# Patient Record
Sex: Female | Born: 1945 | Race: White | Hispanic: No | Smoking: Current every day smoker
Health system: Southern US, Community
[De-identification: ages and names within clinical notes are randomized; demographics above are authoritative.]

## PROBLEM LIST (undated history)

## (undated) DIAGNOSIS — F319 Bipolar disorder, unspecified: Secondary | ICD-10-CM

## (undated) DIAGNOSIS — E119 Type 2 diabetes mellitus without complications: Secondary | ICD-10-CM

## (undated) DIAGNOSIS — R32 Unspecified urinary incontinence: Secondary | ICD-10-CM

## (undated) DIAGNOSIS — F329 Major depressive disorder, single episode, unspecified: Secondary | ICD-10-CM

## (undated) DIAGNOSIS — F32A Depression, unspecified: Secondary | ICD-10-CM

## (undated) DIAGNOSIS — J441 Chronic obstructive pulmonary disease with (acute) exacerbation: Secondary | ICD-10-CM

## (undated) DIAGNOSIS — F0390 Unspecified dementia without behavioral disturbance: Secondary | ICD-10-CM

## (undated) DIAGNOSIS — F419 Anxiety disorder, unspecified: Secondary | ICD-10-CM

## (undated) HISTORY — DX: Unspecified dementia, unspecified severity, without behavioral disturbance, psychotic disturbance, mood disturbance, and anxiety: F03.90

## (undated) HISTORY — PX: CERVIX SURGERY: SHX593

## (undated) HISTORY — DX: Depression, unspecified: F32.A

## (undated) HISTORY — DX: Major depressive disorder, single episode, unspecified: F32.9

## (undated) HISTORY — DX: Unspecified urinary incontinence: R32

## (undated) HISTORY — DX: Type 2 diabetes mellitus without complications: E11.9

## (undated) HISTORY — DX: Anxiety disorder, unspecified: F41.9

## (undated) HISTORY — DX: Chronic obstructive pulmonary disease with (acute) exacerbation: J44.1

## (undated) HISTORY — DX: Bipolar disorder, unspecified: F31.9

---

## 2005-01-16 ENCOUNTER — Emergency Department: Payer: Self-pay | Admitting: Emergency Medicine

## 2005-01-16 IMAGING — CT CT HEAD WITHOUT CONTRAST
2 series · 16 of 30 positions shown, 20 images · non-contrast
Comparison: none

REASON FOR EXAM: Headache
COMMENTS:

[Series 2: without · axial · non-contrast · 0.41mm/px · z∈[-118,-3]mm · 13 of 27 slices shown, 17 images]
[im 2/27  brain]
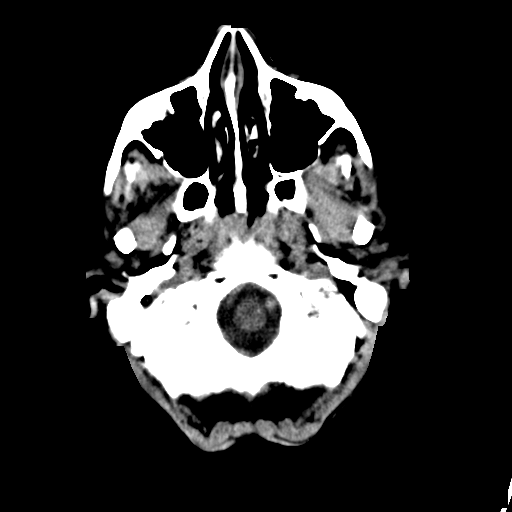
[im 2/27  bone]
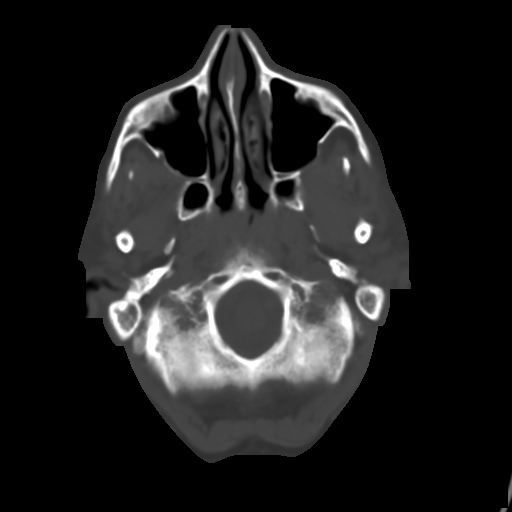
[im 4/27  brain]
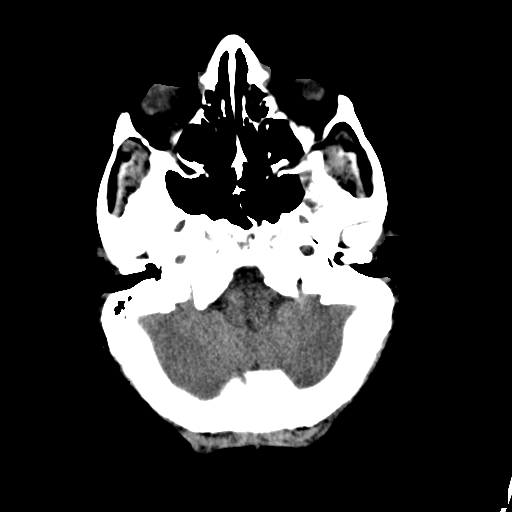
[im 6/27  brain]
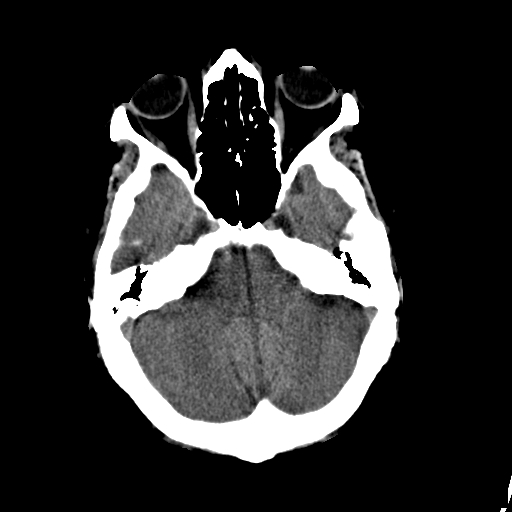
[im 8/27  brain]
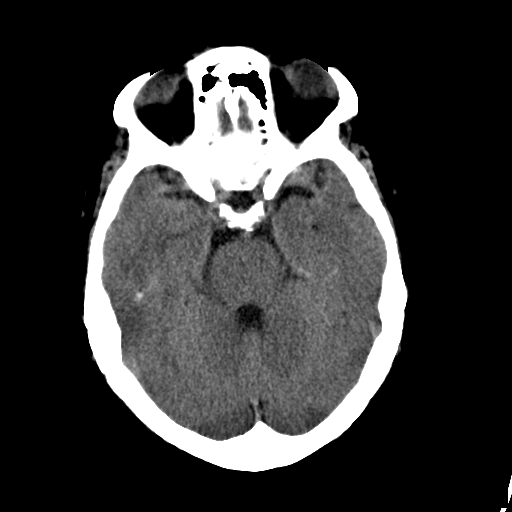
[im 10/27  brain]
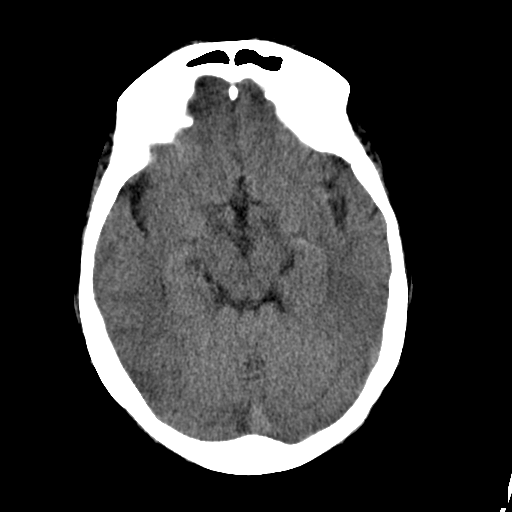
[im 10/27  bone]
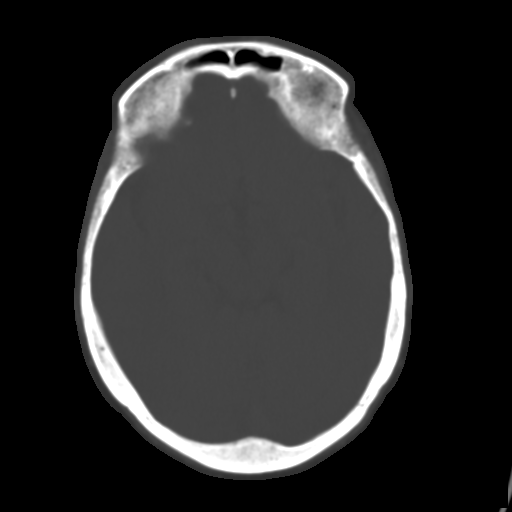
[im 12/27  brain]
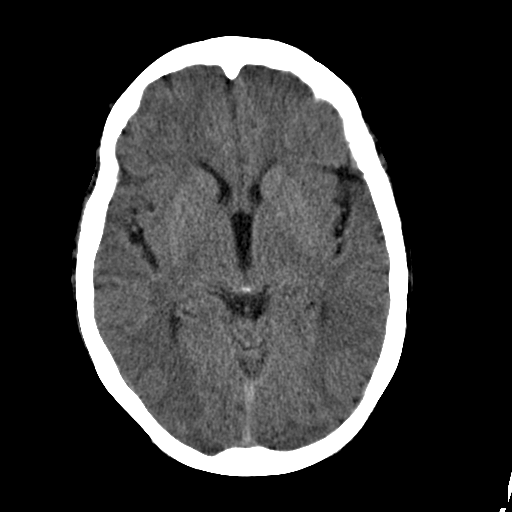
[im 14/27  brain]
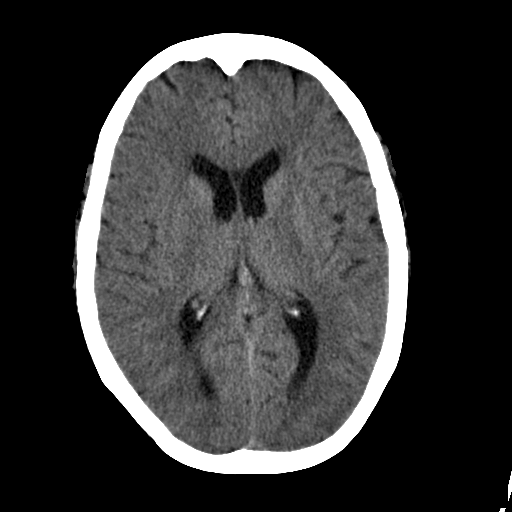
[im 15/27  brain]
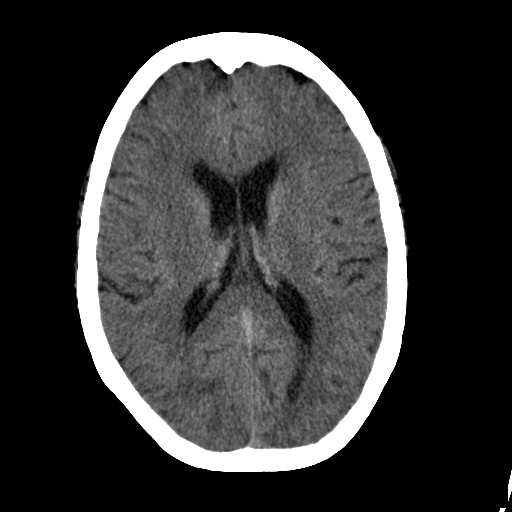
[im 17/27  brain]
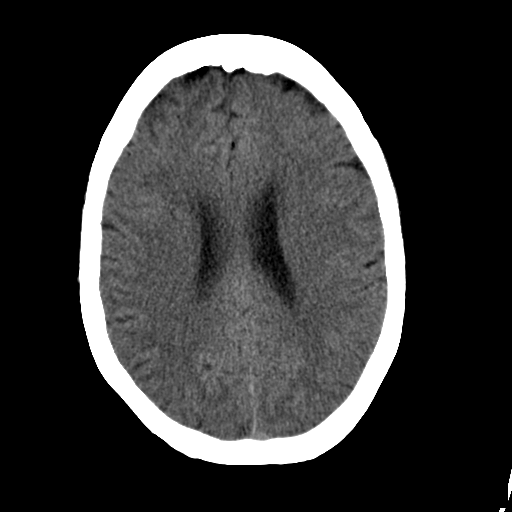
[im 17/27  bone]
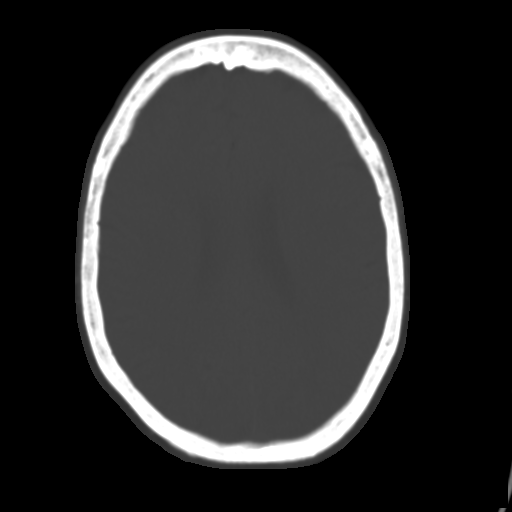
[im 19/27  brain]
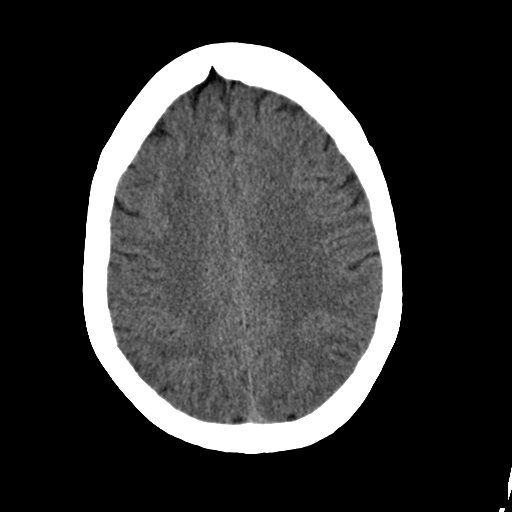
[im 21/27  brain]
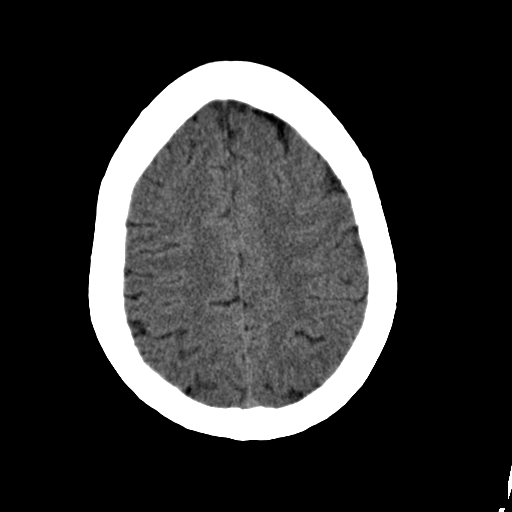
[im 23/27  brain]
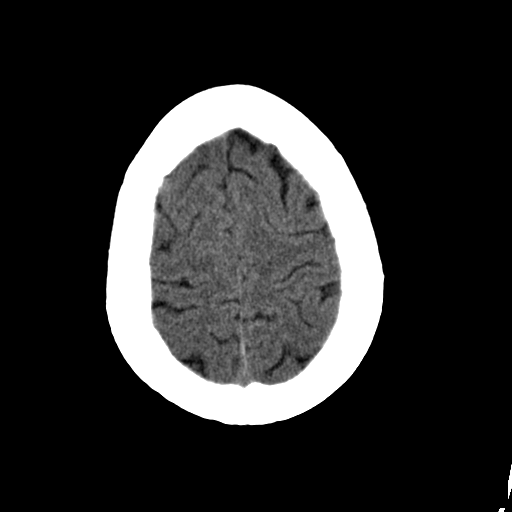
[im 25/27  brain]
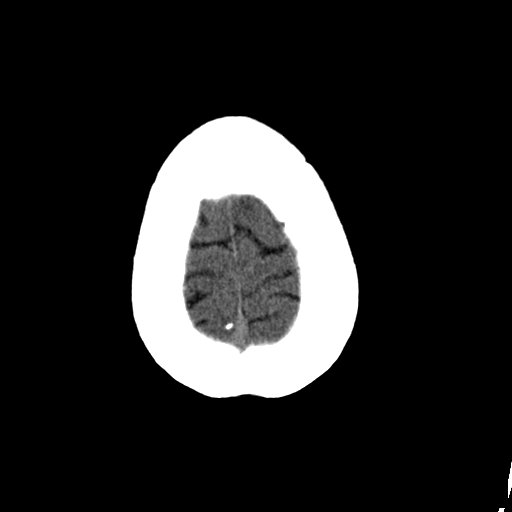
[im 25/27  bone]
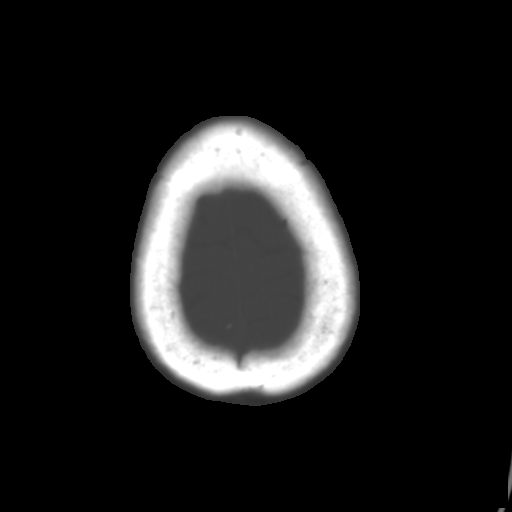

[Series 3: bone · axial · 0.41mm/px · z∈[-118,-78]mm · 3 of 27 slices shown]
[im 2/27  bone]
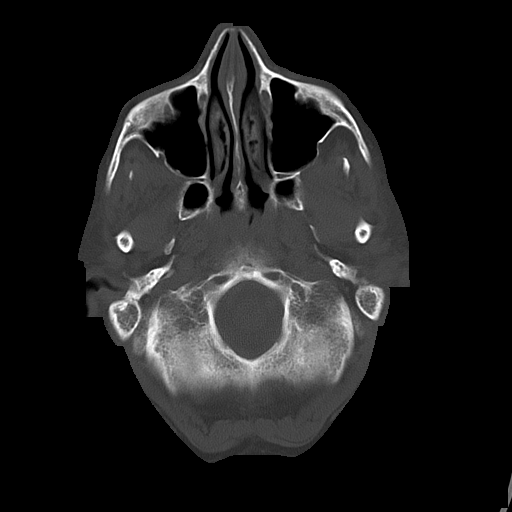
[im 6/27  bone]
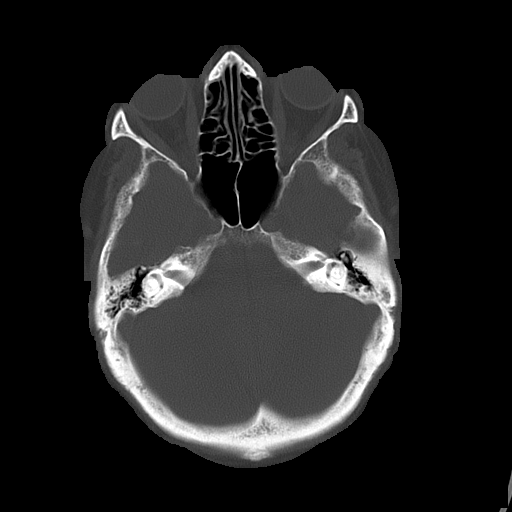
[im 10/27  bone]
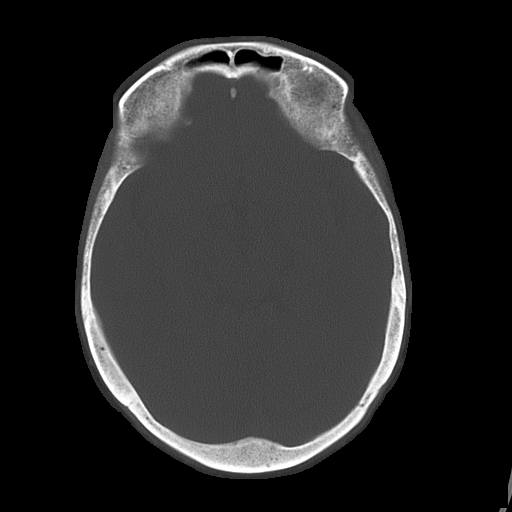

[16 of 30 positions shown; findings below may reference images not displayed]

PROCEDURE:     CT  - CT HEAD WITHOUT CONTRAST  - [DATE] [DATE]

RESULT:          There is no evidence of intraaxial nor extraaxial fluid
collections nor evidence of acute hemorrhage.  No secondary signs are
appreciated to suggest mass effect, subacute or chronic infarction.  The
visualized bony skeleton is unremarkable.
IMPRESSION: 1.     No evidence of acute abnormalities.
2.     Dr. AAITTEY, of the emergency department, was informed of these findings
at the time of the initial interpretation.

## 2005-06-09 ENCOUNTER — Emergency Department: Payer: Self-pay | Admitting: Emergency Medicine

## 2005-06-10 ENCOUNTER — Emergency Department: Payer: Self-pay | Admitting: Internal Medicine

## 2007-10-25 ENCOUNTER — Ambulatory Visit: Payer: Self-pay | Admitting: Internal Medicine

## 2007-11-08 ENCOUNTER — Ambulatory Visit: Payer: Self-pay | Admitting: Internal Medicine

## 2008-04-24 ENCOUNTER — Ambulatory Visit: Payer: Self-pay | Admitting: Internal Medicine

## 2009-04-29 ENCOUNTER — Ambulatory Visit: Payer: Self-pay | Admitting: Internal Medicine

## 2014-07-05 ENCOUNTER — Other Ambulatory Visit: Payer: Self-pay | Admitting: Nurse Practitioner

## 2014-07-05 DIAGNOSIS — Z1231 Encounter for screening mammogram for malignant neoplasm of breast: Secondary | ICD-10-CM

## 2014-07-24 ENCOUNTER — Ambulatory Visit
Admission: RE | Admit: 2014-07-24 | Discharge: 2014-07-24 | Disposition: A | Discharge status: Home / Self Care | Payer: Medicare Other | Source: Ambulatory Visit | Attending: Nurse Practitioner | Admitting: Nurse Practitioner

## 2014-07-24 ENCOUNTER — Other Ambulatory Visit: Payer: Self-pay | Admitting: Nurse Practitioner

## 2014-07-24 DIAGNOSIS — Z1231 Encounter for screening mammogram for malignant neoplasm of breast: Secondary | ICD-10-CM | POA: Diagnosis present

## 2014-09-17 ENCOUNTER — Encounter: Payer: Self-pay | Admitting: Psychiatry

## 2014-09-17 ENCOUNTER — Ambulatory Visit (INDEPENDENT_AMBULATORY_CARE_PROVIDER_SITE_OTHER): Payer: Medicare Other | Admitting: Psychiatry

## 2014-09-17 VITALS — BP 134/80 | HR 94 | Temp 97.3°F | Ht 61.0 in | Wt 108.8 lb

## 2014-09-17 DIAGNOSIS — F3130 Bipolar disorder, current episode depressed, mild or moderate severity, unspecified: Secondary | ICD-10-CM | POA: Diagnosis not present

## 2014-09-17 MED ORDER — BUPROPION HCL ER (XL) 150 MG PO TB24: 150.0000 mg | ORAL_TABLET | ORAL | Status: DC
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
## 2014-10-15 ENCOUNTER — Ambulatory Visit: Payer: Self-pay | Admitting: Psychiatry

## 2014-10-25 ENCOUNTER — Ambulatory Visit: Payer: Self-pay | Admitting: Psychiatry

## 2014-10-30 ENCOUNTER — Ambulatory Visit (INDEPENDENT_AMBULATORY_CARE_PROVIDER_SITE_OTHER): Payer: Medicare Other | Admitting: Psychiatry

## 2014-10-30 ENCOUNTER — Encounter: Payer: Self-pay | Admitting: Psychiatry

## 2014-10-30 VITALS — BP 110/86 | HR 97 | Temp 97.5°F | Ht 61.0 in | Wt 110.0 lb

## 2014-10-30 DIAGNOSIS — F3130 Bipolar disorder, current episode depressed, mild or moderate severity, unspecified: Secondary | ICD-10-CM

## 2014-10-30 MED ORDER — BUPROPION HCL ER (XL) 150 MG PO TB24: 150.0000 mg | ORAL_TABLET | ORAL | Status: DC

## 2014-10-30 NOTE — Progress Notes (Signed)
BH MD/PA/NP OP Progress Note  10/30/2014 2:26 PM Miranda Ryan  MRN:  [id number]  Subjective:  Patient returns for follow-up of a past diagnoses of bipolar disorder most recent episode depressed. However per my initial evaluation noted appears years since she's had any hospitalizations or issues with mania. She presented at the initial plan with her daughter and her daughter and patient endorsed depression. Patient returns with her son Abe People. Patient initially stated she felt the same. She rated her depression all as a 3 out of 10 in intensity. And she stated this is probably what it was like when she came in for initial evaluation. However when I bring the son in with her permission he indicates he feels like over the past 2 weeks she's been more up and about. He's noted she's been a little more conversant. He states he spoke in more with her in the past 2 weeks than he did the whole entire previous month. Patient states she feels like her energy is good and she is sleeping well. She denies any side effects from the medication. Chief Complaint: None Chief Complaint    Follow-up; Medication Refill     Visit Diagnosis:     ICD-9-CM ICD-10-CM   1. Bipolar I disorder, most recent episode depressed 296.50 F31.30 buPROPion (WELLBUTRIN XL) 150 MG 24 hr tablet    Past Medical History:  Past Medical History  Diagnosis Date  . Bipolar disorder   . Anxiety   . Depression   . Diabetes mellitus, type II     Past Surgical History  Procedure Laterality Date  . Cervix surgery     Family History:  Family History  Problem Relation Age of Onset  . Liver cancer Mother   . Heart attack Father   . Heart disease Sister   . Heart disease Brother   . Heart disease Brother   . Heart disease Brother    Social History:  Social History   Social History  . Marital Status: Married    Spouse Name: N/A  . Number of Children: N/A  . Years of Education: N/A   Social History Main Topics  . Smoking  status: Current Every Day Smoker -- 1.00 packs/day    Types: Cigarettes    Start date: 09/16/1964  . Smokeless tobacco: Never Used  . Alcohol Use: No  . Drug Use: No  . Sexual Activity: No   Other Topics Concern  . None   Social History Narrative   Additional History:   Assessment:   Musculoskeletal: Strength & Muscle Tone: within normal limits Gait & Station: normal Patient leans: N/A  Psychiatric Specialty Exam: HPI  Review of Systems  Psychiatric/Behavioral: Positive for depression. Negative for suicidal ideas, hallucinations, memory loss and substance abuse. The patient is not nervous/anxious and does not have insomnia.     Blood pressure 110/86, pulse 97, temperature 97.5 F (36.4 C), temperature source Tympanic, height 5\' 1"  (1.549 m), weight 110 lb (49.896 kg), SpO2 96 %.Body mass index is 20.8 kg/(m^2).  General Appearance: Fairly Groomed  Eye Contact:  Good  Speech:  Normal Rate  Volume:  Normal  Mood:  Good  Affect:  Bright, smiling  Thought Process:  Linear and Logical  Orientation:  Full (Time, Place, and Person)  Thought Content:  Negative  Suicidal Thoughts:  No  Homicidal Thoughts:  No  Memory:  Immediate;   Fair Recent;   Fair Remote;   Good  Judgement:  Good  Insight:  Good  Psychomotor Activity:  Negative  Concentration:  Good  Recall:  Good  Fund of Knowledge: Good  Language: Good  Akathisia:  Negative  Handed:  Right unknown  AIMS (if indicated):  N/A  Assets:  Communication Skills Desire for Improvement Social Support  ADL's:  Intact  Cognition: WNL  Sleep:  good   Is the patient at risk to self?  No. Has the patient been a risk to self in the past 6 months?  No. Has the patient been a risk to self within the distant past?  No. Is the patient a risk to others?  No. Has the patient been a risk to others in the past 6 months?  No. Has the patient been a risk to others within the distant past?  No.  Current Medications: Current  Outpatient Prescriptions  Medication Sig Dispense Refill  . buPROPion (WELLBUTRIN XL) 150 MG 24 hr tablet Take 1 tablet (150 mg total) by mouth every morning. 30 tablet 1  . JANUVIA 25 MG tablet   0   No current facility-administered medications for this visit.    Medical Decision Making:  Established Problem, Stable/Improving (1)  Treatment Plan Summary:Medication management and Plan At this time patient reports she is feeling okay. Son provides some collateral that there may be some improvement. Addition she does seem bright and smiling at this appointment. We will continue the patient on Wellbutrin XL 150 mg in the morning without any changes. She'll follow up in 1 month and we can continue to assess for any adjustments.   Faith Rogue 10/30/2014, 2:26 PM

## 2014-11-25 ENCOUNTER — Ambulatory Visit: Payer: Self-pay | Admitting: Psychiatry

## 2014-12-06 ENCOUNTER — Ambulatory Visit (INDEPENDENT_AMBULATORY_CARE_PROVIDER_SITE_OTHER): Payer: Medicare Other | Admitting: Psychiatry

## 2014-12-06 ENCOUNTER — Encounter: Payer: Self-pay | Admitting: Psychiatry

## 2014-12-06 VITALS — BP 118/84 | HR 84 | Temp 97.9°F | Ht 61.0 in | Wt 112.8 lb

## 2014-12-06 DIAGNOSIS — F3130 Bipolar disorder, current episode depressed, mild or moderate severity, unspecified: Secondary | ICD-10-CM

## 2014-12-06 MED ORDER — BUPROPION HCL ER (XL) 150 MG PO TB24: 150.0000 mg | ORAL_TABLET | ORAL | Status: DC

## 2014-12-06 NOTE — Progress Notes (Signed)
BH MD/PA/NP OP Progress Note  12/06/2014 10:34 AM Miranda Ryan  MRN:  [id number]  Subjective:  Patient returns for follow-up of a past diagnoses of bipolar disorder most recent episode depressed. She presents alone today on just the past appointments where she had her son and/or daughter with her. She states overall things are going good and her mood is good. She states she spends time around the house cleaning. She states she does not do much otherwise. She states that her appetite and her sleep are good. She states she continues take her medication.   She was able to joke and laugh in the appointment. When asked about what she does she indicates she does take care of her dogs. She states they are beagles and she joked and asked writer if I wanted 1. Chief Complaint: None Chief Complaint    Follow-up; Medication Reaction     Visit Diagnosis:     ICD-9-CM ICD-10-CM   1. Bipolar I disorder, most recent episode depressed ([place]) 296.50 F31.30 buPROPion (WELLBUTRIN XL) 150 MG 24 hr tablet    Past Medical History:  Past Medical History  Diagnosis Date  . Bipolar disorder ([place])   . Anxiety   . Depression   . Diabetes mellitus, type II [facility])     Past Surgical History  Procedure Laterality Date  . Cervix surgery     Family History:  Family History  Problem Relation Age of Onset  . Liver cancer Mother   . Heart attack Father   . Heart disease Sister   . Heart disease Brother   . Heart disease Brother   . Heart disease Brother    Social History:  Social History   Social History  . Marital Status: Married    Spouse Name: N/A  . Number of Children: N/A  . Years of Education: N/A   Social History Main Topics  . Smoking status: Current Every Day Smoker -- 1.00 packs/day    Types: Cigarettes    Start date: 09/16/1964  . Smokeless tobacco: Never Used  . Alcohol Use: No  . Drug Use: No  . Sexual Activity: No   Other Topics Concern  . None   Social History Narrative    Additional History:   Assessment:   Musculoskeletal: Strength & Muscle Tone: within normal limits Gait & Station: normal Patient leans: N/A  Psychiatric Specialty Exam: HPI  Review of Systems  Psychiatric/Behavioral: Negative for depression, suicidal ideas, hallucinations, memory loss and substance abuse. The patient is not nervous/anxious and does not have insomnia.   All other systems reviewed and are negative.   Blood pressure 118/84, pulse 119, temperature 97.9 F (36.6 C), temperature source Tympanic, height 5\' 1"  (1.549 m), weight 112 lb 12.8 oz (51.166 kg), SpO2 98 %.Body mass index is 21.32 kg/(m^2).  General Appearance: Fairly Groomed  Eye Contact:  Good  Speech:  Normal Rate  Volume:  Normal  Mood:  Good  Affect:  Bright, smiling  Thought Process:  Linear and Logical  Orientation:  Full (Time, Place, and Person)  Thought Content:  Negative  Suicidal Thoughts:  No  Homicidal Thoughts:  No  Memory:  Immediate;   Fair Recent;   Fair Remote;   Good  Judgement:  Good  Insight:  Good  Psychomotor Activity:  Negative  Concentration:  Good  Recall:  Good  Fund of Knowledge: Good  Language: Good  Akathisia:  Negative  Handed:  Right unknown  AIMS (if indicated):  N/A  Assets:  Communication Skills Desire for Improvement Social Support  ADL's:  Intact  Cognition: WNL  Sleep:  good  Patient was able to state it was "October". She was able to state it was the middle of October. She was aware it was "Friday." She was able to immediately name 3 things and recall all 3 after 3 minutes. Is the patient at risk to self?  No. Has the patient been a risk to self in the past 6 months?  No. Has the patient been a risk to self within the distant past?  No. Is the patient a risk to others?  No. Has the patient been a risk to others in the past 6 months?  No. Has the patient been a risk to others within the distant past?  No.  Current Medications: Current Outpatient  Prescriptions  Medication Sig Dispense Refill  . buPROPion (WELLBUTRIN XL) 150 MG 24 hr tablet Take 1 tablet (150 mg total) by mouth every morning. 30 tablet 3  . JANUVIA 25 MG tablet   0   No current facility-administered medications for this visit.    Medical Decision Making:  Established Problem, Stable/Improving (1)  Treatment Plan Summary:Medication management and Plan  Bipolar disorder, most recent episode depressed. Also last appointment patient seems to report a good mood. There is no signs or symptoms of mania or hypomania. We will continue the patient on Wellbutrin XL 150 mg in the morning without any changes. She'll follow up in 1 month and we can continue to assess for any adjustments.   Faith Rogue 12/06/2014, 10:34 AM

## 2015-03-05 ENCOUNTER — Encounter: Payer: Self-pay | Admitting: Psychiatry

## 2015-03-05 ENCOUNTER — Ambulatory Visit (INDEPENDENT_AMBULATORY_CARE_PROVIDER_SITE_OTHER): Payer: Medicare Other | Admitting: Psychiatry

## 2015-03-05 VITALS — BP 110/76 | HR 106 | Temp 97.5°F | Ht 61.0 in | Wt 118.2 lb

## 2015-03-05 DIAGNOSIS — F3130 Bipolar disorder, current episode depressed, mild or moderate severity, unspecified: Secondary | ICD-10-CM

## 2015-03-05 MED ORDER — BUPROPION HCL ER (XL) 150 MG PO TB24: 150.0000 mg | ORAL_TABLET | ORAL | Status: DC

## 2015-03-05 NOTE — Progress Notes (Signed)
BH MD/PA/NP OP Progress Note  03/05/2015 11:07 AM Miranda Ryan  MRN:  [id number]  Subjective:  Patient returns for follow-up of a past diagnoses of bipolar disorder most recent episode depressed. She comes in to the office unaccompanied today. She states things are going well. She states that she does not do much but take care of her 4 dogs. She states she is able to enjoy that. She states her sleep is about 5 or 6 hours a night and that her appetite has been good. She denies any disturbing thoughts and adamantly denies any suicidal ideation.  He may be medication compliance. She states she's not taking any medications. When I reminded her also prescribing her medication for depression she indicated she ran out. I reviewed the prescriptions and she should've had enough medications through February per the last prescription written in October 2016. I asked her who helps her fill her medications and she stated her son or her daughter.  Chief Complaint: None Chief Complaint    Follow-up; Medication Refill     Visit Diagnosis:     ICD-9-CM ICD-10-CM   1. Bipolar I disorder, most recent episode depressed ([place]) 296.50 F31.30 buPROPion (WELLBUTRIN XL) 150 MG 24 hr tablet    Past Medical History:  Past Medical History  Diagnosis Date  . Bipolar disorder ([place])   . Anxiety   . Depression   . Diabetes mellitus, type II [facility])     Past Surgical History  Procedure Laterality Date  . Cervix surgery     Family History:  Family History  Problem Relation Age of Onset  . Liver cancer Mother   . Heart attack Father   . Heart disease Sister   . Heart disease Brother   . Heart disease Brother   . Heart disease Brother    Social History:  Social History   Social History  . Marital Status: Married    Spouse Name: N/A  . Number of Children: N/A  . Years of Education: N/A   Social History Main Topics  . Smoking status: Current Every Day Smoker -- 1.00 packs/day    Types: Cigarettes   Start date: 09/16/1964  . Smokeless tobacco: Never Used  . Alcohol Use: No  . Drug Use: No  . Sexual Activity: No   Other Topics Concern  . None   Social History Narrative   Additional History:   Assessment:   Musculoskeletal: Strength & Muscle Tone: within normal limits Gait & Station: normal Patient leans: N/A  Psychiatric Specialty Exam: HPI  Review of Systems  Psychiatric/Behavioral: Negative for depression, suicidal ideas, hallucinations, memory loss and substance abuse. The patient is not nervous/anxious and does not have insomnia.   All other systems reviewed and are negative.   Blood pressure 110/76, pulse 106, temperature 97.5 F (36.4 C), temperature source Tympanic, height 5\' 1"  (1.549 m), weight 118 lb 3.2 oz (53.615 kg), SpO2 99 %.Body mass index is 22.35 kg/(m^2).  General Appearance: Fairly Groomed  Eye Contact:  Good  Speech:  Normal Rate  Volume:  Normal  Mood:  Good  Affect:  Bright, smiling she did seem to joke when I explained my departure from the practice responding "no you're not"   Thought Process:  Linear and Logical  Orientation:  Full (Time, Place, and Person)  Thought Content:  Negative  Suicidal Thoughts:  No  Homicidal Thoughts:  No  Memory:  Immediate;   Fair Recent;   Fair Remote;   Good  Judgement:  Good  Insight:  Good  Psychomotor Activity:  Negative  Concentration:  Good  Recall:  Good  Fund of Knowledge: Good  Language: Good  Akathisia:  Negative  Handed:  Right unknown  AIMS (if indicated):  N/A  Assets:  Communication Skills Desire for Improvement Social Support  ADL's:  Intact  Cognition: WNL  Sleep:  good  Patient was able to state it was "October". She was able to state it was the middle of October. She was aware it was "Friday." She was able to immediately name 3 things and recall all 3 after 3 minutes. Is the patient at risk to self?  No. Has the patient been a risk to self in the past 6 months?  No. Has the  patient been a risk to self within the distant past?  No. Is the patient a risk to others?  No. Has the patient been a risk to others in the past 6 months?  No. Has the patient been a risk to others within the distant past?  No.  Current Medications: Current Outpatient Prescriptions  Medication Sig Dispense Refill  . buPROPion (WELLBUTRIN XL) 150 MG 24 hr tablet Take 1 tablet (150 mg total) by mouth every morning. 30 tablet 4  . JANUVIA 25 MG tablet Reported on 03/05/2015  0   No current facility-administered medications for this visit.    Medical Decision Making:  Established Problem, Stable/Improving (1)  Treatment Plan Summary:Medication management and Plan  Bipolar disorder, most recent episode depressed. Also last appointment patient seems to report a good mood. There is no signs or symptoms of mania or hypomania. We will continue the patient on Wellbutrin XL 150 mg in the morning without any changes. However does appear the patient has now been off of the medication for some period of time. It may be that she's not aware of refills. She indicated she has her daughter in the car and thus I printed her after visit summary and explained on it that she has medication for her at her pharmacy.  Patient my departure next month and that she would be able to follow-up with another psychiatrist in this clinic. I've instructed her to make her next appointment for 3 months as she has been stable and we are not changing medications. Encouraged call any questions or concerns prior to her next appointment.  Faith Rogue 03/05/2015, 11:07 AM

## 2015-06-03 ENCOUNTER — Ambulatory Visit: Payer: Medicare Other | Admitting: Psychiatry

## 2015-06-11 ENCOUNTER — Ambulatory Visit (INDEPENDENT_AMBULATORY_CARE_PROVIDER_SITE_OTHER): Payer: Medicare Other | Admitting: Psychiatry

## 2015-11-29 ENCOUNTER — Encounter: Payer: Self-pay | Admitting: Emergency Medicine

## 2015-11-29 ENCOUNTER — Emergency Department: Payer: Medicare Other

## 2015-11-29 ENCOUNTER — Emergency Department
Admission: EM | Admit: 2015-11-29 | Discharge: 2015-11-29 | Disposition: A | Discharge status: Home / Self Care | Payer: Medicare Other | Attending: Student in an Organized Health Care Education/Training Program | Admitting: Student in an Organized Health Care Education/Training Program

## 2015-11-29 DIAGNOSIS — F17210 Nicotine dependence, cigarettes, uncomplicated: Secondary | ICD-10-CM | POA: Insufficient documentation

## 2015-11-29 DIAGNOSIS — E119 Type 2 diabetes mellitus without complications: Secondary | ICD-10-CM | POA: Insufficient documentation

## 2015-11-29 DIAGNOSIS — F17200 Nicotine dependence, unspecified, uncomplicated: Secondary | ICD-10-CM

## 2015-11-29 DIAGNOSIS — R05 Cough: Secondary | ICD-10-CM | POA: Diagnosis present

## 2015-11-29 DIAGNOSIS — J209 Acute bronchitis, unspecified: Secondary | ICD-10-CM | POA: Insufficient documentation

## 2015-11-29 IMAGING — CR DG CHEST 2V
1 series · 2 of 2 positions shown · non-contrast
Comparison: None.

CLINICAL DATA: Cough.  Smoker.

EXAM:
CHEST  2 VIEW

[Series 1: dg chest 2 view · 0.14mm/px · 2 of 2 slices shown]
[im 1/2]
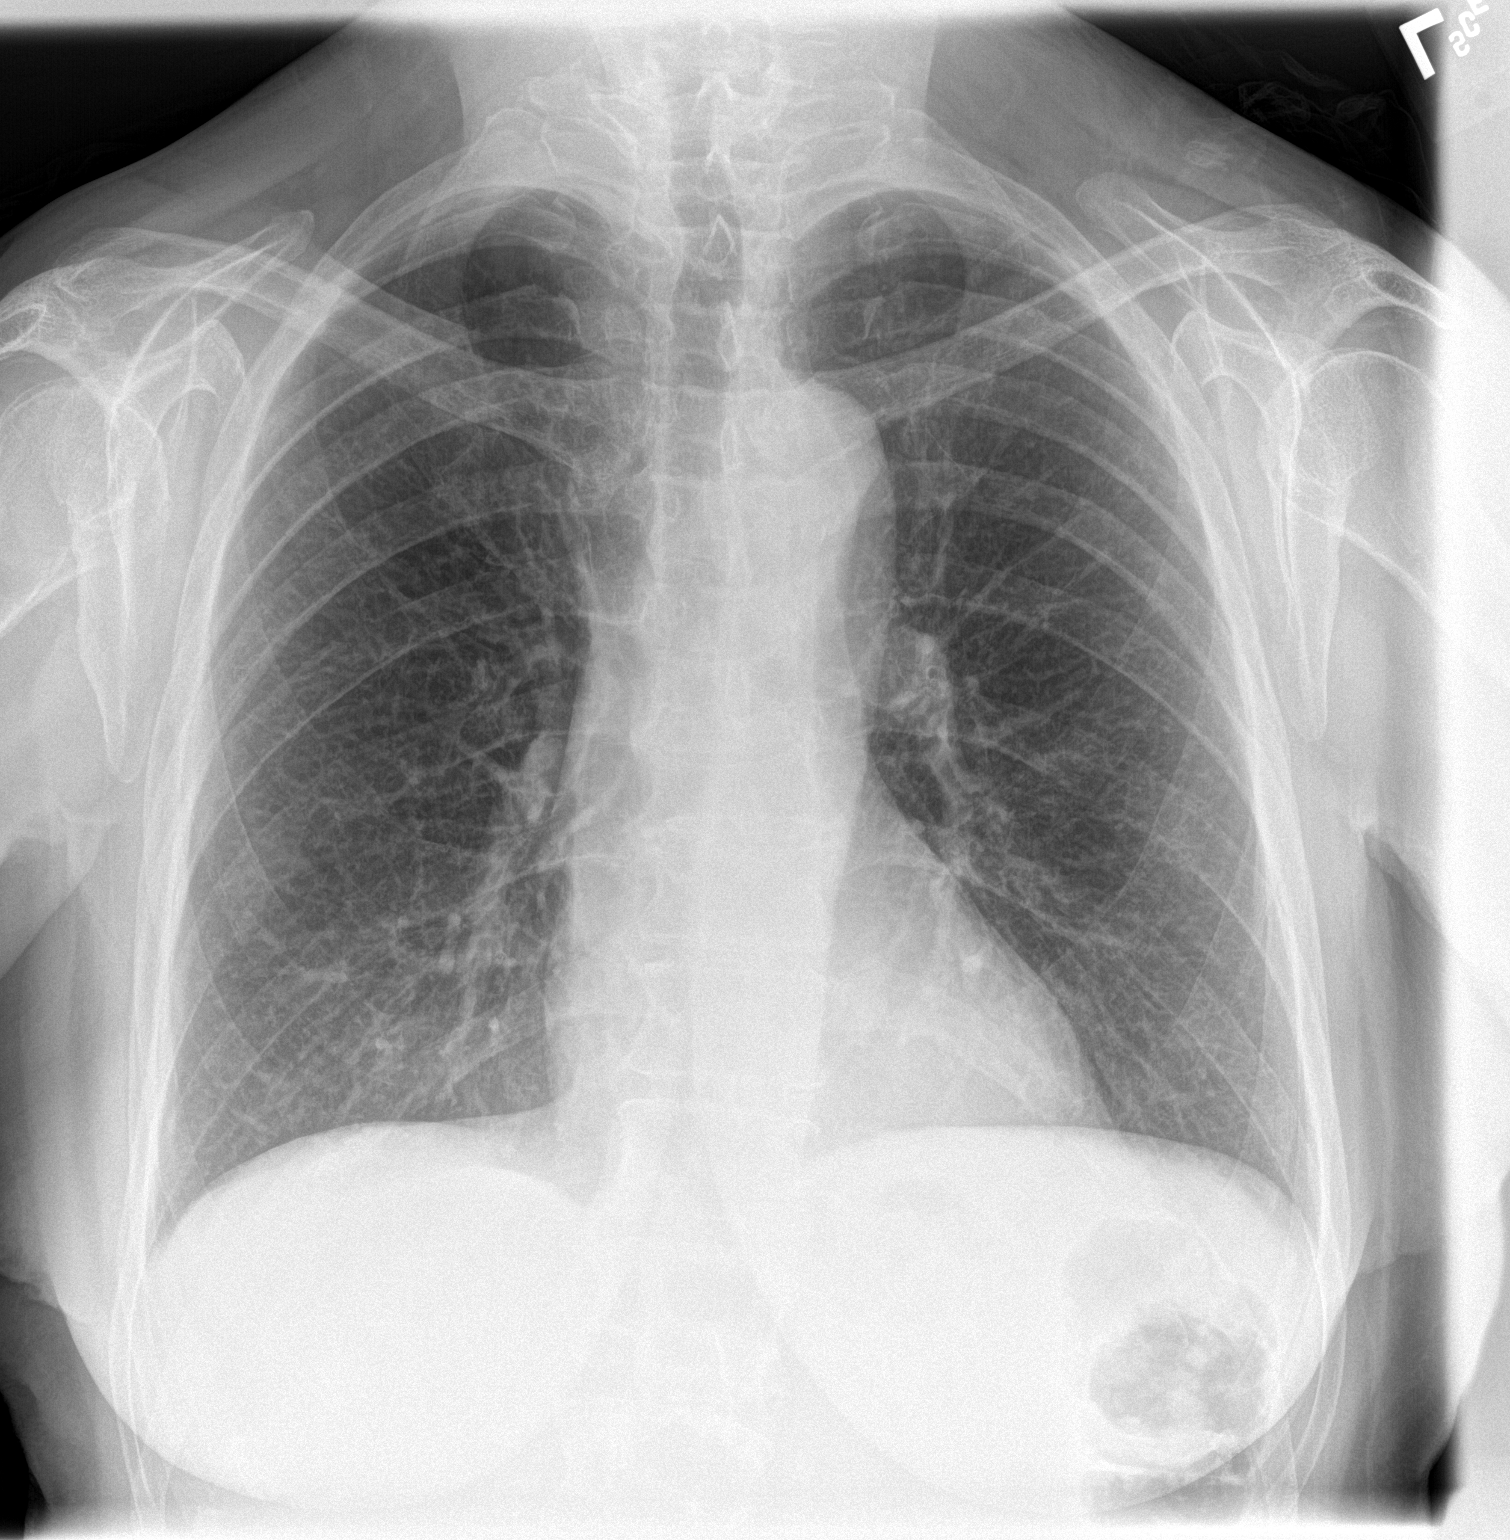
[im 2/2]
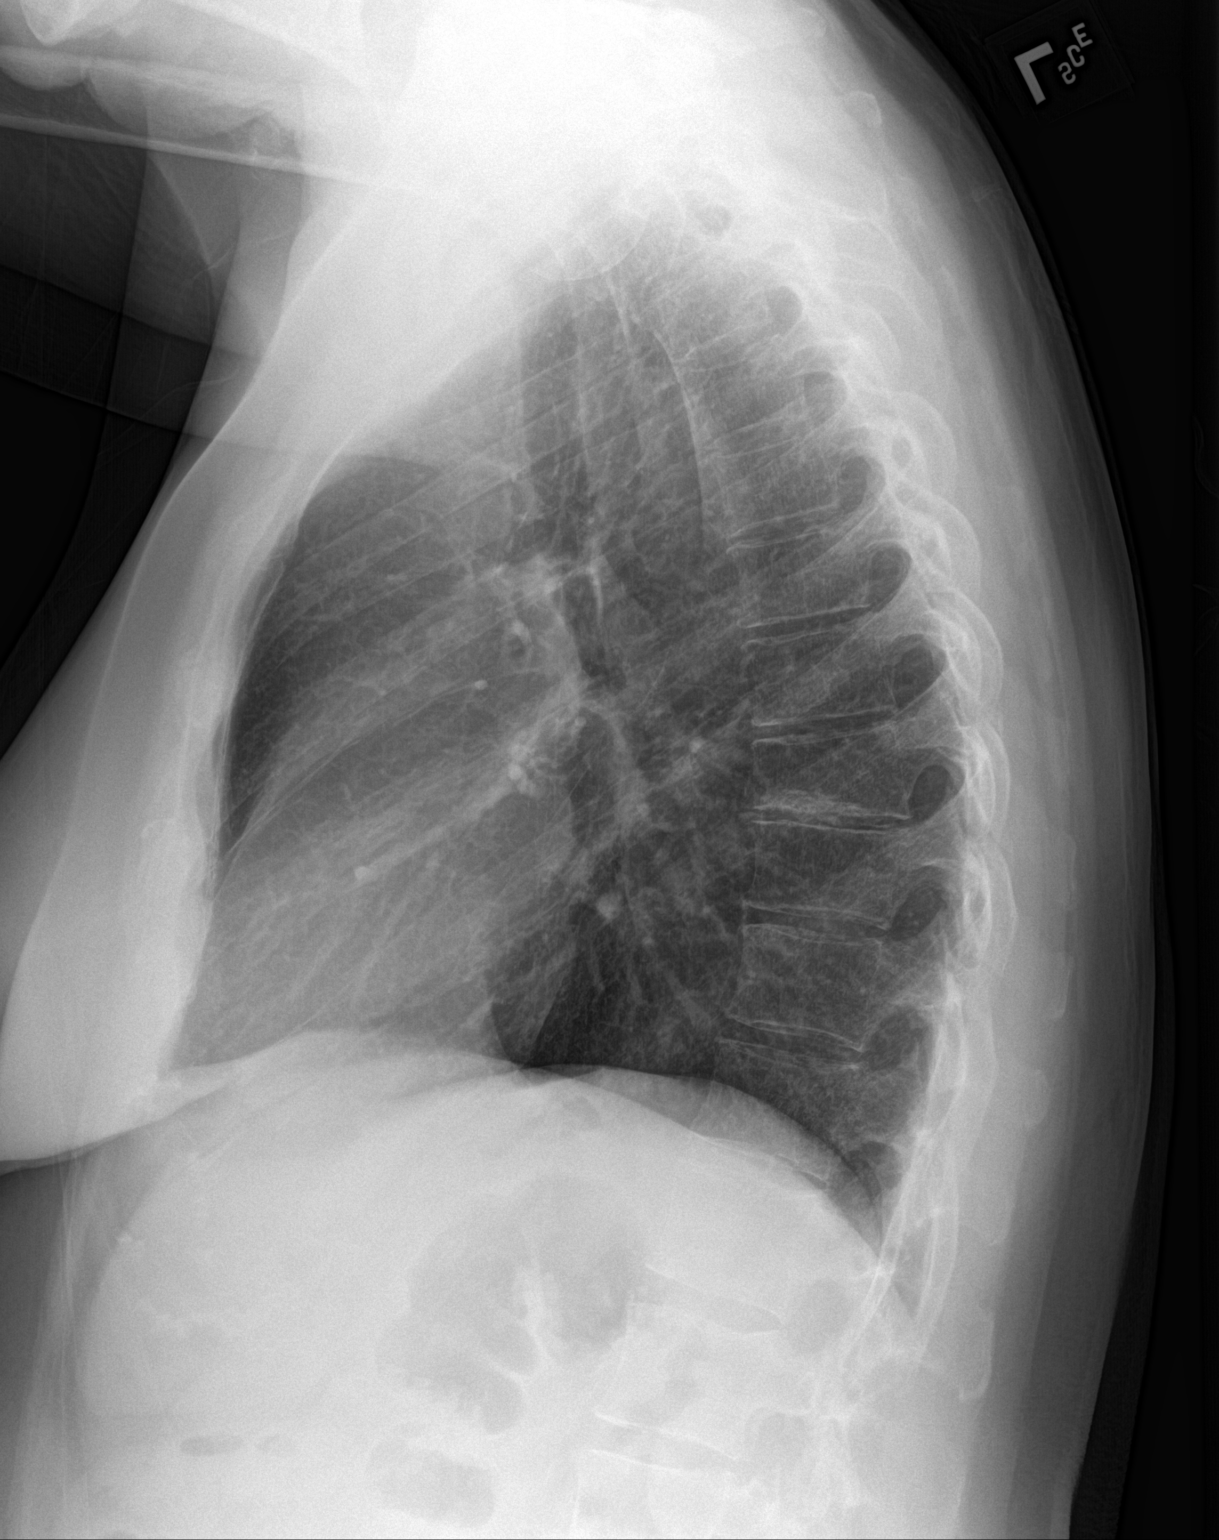

[2 of 2 positions shown; findings below may reference images not displayed]

FINDINGS: The heart size and mediastinal contours are within normal limits.
Lung volumes are normal. Mild pulmonary interstitial prominence
throughout both lungs may be on the basis of chronic
disease/smoking. No overt pulmonary fibrosis. There is no evidence
of pulmonary edema, consolidation, pneumothorax, nodule or pleural
fluid. The visualized skeletal structures are unremarkable.
IMPRESSION: No active disease. Suspect mild chronic interstitial pulmonary
disease.

## 2015-11-29 MED ORDER — BENZONATATE 100 MG PO CAPS: ORAL_CAPSULE | ORAL | 0 refills | Status: DC

## 2015-11-29 MED ORDER — AZITHROMYCIN 250 MG PO TABS: ORAL_TABLET | ORAL | 0 refills | Status: DC

## 2015-11-29 NOTE — ED Triage Notes (Addendum)
Pt c/o dry cough X 2 weeks. Has had a slight runny nose. Denies fevers or any other symptoms at all. No respiratory distress in triage. Not tachypenic. No pulmonary hx.

## 2015-11-29 NOTE — ED Provider Notes (Signed)
[facility] Emergency Department Provider Note   ____________________________________________   First MD Initiated Contact with Patient 11/29/15 [phone]     (approximate)  I have reviewed the triage vital signs and the nursing notes.   HISTORY  Chief Complaint Cough   HPI SPECIAL Miranda Ryan is a 70 y.o. female is here with complaint of coughing nonproductively for approximately 2-3 weeks. Patient states she has slightly runny nose at times but otherwise no other symptoms. She denies any throat pain or ear pain. She is unaware of any fever or chills. She denies any chest pain or shortness breath. She is unaware of any COPD despite her history of smoking.He still continues to smoke one pack cigarettes per day. Patient has not taken any over-the-counter medication for cough during the 2-3 weeks that she has been sick. He denies any pain at this time.   Past Medical History:  Diagnosis Date  . Anxiety   . Bipolar disorder ([place])   . Depression   . Diabetes mellitus, type II ([place])     There are no active problems to display for this patient.   Past Surgical History:  Procedure Laterality Date  . CERVIX SURGERY      Prior to Admission medications   Medication Sig Start Date End Date Taking? Authorizing Provider  azithromycin (ZITHROMAX Z-PAK) 250 MG tablet Take 2 tablets (500 mg) on  Day 1,  followed by 1 tablet (250 mg) once daily on Days 2 through 5. 11/29/15   Johnn Hai, PA-C  benzonatate (TESSALON PERLES) 100 MG capsule 1-2 every 8 hours prn cough 11/29/15   Johnn Hai, PA-C  buPROPion (WELLBUTRIN XL) 150 MG 24 hr tablet Take 1 tablet (150 mg total) by mouth every morning. 03/05/15   Marjie Skiff, MD  JANUVIA 25 MG tablet Reported on 03/05/2015 10/23/14   Historical Provider, MD    Allergies Demerol [meperidine]  Family History  Problem Relation Age of Onset  . Liver cancer Mother   . Heart attack Father   . Heart disease Sister   .  Heart disease Brother   . Heart disease Brother   . Heart disease Brother     Social History Social History  Substance Use Topics  . Smoking status: Current Every Day Smoker    Packs/day: 1.00    Types: Cigarettes    Start date: 09/16/1964  . Smokeless tobacco: Never Used  . Alcohol use No    Review of Systems Constitutional: No fever/chills Eyes: No visual changes. ENT: No sore throat. Cardiovascular: Denies chest pain. Respiratory: Denies shortness of breath. Positive nonproductive cough. Gastrointestinal: No abdominal pain.  No nausea, no vomiting. Negative for diarrhea. Genitourinary: Negative for dysuria. Musculoskeletal: Negative for back pain. Skin: Negative for rash. Neurological: Negative for headaches, focal weakness or numbness.  10-point ROS otherwise negative.  ____________________________________________   PHYSICAL EXAM:  VITAL SIGNS: ED Triage Vitals  Enc Vitals Group     BP 11/29/15 0843 (!) 143/66     Pulse Rate 11/29/15 0843 97     Resp 11/29/15 0843 16     Temp 11/29/15 0842 97.9 F (36.6 C)     Temp Source 11/29/15 0842 Oral     SpO2 11/29/15 0843 99 %     Weight 11/29/15 0841 115 lb (52.2 kg)     Height 11/29/15 0841 5\' 2"  (1.575 m)     Head Circumference --      Peak Flow --  Pain Score --      Pain Loc --      Pain Edu? --      Excl. in [place]? --     Constitutional: Alert and oriented. Well appearing and in no acute distress. Eyes: Conjunctivae are normal. PERRL. EOMI. Head: Atraumatic. Nose: No congestion/rhinnorhea.  EACs are clear. TMs are dull bilaterally. Mouth/Throat: Mucous membranes are moist.  Oropharynx non-erythematous. Neck: No stridor.   Hematological/Lymphatic/Immunilogical: No cervical lymphadenopathy. Cardiovascular: Normal rate, regular rhythm. Grossly normal heart sounds.  Good peripheral circulation. Respiratory: Normal respiratory effort.  No retractions. Lungs CTAB. Gastrointestinal: Soft and nontender. No  distention. Bowel sounds are normoactive 4 quadrants. Musculoskeletal: Moves upper and lower extremities without any difficulty and normal gait was noted. Neurologic:  Normal speech and language. No gross focal neurologic deficits are appreciated. No gait instability. Skin:  Skin is warm, dry and intact. No rash noted. Psychiatric: Mood and affect are normal. Speech and behavior are normal.  ____________________________________________   LABS (all labs ordered are listed, but only abnormal results are displayed)  Labs Reviewed - No data to display   RADIOLOGY  Chest x-ray per radiologist: IMPRESSION:  No active disease. Suspect mild chronic interstitial pulmonary  disease.   I, Johnn Hai, personally viewed and evaluated these images (plain radiographs) as part of my medical decision making, as well as reviewing the written report by the radiologist. ____________________________________________   PROCEDURES  Procedure(s) performed: None  Procedures  Critical Care performed: No  ____________________________________________   INITIAL IMPRESSION / ASSESSMENT AND PLAN / ED COURSE  Pertinent labs & imaging results that were available during my care of the patient were reviewed by me and considered in my medical decision making (see chart for details).    Clinical Course   Patient was placed on Tessalon Perles 1 or 2 every 8 hours as needed for cough. She is also given a prescription for Z-Pak as directed. She is to follow-up with one of the clinics listed on her papers including [facility] as she does not have a primary care provider.   ____________________________________________   FINAL CLINICAL IMPRESSION(S) / ED DIAGNOSES  Final diagnoses:  Acute bronchitis, unspecified organism  Current every day smoker      NEW MEDICATIONS STARTED DURING THIS VISIT:  Discharge Medication List as of 11/29/2015 10:39 AM    START taking these medications    Details  azithromycin (ZITHROMAX Z-PAK) 250 MG tablet Take 2 tablets (500 mg) on  Day 1,  followed by 1 tablet (250 mg) once daily on Days 2 through 5., Print    benzonatate (TESSALON PERLES) 100 MG capsule 1-2 every 8 hours prn cough, Print         Note:  This document was prepared using Dragon voice recognition software and may include unintentional dictation errors.    Johnn Hai, PA-C 11/29/15 [place], MD 11/29/15 [phone]

## 2015-11-29 NOTE — ED Notes (Signed)
Pt stating that she came in "for a cold." Pt stating that she has been coughing a lot for the last 2 weeks. Pt stating that it is a dry cough. Denies n/v. Denies CP and SOB.

## 2015-11-29 NOTE — Discharge Instructions (Signed)
Follow-up with [facility], [facility], [facility], or [facility]. You need to call and make an appointment. Begin taking Tessalon as needed for cough. Zithromax in today.

## 2015-11-29 NOTE — ED Notes (Signed)
Patient transported back to room from xray.

## 2015-12-31 ENCOUNTER — Ambulatory Visit: Payer: Medicare Other | Admitting: Psychiatry

## 2016-01-01 ENCOUNTER — Ambulatory Visit (INDEPENDENT_AMBULATORY_CARE_PROVIDER_SITE_OTHER): Payer: Medicare Other | Admitting: Psychiatry

## 2016-01-01 ENCOUNTER — Encounter: Payer: Self-pay | Admitting: Psychiatry

## 2016-01-01 VITALS — BP 138/78 | HR 96 | Ht 61.25 in | Wt 143.0 lb

## 2016-01-01 DIAGNOSIS — F3130 Bipolar disorder, current episode depressed, mild or moderate severity, unspecified: Secondary | ICD-10-CM | POA: Diagnosis not present

## 2016-01-01 MED ORDER — BUPROPION HCL ER (SR) 100 MG PO TB12: 100.0000 mg | ORAL_TABLET | ORAL | 2 refills | Status: DC

## 2016-01-01 MED ORDER — BUPROPION HCL ER (SR) 100 MG PO TB12: 100.0000 mg | ORAL_TABLET | ORAL | Status: DC

## 2016-01-01 NOTE — Progress Notes (Signed)
Psychiatric Initial Adult Assessment   Patient Identification: Miranda Ryan MRN:  [id number] Date of Evaluation:  01/01/2016 Referral Source: Dr Jimmye Norman Chief Complaint:   Chief Complaint    Follow-up     Visit Diagnosis:    ICD-9-CM ICD-10-CM   1. Bipolar I disorder, most recent episode depressed ([place]) 296.50 F31.30     History of Present Illness:    Patient is a 70 year old female who presented for initial assessment accompanied by her daughter. She was last seen in January. Her daughter reported that patient has been compliant with her medication and is currently taking Wellbutrin XL 150 as prescribed by Dr. Jimmye Norman. She recently had the upper respiratory infection and was diagnosed with bronchitis. She is taking antibiotics. Her daughter also mentioned that she is having problems with sleep at night. She is up and down all night long and has been going to the bathroom on the frequent basis. Patient reported that she does not have any other acute issues. She was interactive during the interview. Patient reported that she does not think that she has any memory problems.   She is eating well. She denied having any paranoia. She denied having any suicidal homicidal ideations or plans. Her daughter remains supportive. Patient is currently living with her daughter and they are planning to sell the patient's house. Her daughter reported that she is also sad that they are planning to sell the patient's house as she also grew up in the same house.     Associated Signs/Symptoms: Depression Symptoms:  depressed mood, fatigue, (Hypo) Manic Symptoms:  denied Anxiety Symptoms:  denied Psychotic Symptoms:  denied PTSD Symptoms: Negative NA  Past Psychiatric History:  Admitted to [facility] in past. She was diagnosed with bipolar disorder many years ago. She has been compliant with her medications.   Previous Psychotropic Medications: Risperdal  Substance Abuse History in the last 12  months:  No.  Consequences of Substance Abuse: Negative NA  Past Medical History:  Past Medical History:  Diagnosis Date  . Anxiety   . Bipolar disorder ([place])   . Depression   . Diabetes mellitus, type II [facility])     Past Surgical History:  Procedure Laterality Date  . CERVIX SURGERY      Family Psychiatric History:  H/o  anxiety in her family  Family History:  Family History  Problem Relation Age of Onset  . Liver cancer Mother   . Heart attack Father   . Heart disease Sister   . Heart disease Brother   . Heart disease Brother   . Heart disease Brother     Social History:   Social History   Social History  . Marital status: Widowed    Spouse name: N/A  . Number of children: N/A  . Years of education: N/A   Social History Main Topics  . Smoking status: Current Every Day Smoker    Packs/day: 1.00    Years: 46.00    Types: Cigarettes    Start date: 09/16/1964  . Smokeless tobacco: Never Used  . Alcohol use No  . Drug use: No  . Sexual activity: Not Currently   Other Topics Concern  . None   Social History Narrative  . None    Additional Social History:  Lives with daughter. Widowed.   Allergies:   Allergies  Allergen Reactions  . Demerol [Meperidine] Other (See Comments)    Pt states she "turned green"     Metabolic Disorder Labs: No results found for:  HGBA1C, MPG No results found for: PROLACTIN No results found for: CHOL, TRIG, HDL, CHOLHDL, VLDL, LDLCALC   Current Medications: Current Outpatient Prescriptions  Medication Sig Dispense Refill  . azithromycin (ZITHROMAX Z-PAK) 250 MG tablet Take 2 tablets (500 mg) on  Day 1,  followed by 1 tablet (250 mg) once daily on Days 2 through 5. 6 each 0  . benzonatate (TESSALON PERLES) 100 MG capsule 1-2 every 8 hours prn cough 30 capsule 0  . buPROPion (WELLBUTRIN SR) 100 MG 12 hr tablet Take 1 tablet (100 mg total) by mouth every morning. 30 tablet 2  . JANUVIA 25 MG tablet Reported on 03/05/2015   0   No current facility-administered medications for this visit.     Neurologic: Headache: No Seizure: No Paresthesias:No  Musculoskeletal: Strength & Muscle Tone: within normal limits Gait & Station: normal Patient leans: N/A  Psychiatric Specialty Exam: ROS  Blood pressure 138/78, pulse 96, height 5' 1.25" (1.556 m), weight 143 lb (64.9 kg).Body mass index is 26.8 kg/m.  General Appearance: Casual  Eye Contact:  Fair  Speech:  Slow  Volume:  Decreased  Mood:  Depressed  Affect:  Appropriate  Thought Process:  Coherent and Goal Directed  Orientation:  Full (Time, Place, and Person)  Thought Content:  WDL  Suicidal Thoughts:  No  Homicidal Thoughts:  No  Memory:  Immediate;   Fair Recent;   Fair Remote;   Fair  Judgement:  Fair  Insight:  Fair  Psychomotor Activity:  Normal  Concentration:  Concentration: Fair and Attention Span: Fair  Recall:  [employer] of Knowledge:Fair  Language: Fair  Akathisia:  No  Handed:  Right  AIMS (if indicated):    Assets:  Communication Skills Desire for Improvement Physical Health Social Support  ADL's:  Intact  Cognition: WNL  Sleep:  poor    Treatment Plan Summary: Medication management   Discussed with patient and her daughter about the medications treatment risks benefits and alternatives. I will decrease the dose of Wellbutrin SR 100 mg in the morning to prevent the insomnia at night. The daughter and the patient agreed with the plan. She will follow-up in 2 months or earlier depending on her symptoms. She will also have her follow up with her primary care physician in one month.   More than 50% of the time spent in psychoeducation, counseling and coordination of care.    This note was generated in part or whole with voice recognition software. Voice regonition is usually quite accurate but there are transcription errors that can and very often do occur. I apologize for any typographical errors that were not detected  and corrected.    Rainey Pines, MD 11/9/201711:45 AM

## 2016-03-02 ENCOUNTER — Ambulatory Visit: Payer: Medicare Other | Admitting: Psychiatry

## 2016-04-01 ENCOUNTER — Other Ambulatory Visit: Payer: Self-pay | Admitting: Psychiatry

## 2016-04-05 ENCOUNTER — Ambulatory Visit: Payer: Medicare Other | Admitting: Psychiatry

## 2016-04-20 ENCOUNTER — Inpatient Hospital Stay: Payer: Medicare Other

## 2016-04-20 ENCOUNTER — Inpatient Hospital Stay
Admission: EM | Admit: 2016-04-20 | Discharge: 2016-04-23 | DRG: 190 | Disposition: A | Discharge status: Home / Self Care | Payer: Medicare Other | Attending: Internal Medicine | Admitting: Internal Medicine

## 2016-04-20 ENCOUNTER — Encounter: Payer: Self-pay | Admitting: Emergency Medicine

## 2016-04-20 ENCOUNTER — Emergency Department: Payer: Medicare Other

## 2016-04-20 DIAGNOSIS — J209 Acute bronchitis, unspecified: Secondary | ICD-10-CM | POA: Diagnosis present

## 2016-04-20 DIAGNOSIS — R7989 Other specified abnormal findings of blood chemistry: Secondary | ICD-10-CM | POA: Diagnosis present

## 2016-04-20 DIAGNOSIS — R609 Edema, unspecified: Secondary | ICD-10-CM | POA: Diagnosis present

## 2016-04-20 DIAGNOSIS — Z888 Allergy status to other drugs, medicaments and biological substances status: Secondary | ICD-10-CM

## 2016-04-20 DIAGNOSIS — J9600 Acute respiratory failure, unspecified whether with hypoxia or hypercapnia: Secondary | ICD-10-CM | POA: Diagnosis present

## 2016-04-20 DIAGNOSIS — J111 Influenza due to unidentified influenza virus with other respiratory manifestations: Secondary | ICD-10-CM | POA: Diagnosis present

## 2016-04-20 DIAGNOSIS — N184 Chronic kidney disease, stage 4 (severe): Secondary | ICD-10-CM | POA: Diagnosis present

## 2016-04-20 DIAGNOSIS — J440 Chronic obstructive pulmonary disease with acute lower respiratory infection: Principal | ICD-10-CM | POA: Diagnosis present

## 2016-04-20 DIAGNOSIS — F319 Bipolar disorder, unspecified: Secondary | ICD-10-CM | POA: Diagnosis present

## 2016-04-20 DIAGNOSIS — Z23 Encounter for immunization: Secondary | ICD-10-CM

## 2016-04-20 DIAGNOSIS — Z7984 Long term (current) use of oral hypoglycemic drugs: Secondary | ICD-10-CM

## 2016-04-20 DIAGNOSIS — F419 Anxiety disorder, unspecified: Secondary | ICD-10-CM | POA: Diagnosis present

## 2016-04-20 DIAGNOSIS — J441 Chronic obstructive pulmonary disease with (acute) exacerbation: Secondary | ICD-10-CM

## 2016-04-20 DIAGNOSIS — F17210 Nicotine dependence, cigarettes, uncomplicated: Secondary | ICD-10-CM | POA: Diagnosis present

## 2016-04-20 DIAGNOSIS — Z8249 Family history of ischemic heart disease and other diseases of the circulatory system: Secondary | ICD-10-CM

## 2016-04-20 DIAGNOSIS — Z79899 Other long term (current) drug therapy: Secondary | ICD-10-CM | POA: Diagnosis not present

## 2016-04-20 DIAGNOSIS — E1122 Type 2 diabetes mellitus with diabetic chronic kidney disease: Secondary | ICD-10-CM | POA: Diagnosis present

## 2016-04-20 DIAGNOSIS — Z800 Family history of malignant neoplasm of digestive organs: Secondary | ICD-10-CM | POA: Diagnosis not present

## 2016-04-20 HISTORY — DX: Chronic obstructive pulmonary disease with (acute) exacerbation: J44.1

## 2016-04-20 LAB — URINALYSIS, COMPLETE (UACMP) WITH MICROSCOPIC
Bilirubin Urine: NEGATIVE
Glucose, UA: NEGATIVE mg/dL
Hgb urine dipstick: NEGATIVE
KETONES UR: NEGATIVE mg/dL
Leukocytes, UA: NEGATIVE
Nitrite: NEGATIVE
PROTEIN: 100 mg/dL — AB
RBC / HPF: NONE SEEN RBC/hpf (ref 0–5)
Specific Gravity, Urine: 1.024 (ref 1.005–1.030)
pH: 5 (ref 5.0–8.0)

## 2016-04-20 LAB — BASIC METABOLIC PANEL
Anion gap: 11 (ref 5–15)
BUN: 39 mg/dL — ABNORMAL HIGH (ref 6–20)
CALCIUM: 8.3 mg/dL — AB (ref 8.9–10.3)
CHLORIDE: 104 mmol/L (ref 101–111)
CO2: 20 mmol/L — AB (ref 22–32)
CREATININE: 2.2 mg/dL — AB (ref 0.44–1.00)
GFR calc non Af Amer: 21 mL/min — ABNORMAL LOW (ref >60)
GFR, EST AFRICAN AMERICAN: 25 mL/min — AB (ref >60)
GLUCOSE: 183 mg/dL — AB (ref 65–99)
Potassium: 4.9 mmol/L (ref 3.5–5.1)
Sodium: 135 mmol/L (ref 135–145)

## 2016-04-20 LAB — CBC
HCT: 38 % (ref 35.0–47.0)
HEMOGLOBIN: 12.4 g/dL (ref 12.0–16.0)
MCH: 28.4 pg (ref 26.0–34.0)
MCHC: 32.7 g/dL (ref 32.0–36.0)
MCV: 86.6 fL (ref 80.0–100.0)
Platelets: 191 10*3/uL (ref 150–440)
RBC: 4.39 MIL/uL (ref 3.80–5.20)
RDW: 14.9 % — ABNORMAL HIGH (ref 11.5–14.5)
WBC: 6.1 10*3/uL (ref 3.6–11.0)

## 2016-04-20 LAB — GLUCOSE, CAPILLARY
Glucose-Capillary: 299 mg/dL — ABNORMAL HIGH (ref 65–99)
Glucose-Capillary: 285 mg/dL — ABNORMAL HIGH (ref 65–99)
Glucose-Capillary: 284 mg/dL — ABNORMAL HIGH (ref 65–99)

## 2016-04-20 LAB — BLOOD GAS, VENOUS
Acid-base deficit: 4.9 mmol/L — ABNORMAL HIGH (ref 0.0–2.0)
Bicarbonate: 24.1 mmol/L (ref 20.0–28.0)
FIO2: 1
PATIENT TEMPERATURE: 37
PCO2 VEN: 63 mmHg — AB (ref 44.0–60.0)
PH VEN: 7.19 — AB (ref 7.250–7.430)

## 2016-04-20 LAB — BLOOD GAS, ARTERIAL
ACID-BASE DEFICIT: 5.7 mmol/L — AB (ref 0.0–2.0)
ALLENS TEST (PASS/FAIL): POSITIVE — AB
BICARBONATE: 20.7 mmol/L (ref 20.0–28.0)
Delivery systems: POSITIVE
EXPIRATORY PAP: 5
FIO2: 0.28
Inspiratory PAP: 12
O2 SAT: 98 %
Patient temperature: 37
pCO2 arterial: 43 mmHg (ref 32.0–48.0)
pH, Arterial: 7.29 — ABNORMAL LOW (ref 7.350–7.450)
pO2, Arterial: 116 mmHg — ABNORMAL HIGH (ref 83.0–108.0)

## 2016-04-20 LAB — TROPONIN I: Troponin I: <0.03 ng/mL (ref <0.03)

## 2016-04-20 LAB — LACTIC ACID, PLASMA: Lactic Acid, Venous: 1.5 mmol/L (ref 0.5–1.9)

## 2016-04-20 LAB — INFLUENZA PANEL BY PCR (TYPE A & B)
INFLAPCR: NEGATIVE
INFLBPCR: POSITIVE — AB

## 2016-04-20 IMAGING — DX DG CHEST 1V PORT
1 series · 1 of 1 positions shown · non-contrast
Comparison: Chest radiograph performed [DATE]

CLINICAL DATA: Acute onset of difficulty breathing. Fever. Initial
encounter.

EXAM:
PORTABLE CHEST 1 VIEW

[chest ap]
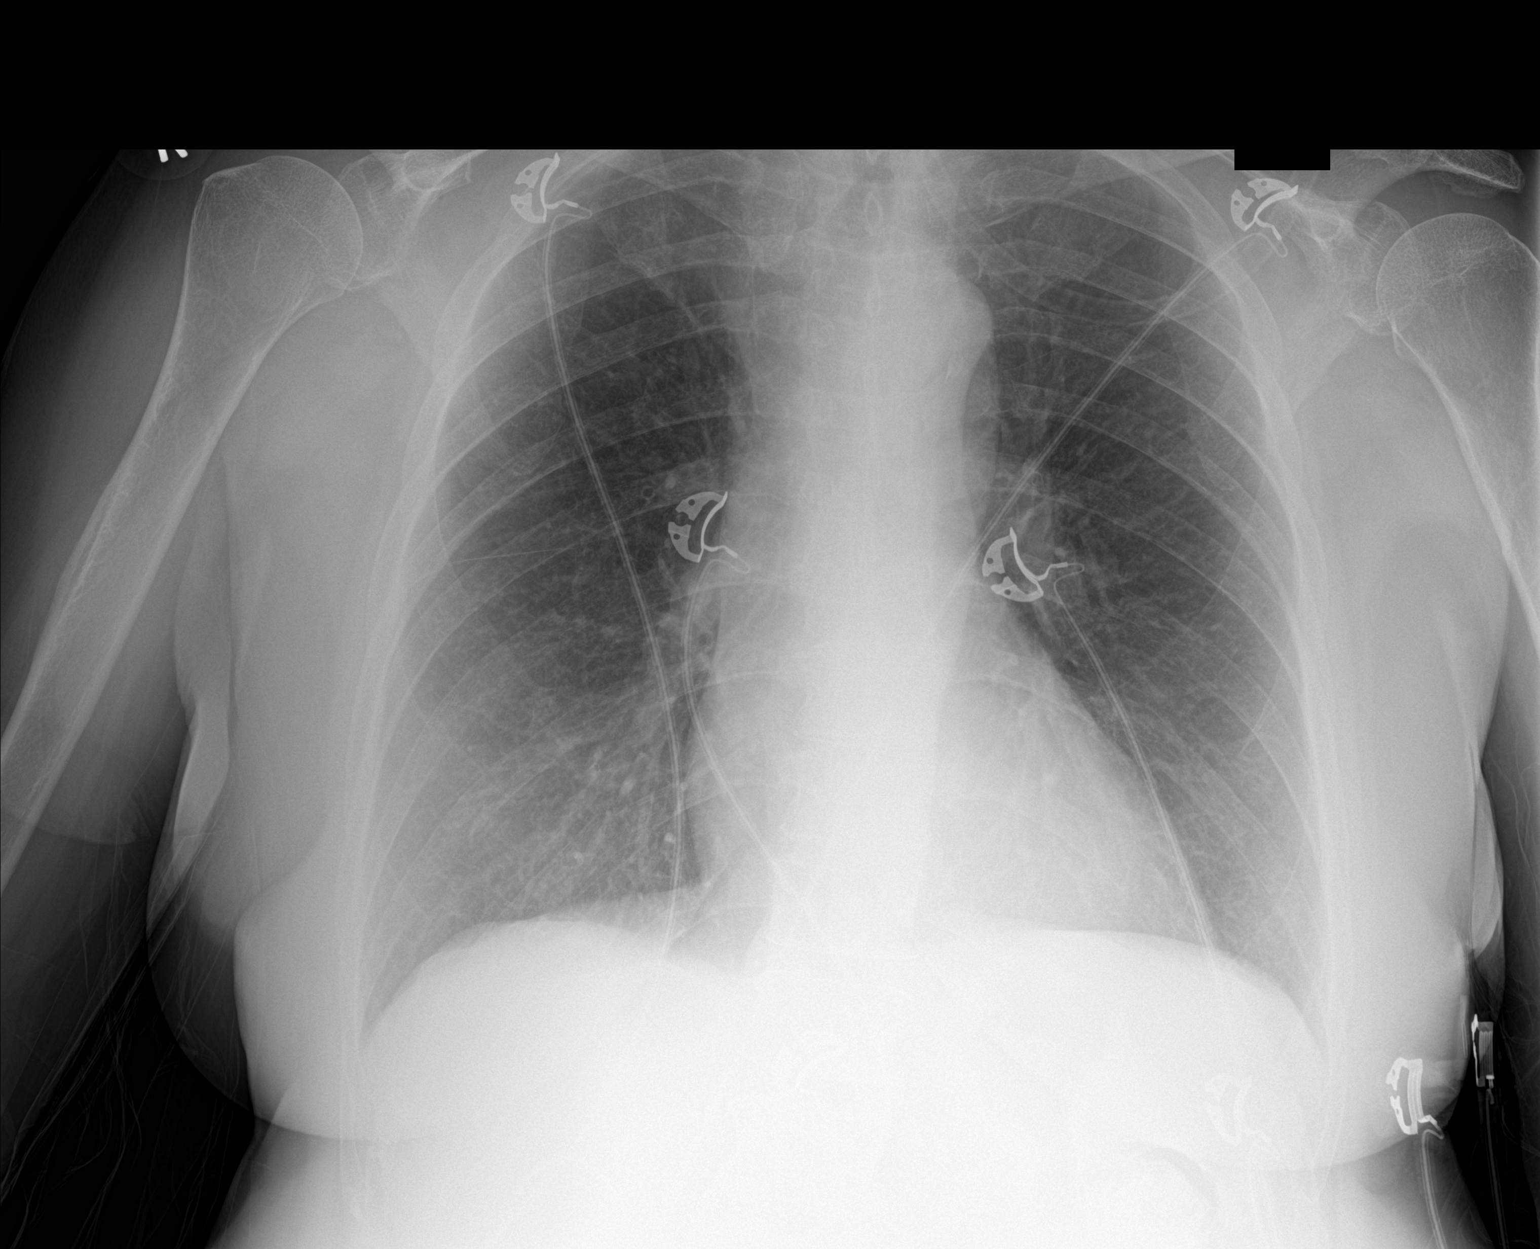

[1 of 1 positions shown; findings below may reference images not displayed]

FINDINGS: The lungs are well-aerated and clear. There is no evidence of focal
opacification, pleural effusion or pneumothorax.

The cardiomediastinal silhouette is within normal limits. No acute
osseous abnormalities are seen.
IMPRESSION: No acute cardiopulmonary process seen.

## 2016-04-20 MED ORDER — DEXTROSE 5 % IV SOLN: 1.0000 g | Freq: Once | INTRAVENOUS | Status: DC

## 2016-04-20 MED ORDER — ACETAMINOPHEN 650 MG RE SUPP: 650.0000 mg | Freq: Four times a day (QID) | RECTAL | Status: DC | PRN

## 2016-04-20 MED ORDER — IPRATROPIUM-ALBUTEROL 0.5-2.5 (3) MG/3ML IN SOLN
3.0000 mL | Freq: Once | RESPIRATORY_TRACT | Status: AC
  Administered 2016-04-20: 3 mL via RESPIRATORY_TRACT

## 2016-04-20 MED ORDER — AZITHROMYCIN 500 MG PO TABS
500.0000 mg | ORAL_TABLET | Freq: Every day | ORAL | Status: DC
  Administered 2016-04-20 – 2016-04-23 (×4): 500 mg via ORAL
  Filled 2016-04-20 (×4): qty 1

## 2016-04-20 MED ORDER — METHYLPREDNISOLONE SODIUM SUCC 125 MG IJ SOLR
INTRAMUSCULAR | Status: AC
  Administered 2016-04-20: 125 mg via INTRAVENOUS
  Filled 2016-04-20: qty 2

## 2016-04-20 MED ORDER — METHYLPREDNISOLONE SODIUM SUCC 125 MG IJ SOLR
60.0000 mg | Freq: Four times a day (QID) | INTRAMUSCULAR | Status: DC
  Administered 2016-04-20 – 2016-04-22 (×9): 60 mg via INTRAVENOUS
  Filled 2016-04-20 (×9): qty 2

## 2016-04-20 MED ORDER — BENZONATATE 100 MG PO CAPS: 100.0000 mg | ORAL_CAPSULE | Freq: Three times a day (TID) | ORAL | Status: DC | PRN

## 2016-04-20 MED ORDER — METHYLPREDNISOLONE SODIUM SUCC 125 MG IJ SOLR
125.0000 mg | Freq: Once | INTRAMUSCULAR | Status: AC
  Administered 2016-04-20: 125 mg via INTRAVENOUS

## 2016-04-20 MED ORDER — SODIUM CHLORIDE 0.9% FLUSH
3.0000 mL | Freq: Two times a day (BID) | INTRAVENOUS | Status: DC
  Administered 2016-04-20 – 2016-04-23 (×7): 3 mL via INTRAVENOUS

## 2016-04-20 MED ORDER — GUAIFENESIN-DM 100-10 MG/5ML PO SYRP
15.0000 mL | ORAL_SOLUTION | ORAL | Status: DC | PRN
  Filled 2016-04-20: qty 15

## 2016-04-20 MED ORDER — CEFTRIAXONE SODIUM-DEXTROSE 1-3.74 GM-% IV SOLR
1.0000 g | Freq: Once | INTRAVENOUS | Status: AC
  Administered 2016-04-20: 1 g via INTRAVENOUS
  Filled 2016-04-20: qty 50

## 2016-04-20 MED ORDER — IPRATROPIUM-ALBUTEROL 0.5-2.5 (3) MG/3ML IN SOLN
3.0000 mL | Freq: Four times a day (QID) | RESPIRATORY_TRACT | Status: DC
  Administered 2016-04-20 – 2016-04-23 (×13): 3 mL via RESPIRATORY_TRACT
  Filled 2016-04-20 (×13): qty 3

## 2016-04-20 MED ORDER — PNEUMOCOCCAL VAC POLYVALENT 25 MCG/0.5ML IJ INJ
0.5000 mL | INJECTION | INTRAMUSCULAR | Status: AC
  Administered 2016-04-22: 11:00:00 0.5 mL via INTRAMUSCULAR
  Filled 2016-04-20: qty 0.5

## 2016-04-20 MED ORDER — GUAIFENESIN ER 600 MG PO TB12: 600.0000 mg | ORAL_TABLET | Freq: Two times a day (BID) | ORAL | Status: DC

## 2016-04-20 MED ORDER — SODIUM CHLORIDE 0.9% FLUSH: 3.0000 mL | INTRAVENOUS | Status: DC | PRN

## 2016-04-20 MED ORDER — SODIUM CHLORIDE 0.9 % IV SOLN
INTRAVENOUS | Status: DC
  Administered 2016-04-20 – 2016-04-22 (×4): via INTRAVENOUS

## 2016-04-20 MED ORDER — ACETAMINOPHEN 325 MG PO TABS: 650.0000 mg | ORAL_TABLET | Freq: Four times a day (QID) | ORAL | Status: DC | PRN

## 2016-04-20 MED ORDER — SODIUM CHLORIDE 0.9 % IV SOLN: 250.0000 mL | INTRAVENOUS | Status: DC | PRN

## 2016-04-20 MED ORDER — INSULIN ASPART 100 UNIT/ML ~~LOC~~ SOLN
0.0000 [IU] | Freq: Three times a day (TID) | SUBCUTANEOUS | Status: DC
  Administered 2016-04-20 (×2): 5 [IU] via SUBCUTANEOUS
  Administered 2016-04-21 – 2016-04-22 (×6): 3 [IU] via SUBCUTANEOUS
  Administered 2016-04-23: 1 [IU] via SUBCUTANEOUS
  Administered 2016-04-23: 09:00:00 2 [IU] via SUBCUTANEOUS
  Filled 2016-04-20: qty 3
  Filled 2016-04-20: qty 5
  Filled 2016-04-20: qty 3
  Filled 2016-04-20: qty 2
  Filled 2016-04-20: qty 5
  Filled 2016-04-20: qty 3
  Filled 2016-04-20: qty 5
  Filled 2016-04-20: qty 3
  Filled 2016-04-20: qty 2
  Filled 2016-04-20: qty 3

## 2016-04-20 MED ORDER — IPRATROPIUM-ALBUTEROL 0.5-2.5 (3) MG/3ML IN SOLN
RESPIRATORY_TRACT | Status: AC
  Administered 2016-04-20: 3 mL via RESPIRATORY_TRACT
  Filled 2016-04-20: qty 9

## 2016-04-20 MED ORDER — ENOXAPARIN SODIUM 40 MG/0.4ML ~~LOC~~ SOLN: 40.0000 mg | SUBCUTANEOUS | Status: DC

## 2016-04-20 MED ORDER — BUPROPION HCL ER (SR) 100 MG PO TB12
100.0000 mg | ORAL_TABLET | Freq: Every morning | ORAL | Status: DC
  Administered 2016-04-20 – 2016-04-23 (×4): 100 mg via ORAL
  Filled 2016-04-20 (×4): qty 1

## 2016-04-20 MED ORDER — ENOXAPARIN SODIUM 30 MG/0.3ML ~~LOC~~ SOLN
30.0000 mg | SUBCUTANEOUS | Status: DC
  Administered 2016-04-20 – 2016-04-22 (×3): 30 mg via SUBCUTANEOUS
  Filled 2016-04-20 (×3): qty 0.3

## 2016-04-20 MED ORDER — TIOTROPIUM BROMIDE MONOHYDRATE 18 MCG IN CAPS
18.0000 ug | ORAL_CAPSULE | Freq: Every day | RESPIRATORY_TRACT | Status: DC
  Administered 2016-04-20 – 2016-04-22 (×3): 18 ug via RESPIRATORY_TRACT
  Filled 2016-04-20: qty 5

## 2016-04-20 MED ORDER — ONDANSETRON HCL 4 MG PO TABS: 4.0000 mg | ORAL_TABLET | Freq: Four times a day (QID) | ORAL | Status: DC | PRN

## 2016-04-20 MED ORDER — GUAIFENESIN ER 600 MG PO TB12
600.0000 mg | ORAL_TABLET | Freq: Two times a day (BID) | ORAL | Status: DC
  Administered 2016-04-20 – 2016-04-23 (×7): 600 mg via ORAL
  Filled 2016-04-20 (×8): qty 1

## 2016-04-20 MED ORDER — BUDESONIDE 0.25 MG/2ML IN SUSP
0.2500 mg | Freq: Two times a day (BID) | RESPIRATORY_TRACT | Status: DC
  Administered 2016-04-20 – 2016-04-23 (×7): 0.25 mg via RESPIRATORY_TRACT
  Filled 2016-04-20 (×6): qty 2

## 2016-04-20 MED ORDER — DEXTROSE 5 % IV SOLN
500.0000 mg | Freq: Once | INTRAVENOUS | Status: AC
  Administered 2016-04-20: 500 mg via INTRAVENOUS
  Filled 2016-04-20: qty 500

## 2016-04-20 MED ORDER — ONDANSETRON HCL 4 MG/2ML IJ SOLN: 4.0000 mg | Freq: Four times a day (QID) | INTRAMUSCULAR | Status: DC | PRN

## 2016-04-20 MED ORDER — LINAGLIPTIN 5 MG PO TABS
5.0000 mg | ORAL_TABLET | Freq: Every day | ORAL | Status: DC
  Administered 2016-04-20 – 2016-04-23 (×4): 5 mg via ORAL
  Filled 2016-04-20 (×4): qty 1

## 2016-04-20 NOTE — ED Notes (Signed)
Pt to US.

## 2016-04-20 NOTE — ED Provider Notes (Addendum)
[facility] Emergency Department Provider Note   First MD Initiated Contact with Patient 04/20/16 0530     (approximate)  I have reviewed the triage vital signs and the nursing notes.   HISTORY  Chief Complaint Respiratory Distress    HPI Miranda Ryan is a 71 y.o. female with below list of chronic medical conditions including standing history of cigarette smoking and recently diagnosed influenza on 04/12/2016 presents to the emergency department with progressive dyspnea and apparent rest or distress onset tonight. Patient's daughter states that the patient had a subjective fever today while at home.   Past Medical History:  Diagnosis Date  . Anxiety   . Bipolar disorder ([place])   . Depression   . Diabetes mellitus, type II [facility])     Patient Active Problem List   Diagnosis Date Noted  . Acute exacerbation of chronic obstructive pulmonary disease (COPD) ([place]) 04/20/2016    Past Surgical History:  Procedure Laterality Date  . CERVIX SURGERY      Prior to Admission medications   Medication Sig Start Date End Date Taking? Authorizing Provider  atorvastatin (LIPITOR) 40 MG tablet Take 40 mg by mouth at bedtime.   Yes Historical Provider, MD  benzonatate (TESSALON PERLES) 100 MG capsule 1-2 every 8 hours prn cough 11/29/15  Yes Johnn Hai, PA-C  buPROPion [facility] SR) 100 MG 12 hr tablet take 1 tablet by mouth every morning 04/02/16  Yes Rainey Pines, MD  JANUVIA 25 MG tablet Take 25 mg by mouth daily.    Yes Historical Provider, MD  losartan (COZAAR) 100 MG tablet Take 100 mg by mouth daily.   Yes Historical Provider, MD  azithromycin (ZITHROMAX Z-PAK) 250 MG tablet Take 2 tablets (500 mg) on  Day 1,  followed by 1 tablet (250 mg) once daily on Days 2 through 5. Patient not taking: Reported on 04/20/2016 11/29/15   Johnn Hai, PA-C    Allergies Demerol [meperidine]  Family History  Problem Relation Age of Onset  . Liver cancer Mother     . Heart attack Father   . Heart disease Sister   . Heart disease Brother   . Heart disease Brother   . Heart disease Brother     Social History Social History  Substance Use Topics  . Smoking status: Current Every Day Smoker    Packs/day: 1.00    Years: 46.00    Types: Cigarettes    Start date: 09/16/1964  . Smokeless tobacco: Never Used  . Alcohol use No    Review of Systems Constitutional: Positive for fever/chills Eyes: No visual changes. ENT: No sore throat. Cardiovascular: Denies chest pain. Respiratory: Positive for shortness of breath Gastrointestinal: No abdominal pain.  No nausea, no vomiting.  No diarrhea.  No constipation. Genitourinary: Negative for dysuria. Musculoskeletal: Negative for back pain. Skin: Negative for rash. Neurological: Negative for headaches, focal weakness or numbness.  10-point ROS otherwise negative.  ____________________________________________   PHYSICAL EXAM:  VITAL SIGNS: ED Triage Vitals [04/20/16 0525]  Enc Vitals Group     BP (!) 77/52     Pulse Rate (!) 101     Resp (!) 24     Temp 98.4 F (36.9 C)     Temp Source Oral     SpO2 96 %     Weight 142 lb (64.4 kg)     Height 5\' 1"  (1.549 m)     Head Circumference      Peak Flow  Pain Score 2     Pain Loc      Pain Edu?      Excl. in [place]?     Constitutional: Alert and oriented. Apparent respiratory distress Eyes: Conjunctivae are normal. PERRL. EOMI. Head: Atraumatic. Mouth/Throat: Mucous membranes are moist. Oropharynx non-erythematous. Neck: No stridor.  Cardiovascular: Tachycardia regular rhythm. Good peripheral circulation. Grossly normal heart sounds. Respiratory: Tachypnea, positive accessory respiratory muscle use, diffuse wheezes and rhonchi. Gastrointestinal: Soft and nontender. No distention.  Musculoskeletal: No lower extremity tenderness nor edema. No gross deformities of extremities. Neurologic:  Normal speech and language. No gross focal  neurologic deficits are appreciated.  Skin:  Skin is warm, dry and intact. No rash noted. Psychiatric: Mood and affect are normal. Speech and behavior are normal.  ____________________________________________   LABS (all labs ordered are listed, but only abnormal results are displayed)  Labs Reviewed  BASIC METABOLIC PANEL - Abnormal; Notable for the following:       Result Value   CO2 20 (*)    Glucose, Bld 183 (*)    BUN 39 (*)    Creatinine, Ser 2.20 (*)    Calcium 8.3 (*)    GFR calc non Af Amer 21 (*)    GFR calc Af Amer 25 (*)    All other components within normal limits  CBC - Abnormal; Notable for the following:    RDW 14.9 (*)    All other components within normal limits  URINALYSIS, COMPLETE (UACMP) WITH MICROSCOPIC - Abnormal; Notable for the following:    Color, Urine YELLOW (*)    APPearance CLOUDY (*)    Protein, ur 100 (*)    Bacteria, UA MANY (*)    Squamous Epithelial / LPF 0-5 (*)    All other components within normal limits  INFLUENZA PANEL BY PCR (TYPE A & B) - Abnormal; Notable for the following:    Influenza B By PCR POSITIVE (*)    All other components within normal limits  BLOOD GAS, VENOUS - Abnormal; Notable for the following:    pH, Ven 7.19 (*)    pCO2, Ven 63 (*)    Acid-base deficit 4.9 (*)    All other components within normal limits  BLOOD GAS, ARTERIAL - Abnormal; Notable for the following:    pH, Arterial 7.29 (*)    pO2, Arterial 116 (*)    Acid-base deficit 5.7 (*)    Allens test (pass/fail) POSITIVE (*)    All other components within normal limits  GLUCOSE, CAPILLARY - Abnormal; Notable for the following:    Glucose-Capillary 299 (*)    All other components within normal limits  GLUCOSE, CAPILLARY - Abnormal; Notable for the following:    Glucose-Capillary 285 (*)    All other components within normal limits  GLUCOSE, CAPILLARY - Abnormal; Notable for the following:    Glucose-Capillary 284 (*)    All other components within  normal limits  CULTURE, BLOOD (ROUTINE X 2)  CULTURE, BLOOD (ROUTINE X 2)  TROPONIN I  LACTIC ACID, PLASMA  CBC  BASIC METABOLIC PANEL   ____________________________________________  EKG  .[facility]  ____________________________________________  RADIOLOGY I, Gregor Hams, personally viewed and evaluated these images (plain radiographs) as part of my medical decision making, as well as reviewing the written report by the radiologist.  US Venous Img Lower Bilateral  Result Date: 04/20/2016 CLINICAL DATA:  Lower extremity swelling. EXAM: BILATERAL LOWER EXTREMITY VENOUS DOPPLER ULTRASOUND TECHNIQUE: Gray-scale sonography with graded compression, as well as color  Doppler and duplex ultrasound were performed to evaluate the lower extremity deep venous systems from the level of the common femoral vein and including the common femoral, femoral, profunda femoral, popliteal and calf veins including the posterior tibial, peroneal and gastrocnemius veins when visible. The superficial great saphenous vein was also interrogated. Spectral Doppler was utilized to evaluate flow at rest and with distal augmentation maneuvers in the common femoral, femoral and popliteal veins. COMPARISON:  None. FINDINGS: RIGHT LOWER EXTREMITY Common Femoral Vein: No evidence of thrombus. Normal compressibility, respiratory phasicity and response to augmentation. Saphenofemoral Junction: No evidence of thrombus. Normal compressibility and flow on color Doppler imaging. Profunda Femoral Vein: No evidence of thrombus. Normal compressibility and flow on color Doppler imaging. Femoral Vein: No evidence of thrombus. Normal compressibility, respiratory phasicity and response to augmentation. Popliteal Vein: No evidence of thrombus. Normal compressibility, respiratory phasicity and response to augmentation. Calf Veins: No evidence of thrombus. Normal compressibility and flow on color Doppler imaging. Superficial Great Saphenous  Vein: No evidence of thrombus. Normal compressibility and flow on color Doppler imaging. Venous Reflux:  None. Other Findings:  None. LEFT LOWER EXTREMITY Common Femoral Vein: No evidence of thrombus. Normal compressibility, respiratory phasicity and response to augmentation. Saphenofemoral Junction: No evidence of thrombus. Normal compressibility and flow on color Doppler imaging. Profunda Femoral Vein: No evidence of thrombus. Normal compressibility and flow on color Doppler imaging. Femoral Vein: No evidence of thrombus. Normal compressibility, respiratory phasicity and response to augmentation. Popliteal Vein: No evidence of thrombus. Normal compressibility, respiratory phasicity and response to augmentation. Calf Veins: Posterior tibial veins patent. Peroneal vein not well visualized. Superficial Great Saphenous Vein: No evidence of thrombus. Normal compressibility and flow on color Doppler imaging. Venous Reflux:  None. Other Findings:  None. IMPRESSION: No evidence of deep venous thrombosis. Electronically Signed   By: Rolm Baptise M.D.   On: 04/20/2016 10:41   Dg Chest Port 1 View  Result Date: 04/20/2016 CLINICAL DATA:  Acute onset of difficulty breathing. Fever. Initial encounter. EXAM: PORTABLE CHEST 1 VIEW COMPARISON:  Chest radiograph performed 11/29/2015 FINDINGS: The lungs are well-aerated and clear. There is no evidence of focal opacification, pleural effusion or pneumothorax. The cardiomediastinal silhouette is within normal limits. No acute osseous abnormalities are seen. IMPRESSION: No acute cardiopulmonary process seen. Electronically Signed   By: Garald Balding M.D.   On: 04/20/2016 05:58      Procedures   Critical Care performed: CRITICAL CARE Performed by: Gregor Hams   Total critical care time: 45 minutes  Critical care time was exclusive of separately billable procedures and treating other patients.  Critical care was necessary to treat or prevent imminent or  life-threatening deterioration.  Critical care was time spent personally by me on the following activities: development of treatment plan with patient and/or surrogate as well as nursing, discussions with consultants, evaluation of patient's response to treatment, examination of patient, obtaining history from patient or surrogate, ordering and performing treatments and interventions, ordering and review of laboratory studies, ordering and review of radiographic studies, pulse oximetry and re-evaluation of patient's condition. ___________   INITIAL IMPRESSION / ASSESSMENT AND PLAN / ED COURSE  Pertinent labs & imaging results that were available during my care of the patient were reviewed by me and considered in my medical decision making (see chart for details).  Patient presents to the emergency department with apparent rest or distress and a such DuoNeb 3 given however patient continues to have positive accessory rest or muscle use  and apparent rest or distress and a such BiPAP was applied. Patient discussed with intensivist on call for hospital admission for respiratory distress, influenza possible COPD as well.     Clinical Course as of Apr 21 28  Tue Apr 20, 2016  0847 Dr. Ashby Dawes in the ICU evaluated the patient in person in the ED and spoke with me directly about the patient.  She is doing much better after the BiPAP with an improved ABG and he was able to wean her to nasal cannula without any severe respiratory distress.  He feels she is appropriate for the hospitalist either for floor status or stepdown as per their level of comfort.  I spoke by phone with the hospitalist and explained the situation and he will come down and admit.  [CF]    Clinical Course User Index [CF] Hinda Kehr, MD    ____________________________________________  FINAL CLINICAL IMPRESSION(S) / ED DIAGNOSES  Influenza B Respiratory distress  MEDICATIONS GIVEN DURING THIS VISIT:  Medications   buPROPion (WELLBUTRIN SR) 12 hr tablet 100 mg (100 mg Oral Given 04/20/16 1334)  benzonatate (TESSALON) capsule 100-200 mg (not administered)  linagliptin (TRADJENTA) tablet 5 mg (5 mg Oral Given 04/20/16 1334)  sodium chloride flush (NS) 0.9 % injection 3 mL (3 mLs Intravenous Given 04/20/16 2152)  sodium chloride flush (NS) 0.9 % injection 3 mL (not administered)  0.9 %  sodium chloride infusion (not administered)  acetaminophen (TYLENOL) tablet 650 mg (not administered)    Or  acetaminophen (TYLENOL) suppository 650 mg (not administered)  ondansetron (ZOFRAN) tablet 4 mg (not administered)    Or  ondansetron (ZOFRAN) injection 4 mg (not administered)  tiotropium (SPIRIVA) inhalation capsule 18 mcg (18 mcg Inhalation Given 04/20/16 1300)  0.9 %  sodium chloride infusion ( Intravenous New Bag/Given 04/20/16 2358)  ipratropium-albuterol (DUONEB) 0.5-2.5 (3) MG/3ML nebulizer solution 3 mL (3 mLs Nebulization Given 04/20/16 2006)  guaiFENesin (MUCINEX) 12 hr tablet 600 mg (600 mg Oral Given 04/20/16 2152)  methylPREDNISolone sodium succinate (SOLU-MEDROL) 125 mg/2 mL injection 60 mg (60 mg Intravenous Given 04/20/16 2152)  budesonide (PULMICORT) nebulizer solution 0.25 mg (0.25 mg Nebulization Given 04/20/16 2006)  insulin aspart (novoLOG) injection 0-9 Units (5 Units Subcutaneous Given 04/20/16 1748)  guaiFENesin-dextromethorphan (ROBITUSSIN DM) 100-10 MG/5ML syrup 15 mL (not administered)  azithromycin (ZITHROMAX) tablet 500 mg (500 mg Oral Given 04/20/16 1334)  enoxaparin (LOVENOX) injection 30 mg (30 mg Subcutaneous Given 04/20/16 2152)  pneumococcal 23 valent vaccine (PNU-IMMUNE) injection 0.5 mL (not administered)  azithromycin (ZITHROMAX) 500 mg in dextrose 5 % 250 mL IVPB (0 mg Intravenous Stopped 04/20/16 0731)  methylPREDNISolone sodium succinate (SOLU-MEDROL) 125 mg/2 mL injection 125 mg (125 mg Intravenous Given 04/20/16 0545)  ipratropium-albuterol (DUONEB) 0.5-2.5 (3) MG/3ML nebulizer  solution 3 mL (3 mLs Nebulization Given 04/20/16 0548)  ipratropium-albuterol (DUONEB) 0.5-2.5 (3) MG/3ML nebulizer solution 3 mL (3 mLs Nebulization Given 04/20/16 0548)  ipratropium-albuterol (DUONEB) 0.5-2.5 (3) MG/3ML nebulizer solution 3 mL (3 mLs Nebulization Given 04/20/16 0548)  cefTRIAXone (ROCEPHIN) IVPB 1 g (1 g Intravenous Given 04/20/16 0559)     NEW OUTPATIENT MEDICATIONS STARTED DURING THIS VISIT:  Current Discharge Medication List      Current Discharge Medication List      Current Discharge Medication List       Note:  This document was prepared using Dragon voice recognition software and may include unintentional dictation errors.    Gregor Hams, MD 04/21/16 [id number]    Gregor Hams, MD 04/21/16  0033  

## 2016-04-20 NOTE — ED Provider Notes (Signed)
Clinical Course as of Apr 20 846  Tue Apr 20, 2016  0847 Dr. Ashby Dawes in the ICU evaluated the patient in person in the ED and spoke with me directly about the patient.  She is doing much better after the BiPAP with an improved ABG and he was able to wean her to nasal cannula without any severe respiratory distress.  He feels she is appropriate for the hospitalist either for floor status or stepdown as per their level of comfort.  I spoke by phone with the hospitalist and explained the situation and he will come down and admit.  [CF]    Clinical Course User Index [CF] Hinda Kehr, MD      Hinda Kehr, MD 04/20/16 [phone]

## 2016-04-20 NOTE — Progress Notes (Signed)
Pharmacy adjusted Lovenox dose to 30mg  q24hr per protocol based on CrCl of 21.5 ml/min Charlane Ferretti RPh

## 2016-04-20 NOTE — ED Notes (Signed)
Report to Crysta, RN on 1C

## 2016-04-20 NOTE — H&P (Addendum)
[place] [facility] [place] NAME: Miranda Ryan    MR#:  [id number]  DATE OF BIRTH:  02-10-1946  DATE OF ADMISSION:  04/20/2016  PRIMARY CARE PHYSICIAN: [facility]   REQUESTING/REFERRING PHYSICIAN: Marjean Donna MD  CHIEF COMPLAINT:   Chief Complaint  Patient presents with  . Respiratory Distress    HISTORY OF PRESENT ILLNESS: Miranda Ryan  is a 71 y.o. female with a known history of  Nicotine Abuse but no lung disease according to her. She presents progressive shortness of breath. She reports that about 5 days ago she started with upper respiratory infection with cough congestion as well as fevers. Over the past 2 days she is having more shortness of breath which is gotten worse. She reports that she was wheezing and continued to have nonproductive cough. When she initially arrived to the ED she had to be placed on BiPAP. She was seen by the pulmonologist who recommended she can be taken off BiPAP and can be admitted to the floor. She states that she does feel little better.  PAST MEDICAL HISTORY:   Past Medical History:  Diagnosis Date  . Anxiety   . Bipolar disorder ([place])   . Depression   . Diabetes mellitus, type II ([place])     PAST SURGICAL HISTORY: Past Surgical History:  Procedure Laterality Date  . CERVIX SURGERY      SOCIAL HISTORY:  Social History  Substance Use Topics  . Smoking status: Current Every Day Smoker    Packs/day: 1.00    Years: 46.00    Types: Cigarettes    Start date: 09/16/1964  . Smokeless tobacco: Never Used  . Alcohol use No    FAMILY HISTORY:  Family History  Problem Relation Age of Onset  . Liver cancer Mother   . Heart attack Father   . Heart disease Sister   . Heart disease Brother   . Heart disease Brother   . Heart disease Brother     DRUG ALLERGIES:  Allergies  Allergen Reactions  . Demerol [Meperidine] Other (See Comments)    Pt states she "turned green"     REVIEW OF SYSTEMS:    CONSTITUTIONAL: No fever, fatigue or weakness.  EYES: No blurred or double vision.  EARS, NOSE, AND THROAT: No tinnitus or ear pain.  RESPIRATORY: Positive cough, positive shortness of breath, positive wheezing or no hemoptysis.  CARDIOVASCULAR: No chest pain, orthopnea, edema.  GASTROINTESTINAL: No nausea, vomiting, diarrhea or abdominal pain.  GENITOURINARY: No dysuria, hematuria.  ENDOCRINE: No polyuria, nocturia,  HEMATOLOGY: No anemia, easy bruising or bleeding SKIN: No rash or lesion. MUSCULOSKELETAL: No joint pain or arthritis.   NEUROLOGIC: No tingling, numbness, weakness.  PSYCHIATRY: No anxiety or depression.   MEDICATIONS AT HOME:  Prior to Admission medications   Medication Sig Start Date End Date Taking? Authorizing Provider  atorvastatin (LIPITOR) 40 MG tablet Take 40 mg by mouth at bedtime.   Yes Historical Provider, MD  benzonatate (TESSALON PERLES) 100 MG capsule 1-2 every 8 hours prn cough 11/29/15  Yes Johnn Hai, PA-C  buPROPion [facility] SR) 100 MG 12 hr tablet take 1 tablet by mouth every morning 04/02/16  Yes Rainey Pines, MD  JANUVIA 25 MG tablet Take 25 mg by mouth daily.    Yes Historical Provider, MD  losartan (COZAAR) 100 MG tablet Take 100 mg by mouth daily.   Yes Historical Provider, MD  azithromycin (ZITHROMAX Z-PAK) 250 MG tablet Take 2 tablets (  500 mg) on  Day 1,  followed by 1 tablet (250 mg) once daily on Days 2 through 5. Patient not taking: Reported on 04/20/2016 11/29/15   Johnn Hai, PA-C      PHYSICAL EXAMINATION:   VITAL SIGNS: Blood pressure 122/79, pulse (!) 127, temperature 98.4 F (36.9 C), temperature source Oral, resp. rate 19, height 5\' 1"  (1.549 m), weight 162 lb 14.7 oz (73.9 kg), SpO2 100 %.  GENERAL:  71 y.o.-year-old patient lying in the bed with no acute distress.  EYES: Pupils equal, round, reactive to light and accommodation. No scleral icterus. Extraocular muscles intact.  HEENT: Head atraumatic, normocephalic.  Oropharynx and nasopharynx clear.  NECK:  Supple, no jugular venous distention. No thyroid enlargement, no tenderness.  LUNGS: Diffuse wheezing throughout both lungs, no crackles, no rhonchi positive for sister muscle usage  CARDIOVASCULAR: S1, S2 normal. No murmurs, rubs, or gallops.  ABDOMEN: Soft, nontender, nondistended. Bowel sounds present. No organomegaly or mass.  EXTREMITIES: Left leg swollen more than right NEUROLOGIC: Cranial nerves II through XII are intact. Muscle strength 5/5 in all extremities. Sensation intact. Gait not checked.  PSYCHIATRIC: The patient is alert and oriented x 3.  SKIN: No obvious rash, lesion, or ulcer.   LABORATORY PANEL:   CBC  Recent Labs Lab 04/20/16 0541  WBC 6.1  HGB 12.4  HCT 38.0  PLT 191  MCV 86.6  MCH 28.4  MCHC 32.7  RDW 14.9*   ------------------------------------------------------------------------------------------------------------------  Chemistries   Recent Labs Lab 04/20/16 0541  NA 135  K 4.9  CL 104  CO2 20*  GLUCOSE 183*  BUN 39*  CREATININE 2.20*  CALCIUM 8.3*   ------------------------------------------------------------------------------------------------------------------ estimated creatinine clearance is 21.5 mL/min (by C-G formula based on SCr of 2.2 mg/dL (H)). ------------------------------------------------------------------------------------------------------------------ No results for input(s): TSH, T4TOTAL, T3FREE, THYROIDAB in the last 72 hours.  Invalid input(s): FREET3   Coagulation profile No results for input(s): INR, PROTIME in the last 168 hours. ------------------------------------------------------------------------------------------------------------------- No results for input(s): DDIMER in the last 72 hours. -------------------------------------------------------------------------------------------------------------------  Cardiac Enzymes  Recent Labs Lab 04/20/16 0541   TROPONINI <0.03   ------------------------------------------------------------------------------------------------------------------ Invalid input(s): POCBNP  ---------------------------------------------------------------------------------------------------------------  Urinalysis    Component Value Date/Time   COLORURINE YELLOW (A) 04/20/2016 0616   APPEARANCEUR CLOUDY (A) 04/20/2016 0616   LABSPEC 1.024 04/20/2016 0616   PHURINE 5.0 04/20/2016 0616   GLUCOSEU NEGATIVE 04/20/2016 0616   HGBUR NEGATIVE 04/20/2016 0616   BILIRUBINUR NEGATIVE 04/20/2016 0616   KETONESUR NEGATIVE 04/20/2016 0616   PROTEINUR 100 (A) 04/20/2016 0616   NITRITE NEGATIVE 04/20/2016 0616   LEUKOCYTESUR NEGATIVE 04/20/2016 0616     RADIOLOGY: Dg Chest Port 1 View  Result Date: 04/20/2016 CLINICAL DATA:  Acute onset of difficulty breathing. Fever. Initial encounter. EXAM: PORTABLE CHEST 1 VIEW COMPARISON:  Chest radiograph performed 11/29/2015 FINDINGS: The lungs are well-aerated and clear. There is no evidence of focal opacification, pleural effusion or pneumothorax. The cardiomediastinal silhouette is within normal limits. No acute osseous abnormalities are seen. IMPRESSION: No acute cardiopulmonary process seen. Electronically Signed   By: Garald Balding M.D.   On: 04/20/2016 05:58    EKG: Orders placed or performed during the hospital encounter of 04/20/16  . ED EKG  . ED EKG  . EKG 12-Lead  . EKG 12-Lead  . ED EKG 12-Lead  . ED EKG 12-Lead    IMPRESSION AND PLAN: Patient is a 71 year old with nicotine abuse presents with progressive shortness of breath  1. Acute respiratory failure Due to  acute COPD exasperation with acute bronchitis COPD would be a new diagnosis for this patient We'll treat with nebulizers, IV Solu-Medrol Antibiotics, cough suppressants  2. Left lower extremity swelling I will obtain a Doppler of the lower extremity  3. Elevated creatinine no baseline available  possible acute renal failure I will give IV fluids will hold her Cozaar  4. Diabetes type II Will  on sliding scale insulin continue home regimen  5. Depression wellbutrin  6. Creatine abuse smoking cessation provided I strongly recommended patient stop smoking and offered a nicotine replacement she does not want nicotine replacement   All the records are reviewed and case discussed with ED provider. Management plans discussed with the patient, family and they are in agreement.  CODE STATUS: Code Status History    This patient does not have a recorded code status. Please follow your organizational policy for patients in this situation.       TOTAL TIME TAKING CARE OF THIS PATIENT7minutes.    Dustin Flock M.D on 04/20/2016 at 9:01 AM  Between 7am to 6pm - Pager - [phone]  After 6pm go to [email or web address] - password [email or web address] [place]  Office  [phone]  CC: Primary care physician; [place]

## 2016-04-20 NOTE — ED Notes (Signed)
Respiratory notified of ABG ordered

## 2016-04-20 NOTE — Progress Notes (Signed)
Pt seen and evaluated in ED. Appears to be feeling much better since arrival to ED.  O/E, vital stable, o2 sat=99% on Bipap 12/5 FiO2 28%.  Lungs, bilateral scattered wheeze.   --Pt taken off of bipap, placed on 2L with sat of 99%, pt appears comfortable with no complaints.   --D/w ED, pt can likely be admitted to general medical floor with hospitalist service. Would continue scheduled duonebs, steroids, and bipap qhs.   Marda Stalker, M.D.  04/20/2016

## 2016-04-20 NOTE — ED Triage Notes (Signed)
Pt presents to ED with difficulty breathing. Recently dx with the flu and yesterday her daughter noticed that she seemed to be having a harder time breathing. Pt states there are no nebulizer or inhalers in the home. Fever today

## 2016-04-20 NOTE — ED Notes (Signed)
Report from [place], Therapist, sports.

## 2016-04-20 NOTE — ED Notes (Signed)
Miranda Ryan, daughter HCPOA left number [phone]

## 2016-04-21 LAB — BASIC METABOLIC PANEL
Anion gap: 7 (ref 5–15)
BUN: 47 mg/dL — AB (ref 6–20)
CHLORIDE: 107 mmol/L (ref 101–111)
CO2: 20 mmol/L — ABNORMAL LOW (ref 22–32)
Calcium: 7.9 mg/dL — ABNORMAL LOW (ref 8.9–10.3)
Creatinine, Ser: 1.89 mg/dL — ABNORMAL HIGH (ref 0.44–1.00)
GFR, EST AFRICAN AMERICAN: 30 mL/min — AB (ref >60)
GFR, EST NON AFRICAN AMERICAN: 26 mL/min — AB (ref >60)
Glucose, Bld: 270 mg/dL — ABNORMAL HIGH (ref 65–99)
POTASSIUM: 4.9 mmol/L (ref 3.5–5.1)
SODIUM: 134 mmol/L — AB (ref 135–145)

## 2016-04-21 LAB — GLUCOSE, CAPILLARY
GLUCOSE-CAPILLARY: 240 mg/dL — AB (ref 65–99)
GLUCOSE-CAPILLARY: 290 mg/dL — AB (ref 65–99)
GLUCOSE-CAPILLARY: 242 mg/dL — AB (ref 65–99)
GLUCOSE-CAPILLARY: 206 mg/dL — AB (ref 65–99)

## 2016-04-21 LAB — CORTISOL: CORTISOL PLASMA: 4.9 ug/dL

## 2016-04-21 LAB — CBC
HCT: 33.3 % — ABNORMAL LOW (ref 35.0–47.0)
Hemoglobin: 11.1 g/dL — ABNORMAL LOW (ref 12.0–16.0)
MCH: 28.5 pg (ref 26.0–34.0)
MCHC: 33.3 g/dL (ref 32.0–36.0)
MCV: 85.4 fL (ref 80.0–100.0)
PLATELETS: 178 10*3/uL (ref 150–440)
RBC: 3.9 MIL/uL (ref 3.80–5.20)
RDW: 14.6 % — ABNORMAL HIGH (ref 11.5–14.5)
WBC: 16.3 10*3/uL — ABNORMAL HIGH (ref 3.6–11.0)

## 2016-04-21 MED ORDER — OSELTAMIVIR PHOSPHATE 30 MG PO CAPS
30.0000 mg | ORAL_CAPSULE | Freq: Every day | ORAL | Status: DC
  Administered 2016-04-21 – 2016-04-23 (×3): 30 mg via ORAL
  Filled 2016-04-21 (×3): qty 1

## 2016-04-21 MED ORDER — MIDODRINE HCL 5 MG PO TABS
2.5000 mg | ORAL_TABLET | Freq: Three times a day (TID) | ORAL | Status: DC
  Administered 2016-04-21 – 2016-04-23 (×7): 2.5 mg via ORAL
  Filled 2016-04-21 (×4): qty 1
  Filled 2016-04-21: qty 0.5
  Filled 2016-04-21: qty 1
  Filled 2016-04-21: qty 0.5

## 2016-04-21 MED ORDER — OSELTAMIVIR PHOSPHATE 75 MG PO CAPS: 75.0000 mg | ORAL_CAPSULE | Freq: Two times a day (BID) | ORAL | Status: DC

## 2016-04-21 NOTE — Progress Notes (Signed)
[place] at [facility]                                                                                                                                                                                  Patient Demographics   Miranda Ryan, is a 71 y.o. female, DOB - 1945-08-17, [id number]  Admit date - 04/20/2016   Admitting Physician Dustin Flock, MD  Outpatient Primary MD for the patient is [place]   LOS - 1  Subjective: Pt still sob, bp low States she feels little better    Review of Systems:   CONSTITUTIONAL: No documented fever. No fatigue, weakness. No weight gain, no weight loss.  EYES: No blurry or double vision.  ENT: No tinnitus. No postnasal drip. No redness of the oropharynx.  RESPIRATORY: Positive cough, no wheeze, no hemoptysis. Positive dyspnea.  CARDIOVASCULAR: No chest pain. No orthopnea. No palpitations. No syncope.  GASTROINTESTINAL: No nausea, no vomiting or diarrhea. No abdominal pain. No melena or hematochezia.  GENITOURINARY: No dysuria or hematuria.  ENDOCRINE: No polyuria or nocturia. No heat or cold intolerance.  HEMATOLOGY: No anemia. No bruising. No bleeding.  INTEGUMENTARY: No rashes. No lesions.  MUSCULOSKELETAL: No arthritis. No swelling. No gout.  NEUROLOGIC: No numbness, tingling, or ataxia. No seizure-type activity.  PSYCHIATRIC: No anxiety. No insomnia. No ADD.    Vitals:   Vitals:   04/21/16 0149 04/21/16 0356 04/21/16 0816 04/21/16 0946  BP:  118/69  (!) 90/58  Pulse:  86  (!) 113  Resp:    18  Temp:  98.3 F (36.8 C)  98.3 F (36.8 C)  TempSrc:  Oral  Oral  SpO2: 99% 98% 95% 97%  Weight:      Height:        Wt Readings from Last 3 Encounters:  04/20/16 162 lb 14.7 oz (73.9 kg)  11/29/15 115 lb (52.2 kg)     Intake/Output Summary (Last 24 hours) at 04/21/16 1154 Last data filed at 04/21/16 0617  Gross per 24 hour  Intake          1413.33 ml  Output              850 ml  Net            563.33 ml    Physical Exam:   GENERAL: Pleasant-appearing in no apparent distress.  HEAD, EYES, EARS, NOSE AND THROAT: Atraumatic, normocephalic. Extraocular muscles are intact. Pupils equal and reactive to light. Sclerae anicteric. No conjunctival injection. No oro-pharyngeal erythema.  NECK: Supple. There is no jugular venous distention. No bruits, no lymphadenopathy, no thyromegaly.  HEART: Regular rate and rhythm,. No murmurs,  no rubs, no clicks.  LUNGS: Bilateral wheezing throughout both lungs  ABDOMEN: Soft, flat, nontender, nondistended. Has good bowel sounds. No hepatosplenomegaly appreciated.  EXTREMITIES: No evidence of any cyanosis, clubbing, or peripheral edema.  +2 pedal and radial pulses bilaterally.  NEUROLOGIC: The patient is alert, awake, and oriented x3 with no focal motor or sensory deficits appreciated bilaterally.  SKIN: Moist and warm with no rashes appreciated.  Psych: Not anxious, depressed LN: No inguinal LN enlargement    Antibiotics   Anti-infectives    Start     Dose/Rate Route Frequency Ordered Stop   04/21/16 1045  oseltamivir (TAMIFLU) capsule 30 mg     30 mg Oral Daily 04/21/16 1030     04/21/16 1000  oseltamivir (TAMIFLU) capsule 75 mg  Status:  Discontinued     75 mg Oral 2 times daily 04/21/16 0921 04/21/16 1030   04/20/16 1100  azithromycin (ZITHROMAX) tablet 500 mg     500 mg Oral Daily 04/20/16 1008     04/20/16 0545  cefTRIAXone (ROCEPHIN) 1 g in dextrose 5 % 50 mL IVPB  Status:  Discontinued     1 g 100 mL/hr over 30 Minutes Intravenous  Once 04/20/16 0540 04/20/16 0544   04/20/16 0545  azithromycin (ZITHROMAX) 500 mg in dextrose 5 % 250 mL IVPB     500 mg 250 mL/hr over 60 Minutes Intravenous  Once 04/20/16 0540 04/20/16 0731   04/20/16 0545  cefTRIAXone (ROCEPHIN) IVPB 1 g     1 g 100 mL/hr over 30 Minutes Intravenous  Once 04/20/16 0544 04/20/16 0629      Medications   Scheduled Meds: . azithromycin  500 mg Oral Daily  .  budesonide (PULMICORT) nebulizer solution  0.25 mg Nebulization BID  . buPROPion  100 mg Oral q morning - 10a  . enoxaparin (LOVENOX) injection  30 mg Subcutaneous Q24H  . guaiFENesin  600 mg Oral BID  . insulin aspart  0-9 Units Subcutaneous TID WC  . ipratropium-albuterol  3 mL Nebulization Q6H  . linagliptin  5 mg Oral Daily  . methylPREDNISolone (SOLU-MEDROL) injection  60 mg Intravenous Q6H  . oseltamivir  30 mg Oral Daily  . pneumococcal 23 valent vaccine  0.5 mL Intramuscular Tomorrow-1000  . sodium chloride flush  3 mL Intravenous Q12H  . tiotropium  18 mcg Inhalation Daily   Continuous Infusions: . sodium chloride 100 mL/hr at 04/20/16 2358   PRN Meds:.sodium chloride, acetaminophen **OR** acetaminophen, benzonatate, guaiFENesin-dextromethorphan, ondansetron **OR** ondansetron (ZOFRAN) IV, sodium chloride flush   Data Review:   Micro Results Recent Results (from the past 240 hour(s))  Blood culture (routine x 2)     Status: None (Preliminary result)   Collection Time: 04/20/16  5:41 AM  Result Value Ref Range Status   Specimen Description BLOOD R ARM  Final   Special Requests BOTTLES DRAWN AEROBIC AND ANAEROBIC BCAV  Final   Culture NO GROWTH 1 DAY  Final   Report Status PENDING  Incomplete  Blood culture (routine x 2)     Status: None (Preliminary result)   Collection Time: 04/20/16  5:42 AM  Result Value Ref Range Status   Specimen Description BLOOD R AC  Final   Special Requests BOTTLES DRAWN AEROBIC AND ANAEROBIC BCHV  Final   Culture NO GROWTH 1 DAY  Final   Report Status PENDING  Incomplete    Radiology Reports US Venous Img Lower Bilateral  Result Date: 04/20/2016 CLINICAL DATA:  Lower extremity swelling. EXAM: BILATERAL  LOWER EXTREMITY VENOUS DOPPLER ULTRASOUND TECHNIQUE: Gray-scale sonography with graded compression, as well as color Doppler and duplex ultrasound were performed to evaluate the lower extremity deep venous systems from the level of the common  femoral vein and including the common femoral, femoral, profunda femoral, popliteal and calf veins including the posterior tibial, peroneal and gastrocnemius veins when visible. The superficial great saphenous vein was also interrogated. Spectral Doppler was utilized to evaluate flow at rest and with distal augmentation maneuvers in the common femoral, femoral and popliteal veins. COMPARISON:  None. FINDINGS: RIGHT LOWER EXTREMITY Common Femoral Vein: No evidence of thrombus. Normal compressibility, respiratory phasicity and response to augmentation. Saphenofemoral Junction: No evidence of thrombus. Normal compressibility and flow on color Doppler imaging. Profunda Femoral Vein: No evidence of thrombus. Normal compressibility and flow on color Doppler imaging. Femoral Vein: No evidence of thrombus. Normal compressibility, respiratory phasicity and response to augmentation. Popliteal Vein: No evidence of thrombus. Normal compressibility, respiratory phasicity and response to augmentation. Calf Veins: No evidence of thrombus. Normal compressibility and flow on color Doppler imaging. Superficial Great Saphenous Vein: No evidence of thrombus. Normal compressibility and flow on color Doppler imaging. Venous Reflux:  None. Other Findings:  None. LEFT LOWER EXTREMITY Common Femoral Vein: No evidence of thrombus. Normal compressibility, respiratory phasicity and response to augmentation. Saphenofemoral Junction: No evidence of thrombus. Normal compressibility and flow on color Doppler imaging. Profunda Femoral Vein: No evidence of thrombus. Normal compressibility and flow on color Doppler imaging. Femoral Vein: No evidence of thrombus. Normal compressibility, respiratory phasicity and response to augmentation. Popliteal Vein: No evidence of thrombus. Normal compressibility, respiratory phasicity and response to augmentation. Calf Veins: Posterior tibial veins patent. Peroneal vein not well visualized. Superficial Great  Saphenous Vein: No evidence of thrombus. Normal compressibility and flow on color Doppler imaging. Venous Reflux:  None. Other Findings:  None. IMPRESSION: No evidence of deep venous thrombosis. Electronically Signed   By: Rolm Baptise M.D.   On: 04/20/2016 10:41   Dg Chest Port 1 View  Result Date: 04/20/2016 CLINICAL DATA:  Acute onset of difficulty breathing. Fever. Initial encounter. EXAM: PORTABLE CHEST 1 VIEW COMPARISON:  Chest radiograph performed 11/29/2015 FINDINGS: The lungs are well-aerated and clear. There is no evidence of focal opacification, pleural effusion or pneumothorax. The cardiomediastinal silhouette is within normal limits. No acute osseous abnormalities are seen. IMPRESSION: No acute cardiopulmonary process seen. Electronically Signed   By: Garald Balding M.D.   On: 04/20/2016 05:58     CBC  Recent Labs Lab 04/20/16 0541 04/21/16 0338  WBC 6.1 16.3*  HGB 12.4 11.1*  HCT 38.0 33.3*  PLT 191 178  MCV 86.6 85.4  MCH 28.4 28.5  MCHC 32.7 33.3  RDW 14.9* 14.6*    Chemistries   Recent Labs Lab 04/20/16 0541 04/21/16 0338  NA 135 134*  K 4.9 4.9  CL 104 107  CO2 20* 20*  GLUCOSE 183* 270*  BUN 39* 47*  CREATININE 2.20* 1.89*  CALCIUM 8.3* 7.9*   ------------------------------------------------------------------------------------------------------------------ estimated creatinine clearance is 25.1 mL/min (by C-G formula based on SCr of 1.89 mg/dL (H)). ------------------------------------------------------------------------------------------------------------------ No results for input(s): HGBA1C in the last 72 hours. ------------------------------------------------------------------------------------------------------------------ No results for input(s): CHOL, HDL, LDLCALC, TRIG, CHOLHDL, LDLDIRECT in the last 72 hours. ------------------------------------------------------------------------------------------------------------------ No results for  input(s): TSH, T4TOTAL, T3FREE, THYROIDAB in the last 72 hours.  Invalid input(s): FREET3 ------------------------------------------------------------------------------------------------------------------ No results for input(s): VITAMINB12, FOLATE, FERRITIN, TIBC, IRON, RETICCTPCT in the last 72 hours.  Coagulation profile No results for  input(s): INR, PROTIME in the last 168 hours.  No results for input(s): DDIMER in the last 72 hours.  Cardiac Enzymes  Recent Labs Lab 04/20/16 0541  TROPONINI <0.03   ------------------------------------------------------------------------------------------------------------------ Invalid input(s): POCBNP    Assessment & Plan   IMPRESSION AND PLAN: Patient is a 71 year old with nicotine abuse presents with progressive shortness of breath  1. Acute respiratory failure Due to acute COPD exasperation with acute bronchitis Continue therapy with nebulizers steroids and antibiotics.  2. Influenza I will place patient on TheraFlu  3. Elevated creatinine no baseline available possible acute renal failure through with IV fluids blood pressure medications on hold  4. Diabetes type II Will  on sliding scale insulin continue home regimen  5. Depression wellbutrin  6.nicotine abuse smoking cessation provided      Code Status Orders        Start     Ordered   04/20/16 1157  Full code  Continuous     04/20/16 1157    Code Status History    Date Active Date Inactive Code Status Order ID Comments User Context   This patient has a current code status but no historical code status.    Advance Directive Documentation   Flowsheet Row Most Recent Value  Type of Advance Directive  Healthcare Power of Attorney  Pre-existing out of facility DNR order (yellow form or pink MOST form)  No data  "MOST" Form in Place?  No data           Consults  none   DVT Prophylaxis  Lovenox   Lab Results  Component Value Date   PLT 178  04/21/2016     Time Spent in minutes   33min  Greater than 50% of time spent in care coordination and counseling patient regarding the condition and plan of care.   Dustin Flock M.D on 04/21/2016 at 11:54 AM  Between 7am to 6pm - Pager - [phone]  After 6pm go to [email or web address] - password [email or web address] [facility]   Office  [phone]

## 2016-04-21 NOTE — Progress Notes (Signed)
Inpatient Diabetes Program Recommendations  AACE/ADA: New Consensus Statement on Inpatient Glycemic Control (2015)  Target Ranges:  Prepandial:   less than 140 mg/dL      Peak postprandial:   less than 180 mg/dL (1-2 hours)      Critically ill patients:  140 - 180 mg/dL   Results for Miranda Ryan, Miranda Ryan (MRN [id number]) as of 04/21/2016 08:10  Ref. Range 04/20/2016 12:03 04/20/2016 17:05 04/20/2016 21:40 04/21/2016 07:44  Glucose-Capillary Latest Ref Range: 65 - 99 mg/dL 299 (H) 285 (H) 284 (H) 240 (H)   Review of Glycemic Control  Diabetes history: DM2 Outpatient Diabetes medications: Januvia 25 mg daily Current orders for Inpatient glycemic control: Novolog 0-9 units TID with meals, Tradjenta 5 mg daily  Inpatient Diabetes Program Recommendations: Insulin - Basal: If steroids are continued, please consider ordering Levemir 7 units daily (based on 73.9 kg x 0.1 units). Please note that if Levemir is ordered as recommended, it will likely need to be adjusted as steroids are tapered. Correction (SSI): Please consider ordering Novolog 0-5 units QHS for bedtime correction. Insulin - Meal Coverage: If steroids are continued, please consider ordering Novolog 3 units TID with meals for meal coverage if patient eats at least 50% of meals. HgbA1C: Please consider ordering an A1C to evaluate glycemic control over the past 2-3 months.  Thanks, Barnie Alderman, RN, MSN, CDE Diabetes Coordinator Inpatient Diabetes Program [phone] (Team Pager from 8am to 5pm)

## 2016-04-21 NOTE — Progress Notes (Signed)
PHARMACY NOTE:  RENAL DOSAGE ADJUSTMENT  Current Medication dosage:  Tamiflu 75mg  po bid  Indication: Influenza B  Renal Function:   Estimated Creatinine Clearance: 25.1 mL/min (by C-G formula based on SCr of 1.89 mg/dL (H)).     Antimicrobial dosage has been changed to:   Tamiflu 30 mg ONCE daily for Crcl 10-30 ml/min   Thank you for allowing pharmacy to be a part of this patient's care.  Iyad Deroo A, [facility] 04/21/2016 10:30 AM

## 2016-04-22 LAB — GLUCOSE, CAPILLARY
GLUCOSE-CAPILLARY: 223 mg/dL — AB (ref 65–99)
Glucose-Capillary: 217 mg/dL — ABNORMAL HIGH (ref 65–99)
Glucose-Capillary: 221 mg/dL — ABNORMAL HIGH (ref 65–99)
Glucose-Capillary: 225 mg/dL — ABNORMAL HIGH (ref 65–99)

## 2016-04-22 LAB — BASIC METABOLIC PANEL
Anion gap: 8 (ref 5–15)
BUN: 54 mg/dL — ABNORMAL HIGH (ref 6–20)
CALCIUM: 7.9 mg/dL — AB (ref 8.9–10.3)
CHLORIDE: 108 mmol/L (ref 101–111)
CO2: 19 mmol/L — ABNORMAL LOW (ref 22–32)
CREATININE: 2.06 mg/dL — AB (ref 0.44–1.00)
GFR calc Af Amer: 27 mL/min — ABNORMAL LOW (ref >60)
GFR, EST NON AFRICAN AMERICAN: 23 mL/min — AB (ref >60)
Glucose, Bld: 240 mg/dL — ABNORMAL HIGH (ref 65–99)
Potassium: 4.5 mmol/L (ref 3.5–5.1)
SODIUM: 135 mmol/L (ref 135–145)

## 2016-04-22 LAB — CBC
HCT: 32.7 % — ABNORMAL LOW (ref 35.0–47.0)
HEMOGLOBIN: 11 g/dL — AB (ref 12.0–16.0)
MCH: 28.8 pg (ref 26.0–34.0)
MCHC: 33.7 g/dL (ref 32.0–36.0)
MCV: 85.4 fL (ref 80.0–100.0)
Platelets: 179 10*3/uL (ref 150–440)
RBC: 3.83 MIL/uL (ref 3.80–5.20)
RDW: 15.1 % — ABNORMAL HIGH (ref 11.5–14.5)
WBC: 28.1 10*3/uL — ABNORMAL HIGH (ref 3.6–11.0)

## 2016-04-22 MED ORDER — PREDNISONE 50 MG PO TABS
50.0000 mg | ORAL_TABLET | Freq: Every day | ORAL | Status: DC
  Administered 2016-04-23: 50 mg via ORAL
  Filled 2016-04-22: qty 1

## 2016-04-22 NOTE — Progress Notes (Signed)
SATURATION QUALIFICATIONS: (This note is used to comply with regulatory documentation for home oxygen)  Patient Saturations on Room Air at Rest = 94%  Patient Saturations on Room Air while Ambulating = 95%  Patient Saturations on 0 Liters of oxygen while Ambulating = 92%-95%  Please briefly explain why patient needs home oxygen:

## 2016-04-22 NOTE — Care Management (Signed)
Admitted to [facility] with the diagnosis of acute exacerbation of chronic obstructive pulmonary disease. Lives with daughter, [name] [phone]). Diagnosed with Flu  04/12/16. Goes to [facility] for physician care. No home Health. No skilled facility. No home oxygen. No equipment in the home. Takes care of all basic activities of daily living herself. Daughter will transport. Shelbie Ammons RN MSN CCM Care Management

## 2016-04-22 NOTE — Progress Notes (Signed)
Inpatient Diabetes Program Recommendations  AACE/ADA: New Consensus Statement on Inpatient Glycemic Control (2015)  Target Ranges:  Prepandial:   less than 140 mg/dL      Peak postprandial:   less than 180 mg/dL (1-2 hours)      Critically ill patients:  140 - 180 mg/dL   Lab Results  Component Value Date   GLUCAP 217 (H) 04/22/2016   Review of Glycemic Control  Results for YATASHA, GRADO (MRN [id number]) as of 04/22/2016 09:27  Ref. Range 04/21/2016 07:44 04/21/2016 11:30 04/21/2016 16:34 04/21/2016 21:06 04/22/2016 07:35  Glucose-Capillary Latest Ref Range: 65 - 99 mg/dL 240 (H) 290 (H) 242 (H) 206 (H) 217 (H)    Diabetes history: DM2 Outpatient Diabetes medications: Januvia 25 mg daily Current orders for Inpatient glycemic control: Novolog 0-9 units TID with meals, Tradjenta 5 mg daily  Inpatient Diabetes Program Recommendations: If steroids are continued, please consider ordering Levemir 7 units daily (based on 73.9 kg x 0.1 units). Please note that if Levemir is ordered as recommended, it will likely need to be adjusted as steroids are tapered.   Please consider ordering Novolog 0-5 units QHS for bedtime correction.   If steroids are continued, please consider ordering Novolog 3 units TID with meals for meal coverage if patient eats at least 50% of meals.  Please consider ordering an A1C to evaluate glycemic control over the past 2-3 months.  Gentry Fitz, RN, BA, MHA, CDE Diabetes Coordinator Inpatient Diabetes Program  [phone] (Team Pager) [phone] ([place]) 04/22/2016 9:28 AM

## 2016-04-22 NOTE — Care Management Important Message (Signed)
Important Message  Patient Details  Name: MAYLEEN POWDERLY MRN: [id number] Date of Birth: [birth year]   Medicare Important Message Given:  Yes    Shelbie Ammons, RN 04/22/2016, 8:15 AM

## 2016-04-22 NOTE — Progress Notes (Signed)
[place] at [facility]                                                                                                                                                                                  Patient Demographics   Miranda Ryan, is a 71 y.o. female, DOB - 03-01-1945, [id number]  Admit date - 04/20/2016   Admitting Physician Dustin Flock, MD  Outpatient Primary MD for the patient is Parrott   LOS - 2  Subjective: Pt feeling better sob improved wbc elevated    Review of Systems:   CONSTITUTIONAL: No documented fever. No fatigue, weakness. No weight gain, no weight loss.  EYES: No blurry or double vision.  ENT: No tinnitus. No postnasal drip. No redness of the oropharynx.  RESPIRATORY: Positive cough, no wheeze, no hemoptysis. Improved dyspnea.  CARDIOVASCULAR: No chest pain. No orthopnea. No palpitations. No syncope.  GASTROINTESTINAL: No nausea, no vomiting or diarrhea. No abdominal pain. No melena or hematochezia.  GENITOURINARY: No dysuria or hematuria.  ENDOCRINE: No polyuria or nocturia. No heat or cold intolerance.  HEMATOLOGY: No anemia. No bruising. No bleeding.  INTEGUMENTARY: No rashes. No lesions.  MUSCULOSKELETAL: No arthritis. No swelling. No gout.  NEUROLOGIC: No numbness, tingling, or ataxia. No seizure-type activity.  PSYCHIATRIC: No anxiety. No insomnia. No ADD.    Vitals:   Vitals:   04/22/16 0205 04/22/16 0741 04/22/16 0752 04/22/16 1042  BP:  118/67    Pulse:  (!) 107  (!) 101  Resp:  20    Temp:  97.8 F (36.6 C)    TempSrc:  Oral    SpO2: 96% 99% 96% 94%  Weight:      Height:        Wt Readings from Last 3 Encounters:  04/20/16 162 lb 14.7 oz (73.9 kg)  11/29/15 115 lb (52.2 kg)     Intake/Output Summary (Last 24 hours) at 04/22/16 1138 Last data filed at 04/22/16 1042  Gross per 24 hour  Intake                0 ml  Output             1600 ml  Net            -1600 ml    Physical  Exam:   GENERAL: Pleasant-appearing in no apparent distress.  HEAD, EYES, EARS, NOSE AND THROAT: Atraumatic, normocephalic. Extraocular muscles are intact. Pupils equal and reactive to light. Sclerae anicteric. No conjunctival injection. No oro-pharyngeal erythema.  NECK: Supple. There is no jugular venous distention. No bruits, no lymphadenopathy, no thyromegaly.  HEART: Regular rate and rhythm,. No murmurs,  no rubs, no clicks.  LUNGS: Bilateral wheezing throughout both lungs  ABDOMEN: Soft, flat, nontender, nondistended. Has good bowel sounds. No hepatosplenomegaly appreciated.  EXTREMITIES: No evidence of any cyanosis, clubbing, or peripheral edema.  +2 pedal and radial pulses bilaterally.  NEUROLOGIC: The patient is alert, awake, and oriented x3 with no focal motor or sensory deficits appreciated bilaterally.  SKIN: Moist and warm with no rashes appreciated.  Psych: Not anxious, depressed LN: No inguinal LN enlargement    Antibiotics   Anti-infectives    Start     Dose/Rate Route Frequency Ordered Stop   04/21/16 1045  oseltamivir (TAMIFLU) capsule 30 mg     30 mg Oral Daily 04/21/16 1030     04/21/16 1000  oseltamivir (TAMIFLU) capsule 75 mg  Status:  Discontinued     75 mg Oral 2 times daily 04/21/16 0921 04/21/16 1030   04/20/16 1100  azithromycin (ZITHROMAX) tablet 500 mg     500 mg Oral Daily 04/20/16 1008     04/20/16 0545  cefTRIAXone (ROCEPHIN) 1 g in dextrose 5 % 50 mL IVPB  Status:  Discontinued     1 g 100 mL/hr over 30 Minutes Intravenous  Once 04/20/16 0540 04/20/16 0544   04/20/16 0545  azithromycin (ZITHROMAX) 500 mg in dextrose 5 % 250 mL IVPB     500 mg 250 mL/hr over 60 Minutes Intravenous  Once 04/20/16 0540 04/20/16 0731   04/20/16 0545  cefTRIAXone (ROCEPHIN) IVPB 1 g     1 g 100 mL/hr over 30 Minutes Intravenous  Once 04/20/16 0544 04/20/16 0629      Medications   Scheduled Meds: . azithromycin  500 mg Oral Daily  . budesonide (PULMICORT) nebulizer  solution  0.25 mg Nebulization BID  . buPROPion  100 mg Oral q morning - 10a  . enoxaparin (LOVENOX) injection  30 mg Subcutaneous Q24H  . guaiFENesin  600 mg Oral BID  . insulin aspart  0-9 Units Subcutaneous TID WC  . ipratropium-albuterol  3 mL Nebulization Q6H  . linagliptin  5 mg Oral Daily  . midodrine  2.5 mg Oral TID WC  . oseltamivir  30 mg Oral Daily  . [START ON 04/23/2016] predniSONE  50 mg Oral Q breakfast  . sodium chloride flush  3 mL Intravenous Q12H  . tiotropium  18 mcg Inhalation Daily   Continuous Infusions:  PRN Meds:.sodium chloride, acetaminophen **OR** acetaminophen, benzonatate, guaiFENesin-dextromethorphan, ondansetron **OR** ondansetron (ZOFRAN) IV, sodium chloride flush   Data Review:   Micro Results Recent Results (from the past 240 hour(s))  Blood culture (routine x 2)     Status: None (Preliminary result)   Collection Time: 04/20/16  5:41 AM  Result Value Ref Range Status   Specimen Description BLOOD R ARM  Final   Special Requests BOTTLES DRAWN AEROBIC AND ANAEROBIC BCAV  Final   Culture NO GROWTH 2 DAYS  Final   Report Status PENDING  Incomplete  Blood culture (routine x 2)     Status: None (Preliminary result)   Collection Time: 04/20/16  5:42 AM  Result Value Ref Range Status   Specimen Description BLOOD R AC  Final   Special Requests BOTTLES DRAWN AEROBIC AND ANAEROBIC BCHV  Final   Culture NO GROWTH 2 DAYS  Final   Report Status PENDING  Incomplete    Radiology Reports US Venous Img Lower Bilateral  Result Date: 04/20/2016 CLINICAL DATA:  Lower extremity swelling. EXAM: BILATERAL LOWER EXTREMITY VENOUS DOPPLER ULTRASOUND TECHNIQUE: Gray-scale sonography with  graded compression, as well as color Doppler and duplex ultrasound were performed to evaluate the lower extremity deep venous systems from the level of the common femoral vein and including the common femoral, femoral, profunda femoral, popliteal and calf veins including the posterior  tibial, peroneal and gastrocnemius veins when visible. The superficial great saphenous vein was also interrogated. Spectral Doppler was utilized to evaluate flow at rest and with distal augmentation maneuvers in the common femoral, femoral and popliteal veins. COMPARISON:  None. FINDINGS: RIGHT LOWER EXTREMITY Common Femoral Vein: No evidence of thrombus. Normal compressibility, respiratory phasicity and response to augmentation. Saphenofemoral Junction: No evidence of thrombus. Normal compressibility and flow on color Doppler imaging. Profunda Femoral Vein: No evidence of thrombus. Normal compressibility and flow on color Doppler imaging. Femoral Vein: No evidence of thrombus. Normal compressibility, respiratory phasicity and response to augmentation. Popliteal Vein: No evidence of thrombus. Normal compressibility, respiratory phasicity and response to augmentation. Calf Veins: No evidence of thrombus. Normal compressibility and flow on color Doppler imaging. Superficial Great Saphenous Vein: No evidence of thrombus. Normal compressibility and flow on color Doppler imaging. Venous Reflux:  None. Other Findings:  None. LEFT LOWER EXTREMITY Common Femoral Vein: No evidence of thrombus. Normal compressibility, respiratory phasicity and response to augmentation. Saphenofemoral Junction: No evidence of thrombus. Normal compressibility and flow on color Doppler imaging. Profunda Femoral Vein: No evidence of thrombus. Normal compressibility and flow on color Doppler imaging. Femoral Vein: No evidence of thrombus. Normal compressibility, respiratory phasicity and response to augmentation. Popliteal Vein: No evidence of thrombus. Normal compressibility, respiratory phasicity and response to augmentation. Calf Veins: Posterior tibial veins patent. Peroneal vein not well visualized. Superficial Great Saphenous Vein: No evidence of thrombus. Normal compressibility and flow on color Doppler imaging. Venous Reflux:  None. Other  Findings:  None. IMPRESSION: No evidence of deep venous thrombosis. Electronically Signed   By: Rolm Baptise M.D.   On: 04/20/2016 10:41   Dg Chest Port 1 View  Result Date: 04/20/2016 CLINICAL DATA:  Acute onset of difficulty breathing. Fever. Initial encounter. EXAM: PORTABLE CHEST 1 VIEW COMPARISON:  Chest radiograph performed 11/29/2015 FINDINGS: The lungs are well-aerated and clear. There is no evidence of focal opacification, pleural effusion or pneumothorax. The cardiomediastinal silhouette is within normal limits. No acute osseous abnormalities are seen. IMPRESSION: No acute cardiopulmonary process seen. Electronically Signed   By: Garald Balding M.D.   On: 04/20/2016 05:58     CBC  Recent Labs Lab 04/20/16 0541 04/21/16 0338 04/22/16 0511  WBC 6.1 16.3* 28.1*  HGB 12.4 11.1* 11.0*  HCT 38.0 33.3* 32.7*  PLT 191 178 179  MCV 86.6 85.4 85.4  MCH 28.4 28.5 28.8  MCHC 32.7 33.3 33.7  RDW 14.9* 14.6* 15.1*    Chemistries   Recent Labs Lab 04/20/16 0541 04/21/16 0338 04/22/16 0511  NA 135 134* 135  K 4.9 4.9 4.5  CL 104 107 108  CO2 20* 20* 19*  GLUCOSE 183* 270* 240*  BUN 39* 47* 54*  CREATININE 2.20* 1.89* 2.06*  CALCIUM 8.3* 7.9* 7.9*   ------------------------------------------------------------------------------------------------------------------ estimated creatinine clearance is 23 mL/min (by C-G formula based on SCr of 2.06 mg/dL (H)). ------------------------------------------------------------------------------------------------------------------ No results for input(s): HGBA1C in the last 72 hours. ------------------------------------------------------------------------------------------------------------------ No results for input(s): CHOL, HDL, LDLCALC, TRIG, CHOLHDL, LDLDIRECT in the last 72 hours. ------------------------------------------------------------------------------------------------------------------ No results for input(s): TSH, T4TOTAL,  T3FREE, THYROIDAB in the last 72 hours.  Invalid input(s): FREET3 ------------------------------------------------------------------------------------------------------------------ No results for input(s): VITAMINB12, FOLATE, FERRITIN, TIBC, IRON, RETICCTPCT  in the last 72 hours.  Coagulation profile No results for input(s): INR, PROTIME in the last 168 hours.  No results for input(s): DDIMER in the last 72 hours.  Cardiac Enzymes  Recent Labs Lab 04/20/16 0541  TROPONINI <0.03   ------------------------------------------------------------------------------------------------------------------ Invalid input(s): POCBNP    Assessment & Plan   IMPRESSION AND PLAN: Patient is a 71 year old with nicotine abuse presents with progressive shortness of breath  1. Acute respiratory failure Due to acute COPD exasperation with acute bronchitis Patient still has wheezing but improved I will taper her steroids Continue nebulizer therapy  2. Influenza continue therapy with Tamiflu Elevated WBC count I suspect this is due to steroid therapy I will stopped IV steroids start patient on oral steroids  3. Chronic kidney disease stage IV based on her GFR, I called her primary care's office a last medical they state that her renal function in February showed a creatinine of 2.04, she'll need outpatient nephrology follow-up on discharge  4. Diabetes type II Will  on sliding scale insulin continue home regimen  5. Depression wellbutrin  6.nicotine abuse smoking cessation provided      Code Status Orders        Start     Ordered   04/20/16 1157  Full code  Continuous     04/20/16 1157    Code Status History    Date Active Date Inactive Code Status Order ID Comments User Context   This patient has a current code status but no historical code status.    Advance Directive Documentation   Flowsheet Row Most Recent Value  Type of Advance Directive  Healthcare Power of Attorney   Pre-existing out of facility DNR order (yellow form or pink MOST form)  No data  "MOST" Form in Place?  No data           Consults  none   DVT Prophylaxis  Lovenox   Lab Results  Component Value Date   PLT 179 04/22/2016     Time Spent in minutes   27min  Greater than 50% of time spent in care coordination and counseling patient regarding the condition and plan of care.   Dustin Flock M.D on 04/22/2016 at 11:38 AM  Between 7am to 6pm - Pager - [phone]  After 6pm go to [email or web address] - password [email or web address] [facility]   Office  [phone]

## 2016-04-23 ENCOUNTER — Telehealth: Payer: Self-pay | Admitting: Internal Medicine

## 2016-04-23 LAB — BASIC METABOLIC PANEL
Anion gap: 7 (ref 5–15)
BUN: 51 mg/dL — AB (ref 6–20)
CO2: 20 mmol/L — ABNORMAL LOW (ref 22–32)
CREATININE: 1.68 mg/dL — AB (ref 0.44–1.00)
Calcium: 8.2 mg/dL — ABNORMAL LOW (ref 8.9–10.3)
Chloride: 110 mmol/L (ref 101–111)
GFR calc non Af Amer: 30 mL/min — ABNORMAL LOW (ref >60)
GFR, EST AFRICAN AMERICAN: 34 mL/min — AB (ref >60)
Glucose, Bld: 214 mg/dL — ABNORMAL HIGH (ref 65–99)
Potassium: 4.1 mmol/L (ref 3.5–5.1)
SODIUM: 137 mmol/L (ref 135–145)

## 2016-04-23 LAB — CBC
HCT: 34.3 % — ABNORMAL LOW (ref 35.0–47.0)
Hemoglobin: 11 g/dL — ABNORMAL LOW (ref 12.0–16.0)
MCH: 27.8 pg (ref 26.0–34.0)
MCHC: 32.2 g/dL (ref 32.0–36.0)
MCV: 86.2 fL (ref 80.0–100.0)
Platelets: 157 10*3/uL (ref 150–440)
RBC: 3.98 MIL/uL (ref 3.80–5.20)
RDW: 15 % — ABNORMAL HIGH (ref 11.5–14.5)
WBC: 19.8 10*3/uL — ABNORMAL HIGH (ref 3.6–11.0)

## 2016-04-23 LAB — GLUCOSE, CAPILLARY
GLUCOSE-CAPILLARY: 122 mg/dL — AB (ref 65–99)
Glucose-Capillary: 190 mg/dL — ABNORMAL HIGH (ref 65–99)

## 2016-04-23 MED ORDER — GUAIFENESIN ER 600 MG PO TB12: 600.0000 mg | ORAL_TABLET | Freq: Two times a day (BID) | ORAL | 0 refills | Status: DC

## 2016-04-23 MED ORDER — IPRATROPIUM-ALBUTEROL 20-100 MCG/ACT IN AERS: 1.0000 | INHALATION_SPRAY | Freq: Four times a day (QID) | RESPIRATORY_TRACT | 0 refills | Status: DC | PRN

## 2016-04-23 MED ORDER — OSELTAMIVIR PHOSPHATE 30 MG PO CAPS: 30.0000 mg | ORAL_CAPSULE | Freq: Every day | ORAL | 0 refills | Status: DC

## 2016-04-23 MED ORDER — PREDNISONE 10 MG (21) PO TBPK: ORAL_TABLET | ORAL | 0 refills | Status: DC

## 2016-04-23 MED ORDER — GUAIFENESIN-DM 100-10 MG/5ML PO SYRP: 15.0000 mL | ORAL_SOLUTION | ORAL | 0 refills | Status: DC | PRN

## 2016-04-23 NOTE — Progress Notes (Signed)
Inpatient Diabetes Program Recommendations  AACE/ADA: New Consensus Statement on Inpatient Glycemic Control (2015)  Target Ranges:  Prepandial:   less than 140 mg/dL      Peak postprandial:   less than 180 mg/dL (1-2 hours)      Critically ill patients:  140 - 180 mg/dL   Results for Miranda Ryan, Miranda Ryan (MRN [id number]) as of 04/23/2016 09:17  Ref. Range 04/22/2016 07:35 04/22/2016 11:45 04/22/2016 16:42 04/22/2016 21:01 04/23/2016 07:39  Glucose-Capillary Latest Ref Range: 65 - 99 mg/dL 217 (H) 223 (H) 221 (H) 225 (H) 190 (H)   Review of Glycemic Control  Diabetes history: DM2 Outpatient Diabetes medications: Januvia 25 mg daily Current orders for Inpatient glycemic control: Novolog 0-9 units TID with meals, Tradjenta 5 mg daily  Inpatient Diabetes Program Recommendations:  Correction (SSI): Please consider ordering Novolog bedtime correction scale. Insulin - Meal Coverage: Noted steroids decreased and changed to PO. If steroids are continued, please consider ordering Novolog 4 units TID with meals for meal coverage. HgbA1C: Please consider ordering an A1C to evaluate glycemic control over the past 2-3 months.  Thanks, Barnie Alderman, RN, MSN, CDE Diabetes Coordinator Inpatient Diabetes Program [phone] (Team Pager from 8am to 5pm)

## 2016-04-23 NOTE — Telephone Encounter (Signed)
Please schedule pt for 3/20 @ 11:30AM WITH [place]. Thanks.

## 2016-04-23 NOTE — Progress Notes (Signed)
Pt walked down hall to equipment supply room and back at steady pace without difficulty, O2 saturation fluctuated between 89 and 91%

## 2016-04-23 NOTE — Evaluation (Signed)
Physical Therapy Evaluation Patient Details Name: Miranda Ryan MRN: [id number] DOB: 11/11/1945 Today's Date: 04/23/2016   History of Present Illness  Pt is a 71 y.o. female presenting to hospital 04/20/16 with respiratory distress (pt with recent diagnosis 04/12/16 with influenza B).  Pt admitted with acute respiratory failure d/t acute COPD exacerbation with acute bronchitis.  PMH includes anxiety, bipolar disorder, depression, DM, h/o smoking.  Clinical Impression  Prior to hospital admission, pt was independent.  Pt lives with her daughter in 1 level home.  Currently pt is independent with bed mobility and transfers and SBA with ambulation 150 feet no AD (pt steady without loss of balance).  Pt's O2 90% resting in bed and decreased to 85-86% post ambulation (all on room air) and required a few minutes sitting rest break to increase O2 back to 90% (on room air); nursing notified regarding pt's O2 sats during session.  Pt requiring consistent vc's for pursed lip breathing technique to improve O2 saturations during session's activities. Recommend pt discharge to home when medically appropriate; no PT needs anticipated upon hospital discharge.    Follow Up Recommendations No PT follow up    Equipment Recommendations  None recommended by PT    Recommendations for Other Services       Precautions / Restrictions Precautions Precautions: Fall Restrictions Weight Bearing Restrictions: No      Mobility  Bed Mobility Overal bed mobility: Independent             General bed mobility comments: Supine to/from sit with bed flat; no difficulties.  Transfers Overall transfer level: Independent Equipment used: None             General transfer comment: Sit to/from stand and stand step turn no AD steady without loss of balance  Ambulation/Gait Ambulation/Gait assistance: Supervision Ambulation Distance (Feet): 150 Feet Assistive device: None   Gait velocity: mildly decreased    General Gait Details: mild decreased B step length; steady without loss of balance  Stairs            Wheelchair Mobility    Modified Rankin (Stroke Patients Only)       Balance Overall balance assessment: No apparent balance deficits (not formally assessed) (No loss of balance with ambulation and head turns R/L/up/down.)                                           Pertinent Vitals/Pain Pain Assessment: No/denies pain  HR 102-120 bpm during session.    Home Living Family/patient expects to be discharged to:: Private residence Living Arrangements: Children (Lives with daughter) Available Help at Discharge: Family Type of Home: House Home Access: Level entry     Home Layout: One level        Prior Function Level of Independence: Independent         Comments: Pt denies any falls in past 6 months.     Hand Dominance        Extremity/Trunk Assessment   Upper Extremity Assessment Upper Extremity Assessment: Overall WFL for tasks assessed    Lower Extremity Assessment Lower Extremity Assessment: Overall WFL for tasks assessed       Communication   Communication: No difficulties  Cognition Arousal/Alertness: Awake/alert Behavior During Therapy: WFL for tasks assessed/performed Overall Cognitive Status: Within Functional Limits for tasks assessed  General Comments   Nursing cleared pt for participation in physical therapy.  Pt agreeable to PT session.    Exercises     Assessment/Plan    PT Assessment Patient needs continued PT services  PT Problem List Cardiopulmonary status limiting activity       PT Treatment Interventions Gait training;Functional mobility training;Therapeutic activities;Therapeutic exercise;Patient/family education    PT Goals (Current goals can be found in the Care Plan section)  Acute Rehab PT Goals Patient Stated Goal: to go home PT Goal Formulation: With patient Time For  Goal Achievement: 05/07/16 Potential to Achieve Goals: Good    Frequency Min 2X/week   Barriers to discharge        Co-evaluation               End of Session Equipment Utilized During Treatment: Gait belt Activity Tolerance: Patient tolerated treatment well Patient left: in bed;with call bell/phone within reach;with bed alarm set Nurse Communication: Mobility status;Precautions (O2 saturations during session.) PT Visit Diagnosis: Difficulty in walking, not elsewhere classified (R26.2)         Time: [id number] PT Time Calculation (min) (ACUTE ONLY): 15 min   Charges:   PT Evaluation $PT Eval Low Complexity: 1 Procedure     PT G CodesLeitha Bleak, PT 04/23/16, 9:17 AM [phone]

## 2016-04-23 NOTE — Care Management (Signed)
Discharge to home today per Dr. Posey Pronto. Physical therapy evaluation completed. No follow-up physical therapy needs. Doesn't qualifyt for home oxygen. Family will transport Miranda Ammons RN MSN CCM Care Management

## 2016-04-23 NOTE — Discharge Instructions (Signed)
[facility] - [place] at [facility] ° °DIET:  °Cardiac diet ° °DISCHARGE CONDITION:  °Stable ° °ACTIVITY:  °Activity as tolerated ° °OXYGEN:  °Home Oxygen: No. °  °Oxygen Delivery: room air ° °DISCHARGE LOCATION:  °home  ° ° °ADDITIONAL DISCHARGE INSTRUCTION: ° ° °If you experience worsening of your admission symptoms, develop shortness of breath, life threatening emergency, suicidal or homicidal thoughts you must seek medical attention immediately by calling 911 or calling your MD immediately  if symptoms less severe. ° °You Must read complete instructions/literature along with all the possible adverse reactions/side effects for all the Medicines you take and that have been prescribed to you. Take any new Medicines after you have completely understood and accpet all the possible adverse reactions/side effects.  ° °Please note ° °You were cared for by a hospitalist during your hospital stay. If you have any questions about your discharge medications or the care you received while you were in the hospital after you are discharged, you can call the unit and asked to speak with the hospitalist on call if the hospitalist that took care of you is not available. Once you are discharged, your primary care physician will handle any further medical issues. Please note that NO REFILLS for any discharge medications will be authorized once you are discharged, as it is imperative that you return to your primary care physician (or establish a relationship with a primary care physician if you do not have one) for your aftercare needs so that they can reassess your need for medications and monitor your lab values. ° ° °

## 2016-04-23 NOTE — Discharge Summary (Signed)
[place] at [facility], 71 y.o., DOB 20-Apr-1945, MRN [id number]. Admission date: 04/20/2016 Discharge Date 04/23/2016 Primary MD Baker Admitting Physician Dustin Flock, MD  Admission Diagnosis  Swelling [R60.9]  Discharge Diagnosis   Active Problems:   Acute exacerbation of chronic obstructive pulmonary disease (COPD) ([place])   Acute bronchitis   Influenza A infection Chronic kidney disease stage III Leukocytosis due to steroid therapy Nicotine abuse Hypotension      Hospital Course  Miranda Ryan  is a 71 y.o. female with a known history of  Nicotine Abuse but no lung disease according to her. She presents progressive shortness of breath. She reports that about 5 days ago she started with upper respiratory infection with cough congestion as well as fevers. Over the past 2 days she is having more shortness of breath which is gotten worse. Patient states that she does not have a history of COPD however she was wheezing significantly. She was noted to have positive influenza A. She was admitted to the hospital started on therapy with nebulizers steroids antibiotics and treatment for her flu. She started to improve now she is not requiring any oxygen initially she was requiring 3-4 L of oxygen. She will need outpatient follow-up with pulmonary in the near future to evaluate for possible underlying lung disease           Consults  None  Significant Tests:  See full reports for all details     US Venous Img Lower Bilateral  Result Date: 04/20/2016 CLINICAL DATA:  Lower extremity swelling. EXAM: BILATERAL LOWER EXTREMITY VENOUS DOPPLER ULTRASOUND TECHNIQUE: Gray-scale sonography with graded compression, as well as color Doppler and duplex ultrasound were performed to evaluate the lower extremity deep venous systems from the level of the common femoral vein and including the common femoral, femoral, profunda femoral, popliteal  and calf veins including the posterior tibial, peroneal and gastrocnemius veins when visible. The superficial great saphenous vein was also interrogated. Spectral Doppler was utilized to evaluate flow at rest and with distal augmentation maneuvers in the common femoral, femoral and popliteal veins. COMPARISON:  None. FINDINGS: RIGHT LOWER EXTREMITY Common Femoral Vein: No evidence of thrombus. Normal compressibility, respiratory phasicity and response to augmentation. Saphenofemoral Junction: No evidence of thrombus. Normal compressibility and flow on color Doppler imaging. Profunda Femoral Vein: No evidence of thrombus. Normal compressibility and flow on color Doppler imaging. Femoral Vein: No evidence of thrombus. Normal compressibility, respiratory phasicity and response to augmentation. Popliteal Vein: No evidence of thrombus. Normal compressibility, respiratory phasicity and response to augmentation. Calf Veins: No evidence of thrombus. Normal compressibility and flow on color Doppler imaging. Superficial Great Saphenous Vein: No evidence of thrombus. Normal compressibility and flow on color Doppler imaging. Venous Reflux:  None. Other Findings:  None. LEFT LOWER EXTREMITY Common Femoral Vein: No evidence of thrombus. Normal compressibility, respiratory phasicity and response to augmentation. Saphenofemoral Junction: No evidence of thrombus. Normal compressibility and flow on color Doppler imaging. Profunda Femoral Vein: No evidence of thrombus. Normal compressibility and flow on color Doppler imaging. Femoral Vein: No evidence of thrombus. Normal compressibility, respiratory phasicity and response to augmentation. Popliteal Vein: No evidence of thrombus. Normal compressibility, respiratory phasicity and response to augmentation. Calf Veins: Posterior tibial veins patent. Peroneal vein not well visualized. Superficial Great Saphenous Vein: No evidence of thrombus. Normal compressibility and flow on color  Doppler imaging. Venous Reflux:  None. Other Findings:  None. IMPRESSION: No evidence of deep venous  thrombosis. Electronically Signed   By: Rolm Baptise M.D.   On: 04/20/2016 10:41   Dg Chest Port 1 View  Result Date: 04/20/2016 CLINICAL DATA:  Acute onset of difficulty breathing. Fever. Initial encounter. EXAM: PORTABLE CHEST 1 VIEW COMPARISON:  Chest radiograph performed 11/29/2015 FINDINGS: The lungs are well-aerated and clear. There is no evidence of focal opacification, pleural effusion or pneumothorax. The cardiomediastinal silhouette is within normal limits. No acute osseous abnormalities are seen. IMPRESSION: No acute cardiopulmonary process seen. Electronically Signed   By: Garald Balding M.D.   On: 04/20/2016 05:58       Today   Subjective:   Miranda Ryan  patient feels much better shortness of breath and cough resolved  Objective:   Blood pressure 134/85, pulse (!) 102, temperature 97.7 F (36.5 C), temperature source Oral, resp. rate 19, height 5\' 1"  (1.549 m), weight 162 lb 14.7 oz (73.9 kg), SpO2 96 %.  .  Intake/Output Summary (Last 24 hours) at 04/23/16 1221 Last data filed at 04/23/16 0900  Gross per 24 hour  Intake                9 ml  Output              400 ml  Net             -391 ml    Exam VITAL SIGNS: Blood pressure 134/85, pulse (!) 102, temperature 97.7 F (36.5 C), temperature source Oral, resp. rate 19, height 5\' 1"  (1.549 m), weight 162 lb 14.7 oz (73.9 kg), SpO2 96 %.  GENERAL:  71 y.o.-year-old patient lying in the bed with no acute distress.  EYES: Pupils equal, round, reactive to light and accommodation. No scleral icterus. Extraocular muscles intact.  HEENT: Head atraumatic, normocephalic. Oropharynx and nasopharynx clear.  NECK:  Supple, no jugular venous distention. No thyroid enlargement, no tenderness.  LUNGS: Normal breath sounds bilaterally, no wheezing, rales,rhonchi or crepitation. No use of accessory muscles of respiration.   CARDIOVASCULAR: S1, S2 normal. No murmurs, rubs, or gallops.  ABDOMEN: Soft, nontender, nondistended. Bowel sounds present. No organomegaly or mass.  EXTREMITIES: No pedal edema, cyanosis, or clubbing.  NEUROLOGIC: Cranial nerves II through XII are intact. Muscle strength 5/5 in all extremities. Sensation intact. Gait not checked.  PSYCHIATRIC: The patient is alert and oriented x 3.  SKIN: No obvious rash, lesion, or ulcer.   Data Review     CBC w Diff: Lab Results  Component Value Date   WBC 19.8 (H) 04/23/2016   HGB 11.0 (L) 04/23/2016   HCT 34.3 (L) 04/23/2016   PLT 157 04/23/2016   CMP: Lab Results  Component Value Date   NA 137 04/23/2016   K 4.1 04/23/2016   CL 110 04/23/2016   CO2 20 (L) 04/23/2016   BUN 51 (H) 04/23/2016   CREATININE 1.68 (H) 04/23/2016  .  Micro Results Recent Results (from the past 240 hour(s))  Blood culture (routine x 2)     Status: None (Preliminary result)   Collection Time: 04/20/16  5:41 AM  Result Value Ref Range Status   Specimen Description BLOOD R ARM  Final   Special Requests BOTTLES DRAWN AEROBIC AND ANAEROBIC BCAV  Final   Culture NO GROWTH 3 DAYS  Final   Report Status PENDING  Incomplete  Blood culture (routine x 2)     Status: None (Preliminary result)   Collection Time: 04/20/16  5:42 AM  Result Value Ref Range Status  Specimen Description BLOOD R AC  Final   Special Requests BOTTLES DRAWN AEROBIC AND ANAEROBIC [place]  Final   Culture NO GROWTH 3 DAYS  Final   Report Status PENDING  Incomplete        Code Status Orders        Start     Ordered   04/20/16 1157  Full code  Continuous     04/20/16 1157    Code Status History    Date Active Date Inactive Code Status Order ID Comments User Context   This patient has a current code status but no historical code status.    Advance Directive Documentation   Flowsheet Row Most Recent Value  Type of Advance Directive  Healthcare Power of Attorney  Pre-existing out of  facility DNR order (yellow form or pink MOST form)  No data  "MOST" Form in Place?  No data          Follow-up Information    [facility]. Schedule an appointment as soon as possible for a visit in 7 days.   Contact information: [place] [address] [phone]        Flora Lipps, MD. Schedule an appointment as soon as possible for a visit in 3 weeks.   Specialties:  Pulmonary Disease, Cardiology Why:  copd Contact information: [address] [place] [address] [phone]           Discharge Medications   Allergies as of 04/23/2016      Reactions   Demerol [meperidine] Other (See Comments)   Pt states she "turned green"       Medication List    STOP taking these medications   azithromycin 250 MG tablet Commonly known as:  ZITHROMAX Z-PAK     TAKE these medications   atorvastatin 40 MG tablet Commonly known as:  LIPITOR Take 40 mg by mouth at bedtime.   benzonatate 100 MG capsule Commonly known as:  TESSALON PERLES 1-2 every 8 hours prn cough   buPROPion 100 MG 12 hr tablet Commonly known as:  WELLBUTRIN SR take 1 tablet by mouth every morning   guaiFENesin 600 MG 12 hr tablet Commonly known as:  MUCINEX Take 1 tablet (600 mg total) by mouth 2 (two) times daily.   guaiFENesin-dextromethorphan 100-10 MG/5ML syrup Commonly known as:  ROBITUSSIN DM Take 15 mLs by mouth every 4 (four) hours as needed for cough.   Ipratropium-Albuterol 20-100 MCG/ACT Aers respimat Commonly known as:  COMBIVENT Inhale 1 puff into the lungs every 6 (six) hours as needed for wheezing.   JANUVIA 25 MG tablet Generic drug:  sitaGLIPtin Take 25 mg by mouth daily.   losartan 100 MG tablet Commonly known as:  COZAAR Take 100 mg by mouth daily.   oseltamivir 30 MG capsule Commonly known as:  TAMIFLU Take 1 capsule (30 mg total) by mouth daily. Start taking on:  04/24/2016   predniSONE 10 MG (21) Tbpk tablet Commonly known as:   STERAPRED UNI-PAK 21 TAB Start at 60mg  taper by 10mg  until complete          Total Time in preparing paper work, data evaluation and todays exam - 35 minutes  Dustin Flock M.D on 04/23/2016 at 12:21 PM  [place]  [phone]

## 2016-04-23 NOTE — Telephone Encounter (Signed)
Please advise where to schedule this hosp f/u. She needs a 3 week.

## 2016-04-23 NOTE — Progress Notes (Signed)
Pt is d/ced back home with daughter.  Pt ambulated and maintained O2 sats at 89-90% with Nursing student.  O2 sats had dropped earlier with PT around 85%.  She is current smoker and educated that she needed to quit and illness will just progress if she doesn't.  She stated she's not interested in quitting.  She's a standby assist ambulating.  IVs removed by nursing student and she's transporting with her daughter.

## 2016-04-23 NOTE — Plan of Care (Signed)
Problem: Education: Goal: Knowledge of  General Education information/materials will improve Outcome: Progressing VSS, free of falls during shift.  No complaints overnight, denies pain.  Call bell within reach, [place].

## 2016-04-25 LAB — CULTURE, BLOOD (ROUTINE X 2)
CULTURE: NO GROWTH
CULTURE: NO GROWTH

## 2016-05-03 ENCOUNTER — Ambulatory Visit: Payer: Medicare Other | Admitting: Psychiatry

## 2016-05-11 ENCOUNTER — Inpatient Hospital Stay: Payer: Medicare Other | Admitting: Pulmonary Disease

## 2016-06-17 ENCOUNTER — Encounter: Payer: Self-pay | Admitting: Internal Medicine

## 2016-06-17 ENCOUNTER — Inpatient Hospital Stay: Payer: Medicare Other | Admitting: Internal Medicine

## 2016-06-30 IMAGING — US US EXTREM LOW VENOUS BILAT
1 series · 13 of 24 positions shown · non-contrast
Comparison: None.

CLINICAL DATA: Lower extremity swelling.



[Series 1: us extrem low venous bilat · 0.08mm/px · 13 of 64 slices shown]
[im 1/64]
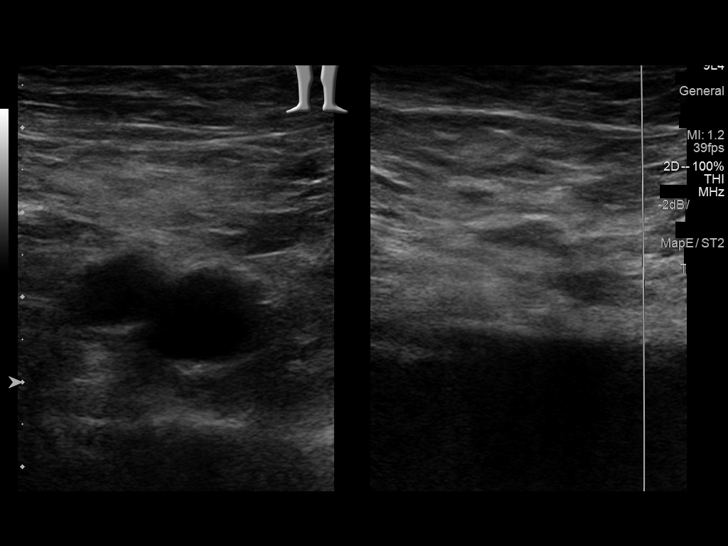
[im 6/64]
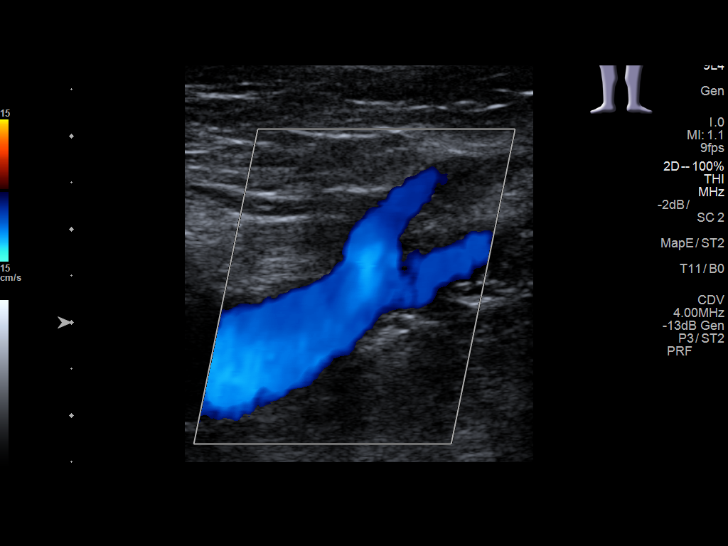
[im 11/64]
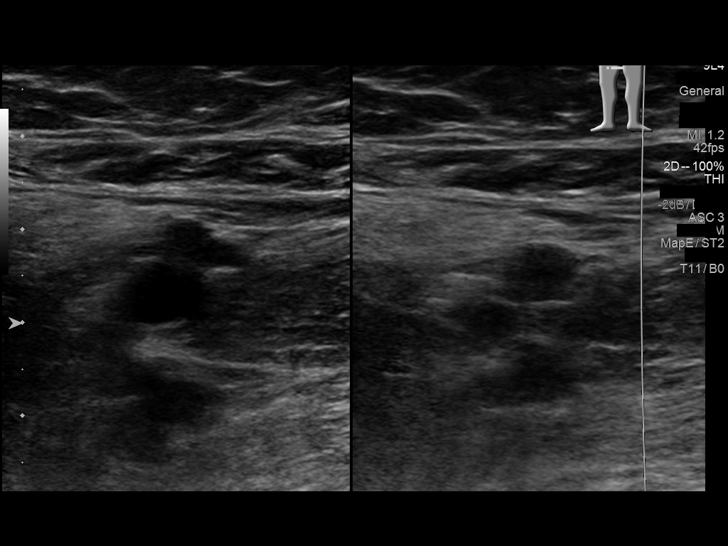
[im 17/64]
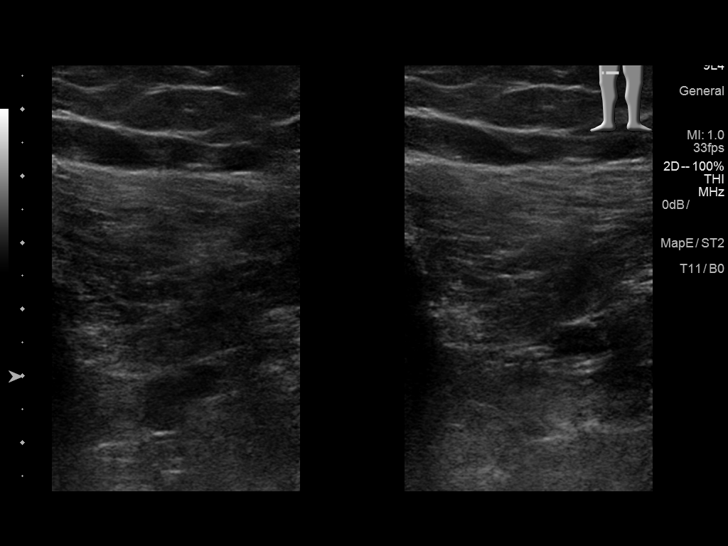
[im 22/64]
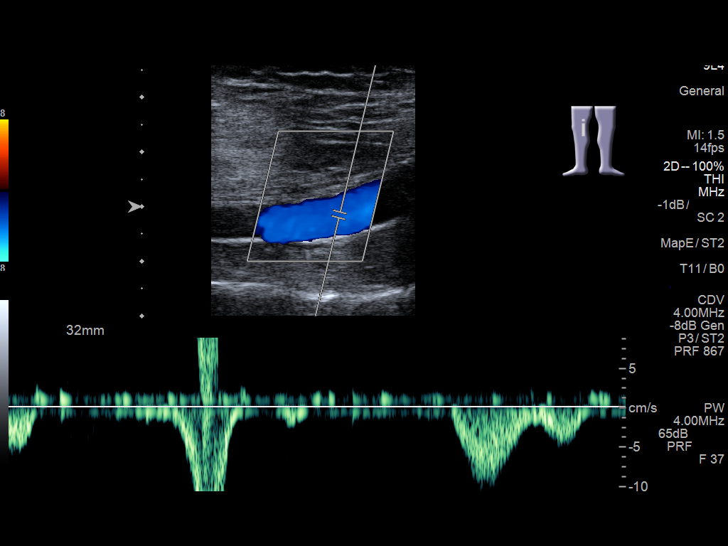
[im 28/64]
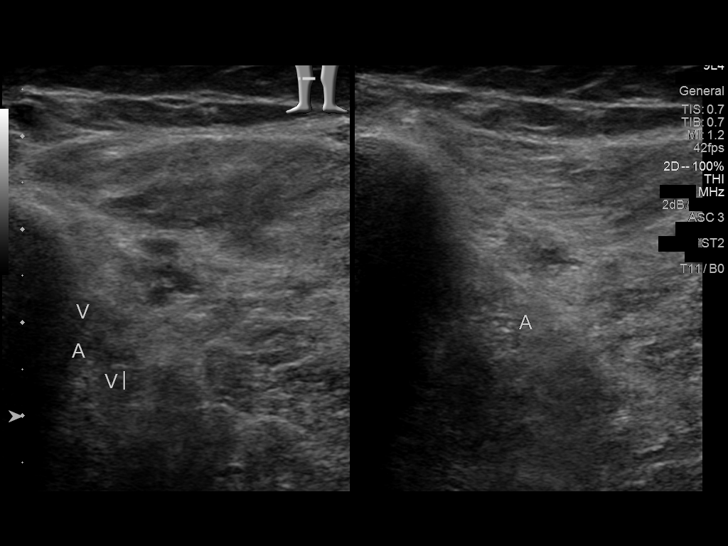
[im 33/64]
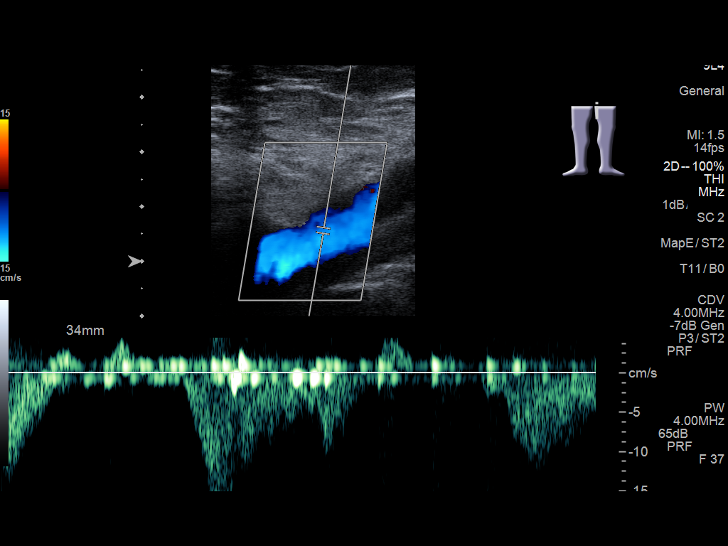
[im 36/64]
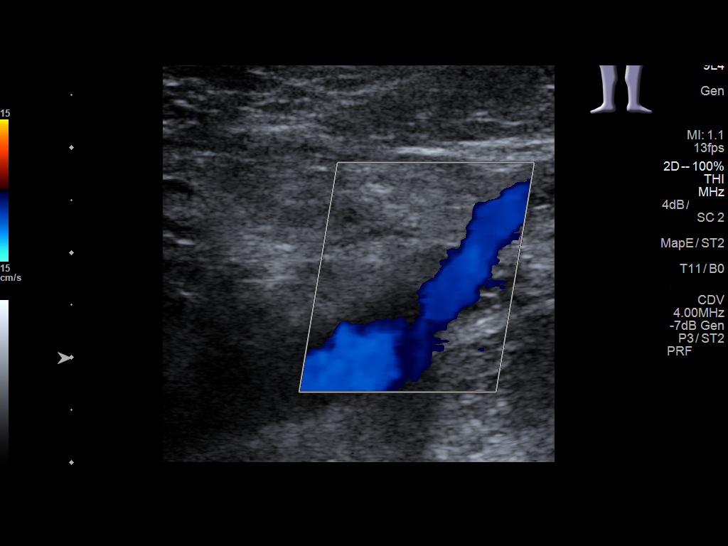
[im 42/64]
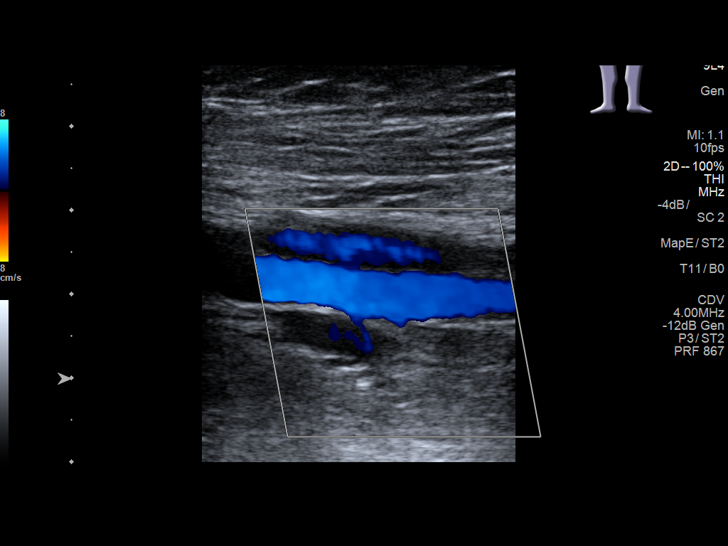
[im 47/64]
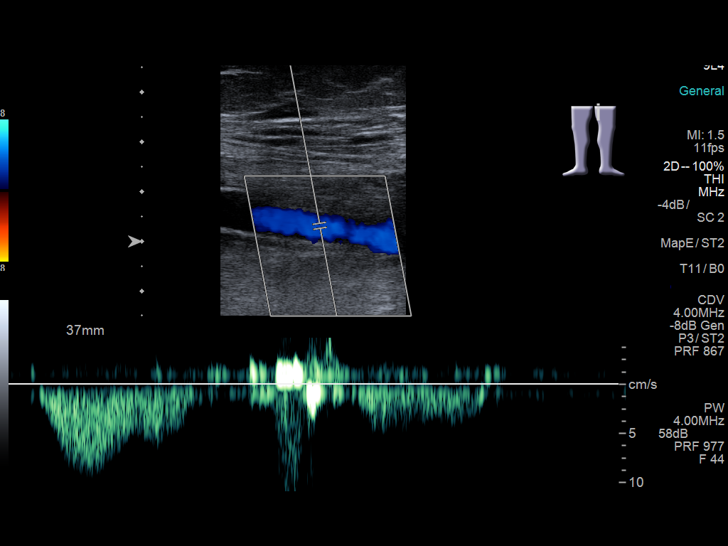
[im 53/64]
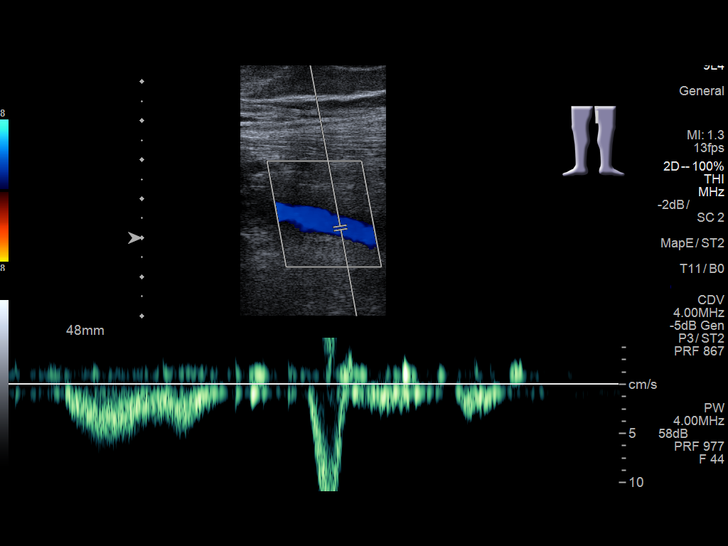
[im 58/64]
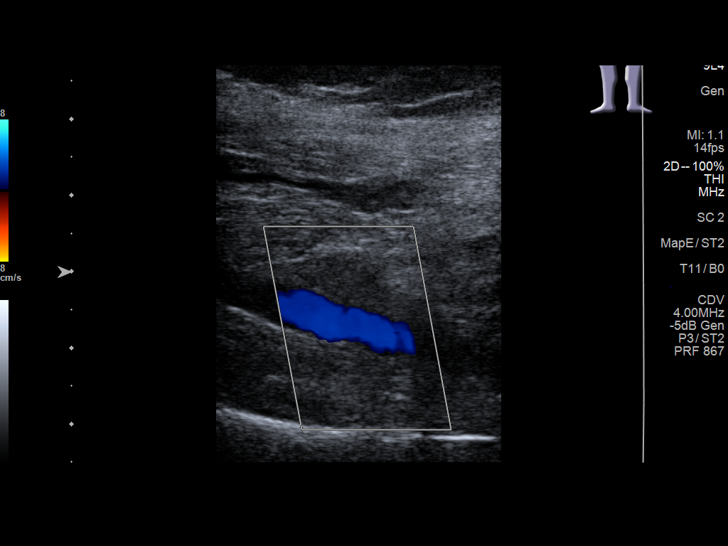
[im 64/64]
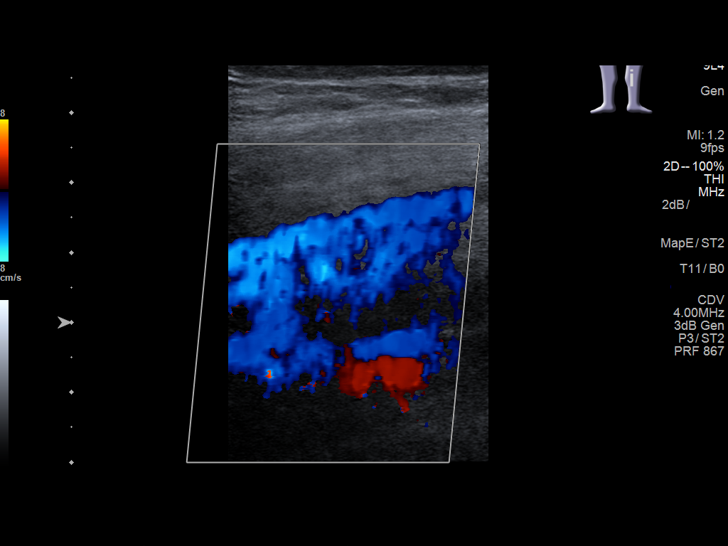

[13 of 24 positions shown; findings below may reference images not displayed]

FINDINGS: RIGHT LOWER EXTREMITY

Common Femoral Vein: No evidence of thrombus. Normal
compressibility, respiratory phasicity and response to augmentation.

Saphenofemoral Junction: No evidence of thrombus. Normal
compressibility and flow on color Doppler imaging.

Profunda Femoral Vein: No evidence of thrombus. Normal
compressibility and flow on color Doppler imaging.

Femoral Vein: No evidence of thrombus. Normal compressibility,
respiratory phasicity and response to augmentation.

Popliteal Vein: No evidence of thrombus. Normal compressibility,
respiratory phasicity and response to augmentation.

Calf Veins: No evidence of thrombus. Normal compressibility and flow
on color Doppler imaging.

Superficial Great Saphenous Vein: No evidence of thrombus. Normal
compressibility and flow on color Doppler imaging.

Venous Reflux:  None.

Other Findings:  None.

LEFT LOWER EXTREMITY

Common Femoral Vein: No evidence of thrombus. Normal
compressibility, respiratory phasicity and response to augmentation.

Saphenofemoral Junction: No evidence of thrombus. Normal
compressibility and flow on color Doppler imaging.

Profunda Femoral Vein: No evidence of thrombus. Normal
compressibility and flow on color Doppler imaging.

Femoral Vein: No evidence of thrombus. Normal compressibility,
respiratory phasicity and response to augmentation.

Popliteal Vein: No evidence of thrombus. Normal compressibility,
respiratory phasicity and response to augmentation.

Calf Veins: Posterior tibial veins patent. Peroneal vein not well
visualized.

Superficial Great Saphenous Vein: No evidence of thrombus. Normal
compressibility and flow on color Doppler imaging.

Venous Reflux:  None.

Other Findings:  None.
IMPRESSION: No evidence of deep venous thrombosis.

## 2016-07-30 ENCOUNTER — Other Ambulatory Visit: Payer: Self-pay | Admitting: Psychiatry

## 2016-08-28 ENCOUNTER — Other Ambulatory Visit: Payer: Self-pay | Admitting: Psychiatry

## 2016-11-01 ENCOUNTER — Encounter: Payer: Self-pay | Admitting: Psychiatry

## 2016-11-01 ENCOUNTER — Ambulatory Visit (INDEPENDENT_AMBULATORY_CARE_PROVIDER_SITE_OTHER): Payer: Medicare Other | Admitting: Psychiatry

## 2016-11-01 VITALS — BP 122/58 | Temp 97.8°F | Wt 143.6 lb

## 2016-11-01 DIAGNOSIS — F411 Generalized anxiety disorder: Secondary | ICD-10-CM

## 2016-11-01 MED ORDER — BUSPIRONE HCL 10 MG PO TABS: 10.0000 mg | ORAL_TABLET | Freq: Two times a day (BID) | ORAL | 1 refills | Status: DC
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
## 2016-11-02 ENCOUNTER — Ambulatory Visit: Payer: Medicare Other | Admitting: Psychiatry

## 2016-11-29 ENCOUNTER — Ambulatory Visit: Payer: Medicare Other | Admitting: Psychiatry

## 2016-12-24 ENCOUNTER — Other Ambulatory Visit: Payer: Self-pay | Admitting: Psychiatry

## 2017-01-02 ENCOUNTER — Other Ambulatory Visit: Payer: Self-pay | Admitting: Psychiatry

## 2017-01-10 ENCOUNTER — Encounter: Payer: Self-pay | Admitting: Psychiatry

## 2017-01-10 ENCOUNTER — Ambulatory Visit: Payer: Medicare Other | Admitting: Psychiatry

## 2017-01-10 DIAGNOSIS — R419 Unspecified symptoms and signs involving cognitive functions and awareness: Secondary | ICD-10-CM

## 2017-01-10 DIAGNOSIS — F3130 Bipolar disorder, current episode depressed, mild or moderate severity, unspecified: Secondary | ICD-10-CM | POA: Diagnosis not present

## 2017-01-10 MED ORDER — MEMANTINE HCL 5 MG PO TABS: 5.0000 mg | ORAL_TABLET | Freq: Every day | ORAL | 1 refills | Status: DC

## 2017-01-10 MED ORDER — BUSPIRONE HCL 10 MG PO TABS: 10.0000 mg | ORAL_TABLET | Freq: Two times a day (BID) | ORAL | 1 refills | Status: DC

## 2017-01-10 NOTE — Progress Notes (Signed)
Psychiatric MD Progress Note  Patient Identification: Miranda Ryan MRN:  [id number] Date of Evaluation:  01/10/2017 Referral Source: Dr Jimmye Norman Chief Complaint:    Visit Diagnosis:    ICD-10-CM   1. Bipolar I disorder, most recent episode depressed ([place]) F31.30   2. Neurocognitive disorder R41.9     History of Present Illness:    Patient is a 71 year old female who presented for follow up accompanied by her daughter. Her daughter reported that patient continues to sit at home and she has been not involved in any activities. Patient appeared apprehensive. When I asked her about the date and time and she was not able to answer any questions. She reported that Christmas is coming and she was not aware  of the month as well. Her daughter reported that she is not involved in any activities at home. She she has been placing the frozen pizza in the toaster and daughter is trying to avoid her from getting involved in any activities. Patient enjoys watching TV. Her daughter reported that she has concerns about her memory. We discussed about starting her on medications and daughter agreed with the plan. She takes BuSpar as prescribed.   Patient appears to have memory deficits at this time as she has recent and remote memory and cognitive decline.         Associated Signs/Symptoms: Depression Symptoms:  psychomotor retardation, weight loss, (Hypo) Manic Symptoms:  denied Anxiety Symptoms:  denied Psychotic Symptoms:  denied PTSD Symptoms: Negative NA  Past Psychiatric History:  Admitted to [facility] in past. She was diagnosed with bipolar disorder many years ago. She has been compliant with her medications.   Previous Psychotropic Medications: Risperdal  Substance Abuse History in the last 12 months:  No.  Consequences of Substance Abuse: Negative NA  Past Medical History:  Past Medical History:  Diagnosis Date  . Anxiety   . Bipolar disorder ([place])   . Depression   .  Diabetes mellitus, type II [facility])     Past Surgical History:  Procedure Laterality Date  . CERVIX SURGERY      Family Psychiatric History:  H/o  anxiety in her family  Family History:  Family History  Problem Relation Age of Onset  . Liver cancer Mother   . Heart attack Father   . Heart disease Sister   . Heart disease Brother   . Heart disease Brother   . Heart disease Brother     Social History:   Social History   Socioeconomic History  . Marital status: Widowed    Spouse name: Not on file  . Number of children: Not on file  . Years of education: Not on file  . Highest education level: Not on file  Social Needs  . Financial resource strain: Not on file  . Food insecurity - worry: Not on file  . Food insecurity - inability: Not on file  . Transportation needs - medical: Not on file  . Transportation needs - non-medical: Not on file  Occupational History  . Not on file  Tobacco Use  . Smoking status: Current Every Day Smoker    Packs/day: 1.00    Years: 46.00    Pack years: 46.00    Types: Cigarettes    Start date: 09/16/1964  . Smokeless tobacco: Never Used  Substance and Sexual Activity  . Alcohol use: No    Alcohol/week: 0.0 oz  . Drug use: No  . Sexual activity: Not Currently  Other Topics Concern  .  Not on file  Social History Narrative  . Not on file    Additional Social History:  Lives with daughter. Widowed.   Allergies:   Allergies  Allergen Reactions  . Demerol [Meperidine] Other (See Comments)    Pt states she "turned green"     Metabolic Disorder Labs: No results found for: HGBA1C, MPG No results found for: PROLACTIN No results found for: CHOL, TRIG, HDL, CHOLHDL, VLDL, LDLCALC   Current Medications: Current Outpatient Medications  Medication Sig Dispense Refill  . atorvastatin (LIPITOR) 40 MG tablet Take 40 mg by mouth at bedtime.    . busPIRone (BUSPAR) 10 MG tablet Take 1 tablet (10 mg total) 2 (two) times daily by mouth. 60  tablet 1  . JANUVIA 25 MG tablet Take 25 mg by mouth daily.   0  . losartan (COZAAR) 100 MG tablet Take 100 mg by mouth daily.    . memantine (NAMENDA) 5 MG tablet Take 1 tablet (5 mg total) daily by mouth. 30 tablet 1   No current facility-administered medications for this visit.     Neurologic: Headache: No Seizure: No Paresthesias:No  Musculoskeletal: Strength & Muscle Tone: within normal limits Gait & Station: normal Patient leans: N/A  Psychiatric Specialty Exam: ROS  There were no vitals taken for this visit.There is no height or weight on file to calculate BMI.  General Appearance: Casual  Eye Contact:  Fair  Speech:  Slow  Volume:  Decreased  Mood:  Depressed  Affect:  Appropriate  Thought Process:  Coherent and Goal Directed  Orientation:  Full (Time, Place, and Person)  Thought Content:  WDL  Suicidal Thoughts:  No  Homicidal Thoughts:  No  Memory:  Immediate;   Fair Recent;   Fair Remote;   Fair  Judgement:  Fair  Insight:  Fair  Psychomotor Activity:  Normal  Concentration:  Concentration: Fair and Attention Span: Fair  Recall:  [employer] of Knowledge:Fair  Language: Fair  Akathisia:  No  Handed:  Right  AIMS (if indicated):    Assets:  Communication Skills Desire for Improvement Physical Health Social Support  ADL's:  Intact  Cognition: WNL  Sleep:  poor    Treatment Plan Summary: Medication management   Discussed with patient and her daughter about the medications treatment risks benefits and alternatives.  BuSpar 10 mg by mouth twice a day for anxiety.  I will start her on Namenda 5 mg by mouth daily. Discussed with daughter about the same and she agreed with the plan.  Discussed with her about the side effects of the medications and he agreed with the plan.   She will continue to follow-up in one month or earlier depending on the symptoms.      More than 50% of the time spent in psychoeducation, counseling and coordination of care.     This note was generated in part or whole with voice recognition software. Voice regonition is usually quite accurate but there are transcription errors that can and very often do occur. I apologize for any typographical errors that were not detected and corrected.    Rainey Pines, MD 11/19/201811:39 AM

## 2017-02-14 ENCOUNTER — Ambulatory Visit: Payer: Medicare Other | Admitting: Psychiatry

## 2017-03-08 ENCOUNTER — Emergency Department: Payer: Medicare Other

## 2017-03-08 ENCOUNTER — Emergency Department
Admission: EM | Admit: 2017-03-08 | Discharge: 2017-03-08 | Disposition: A | Discharge status: Home / Self Care | Payer: Medicare Other | Attending: Emergency Medicine | Admitting: Emergency Medicine

## 2017-03-08 ENCOUNTER — Encounter: Payer: Self-pay | Admitting: Emergency Medicine

## 2017-03-08 ENCOUNTER — Other Ambulatory Visit: Payer: Self-pay

## 2017-03-08 DIAGNOSIS — R05 Cough: Secondary | ICD-10-CM | POA: Diagnosis present

## 2017-03-08 DIAGNOSIS — B349 Viral infection, unspecified: Secondary | ICD-10-CM | POA: Diagnosis not present

## 2017-03-08 DIAGNOSIS — Z79899 Other long term (current) drug therapy: Secondary | ICD-10-CM | POA: Insufficient documentation

## 2017-03-08 DIAGNOSIS — Z7984 Long term (current) use of oral hypoglycemic drugs: Secondary | ICD-10-CM | POA: Insufficient documentation

## 2017-03-08 DIAGNOSIS — F17210 Nicotine dependence, cigarettes, uncomplicated: Secondary | ICD-10-CM | POA: Insufficient documentation

## 2017-03-08 DIAGNOSIS — J449 Chronic obstructive pulmonary disease, unspecified: Secondary | ICD-10-CM | POA: Diagnosis not present

## 2017-03-08 DIAGNOSIS — E119 Type 2 diabetes mellitus without complications: Secondary | ICD-10-CM | POA: Diagnosis not present

## 2017-03-08 DIAGNOSIS — J209 Acute bronchitis, unspecified: Secondary | ICD-10-CM

## 2017-03-08 LAB — URINALYSIS, COMPLETE (UACMP) WITH MICROSCOPIC
BILIRUBIN URINE: NEGATIVE
GLUCOSE, UA: NEGATIVE mg/dL
KETONES UR: NEGATIVE mg/dL
Leukocytes, UA: NEGATIVE
NITRITE: NEGATIVE
PROTEIN: NEGATIVE mg/dL
Specific Gravity, Urine: 1.013 (ref 1.005–1.030)
pH: 5 (ref 5.0–8.0)

## 2017-03-08 LAB — CBC
HEMATOCRIT: 36.8 % (ref 35.0–47.0)
HEMOGLOBIN: 12 g/dL (ref 12.0–16.0)
MCH: 27.6 pg (ref 26.0–34.0)
MCHC: 32.7 g/dL (ref 32.0–36.0)
MCV: 84.4 fL (ref 80.0–100.0)
Platelets: 263 10*3/uL (ref 150–440)
RBC: 4.37 MIL/uL (ref 3.80–5.20)
RDW: 14.9 % — ABNORMAL HIGH (ref 11.5–14.5)
WBC: 8.7 10*3/uL (ref 3.6–11.0)

## 2017-03-08 LAB — BASIC METABOLIC PANEL
ANION GAP: 10 (ref 5–15)
BUN: 46 mg/dL — ABNORMAL HIGH (ref 6–20)
CALCIUM: 8.6 mg/dL — AB (ref 8.9–10.3)
CO2: 21 mmol/L — AB (ref 22–32)
Chloride: 107 mmol/L (ref 101–111)
Creatinine, Ser: 1.83 mg/dL — ABNORMAL HIGH (ref 0.44–1.00)
GFR, EST AFRICAN AMERICAN: 31 mL/min — AB (ref >60)
GFR, EST NON AFRICAN AMERICAN: 26 mL/min — AB (ref >60)
Glucose, Bld: 188 mg/dL — ABNORMAL HIGH (ref 65–99)
POTASSIUM: 4.7 mmol/L (ref 3.5–5.1)
Sodium: 138 mmol/L (ref 135–145)

## 2017-03-08 LAB — INFLUENZA PANEL BY PCR (TYPE A & B)
Influenza A By PCR: NEGATIVE
Influenza B By PCR: NEGATIVE

## 2017-03-08 LAB — LACTIC ACID, PLASMA: Lactic Acid, Venous: 1.3 mmol/L (ref 0.5–1.9)

## 2017-03-08 IMAGING — CR DG CHEST 2V
2 series · 2 of 2 positions shown · non-contrast
Comparison: [DATE]

CLINICAL DATA: Flu like symptoms

EXAM:
CHEST  2 VIEW

[chest pa]
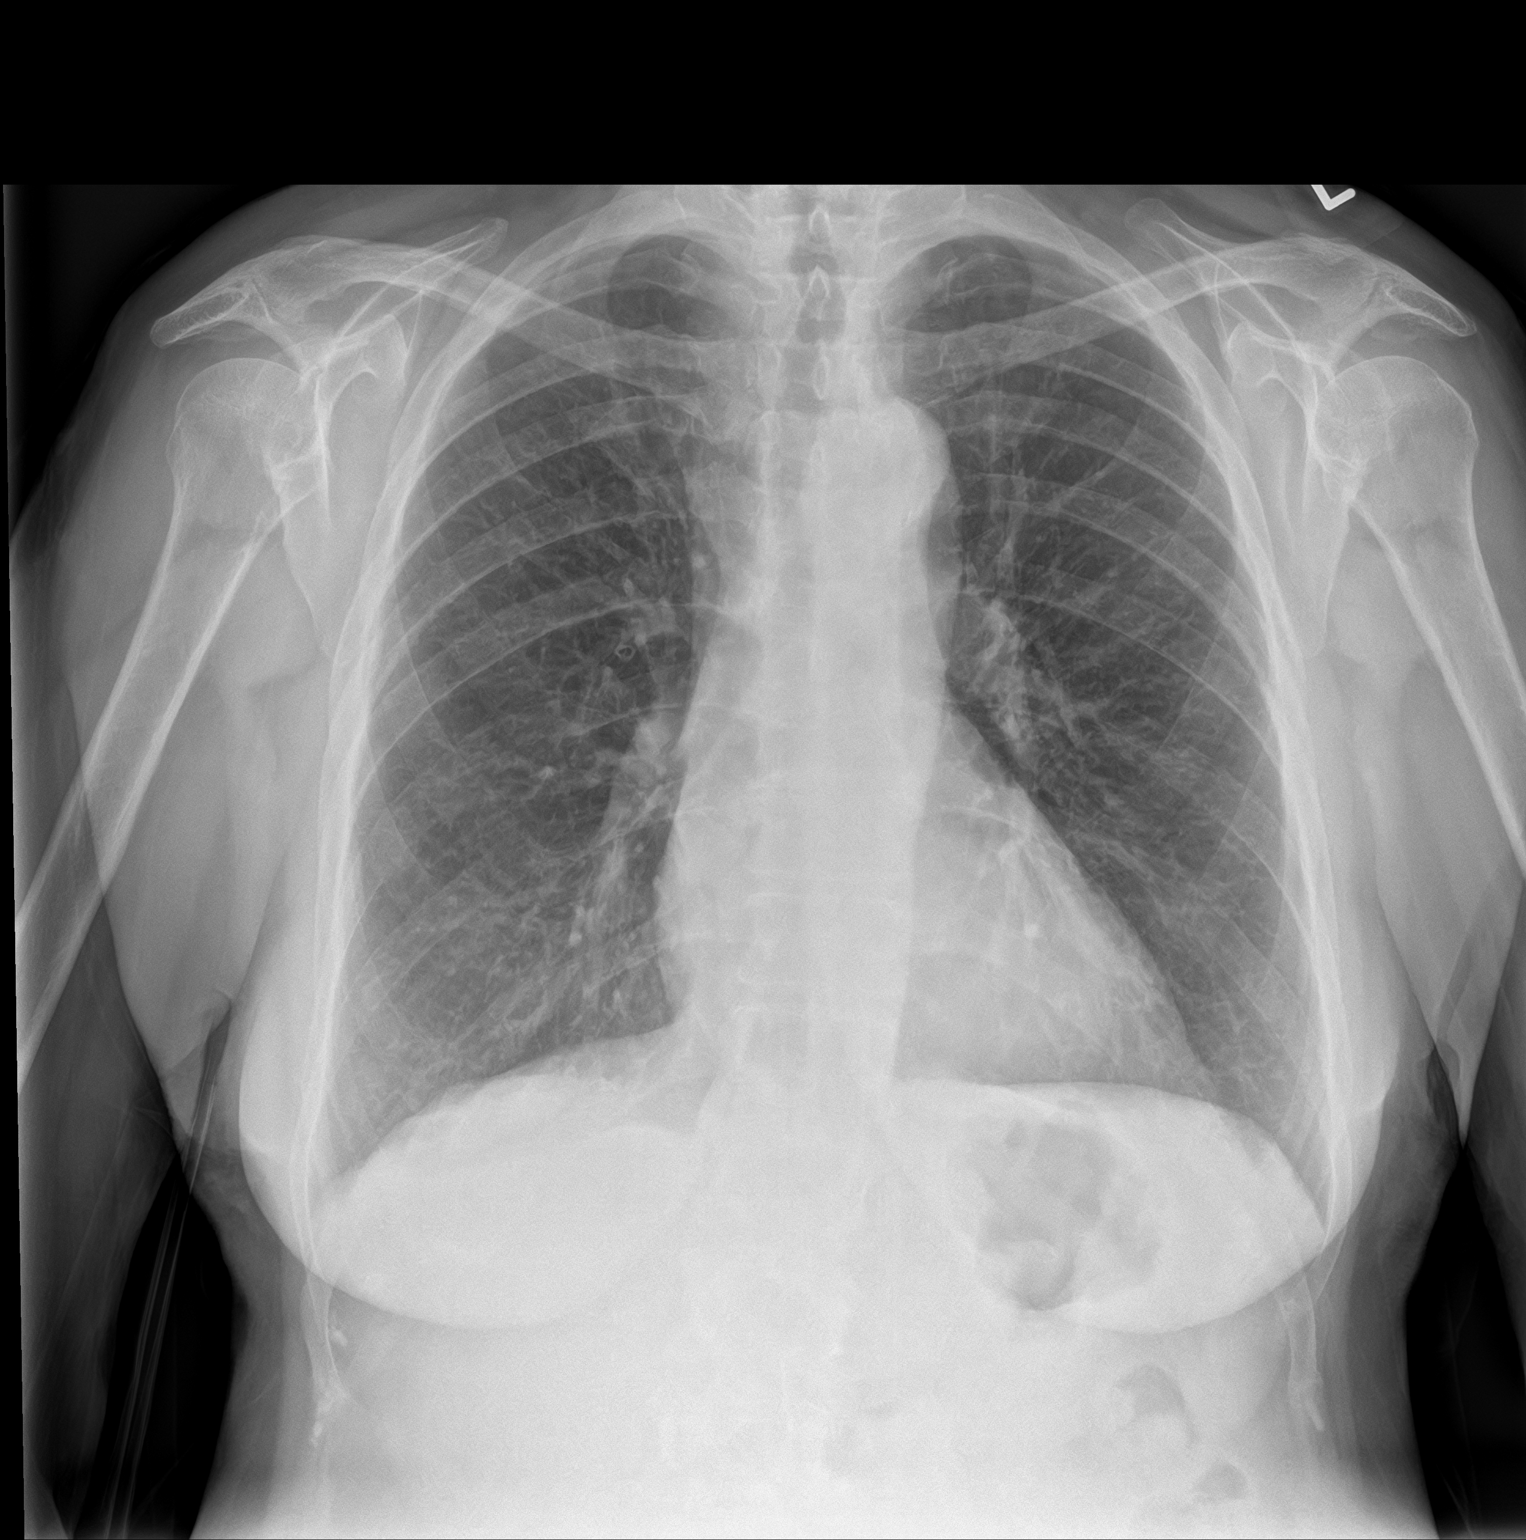

[chest lat]
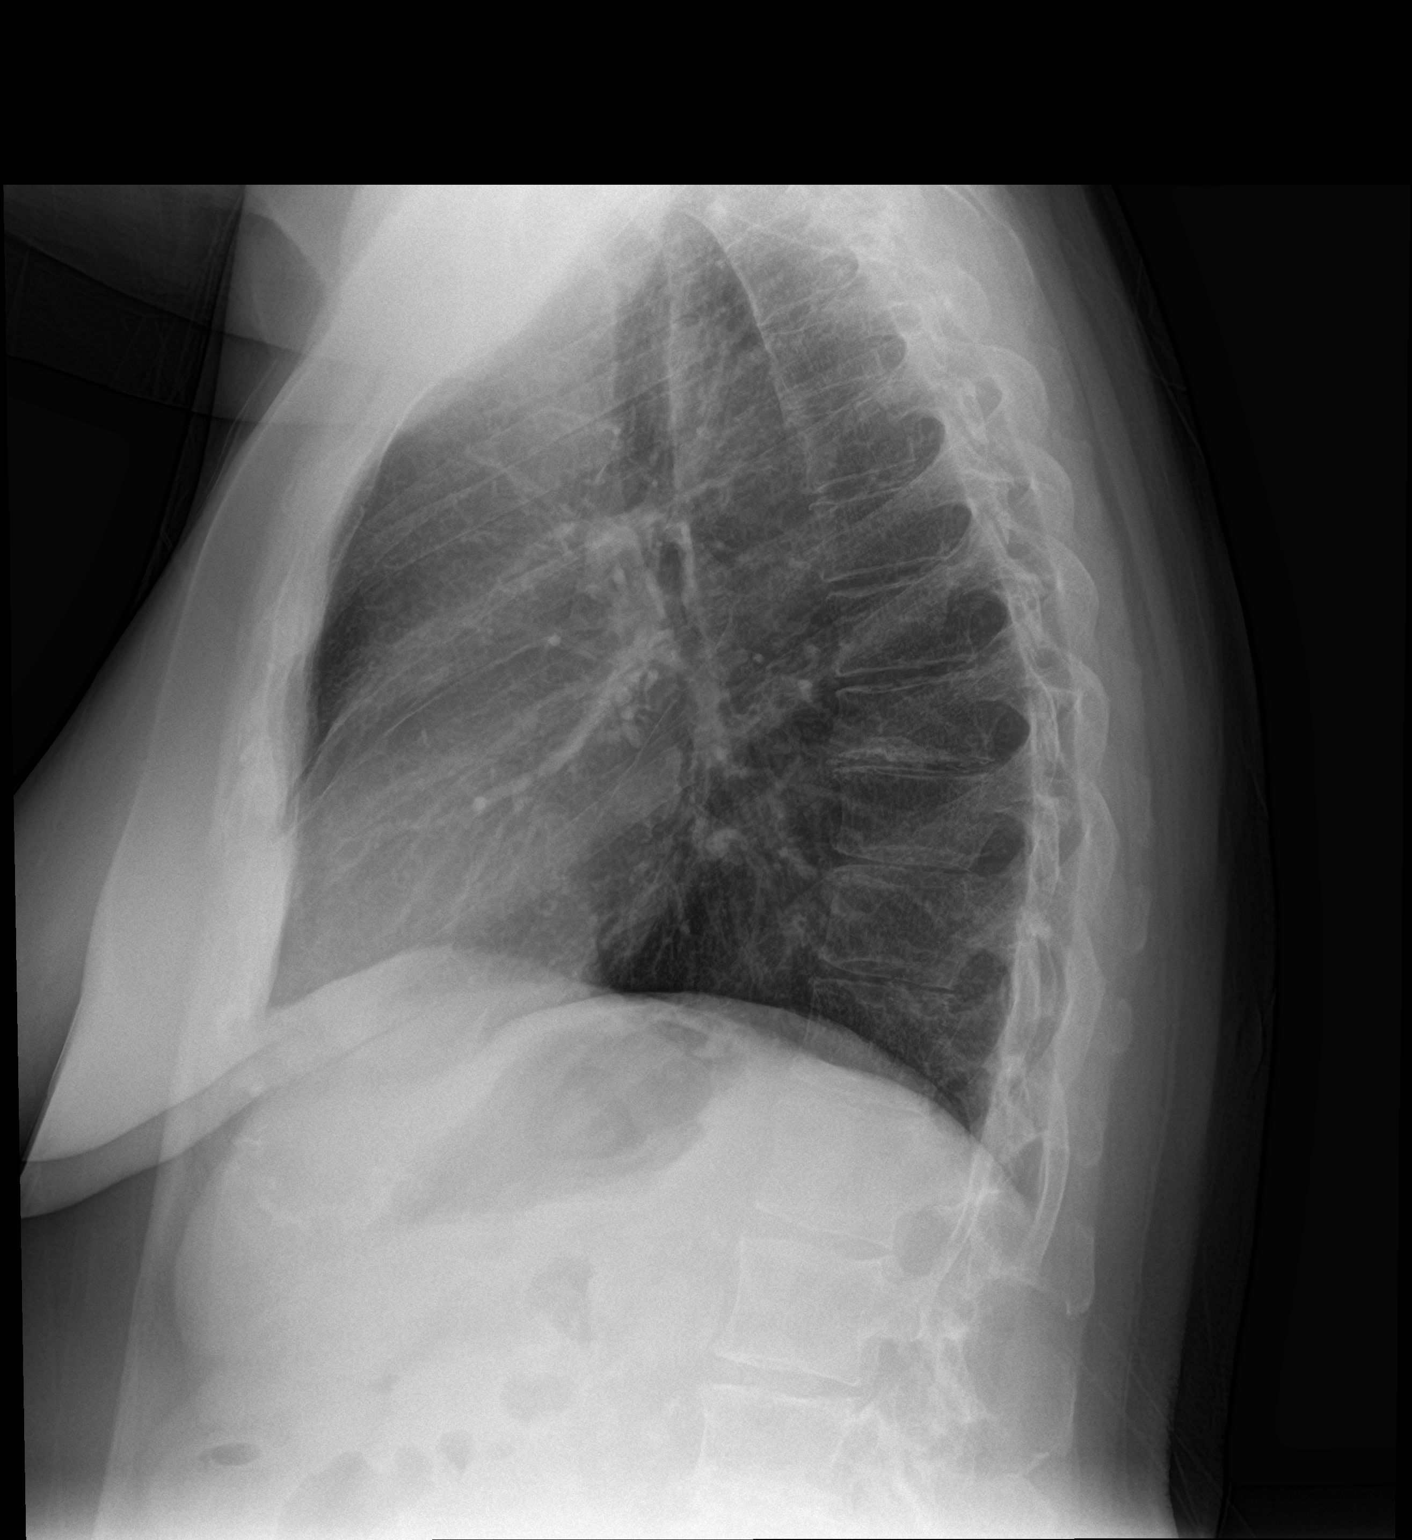

[2 of 2 positions shown; findings below may reference images not displayed]

FINDINGS: The heart size and mediastinal contours are within normal limits.
Both lungs are clear. The visualized skeletal structures are
unremarkable.
IMPRESSION: No active cardiopulmonary disease.

## 2017-03-08 MED ORDER — SODIUM CHLORIDE 0.9 % IV BOLUS (SEPSIS)
1000.0000 mL | Freq: Once | INTRAVENOUS | Status: AC
  Administered 2017-03-08: 1000 mL via INTRAVENOUS

## 2017-03-08 MED ORDER — AMOXICILLIN-POT CLAVULANATE 875-125 MG PO TABS: 1.0000 | ORAL_TABLET | Freq: Two times a day (BID) | ORAL | 0 refills | Status: DC

## 2017-03-08 NOTE — ED Provider Notes (Signed)
[facility] Emergency Department Provider Note   ____________________________________________   First MD Initiated Contact with Patient 03/08/17 1807     (approximate)  I have reviewed the triage vital signs and the nursing notes.   HISTORY  Chief Complaint Influenza    HPI Miranda Ryan is a 72 y.o. female presents for evaluation for ongoing cough and runny nose for about 2-3 weeks.  Patient as well as her daughter report that multiple people in the home been sick with "flulike" symptoms.  Patient did have a flu about a year ago, this seems much better, but she as well as several family members have come today because her illness does not seem to be improving.  She had a dry nonproductive cough, runny nose and had a slightly sore throat as well as some aches and chills about 2 weeks ago which seem to be improving.  She has felt slightly lightheaded at times, has not been eating or drinking well for the last week.  Denies any nausea vomiting or abdominal pain.  Not taking any antibiotics for this.  Denies any wheezing or trouble breathing.  Denies shortness of breath or chest pain.  Overall feels like her illness is just "not getting better"  Past Medical History:  Diagnosis Date  . Anxiety   . Bipolar disorder ([place])   . Depression   . Diabetes mellitus, type II [facility])     Patient Active Problem List   Diagnosis Date Noted  . Acute exacerbation of chronic obstructive pulmonary disease (COPD) ([place]) 04/20/2016    Past Surgical History:  Procedure Laterality Date  . CERVIX SURGERY      Prior to Admission medications   Medication Sig Start Date End Date Taking? Authorizing Provider  amoxicillin-clavulanate (AUGMENTIN) 875-125 MG tablet Take 1 tablet by mouth 2 (two) times daily. 03/08/17   Delman Kitten, MD  atorvastatin (LIPITOR) 40 MG tablet Take 40 mg by mouth at bedtime.    [provider]  busPIRone (BUSPAR) 10 MG tablet Take 1 tablet  (10 mg total) 2 (two) times daily by mouth. 01/10/17   Rainey Pines, MD  JANUVIA 25 MG tablet Take 25 mg by mouth daily.     [provider]  losartan (COZAAR) 100 MG tablet Take 100 mg by mouth daily.    [provider]  memantine (NAMENDA) 5 MG tablet Take 1 tablet (5 mg total) daily by mouth. 01/10/17   Rainey Pines, MD    Allergies Demerol [meperidine]  Family History  Problem Relation Age of Onset  . Liver cancer Mother   . Heart attack Father   . Heart disease Sister   . Heart disease Brother   . Heart disease Brother   . Heart disease Brother     Social History Social History   Tobacco Use  . Smoking status: Current Every Day Smoker    Packs/day: 1.00    Years: 46.00    Pack years: 46.00    Types: Cigarettes    Start date: 09/16/1964  . Smokeless tobacco: Never Used  Substance Use Topics  . Alcohol use: No    Alcohol/week: 0.0 oz  . Drug use: No    Review of Systems Constitutional: Fevers and chills about 1 week ago, seem to be better now.  Eyes: No visual changes. ENT: No sore throat.  Did a sore throat about a week ago.  Slightly runny nose over the last 2 weeks. Cardiovascular: Denies chest pain. Respiratory: Denies shortness  of breath.  Dry nonproductive cough. Gastrointestinal: No abdominal pain.  No nausea, no vomiting.  No diarrhea.  No constipation. Genitourinary: Negative for dysuria. Musculoskeletal: Negative for back pain.  Slight body aches that are improving. Skin: Negative for rash. Neurological: Negative for headaches, focal weakness or numbness.    ____________________________________________   PHYSICAL EXAM:  VITAL SIGNS: ED Triage Vitals  Enc Vitals Group     BP 03/08/17 1733 (!) 86/67     Pulse Rate 03/08/17 1733 97     Resp 03/08/17 1732 16     Temp 03/08/17 1732 97.8 F (36.6 C)     Temp Source 03/08/17 1732 Oral     SpO2 03/08/17 1733 98 %     Weight --      Height --      Head Circumference --      Peak  Flow --      Pain Score 03/08/17 1732 0     Pain Loc --      Pain Edu? --      Excl. in [place]? --     Constitutional: Alert and oriented. Well appearing and in no acute distress.  Not hypotensive on my initial evaluation. Eyes: Conjunctivae are normal. Head: Atraumatic. Nose: No congestion/rhinnorhea. Mouth/Throat: Mucous membranes are moderately dry. Neck: No stridor.   Cardiovascular: Normal rate, regular rhythm. Grossly normal heart sounds.  Good peripheral circulation. Respiratory: Normal respiratory effort.  No retractions. Lungs CTAB.  Speaks in full clear sentences.  No wheezing. Gastrointestinal: Soft and nontender. No distention. Musculoskeletal: No lower extremity tenderness nor edema. Neurologic:  Normal speech and language. No gross focal neurologic deficits are appreciated.  Skin:  Skin is warm, dry and intact some increased turgor is noted. No rash noted. Psychiatric: Mood and affect are normal. Speech and behavior are normal.  ____________________________________________   LABS (all labs ordered are listed, but only abnormal results are displayed)  Labs Reviewed  BASIC METABOLIC PANEL - Abnormal; Notable for the following components:      Result Value   CO2 21 (*)    Glucose, Bld 188 (*)    BUN 46 (*)    Creatinine, Ser 1.83 (*)    Calcium 8.6 (*)    GFR calc non Af Amer 26 (*)    GFR calc Af Amer 31 (*)    All other components within normal limits  CBC - Abnormal; Notable for the following components:   RDW 14.9 (*)    All other components within normal limits  URINALYSIS, COMPLETE (UACMP) WITH MICROSCOPIC - Abnormal; Notable for the following components:   Color, Urine YELLOW (*)    APPearance HAZY (*)    Hgb urine dipstick SMALL (*)    Bacteria, UA RARE (*)    Squamous Epithelial / LPF 0-5 (*)    All other components within normal limits  LACTIC ACID, PLASMA  INFLUENZA PANEL BY PCR (TYPE A & B)  CBG MONITORING, ED    ____________________________________________  EKG  Reviewed and interpreted by me at 1800 Heart rate 85 QRS 89 QTC 430 Normal sinus rhythm, no evidence of ischemia or ectopy ____________________________________________  RADIOLOGY  Dg Chest 2 View  Result Date: 03/08/2017 CLINICAL DATA:  Flu like symptoms EXAM: CHEST  2 VIEW COMPARISON:  04/20/2016 FINDINGS: The heart size and mediastinal contours are within normal limits. Both lungs are clear. The visualized skeletal structures are unremarkable. IMPRESSION: No active cardiopulmonary disease. Electronically Signed   By: Madie Reno.D.  On: 03/08/2017 18:31    Chest x-ray reviewed no acute ____________________________________________   PROCEDURES  Procedure(s) performed: None  Procedures  Critical Care performed: No  ____________________________________________   INITIAL IMPRESSION / ASSESSMENT AND PLAN / ED COURSE  Pertinent labs & imaging results that were available during my care of the patient were reviewed by me and considered in my medical decision making (see chart for details).  Patient presents for evaluation of upper respiratory symptoms, seems like they are likely worse about 1-2 weeks ago but now to be a seem to be improving but ongoing dry cough and runny nose.  Afebrile, initially slightly hypotensive on arrival but had no further episodes of hypotension and orthostatics were reassuring after fluids.  Suspect likely somewhat dehydrated given her recent illness but no signs or symptoms to suggest sepsis.  Find no evidence of acute bacterial infection, but given her history of COPD for which she does not seem to have any active symptoms such as wheezing or shortness of breath I will treat her with amoxicillin as we discussed risks and benefits of other medications but she reports that amoxicillin has been most effective for her in the past.  Clinical Course as of Mar 09 48  Tue Mar 08, 2017  1932  Patient resting comfortably.  Blood pressure is stable.  Currently checking orthostatics.  She reports no complaints at present.  Labs reviewed very reassuring, no evidence of sepsis.  Suspect likely some mild dehydration or potentially spurious low blood pressure at triage.  She denies any symptoms of hypotension.  Is alert well oriented no distress.  [MQ]    Clinical Course User Index [MQ] Delman Kitten, MD   Return precautions and treatment recommendations and follow-up discussed with the patient who is agreeable with the plan.   ____________________________________________   FINAL CLINICAL IMPRESSION(S) / ED DIAGNOSES  Final diagnoses:  Viral syndrome  Acute bronchitis, unspecified organism      NEW MEDICATIONS STARTED DURING THIS VISIT:  Discharge Medication List as of 03/08/2017  9:44 PM    START taking these medications   Details  amoxicillin-clavulanate (AUGMENTIN) 875-125 MG tablet Take 1 tablet by mouth 2 (two) times daily., Starting Tue 03/08/2017, Print         Note:  This document was prepared using Dragon voice recognition software and may include unintentional dictation errors.     Delman Kitten, MD 03/09/17 [phone]

## 2017-03-08 NOTE — ED Triage Notes (Addendum)
Pt to ED via POV with c/o flu like symptoms xfew wks. Fever at home. Pt A&OX4, hypotensive in triage, 99I systolic. BP taken x3 , BP 86/67

## 2017-03-08 NOTE — ED Notes (Signed)
Patient c/o flu like symptoms X2 weeks. Reports fevers on and off at home. Feels weak in lower extremities

## 2017-03-10 ENCOUNTER — Ambulatory Visit: Payer: Self-pay | Admitting: Psychiatry

## 2017-04-03 ENCOUNTER — Other Ambulatory Visit: Payer: Self-pay | Admitting: Psychiatry

## 2017-04-05 NOTE — Telephone Encounter (Signed)
requesting a refill on  buspirone hcl 10mg  pt was last seen on  03-10-17 next appt was 04-11-17

## 2017-04-06 ENCOUNTER — Telehealth: Payer: Self-pay | Admitting: Psychiatry

## 2017-04-06 DIAGNOSIS — F319 Bipolar disorder, unspecified: Secondary | ICD-10-CM

## 2017-04-06 MED ORDER — BUSPIRONE HCL 10 MG PO TABS: 10.0000 mg | ORAL_TABLET | Freq: Two times a day (BID) | ORAL | 0 refills | Status: DC

## 2017-04-06 NOTE — Telephone Encounter (Signed)
Per records per [facility], patient requesting refill on BuSpar 10 mg.  Patient has an appointment scheduled on Monday, February 18 with Dr. Gretel Acre. Reviewed notes per Dr. Gretel Acre.  Patient had her last appointment in November and was asked to follow-up in a month.  But it looks like patient did not make it as scheduled.   We will send BuSpar 10 mg twice a day, 10 pills to last until she comes for her appointment.

## 2017-04-06 NOTE — Telephone Encounter (Signed)
Message left on voicemail for patient.  

## 2017-04-06 NOTE — Telephone Encounter (Signed)
Received another request today and dr. Shea Evans order 10 pills enough for pt to have until there appt for 04-11-17.

## 2017-04-11 ENCOUNTER — Ambulatory Visit (INDEPENDENT_AMBULATORY_CARE_PROVIDER_SITE_OTHER): Payer: Medicare Other | Admitting: Psychiatry

## 2017-04-11 ENCOUNTER — Encounter: Payer: Self-pay | Admitting: Psychiatry

## 2017-04-11 ENCOUNTER — Other Ambulatory Visit: Payer: Self-pay

## 2017-04-11 VITALS — BP 96/64 | HR 80 | Temp 97.3°F | Wt 139.6 lb

## 2017-04-11 DIAGNOSIS — R419 Unspecified symptoms and signs involving cognitive functions and awareness: Secondary | ICD-10-CM

## 2017-04-11 DIAGNOSIS — F319 Bipolar disorder, unspecified: Secondary | ICD-10-CM

## 2017-04-11 MED ORDER — MEMANTINE HCL 10 MG PO TABS: 10.0000 mg | ORAL_TABLET | Freq: Every day | ORAL | 1 refills | Status: DC

## 2017-04-11 MED ORDER — BUSPIRONE HCL 10 MG PO TABS: 10.0000 mg | ORAL_TABLET | Freq: Two times a day (BID) | ORAL | 1 refills | Status: DC

## 2017-04-11 NOTE — Progress Notes (Signed)
Psychiatric MD Progress Note  Patient Identification: Miranda Ryan MRN:  [id number] Date of Evaluation:  04/11/2017 Referral Source: Dr Jimmye Norman Chief Complaint:   Chief Complaint    Follow-up; Medication Refill     Visit Diagnosis:    ICD-10-CM   1. Bipolar I disorder ([place]) F31.9   2. Neurocognitive disorder R41.9     History of Present Illness:    Patient is a 72 year old female who presented for follow up accompanied by her daughter. Her daughter reported that patient missed her last appointment as all of them are very sick in the month of December. She was able to get the refill of the BuSpar. However she has responded to the Namenda and the daughter did not get the refill of the medication. Her daughter reported that patient was able to converse more with the family members and she started smiling. 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 Daughter reported that they tried doing puzzles with the patient but she is not interested as she has not done anything in the past. However she is more interested in doing colors and painting. We discussed about different activities including crochet and daughter reported that patient has done that in the past. Patient started smiling during the interview. She remains quite most of the time. She has good sleep and has been compliant with her medications. Her daughter is planning to have the hip surgery in the next month and she will make her next appointment according to her schedule. She reported that patient needs help and she is going to continue with her appointments on a regular basis. Patient denied having any suicidal ideations or plans. She is receptive to her medications.   Patient appears to have memory deficits at this time as she has recent and remote memory and cognitive decline.         Associated Signs/Symptoms: Depression Symptoms:  psychomotor retardation, weight loss, (Hypo)  Manic Symptoms:  denied Anxiety Symptoms:  denied Psychotic Symptoms:  denied PTSD Symptoms: Negative NA  Past Psychiatric History:  Admitted to [facility] in past. She was diagnosed with bipolar disorder many years ago. She has been compliant with her medications.   Previous Psychotropic Medications: Risperdal  Substance Abuse History in the last 12 months:  No.  Consequences of Substance Abuse: Negative NA  Past Medical History:  Past Medical History:  Diagnosis Date  . Anxiety   . Bipolar disorder ([place])   . Depression   . Diabetes mellitus, type II [facility])     Past Surgical History:  Procedure Laterality Date  . CERVIX SURGERY      Family Psychiatric History:  H/o  anxiety in her family  Family History:  Family History  Problem Relation Age of Onset  . Liver cancer Mother   . Heart attack Father   . Heart disease Sister   . Heart disease Brother   . Heart disease Brother   . Heart disease Brother     Social History:   Social History   Socioeconomic History  . Marital status: Widowed    Spouse name: None  . Number of children: None  . Years of education: None  . Highest education level: None  Social Needs  . Financial resource strain: None  . Food insecurity - worry: None  . Food insecurity - inability: None  . Transportation needs - medical: None  . Transportation needs - non-medical: None  Occupational History  . None  Tobacco Use  . Smoking status: Current Every Day Smoker    Packs/day: 1.00    Years: 46.00    Pack years: 46.00    Types: Cigarettes    Start date: 09/16/1964  . Smokeless tobacco: Never Used  Substance and Sexual Activity  . Alcohol use: No    Alcohol/week: 0.0 oz  . Drug use: No  . Sexual activity: Not Currently  Other Topics Concern  . None  Social History Narrative  . None    Additional Social History:  Lives with daughter. Widowed.   Allergies:   Allergies  Allergen Reactions  . Demerol [Meperidine] Other (See  Comments)    Pt states she "turned green"     Metabolic Disorder Labs: No results found for: HGBA1C, MPG No results found for: PROLACTIN No results found for: CHOL, TRIG, HDL, CHOLHDL, VLDL, LDLCALC   Current Medications: Current Outpatient Medications  Medication Sig Dispense Refill  . busPIRone (BUSPAR) 10 MG tablet Take 1 tablet (10 mg total) by mouth 2 (two) times daily. 10 tablet 0  . amoxicillin-clavulanate (AUGMENTIN) 875-125 MG tablet Take 1 tablet by mouth 2 (two) times daily. (Patient not taking: Reported on 04/11/2017) 14 tablet 0  . atorvastatin (LIPITOR) 40 MG tablet Take 40 mg by mouth at bedtime.    Marland Kitchen JANUVIA 25 MG tablet Take 25 mg by mouth daily.   0  . losartan (COZAAR) 100 MG tablet Take 100 mg by mouth daily.    . memantine (NAMENDA) 5 MG tablet Take 1 tablet (5 mg total) daily by mouth. (Patient not taking: Reported on 04/11/2017) 30 tablet 1   No current facility-administered medications for this visit.     Neurologic: Headache: No Seizure: No Paresthesias:No  Musculoskeletal: Strength & Muscle Tone: within normal limits Gait & Station: normal Patient leans: N/A  Psychiatric Specialty Exam: ROS  Blood pressure 96/64, pulse 80, temperature (!) 97.3 F (36.3 C), temperature source Oral, weight 139 lb 9.6 oz (63.3 kg).Body mass index is 26.38 kg/m.  General Appearance: Casual  Eye Contact:  Fair  Speech:  Slow  Volume:  Decreased  Mood:  Depressed  Affect:  Appropriate  Thought Process:  Coherent and Goal Directed  Orientation:  Full (Time, Place, and Person)  Thought Content:  WDL  Suicidal Thoughts:  No  Homicidal Thoughts:  No  Memory:  Immediate;   Fair Recent;   Fair Remote;   Fair  Judgement:  Fair  Insight:  Fair  Psychomotor Activity:  Normal  Concentration:  Concentration: Fair and Attention Span: Fair  Recall:  [employer] of Knowledge:Fair  Language: Fair  Akathisia:  No  Handed:  Right  AIMS (if indicated):    Assets:   Communication Skills Desire for Improvement Physical Health Social Support  ADL's:  Intact  Cognition: WNL  Sleep:  poor    Treatment Plan Summary: Medication management   Discussed with patient and her daughter about the medications treatment risks benefits and alternatives.  BuSpar 10 mg by mouth twice a day for anxiety.  I will start her on Namenda 10 mg by mouth daily. Discussed with daughter about the same and she agreed with the plan.  Discussed with her about the side effects of the medications and he agreed with the plan.   She will continue to follow-up in 2 month or earlier depending on the symptoms.      More than 50% of the time spent in psychoeducation, counseling and coordination of care.  This note was generated in part or whole with voice recognition software. Voice regonition is usually quite accurate but there are transcription errors that can and very often do occur. I apologize for any typographical errors that were not detected and corrected.    Rainey Pines, MD 2/18/201910:57 AM

## 2017-06-06 ENCOUNTER — Ambulatory Visit: Payer: Medicare Other | Admitting: Psychiatry

## 2017-06-13 ENCOUNTER — Ambulatory Visit: Payer: Medicare Other | Admitting: Psychiatry

## 2017-07-16 ENCOUNTER — Emergency Department: Payer: Medicare Other

## 2017-07-16 ENCOUNTER — Encounter: Payer: Self-pay | Admitting: Emergency Medicine

## 2017-07-16 ENCOUNTER — Emergency Department
Admission: EM | Admit: 2017-07-16 | Discharge: 2017-07-16 | Disposition: A | Discharge status: Home / Self Care | Payer: Medicare Other | Attending: Emergency Medicine | Admitting: Emergency Medicine

## 2017-07-16 DIAGNOSIS — F17210 Nicotine dependence, cigarettes, uncomplicated: Secondary | ICD-10-CM | POA: Insufficient documentation

## 2017-07-16 DIAGNOSIS — E119 Type 2 diabetes mellitus without complications: Secondary | ICD-10-CM | POA: Diagnosis not present

## 2017-07-16 DIAGNOSIS — R079 Chest pain, unspecified: Secondary | ICD-10-CM | POA: Diagnosis present

## 2017-07-16 DIAGNOSIS — K8020 Calculus of gallbladder without cholecystitis without obstruction: Secondary | ICD-10-CM | POA: Insufficient documentation

## 2017-07-16 LAB — BASIC METABOLIC PANEL
ANION GAP: 5 (ref 5–15)
BUN: 35 mg/dL — ABNORMAL HIGH (ref 6–20)
CHLORIDE: 106 mmol/L (ref 101–111)
CO2: 24 mmol/L (ref 22–32)
Calcium: 8.6 mg/dL — ABNORMAL LOW (ref 8.9–10.3)
Creatinine, Ser: 1.78 mg/dL — ABNORMAL HIGH (ref 0.44–1.00)
GFR calc non Af Amer: 27 mL/min — ABNORMAL LOW (ref >60)
GFR, EST AFRICAN AMERICAN: 32 mL/min — AB (ref >60)
Glucose, Bld: 246 mg/dL — ABNORMAL HIGH (ref 65–99)
Potassium: 4.3 mmol/L (ref 3.5–5.1)
SODIUM: 135 mmol/L (ref 135–145)

## 2017-07-16 LAB — CBC
HCT: 36.7 % (ref 35.0–47.0)
HEMOGLOBIN: 12 g/dL (ref 12.0–16.0)
MCH: 27.8 pg (ref 26.0–34.0)
MCHC: 32.8 g/dL (ref 32.0–36.0)
MCV: 84.9 fL (ref 80.0–100.0)
Platelets: 230 10*3/uL (ref 150–440)
RBC: 4.33 MIL/uL (ref 3.80–5.20)
RDW: 15.6 % — ABNORMAL HIGH (ref 11.5–14.5)
WBC: 8.6 10*3/uL (ref 3.6–11.0)

## 2017-07-16 LAB — HEPATIC FUNCTION PANEL
ALK PHOS: 103 U/L (ref 38–126)
ALT: 14 U/L (ref 14–54)
AST: 18 U/L (ref 15–41)
Albumin: 3.3 g/dL — ABNORMAL LOW (ref 3.5–5.0)
BILIRUBIN TOTAL: 0.4 mg/dL (ref 0.3–1.2)
Total Protein: 7 g/dL (ref 6.5–8.1)

## 2017-07-16 LAB — TROPONIN I

## 2017-07-16 IMAGING — US US ABDOMEN LIMITED
1 series · 14 of 25 positions shown · non-contrast
Comparison: None.

CLINICAL DATA: Acute right upper quadrant abdominal pain.

EXAM:
ULTRASOUND ABDOMEN LIMITED RIGHT UPPER QUADRANT

[Series 1: us abdomen limited · 14 of 36 slices shown]
[im 1/36]
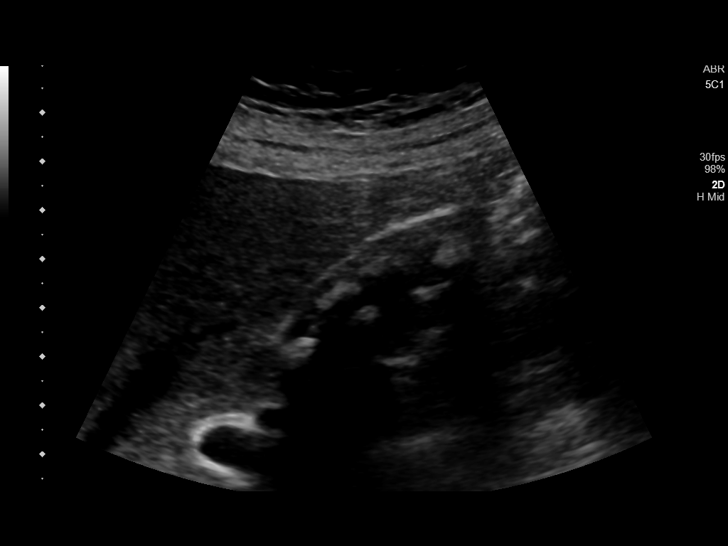
[im 3/36]
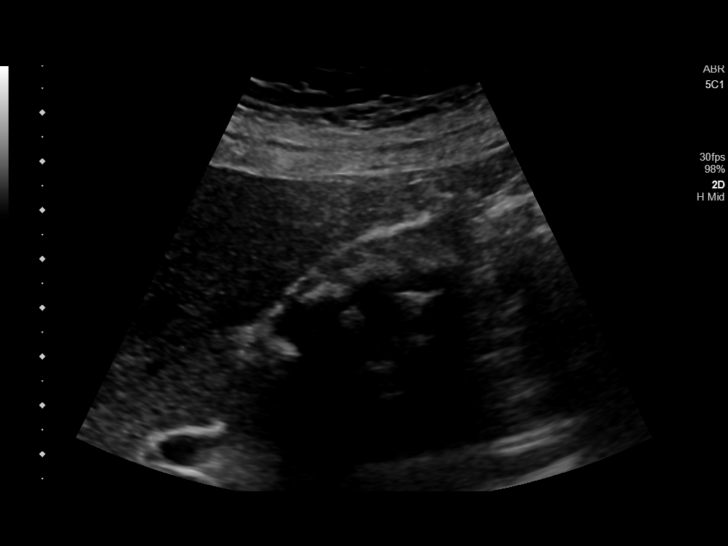
[im 6/36]
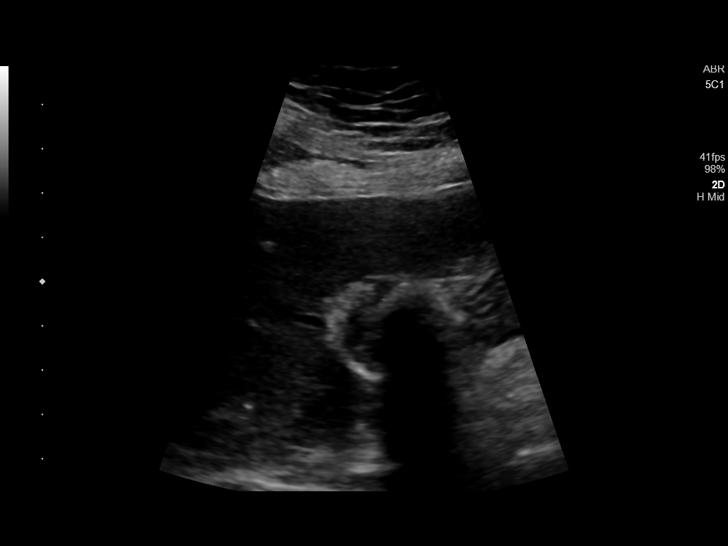
[im 9/36]
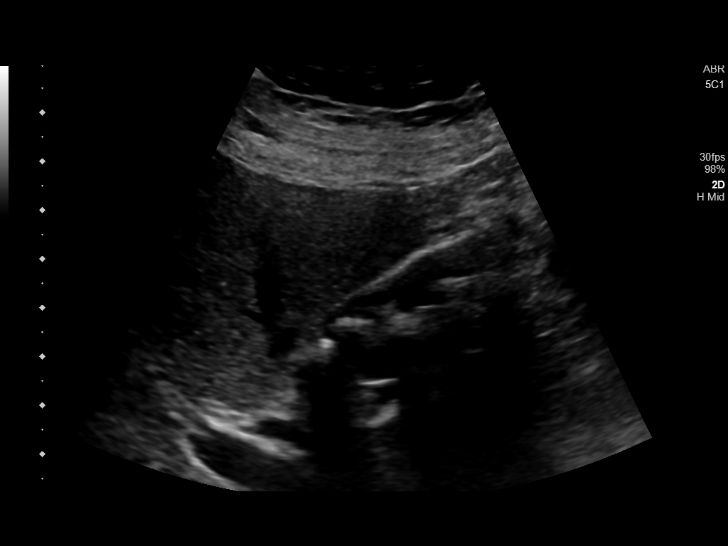
[im 12/36]
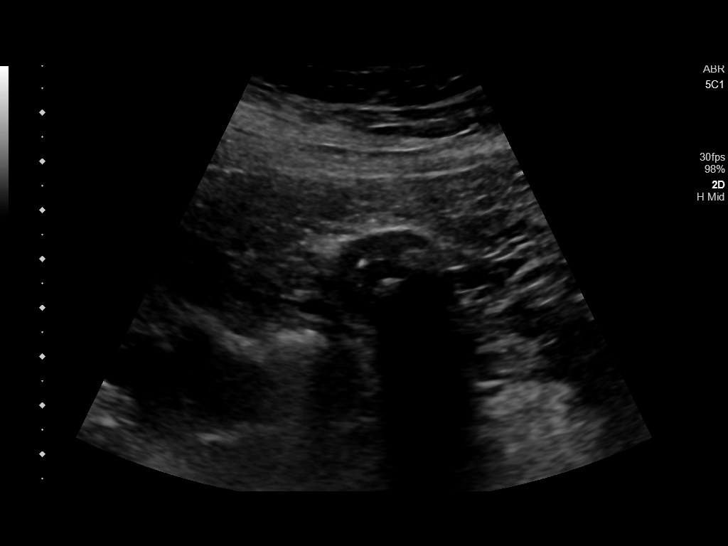
[im 14/36]
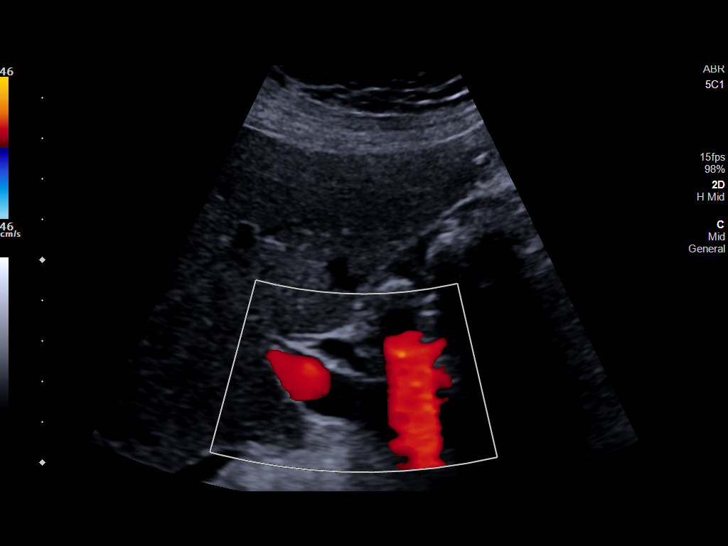
[im 17/36]
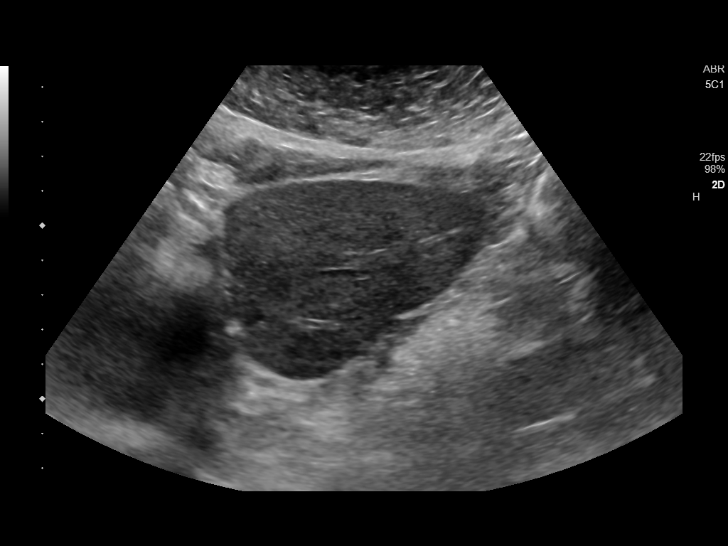
[im 19/36]
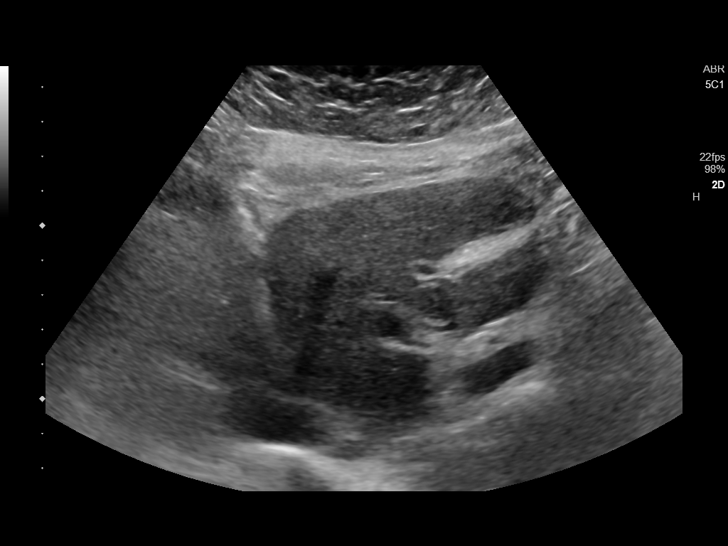
[im 22/36]
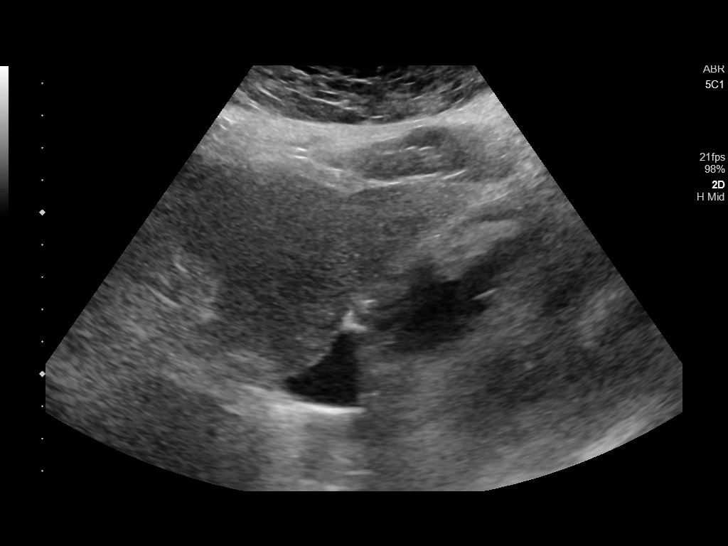
[im 24/36]
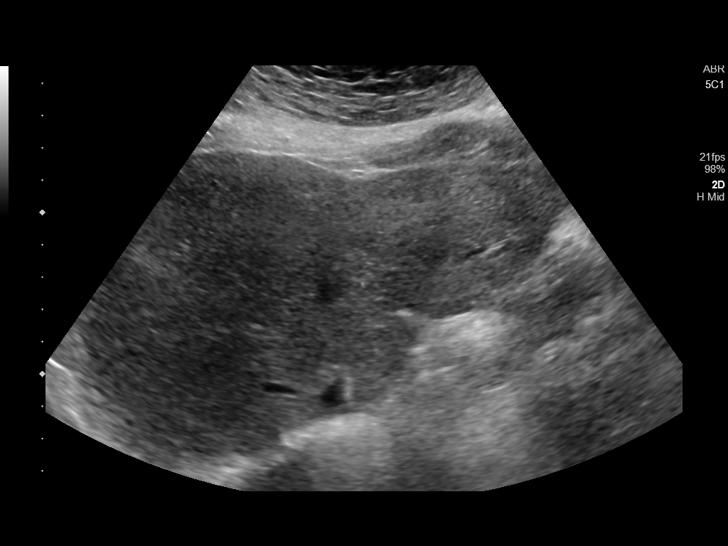
[im 27/36]
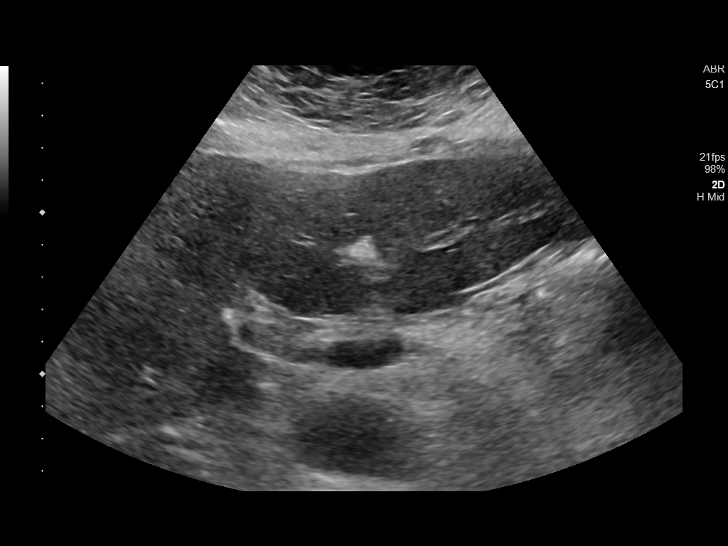
[im 30/36]
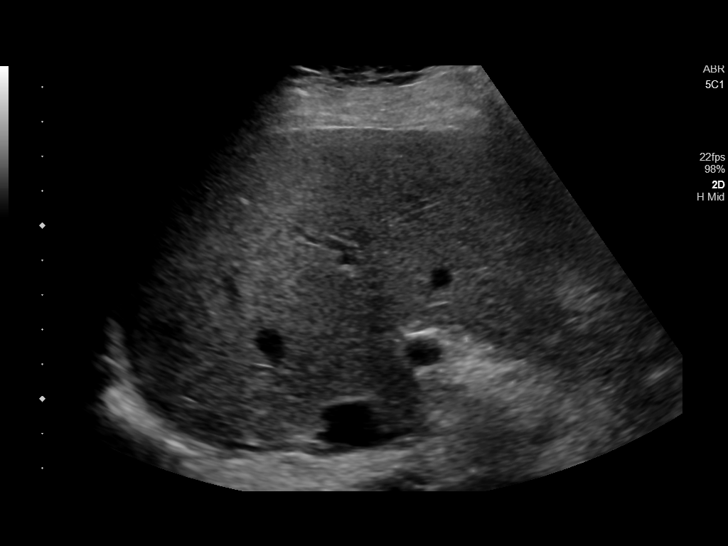
[im 33/36]
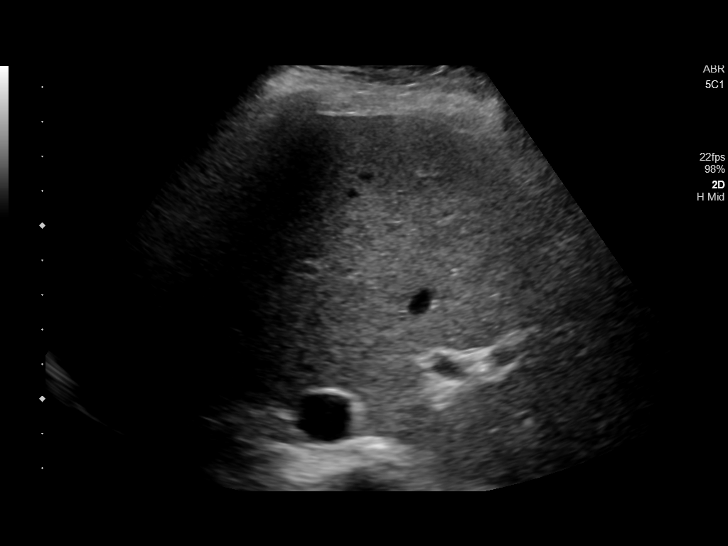
[im 36/36]
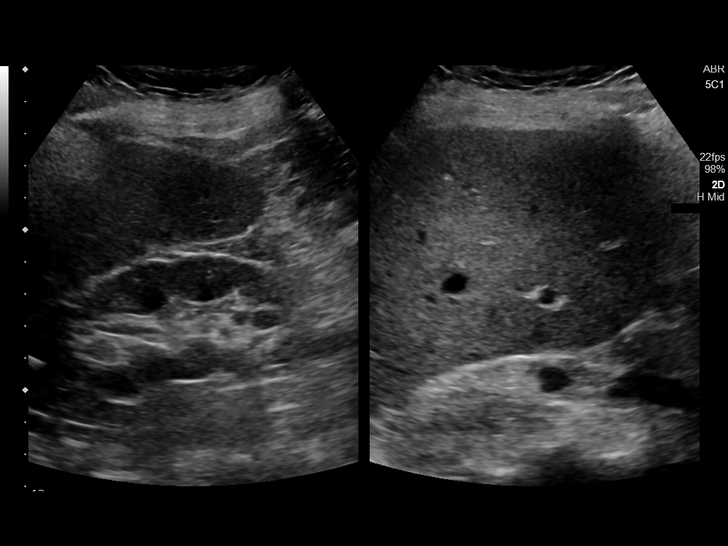

[14 of 25 positions shown; findings below may reference images not displayed]

FINDINGS: Gallbladder:

Multiple gallstones are noted without gallbladder wall thickening or
pericholecystic fluid. No sonographic Murphy's sign is noted.
Largest stone measures 1 cm.

Common bile duct:

Diameter: 5 mm which is within normal limits.

Liver:

No focal lesion identified. Within normal limits in parenchymal
echogenicity. Portal vein is patent on color Doppler imaging with
normal direction of blood flow towards the liver.
IMPRESSION: Cholelithiasis without cholecystitis. No other abnormality seen in
the right upper quadrant of the abdomen.

## 2017-07-16 IMAGING — CR DG CHEST 2V
1 series · 2 of 2 positions shown · non-contrast
Comparison: [DATE].

CLINICAL DATA: Chest pain since this morning.  Smoker.

EXAM:
CHEST - 2 VIEW

[Series 1: dg chest 2 view · 0.14mm/px · 2 of 2 slices shown]
[im 1/2]
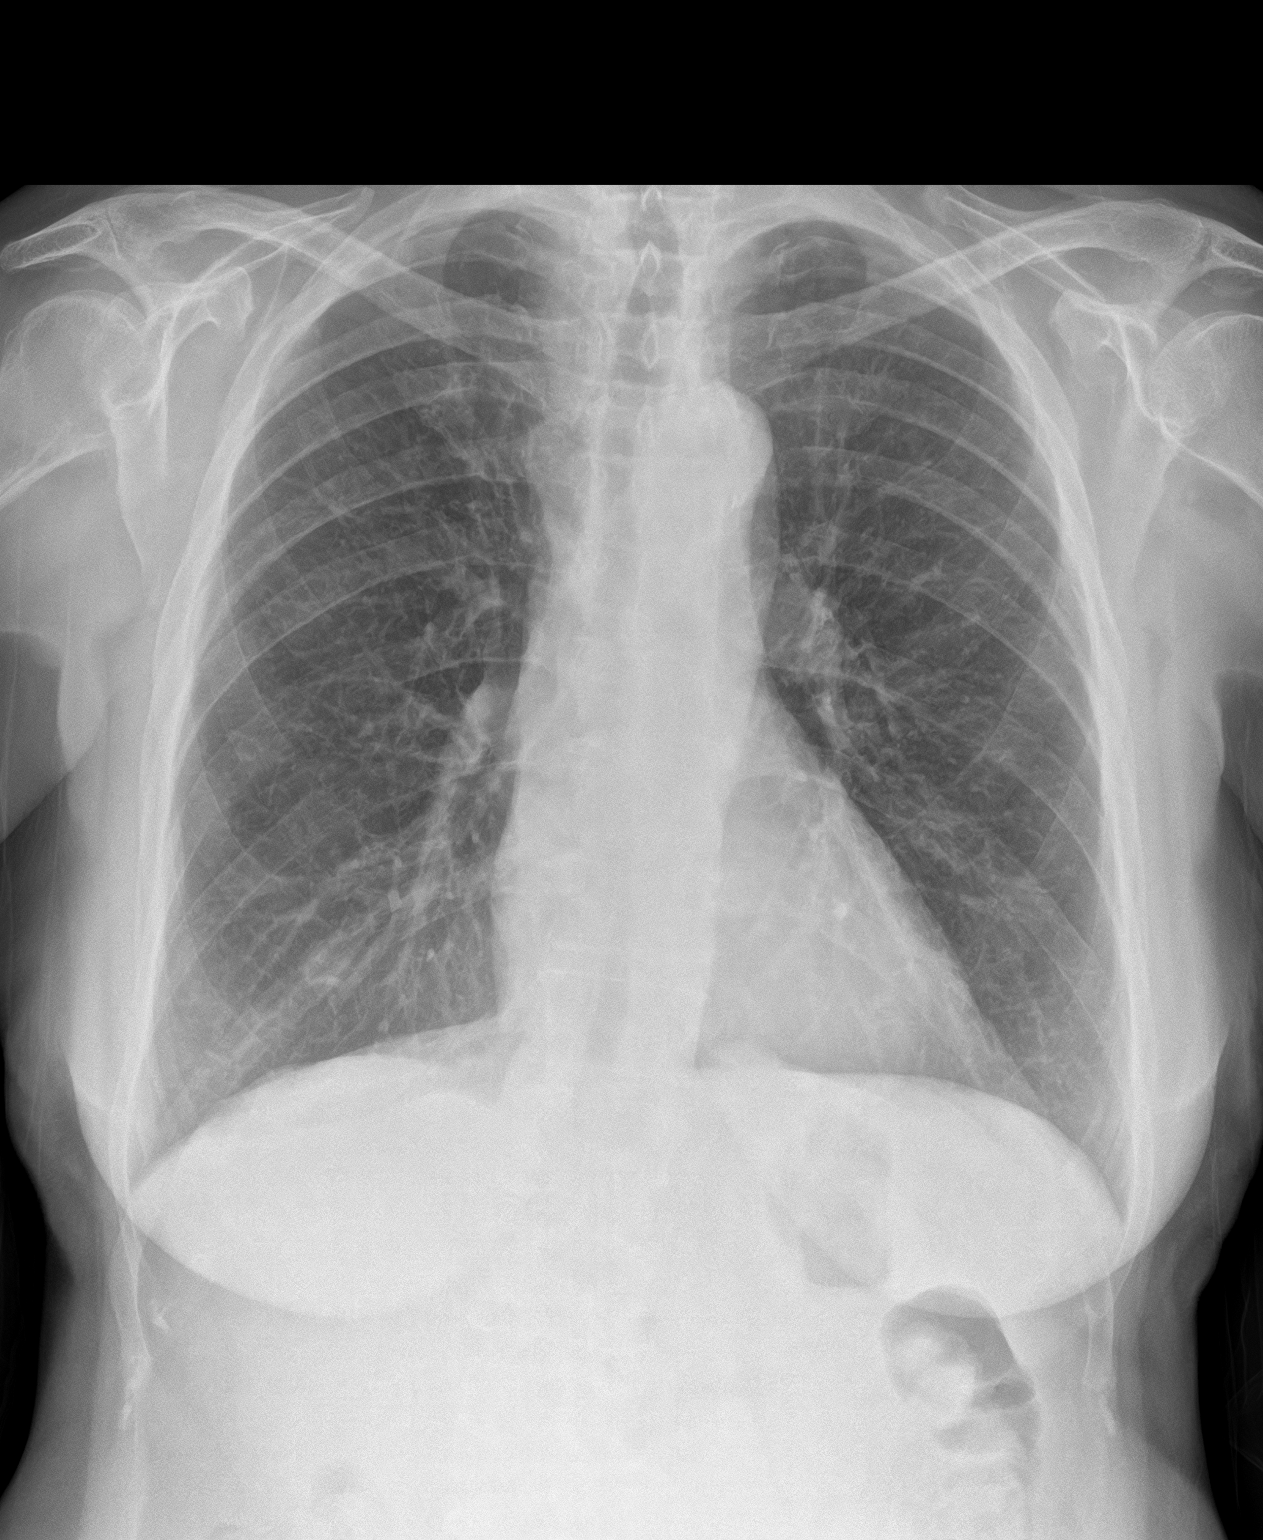
[im 2/2]
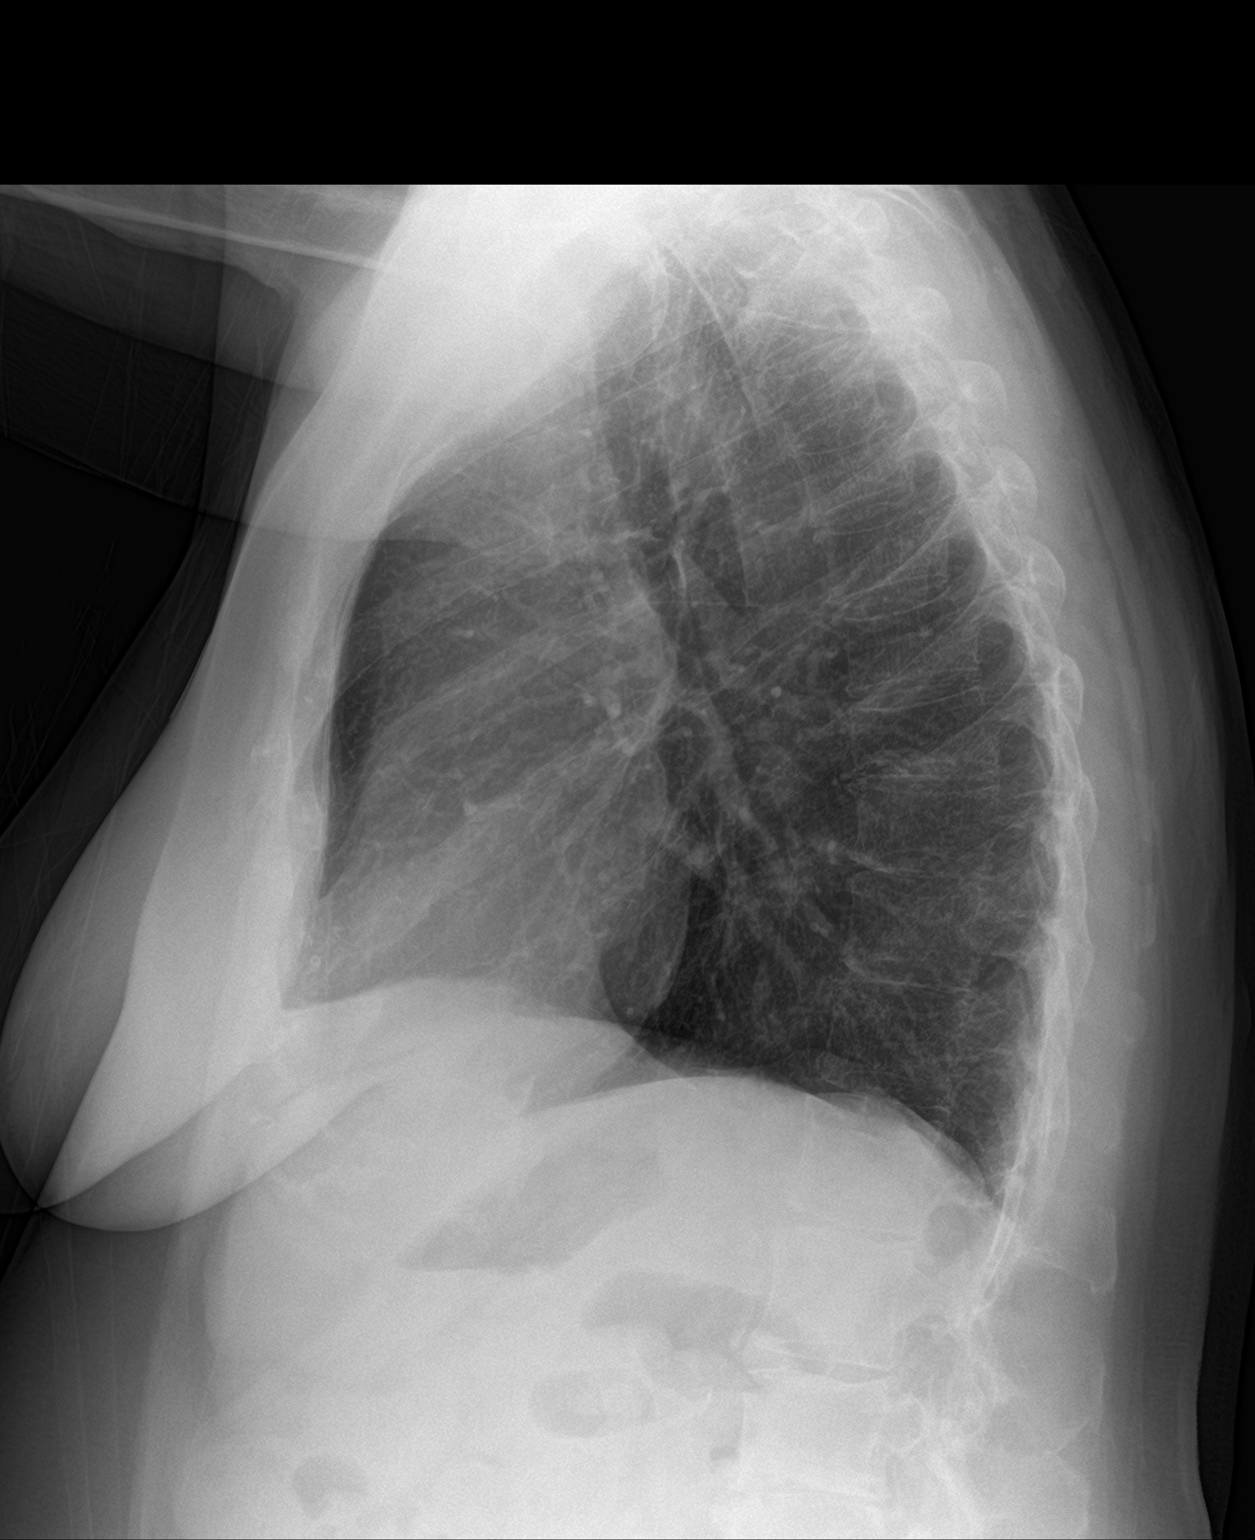

[2 of 2 positions shown; findings below may reference images not displayed]

FINDINGS: Normal sized heart. Clear lungs. The lungs are hyperexpanded with
mild diffuse peribronchial thickening. Mild scoliosis. Minimal
thoracic spine degenerative changes.
IMPRESSION: Mild to moderate changes of COPD and chronic bronchitis. No acute
abnormality.

## 2017-07-16 MED ORDER — OXYCODONE-ACETAMINOPHEN 5-325 MG PO TABS: 1.0000 | ORAL_TABLET | Freq: Three times a day (TID) | ORAL | 0 refills | Status: DC | PRN

## 2017-07-16 NOTE — ED Notes (Signed)
Pt c/o R lower rib pain x 2 days. Pt denies fevers, chills, N/V/D. Pt states pain worse with coughing. Denies SHOB, states pain worse with some movement. Pt is alert and oriented. Has been trying to ease pain with OTC pain medication without relief. Pt denies injury or trauma.

## 2017-07-16 NOTE — ED Notes (Signed)
NAD noted at time of D/C. Pt denies questions or concerns. Pt ambulatory to the lobby at this time with her granddaughter. Pt refused wheelchair to the lobby.

## 2017-07-16 NOTE — ED Notes (Signed)
Pt returned from US

## 2017-07-16 NOTE — ED Notes (Signed)
MD to bedside at this time 

## 2017-07-16 NOTE — ED Provider Notes (Signed)
[facility] Emergency Department Provider Note       Time seen: ----------------------------------------- 12:31 PM on 07/16/2017 -----------------------------------------   I have reviewed the triage vital signs and the nursing notes.  HISTORY   Chief Complaint Chest Pain    HPI Miranda Ryan is a 72 y.o. female with a history of anxiety, bipolar disorder, depression, diabetes who presents to the ED for this pain for the past 2 days.  Patient points to her right lower rib area reports the pain is more painful with movement.  She denies any trauma, describes the pain as a pulling sensation.  She denies nausea shortness of breath.  Past Medical History:  Diagnosis Date  . Anxiety   . Bipolar disorder ([place])   . Depression   . Diabetes mellitus, type II [facility])     Patient Active Problem List   Diagnosis Date Noted  . Acute exacerbation of chronic obstructive pulmonary disease (COPD) ([place]) 04/20/2016    Past Surgical History:  Procedure Laterality Date  . CERVIX SURGERY      Allergies Demerol [meperidine]  Social History Social History   Tobacco Use  . Smoking status: Current Every Day Smoker    Packs/day: 1.00    Years: 46.00    Pack years: 46.00    Types: Cigarettes    Start date: 09/16/1964  . Smokeless tobacco: Never Used  Substance Use Topics  . Alcohol use: No    Alcohol/week: 0.0 oz  . Drug use: No   Review of Systems Constitutional: Negative for fever. Cardiovascular: Positive for chest pain Respiratory: Negative for shortness of breath. Gastrointestinal: Negative for abdominal pain, vomiting and diarrhea. Musculoskeletal: Negative for back pain. Skin: Negative for rash. Neurological: Negative for headaches, focal weakness or numbness.  All systems negative/normal/unremarkable except as stated in the HPI  ____________________________________________   PHYSICAL EXAM:  VITAL SIGNS: ED Triage Vitals [07/16/17 1115]   Enc Vitals Group     BP (!) 90/59     Pulse Rate 89     Resp 16     Temp 97.8 F (36.6 C)     Temp Source Oral     SpO2 97 %     Weight 120 lb (54.4 kg)     Height 5' (1.524 m)     Head Circumference      Peak Flow      Pain Score 2     Pain Loc      Pain Edu?      Excl. in [place]?    Constitutional: Alert and oriented. Well appearing and in no distress. Eyes: Conjunctivae are normal. Normal extraocular movements. ENT   Head: Normocephalic and atraumatic.   Nose: No congestion/rhinnorhea.   Mouth/Throat: Mucous membranes are moist.   Neck: No stridor. Cardiovascular: Normal rate, regular rhythm. No murmurs, rubs, or gallops. Respiratory: Normal respiratory effort without tachypnea nor retractions. Breath sounds are clear and equal bilaterally. No wheezes/rales/rhonchi. Gastrointestinal: Soft, very mild right upper quadrant tenderness, no rebound or guarding.  Normal bowel sounds. Musculoskeletal: Nontender with normal range of motion in extremities. No lower extremity tenderness nor edema. Neurologic:  Normal speech and language. No gross focal neurologic deficits are appreciated.  Skin:  Skin is warm, dry and intact. No rash noted. Psychiatric: Mood and affect are normal. Speech and behavior are normal.  ____________________________________________  EKG: Interpreted by me.  Sinus rhythm rate 84 bpm, left axis deviation, LVH, normal QT  ____________________________________________  ED COURSE:  As part of  my medical decision making, I reviewed the following data within the [place] History obtained from family if available, nursing notes, old chart and ekg, as well as notes from prior ED visits. Patient presented for thoracoabdominal pain, we will assess with labs and imaging as indicated at this time.   Procedures ____________________________________________   LABS (pertinent positives/negatives)  Labs Reviewed  BASIC METABOLIC PANEL -  Abnormal; Notable for the following components:      Result Value   Glucose, Bld 246 (*)    BUN 35 (*)    Creatinine, Ser 1.78 (*)    Calcium 8.6 (*)    GFR calc non Af Amer 27 (*)    GFR calc Af Amer 32 (*)    All other components within normal limits  CBC - Abnormal; Notable for the following components:   RDW 15.6 (*)    All other components within normal limits  TROPONIN I  HEPATIC FUNCTION PANEL    RADIOLOGY Images were viewed by me  Right upper quadrant ultrasound  Chest x-ray negative IMPRESSION: Cholelithiasis without cholecystitis. No other abnormality seen in the right upper quadrant of the abdomen. ____________________________________________  DIFFERENTIAL DIAGNOSIS   Musculoskeletal pain, GERD, pyelonephritis, ileocolic  FINAL ASSESSMENT AND PLAN  Abdominal pain, gallstones   Plan: The patient had presented for seemingly right upper quadrant pain although it is difficult to pinpoint. Patient's labs were unremarkable or at her baseline. Patient's imaging was negative regarding her chest x-ray but her ultrasound did reveal gallstones without signs of cholecystitis.  This is likely the cause of her intermittent pain.  She will be referred to general surgery for outpatient follow-up and likely cholecystectomy.   Laurence Aly, MD   Note: This note was generated in part or whole with voice recognition software. Voice recognition is usually quite accurate but there are transcription errors that can and very often do occur. I apologize for any typographical errors that were not detected and corrected.     Earleen Newport, MD 07/16/17 [phone]

## 2017-07-16 NOTE — ED Triage Notes (Signed)
Patient presents to the ED with chest pain x 2 days.  Patient points to her right lower rib area.  Patient states area is more painful with movement.  Patient denies trauma to the area.  Patient describes pain as, "pulling".  Patient denies shortness of breath or nausea.  Reports some weakness.

## 2017-07-26 ENCOUNTER — Encounter: Payer: Self-pay | Admitting: Surgery

## 2017-07-26 ENCOUNTER — Ambulatory Visit (INDEPENDENT_AMBULATORY_CARE_PROVIDER_SITE_OTHER): Payer: Medicare Other | Admitting: Surgery

## 2017-07-26 VITALS — BP 113/67 | HR 87 | Temp 97.7°F | Ht 60.0 in | Wt 141.0 lb

## 2017-07-26 DIAGNOSIS — K8020 Calculus of gallbladder without cholecystitis without obstruction: Secondary | ICD-10-CM | POA: Diagnosis not present

## 2017-07-26 NOTE — Patient Instructions (Signed)
    Please call our office when you would like to move forward with surgery.

## 2017-07-26 NOTE — Progress Notes (Signed)
Patient ID: Miranda Ryan, female   DOB: [birth year], 72 y.o.   MRN: [id number]  HPI Miranda Ryan is a 72 y.o. female with significant history consistent with bipolar disorder, mild dementia and diabetes.  He recently evaluated in the emergency room for right upper quadrant pain.  At that time patient reported the pain was sharp moderate in intensity and lasted for a few hours.  No specific alleviating or aggravating factors.  No vomiting no evidence of biliary obstruction jaundice fevers or chills. Is able to perform more than 4 METS of activity without any shortness of breath or chest pain.  She is accompanied by her daughter who is the power of attorney.   Personal review right upper quadrant ultrasound that was performed in the ER showing evidence of cholelithiasis.  No cholecystitis normal common bile duct.  LFTs are normal as well as CBC. HPI  Past Medical History:  Diagnosis Date  . Acute exacerbation of chronic obstructive pulmonary disease (COPD) ([place]) 04/20/2016  . Anxiety   . Bipolar disorder ([place])   . Depression   . Diabetes mellitus, type II [facility])     Past Surgical History:  Procedure Laterality Date  . CERVIX SURGERY      Family History  Problem Relation Age of Onset  . Liver cancer Mother   . Heart attack Father   . Heart disease Sister   . Heart disease Brother   . Heart disease Brother   . Heart disease Brother     Social History Social History   Tobacco Use  . Smoking status: Current Every Day Smoker    Packs/day: 1.00    Years: 46.00    Pack years: 46.00    Types: Cigarettes    Start date: 09/16/1964  . Smokeless tobacco: Never Used  Substance Use Topics  . Alcohol use: No    Alcohol/week: 0.0 oz  . Drug use: No    Allergies  Allergen Reactions  . Demerol [Meperidine] Other (See Comments)    Pt states she "turned green"     Current Outpatient Medications  Medication Sig Dispense Refill  . atorvastatin (LIPITOR) 40 MG tablet Take 40 mg by mouth  at bedtime.    . busPIRone (BUSPAR) 10 MG tablet Take 1 tablet (10 mg total) by mouth 2 (two) times daily. 180 tablet 1   No current facility-administered medications for this visit.      Review of Systems Full ROS  was asked and was negative except for the information on the HPI  Physical Exam Blood pressure 113/67, pulse 87, temperature 97.7 F (36.5 C), temperature source Oral, height 5' (1.524 m), weight 64 kg (141 lb). CONSTITUTIONAL: NAD EYES: Pupils are equal, round, and reactive to light, Sclera are non-icteric. EARS, NOSE, MOUTH AND THROAT: The oropharynx is clear. The oral mucosa is pink and moist. Hearing is intact to voice. LYMPH NODES:  Lymph nodes in the neck are normal. RESPIRATORY:  Lungs are clear. There is normal respiratory effort, with equal breath sounds bilaterally, and without pathologic use of accessory muscles. CARDIOVASCULAR: Heart is regular without murmurs, gallops, or rubs. GI: The abdomen is  soft, nontender, and nondistended. There are no palpable masses. There is no hepatosplenomegaly. There are normal bowel sounds in all quadrants. GU: Rectal deferred.   MUSCULOSKELETAL: Normal muscle strength and tone. No cyanosis or edema.   SKIN: Turgor is good and there are no pathologic skin lesions or ulcers. NEUROLOGIC: Motor and sensation is grossly normal. Cranial nerves  are grossly intact. PSYCH:  Oriented to person, place and time. Affect is normal. She answers to simple questions but does not elaborate complex responses.  Data Reviewed  I have personally reviewed the patient's imaging, laboratory findings and medical records.    Assessment/Plan Symptomatic cholelithiasis.  Discussed with the patient and the family detail about her disease process.  I do recommend laparoscopic cholecystectomy. The daughter who is the power of attorney has an upcoming hip surgery less than 10 days ago and wishes to postpone the surgery at least for 4 to 6 weeks.  Discussed  with the patient and with the family in detail about laparoscopic cholecystectomy.The risks, benefits, complications, treatment options, and expected outcomes were discussed with the patient. The possibilities of bleeding, recurrent infection, finding a normal gallbladder, perforation of viscus organs, damage to surrounding structures, bile leak, abscess formation, needing a drain placed, the need for additional procedures, reaction to medication, pulmonary aspiration,  failure to diagnose a condition, the possible need to convert to an open procedure, and creating a complication requiring transfusion or operation were discussed with the patient. The patient and/or family concurred with the proposed plan, . We will see her in 4 weeks and schedule a lap chole at that time.   Caroleen Hamman, MD FACS General Surgeon 07/26/2017, 2:47 PM

## 2017-08-08 ENCOUNTER — Emergency Department: Payer: Medicare Other

## 2017-08-08 ENCOUNTER — Emergency Department
Admission: EM | Admit: 2017-08-08 | Discharge: 2017-08-08 | Disposition: A | Discharge status: Home / Self Care | Payer: Medicare Other | Attending: Emergency Medicine | Admitting: Emergency Medicine

## 2017-08-08 ENCOUNTER — Encounter: Payer: Self-pay | Admitting: Emergency Medicine

## 2017-08-08 DIAGNOSIS — F319 Bipolar disorder, unspecified: Secondary | ICD-10-CM | POA: Insufficient documentation

## 2017-08-08 DIAGNOSIS — E119 Type 2 diabetes mellitus without complications: Secondary | ICD-10-CM | POA: Diagnosis not present

## 2017-08-08 DIAGNOSIS — K4090 Unilateral inguinal hernia, without obstruction or gangrene, not specified as recurrent: Secondary | ICD-10-CM | POA: Diagnosis not present

## 2017-08-08 DIAGNOSIS — Z79899 Other long term (current) drug therapy: Secondary | ICD-10-CM | POA: Insufficient documentation

## 2017-08-08 DIAGNOSIS — J449 Chronic obstructive pulmonary disease, unspecified: Secondary | ICD-10-CM | POA: Diagnosis not present

## 2017-08-08 DIAGNOSIS — M25552 Pain in left hip: Secondary | ICD-10-CM | POA: Diagnosis present

## 2017-08-08 DIAGNOSIS — M1612 Unilateral primary osteoarthritis, left hip: Secondary | ICD-10-CM | POA: Diagnosis not present

## 2017-08-08 DIAGNOSIS — F419 Anxiety disorder, unspecified: Secondary | ICD-10-CM | POA: Diagnosis not present

## 2017-08-08 DIAGNOSIS — F17210 Nicotine dependence, cigarettes, uncomplicated: Secondary | ICD-10-CM | POA: Diagnosis not present

## 2017-08-08 IMAGING — CR DG HIP (WITH OR WITHOUT PELVIS) 2-3V*L*
1 series · 3 of 3 positions shown · non-contrast
Comparison: None in PACs

CLINICAL DATA: Left hip pain for the past week with no known
injury. Pain is greatest when low lying down.

EXAM:
DG HIP (WITH OR WITHOUT PELVIS) 2-3V LEFT

[Series 1: dg hip unilat w or w/o pelvis 2-3 views  · non-contrast · 0.14mm/px · 3 of 3 slices shown]
[im 1/3]
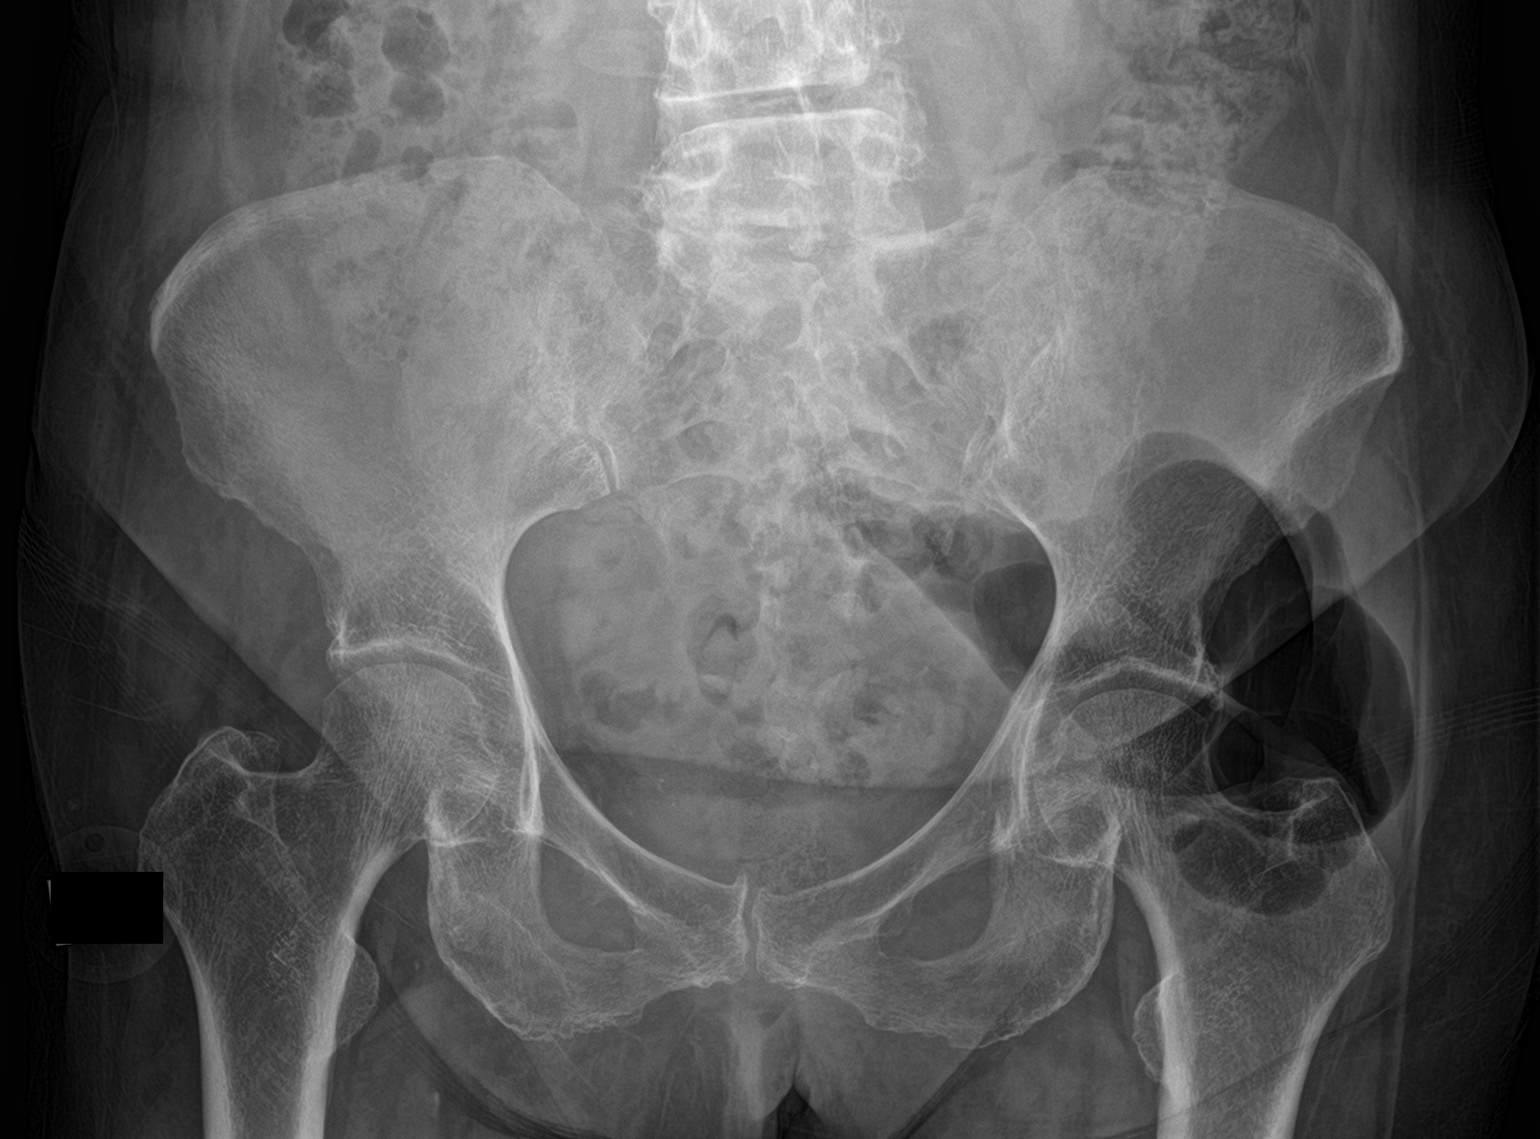
[im 2/3]
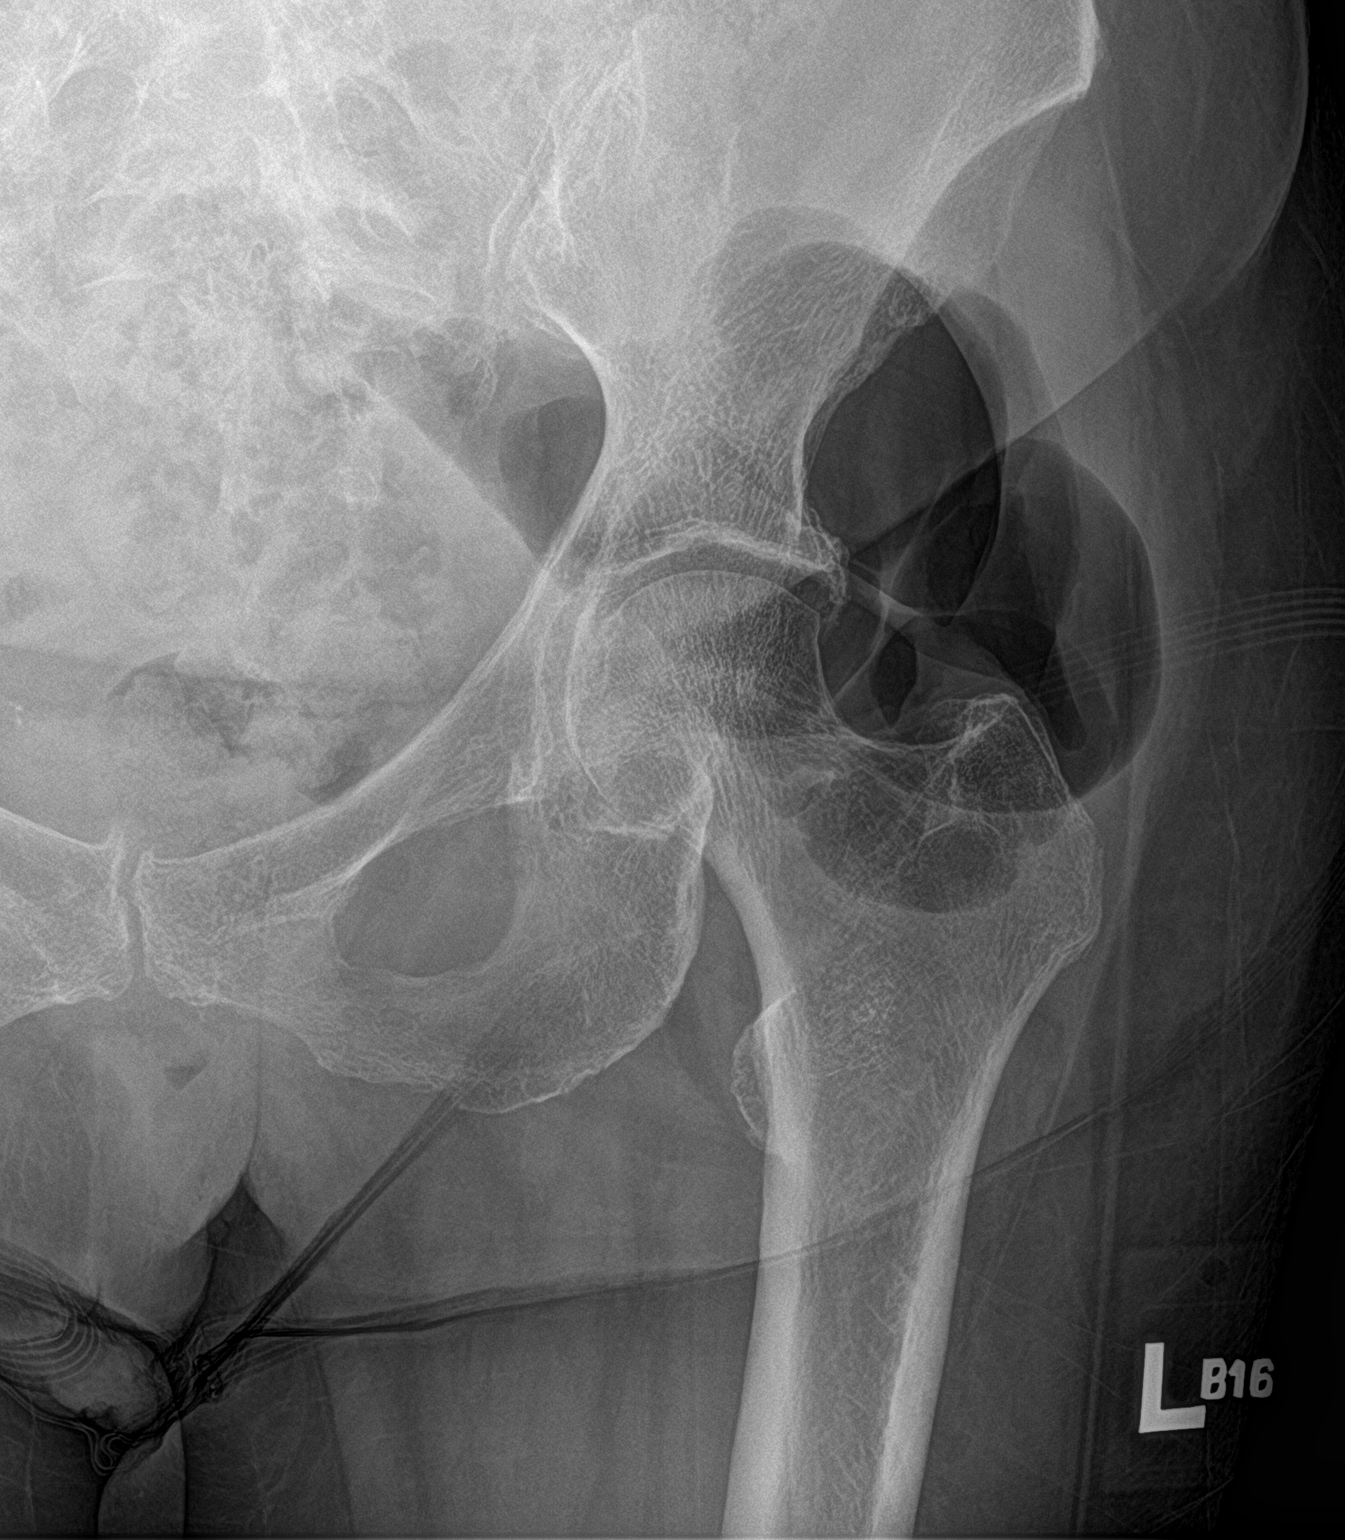
[im 3/3]
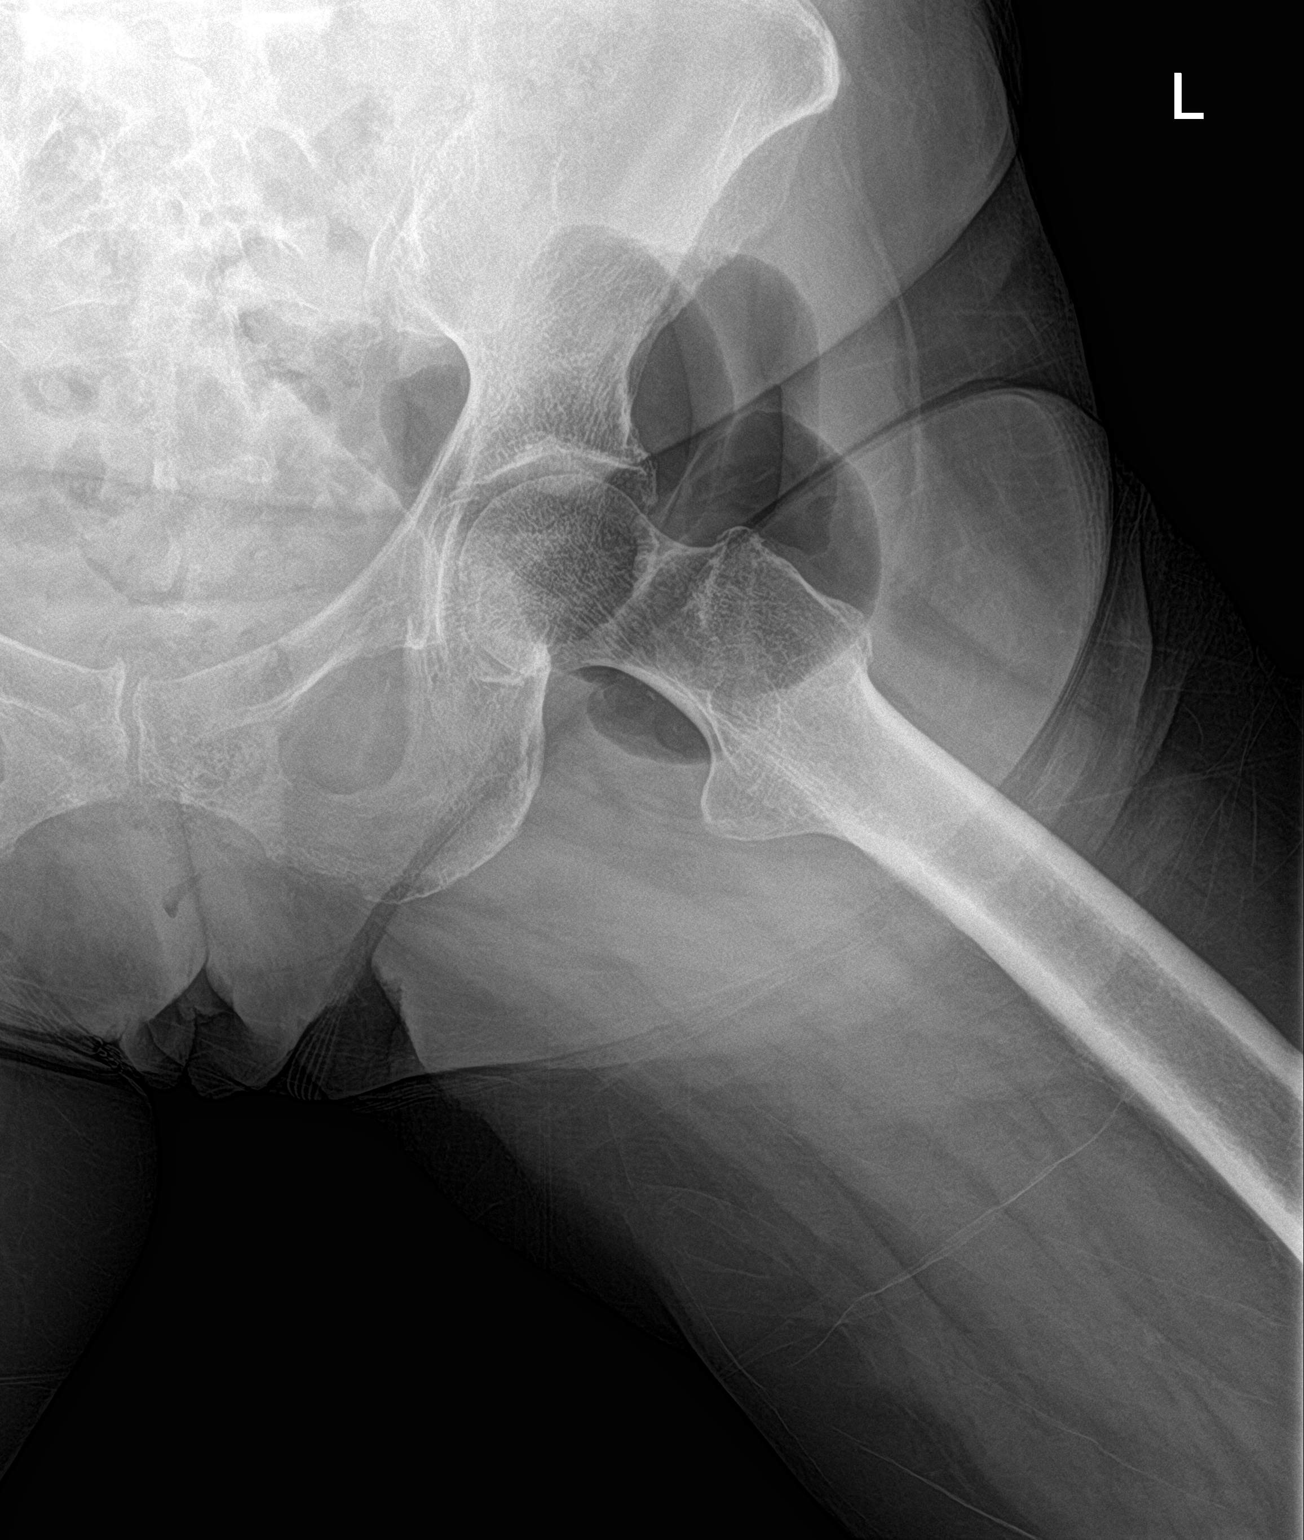

[3 of 3 positions shown; findings below may reference images not displayed]

FINDINGS: There is bowel gas overlying the lateral aspect of the bony pelvis
that may lie within an abdominal hernia. The bony pelvis is
subjectively adequately mineralized for age. There is no acute or
healing fracture. There are degenerative changes of the lower lumbar
spine. AP and lateral views of the left hip reveal minimal
asymmetric joint space loss. There is no acute or healing fracture.
There is no lytic or blastic bony lesion.
IMPRESSION: Possible left inguinal hernia containing gas-filled bowel loops.

No acute bony abnormality of the left hip. Minimal asymmetric joint
space loss consistent with early osteoarthritic change.

## 2017-08-08 MED ORDER — MELOXICAM 7.5 MG PO TABS: 7.5000 mg | ORAL_TABLET | Freq: Every day | ORAL | 0 refills | Status: AC

## 2017-08-08 NOTE — ED Triage Notes (Signed)
Pt reports left hip pain a little over a week now. Denies injuries. Pt ambulatory, states hurts worse when she lays.

## 2017-08-08 NOTE — ED Triage Notes (Signed)
Pt reports has had issues with the same hip in the past.

## 2017-08-08 NOTE — ED Provider Notes (Signed)
[facility] Emergency Department Provider Note  ____________________________________________   None    (approximate)  I have reviewed the triage vital signs and the nursing notes.   HISTORY  Chief Complaint Hip Pain   HPI Miranda Ryan is a 72 y.o. female is here with complaint of left hip pain for more than a week.  Patient denies any injury.  She states she has had problems with her left hip in the past.  She states that area is worse after she is laid down for some time and initially gets up to start walking.  She has been taking Tylenol frequently without any relief of her pain.  Currently she rates her pain as a 9/10.   Past Medical History:  Diagnosis Date  . Acute exacerbation of chronic obstructive pulmonary disease (COPD) ([place]) 04/20/2016  . Anxiety   . Bipolar disorder ([place])   . Depression   . Diabetes mellitus, type II [facility])     Patient Active Problem List   Diagnosis Date Noted  . Acute exacerbation of chronic obstructive pulmonary disease (COPD) ([place]) 04/20/2016    Past Surgical History:  Procedure Laterality Date  . CERVIX SURGERY      Prior to Admission medications   Medication Sig Start Date End Date Taking? Authorizing Provider  atorvastatin (LIPITOR) 40 MG tablet Take 40 mg by mouth at bedtime.    [provider]  busPIRone (BUSPAR) 10 MG tablet Take 1 tablet (10 mg total) by mouth 2 (two) times daily. 04/11/17   Rainey Pines, MD  meloxicam (MOBIC) 7.5 MG tablet Take 1 tablet (7.5 mg total) by mouth daily. 08/08/17 08/08/18  Johnn Hai, PA-C    Allergies Demerol [meperidine]  Family History  Problem Relation Age of Onset  . Liver cancer Mother   . Heart attack Father   . Heart disease Sister   . Heart disease Brother   . Heart disease Brother   . Heart disease Brother     Social History Social History   Tobacco Use  . Smoking status: Current Every Day Smoker    Packs/day: 1.00    Years: 46.00   Pack years: 46.00    Types: Cigarettes    Start date: 09/16/1964  . Smokeless tobacco: Never Used  Substance Use Topics  . Alcohol use: No    Alcohol/week: 0.0 oz  . Drug use: No    Review of Systems Constitutional: No fever/chills Cardiovascular: Denies chest pain. Respiratory: Denies shortness of breath. Gastrointestinal:   No nausea, no vomiting.  No abdominal pain. Genitourinary: Negative for dysuria. Musculoskeletal: Positive for left hip pain. Skin: Negative for rash. Neurological: Negative for headaches, focal weakness or numbness. ____________________________________________   PHYSICAL EXAM:  VITAL SIGNS: ED Triage Vitals  Enc Vitals Group     BP 08/08/17 0727 102/67     Pulse Rate 08/08/17 0727 82     Resp 08/08/17 0727 17     Temp 08/08/17 0727 98.4 F (36.9 C)     Temp Source 08/08/17 0727 Oral     SpO2 08/08/17 0727 99 %     Weight 08/08/17 0721 140 lb (63.5 kg)     Height 08/08/17 0721 5' (1.524 m)     Head Circumference --      Peak Flow --      Pain Score 08/08/17 0721 9     Pain Loc --      Pain Edu? --      Excl. in  [place]? --    Constitutional: Alert and oriented. Well appearing and in no acute distress. Eyes: Conjunctivae are normal.  Head: Atraumatic. Neck: No stridor.   Cardiovascular: Normal rate, regular rhythm. Grossly normal heart sounds.  Good peripheral circulation. Respiratory: Normal respiratory effort.  No retractions. Lungs CTAB. Gastrointestinal: Soft and nontender. No distention. Musculoskeletal: On examination of the left hip there is no gross deformity and range of motion passively is without restriction.  There is some point tenderness on palpation posteriorly.  No soft tissue swelling or discoloration is noted.  Patient is ambulatory without any assistance. Neurologic:  Normal speech and language. No gross focal neurologic deficits are appreciated. No gait instability. Skin:  Skin is warm, dry and intact. No rash noted. Psychiatric:  Mood and affect are normal. Speech and behavior are normal.  ____________________________________________   LABS (all labs ordered are listed, but only abnormal results are displayed)  Labs Reviewed - No data to display RADIOLOGY  ED MD interpretation:  Left hip x-ray is negative for fracture.  Official radiology report(s): Dg Hip Unilat W Or Wo Pelvis 2-3 Views Left  Result Date: 08/08/2017 CLINICAL DATA:  Left hip pain for the past week with no known injury. Pain is greatest when low lying down. EXAM: DG HIP (WITH OR WITHOUT PELVIS) 2-3V LEFT COMPARISON:  None in PACs FINDINGS: There is bowel gas overlying the lateral aspect of the bony pelvis that may lie within an abdominal hernia. The bony pelvis is subjectively adequately mineralized for age. There is no acute or healing fracture. There are degenerative changes of the lower lumbar spine. AP and lateral views of the left hip reveal minimal asymmetric joint space loss. There is no acute or healing fracture. There is no lytic or blastic bony lesion. IMPRESSION: Possible left inguinal hernia containing gas-filled bowel loops. No acute bony abnormality of the left hip. Minimal asymmetric joint space loss consistent with early osteoarthritic change. Electronically Signed   By: David  Martinique M.D.   On: 08/08/2017 08:35    ____________________________________________   PROCEDURES  Procedure(s) performed: None  Procedures  Critical Care performed: No  ____________________________________________   INITIAL IMPRESSION / ASSESSMENT AND PLAN / ED COURSE  As part of my medical decision making, I reviewed the following data within the electronic MEDICAL RECORD NUMBER Notes from prior ED visits and [place] Controlled Substance Database  Patient is here with complaint of left hip pain without history of injury.  She is reassured that x-ray shows osteoarthritis of her left hip.  On x-ray also a left inguinal hernia was noted.  Patient is unaware of  this.  There is absolutely no tenderness or pain in this area.  Patient is to follow-up with her PCP in 2 weeks for reevaluation.  She was discharged with meloxicam 7.5 mg once daily with food.  ____________________________________________   FINAL CLINICAL IMPRESSION(S) / ED DIAGNOSES  Final diagnoses:  Osteoarthritis of left hip, unspecified osteoarthritis type  Left inguinal hernia     ED Discharge Orders        Ordered    meloxicam (MOBIC) 7.5 MG tablet  Daily     08/08/17 0903       Note:  This document was prepared using Dragon voice recognition software and may include unintentional dictation errors.    Johnn Hai, PA-C 08/08/17 1152    Schaevitz, Randall An, MD 08/08/17 [phone]

## 2017-08-08 NOTE — ED Notes (Signed)
See triage note  Presents with pain to left hip for several days   Denies any injury  Ambulates well to treatment room

## 2017-08-08 NOTE — Discharge Instructions (Signed)
Make an appointment to follow-up with your primary care provider in 2 weeks.  Begin taking meloxicam 7.5 mg 1 daily with food.  Take Tylenol only twice a day.  You may also use heat or ice to your hip as needed.  Talk to your doctor about your inguinal hernia.

## 2018-06-28 ENCOUNTER — Other Ambulatory Visit: Payer: Self-pay | Admitting: Psychiatry

## 2018-06-28 DIAGNOSIS — F319 Bipolar disorder, unspecified: Secondary | ICD-10-CM

## 2018-09-19 ENCOUNTER — Other Ambulatory Visit: Payer: Self-pay

## 2018-09-19 DIAGNOSIS — Z20822 Contact with and (suspected) exposure to covid-19: Secondary | ICD-10-CM

## 2018-09-21 LAB — NOVEL CORONAVIRUS, NAA: SARS-CoV-2, NAA: NOT DETECTED

## 2018-09-26 ENCOUNTER — Telehealth: Payer: Self-pay | Admitting: *Deleted

## 2018-09-26 NOTE — Telephone Encounter (Signed)
Patient returned call for results- patient notified negative for COVID. Patient tested for exposure - no Symptoms. Advised reach out to PCP for symptoms/changes and retest.

## 2018-11-14 ENCOUNTER — Other Ambulatory Visit: Payer: Self-pay

## 2018-11-15 ENCOUNTER — Encounter: Payer: Self-pay | Admitting: Family Medicine

## 2018-11-15 ENCOUNTER — Ambulatory Visit: Payer: Medicare Other | Admitting: Family Medicine

## 2018-11-15 ENCOUNTER — Ambulatory Visit: Payer: Medicare Other

## 2018-11-15 ENCOUNTER — Ambulatory Visit (INDEPENDENT_AMBULATORY_CARE_PROVIDER_SITE_OTHER): Payer: Medicare Other | Admitting: Family Medicine

## 2018-11-15 DIAGNOSIS — F319 Bipolar disorder, unspecified: Secondary | ICD-10-CM

## 2018-11-15 DIAGNOSIS — R4189 Other symptoms and signs involving cognitive functions and awareness: Secondary | ICD-10-CM

## 2018-11-15 DIAGNOSIS — Z1231 Encounter for screening mammogram for malignant neoplasm of breast: Secondary | ICD-10-CM | POA: Diagnosis not present

## 2018-11-15 DIAGNOSIS — E559 Vitamin D deficiency, unspecified: Secondary | ICD-10-CM

## 2018-11-15 DIAGNOSIS — E782 Mixed hyperlipidemia: Secondary | ICD-10-CM

## 2018-11-15 DIAGNOSIS — R29818 Other symptoms and signs involving the nervous system: Secondary | ICD-10-CM

## 2018-11-15 DIAGNOSIS — F0390 Unspecified dementia without behavioral disturbance: Secondary | ICD-10-CM

## 2018-11-15 NOTE — Progress Notes (Signed)
Patient ID: Miranda Ryan, female   DOB: 01-13-1946, 73 y.o.   MRN: [id number]    Virtual Visit via video Note  This visit type was conducted due to national recommendations for restrictions regarding the COVID-19 pandemic (e.g. social distancing).  This format is felt to be most appropriate for this patient at this time.  All issues noted in this document were discussed and addressed.  No physical exam was performed (except for noted visual exam findings with Video Visits).   I connected with Loletha Carrow & daughter today at  9:20 AM EDT by a video enabled telemedicine application and verified that I am speaking with the correct person using two identifiers. Location patient: home Location provider: work or home office Persons participating in the virtual visit: patient, provider  I discussed the limitations, risks, security and privacy concerns of performing an evaluation and management service by video and the availability of in person appointments. I also discussed with the patient that there may be a patient responsible charge related to this service. The patient expressed understanding and agreed to proceed.   HPI:  Patient, daughter and I connected via video for patient to establish with PCP.  Currently patient takes no medications.  Daughter does report history of bipolar disorder and dementia.  Daughter is not sure if sometimes her forgetfulness is more related to some dementia or the bipolar.  She was seeing psychiatry regularly, but has not seen recently in part due to COVID-19.  Patient also has a history of high cholesterol, was on atorvastatin but stopped taking.  Patient has not had blood work recently.  Patient also has a history of some urinary and stool incontinence.  This is related to a vaginal tear from childbirth that had multiple reconstructive surgeries.  Patient uses a depends pad/diaper and this works for her.  Patient feels she is eating and drinking well.  Tries to  keep up good water intake and eat various fruits and vegetables/meats in her diet.  Patient will be getting flu vaccine today.  She has never had colonoscopy and would prefer not to have one.  Is agreeable to get mammogram.   ROS:   Constitutional: Negative for chills, fatigue and fever.  HENT: Negative for congestion, ear pain, sinus pain and sore throat.   Eyes: Negative.   Respiratory: Negative for cough, shortness of breath and wheezing.   Cardiovascular: Negative for chest pain, palpitations and leg swelling.  Gastrointestinal: Negative for abdominal pain, diarrhea, nausea and vomiting.  Genitourinary: Negative for dysuria, frequency and urgency.  Musculoskeletal: Negative for arthralgias and myalgias.  Skin: Negative for color change, pallor and rash.  Neurological: Negative for syncope, light-headedness and headaches.  Psychiatric/Behavioral: The patient is not nervous/anxious.    Past medical, social, surgical and family history reviewed and updated accordingly in chart.  Past Medical History:  Diagnosis Date  . Acute exacerbation of chronic obstructive pulmonary disease (COPD) ([place]) 04/20/2016  . Anxiety   . Bipolar disorder ([place])   . Dementia ([place])   . Depression   . Diabetes mellitus, type II ([place])   . Incontinence of urine     Past Surgical History:  Procedure Laterality Date  . CERVIX SURGERY      Family History  Problem Relation Age of Onset  . Liver cancer Mother   . Heart attack Father   . Heart disease Sister   . Heart disease Brother   . Heart disease Brother   . Heart disease Brother  Social History   Tobacco Use  . Smoking status: Current Every Day Smoker    Packs/day: 1.00    Years: 46.00    Pack years: 46.00    Types: Cigarettes    Start date: 09/16/1964  . Smokeless tobacco: Never Used  Substance Use Topics  . Alcohol use: Yes    Alcohol/week: 0.0 standard drinks    Comment: occasionally     EXAM:  GENERAL: alert, oriented,  appears well and in no acute distress  HEENT: atraumatic, conjunttiva clear, no obvious abnormalities on inspection of external nose and ears  NECK: normal movements of the head and neck  LUNGS: on inspection no signs of respiratory distress, breathing rate appears normal, no obvious gross SOB, gasping or wheezing  CV: no obvious cyanosis  MS: moves all visible extremities without noticeable abnormality  PSYCH/NEURO: pleasant and cooperative, no obvious depression or anxiety, speech and thought processing grossly intact  ASSESSMENT AND PLAN:  Discussed the following assessment and plan:  We will get patient scheduled for new blood work to assess for any anemia, vitamin or electrolyte deficiencies, monitor liver kidney functions and look at cholesterol levels.  I will put in a referral to psychiatry to get patient set back up with psychiatrist for management of bipolar.  It is possible that bipolar could be affecting the cognition and vice versa.  We will also rule out any other vitamin deficiencies in lab work.  1. Bipolar 1 disorder ([place])  - Ambulatory referral to Psychiatry - CBC w/Diff; Future - Comp Met (CMET); Future - Thyroid Panel With TSH; Future - B12 and Folate Panel; Future - VITAMIN D 25 Hydroxy (Vit-D Deficiency, Fractures); Future  2. Neurocognitive deficits  - Ambulatory referral to Psychiatry - CBC w/Diff; Future - Comp Met (CMET); Future - Thyroid Panel With TSH; Future - B12 and Folate Panel; Future - VITAMIN D 25 Hydroxy (Vit-D Deficiency, Fractures); Future  3. Breast cancer screening by mammogram  - MM Digital Screening; Future  4. Mixed hyperlipidemia  - Lipid panel; Future  5. Unspecified dementia without behavioral disturbance (HCC)   - B12 and Folate Panel; Future - VITAMIN D 25 Hydroxy (Vit-D Deficiency, Fractures); Future   I discussed the assessment and treatment plan with the patient. The patient was provided an opportunity to ask  questions and all were answered. The patient agreed with the plan and demonstrated an understanding of the instructions.   The patient was advised to call back or seek an in-person evaluation if the symptoms worsen or if the condition fails to improve as anticipated.  Jodelle Green, FNP

## 2018-11-24 ENCOUNTER — Encounter: Payer: Self-pay | Admitting: *Deleted

## 2019-01-30 ENCOUNTER — Emergency Department: Payer: Medicare Other

## 2019-01-30 ENCOUNTER — Emergency Department
Admission: EM | Admit: 2019-01-30 | Discharge: 2019-01-30 | Disposition: A | Discharge status: Home / Self Care | Payer: Medicare Other | Attending: Student in an Organized Health Care Education/Training Program | Admitting: Student in an Organized Health Care Education/Training Program

## 2019-01-30 ENCOUNTER — Other Ambulatory Visit: Payer: Self-pay

## 2019-01-30 DIAGNOSIS — F0390 Unspecified dementia without behavioral disturbance: Secondary | ICD-10-CM | POA: Insufficient documentation

## 2019-01-30 DIAGNOSIS — F17210 Nicotine dependence, cigarettes, uncomplicated: Secondary | ICD-10-CM | POA: Insufficient documentation

## 2019-01-30 DIAGNOSIS — R4182 Altered mental status, unspecified: Secondary | ICD-10-CM | POA: Diagnosis present

## 2019-01-30 DIAGNOSIS — J449 Chronic obstructive pulmonary disease, unspecified: Secondary | ICD-10-CM | POA: Diagnosis not present

## 2019-01-30 DIAGNOSIS — R410 Disorientation, unspecified: Secondary | ICD-10-CM | POA: Diagnosis not present

## 2019-01-30 DIAGNOSIS — E119 Type 2 diabetes mellitus without complications: Secondary | ICD-10-CM | POA: Diagnosis not present

## 2019-01-30 DIAGNOSIS — Z20828 Contact with and (suspected) exposure to other viral communicable diseases: Secondary | ICD-10-CM | POA: Diagnosis not present

## 2019-01-30 LAB — COMPREHENSIVE METABOLIC PANEL
ALT: 18 U/L (ref 0–44)
AST: 16 U/L (ref 15–41)
Albumin: 3.4 g/dL — ABNORMAL LOW (ref 3.5–5.0)
Alkaline Phosphatase: 104 U/L (ref 38–126)
Anion gap: 13 (ref 5–15)
BUN: 46 mg/dL — ABNORMAL HIGH (ref 8–23)
CO2: 22 mmol/L (ref 22–32)
Calcium: 8.9 mg/dL (ref 8.9–10.3)
Chloride: 104 mmol/L (ref 98–111)
Creatinine, Ser: 1.78 mg/dL — ABNORMAL HIGH (ref 0.44–1.00)
GFR calc Af Amer: 32 mL/min — ABNORMAL LOW (ref >60)
GFR calc non Af Amer: 28 mL/min — ABNORMAL LOW (ref >60)
Glucose, Bld: 165 mg/dL — ABNORMAL HIGH (ref 70–99)
Potassium: 4.4 mmol/L (ref 3.5–5.1)
Sodium: 139 mmol/L (ref 135–145)
Total Bilirubin: 0.5 mg/dL (ref 0.3–1.2)
Total Protein: 8.1 g/dL (ref 6.5–8.1)

## 2019-01-30 LAB — URINALYSIS, COMPLETE (UACMP) WITH MICROSCOPIC
Bacteria, UA: NONE SEEN
Bilirubin Urine: NEGATIVE
Glucose, UA: NEGATIVE mg/dL
Hgb urine dipstick: NEGATIVE
Ketones, ur: NEGATIVE mg/dL
Leukocytes,Ua: NEGATIVE
Nitrite: NEGATIVE
Protein, ur: 100 mg/dL — AB
Specific Gravity, Urine: 1.02 (ref 1.005–1.030)
Squamous Epithelial / LPF: NONE SEEN (ref 0–5)
pH: 5 (ref 5.0–8.0)

## 2019-01-30 LAB — CBC
HCT: 41.5 % (ref 36.0–46.0)
Hemoglobin: 13 g/dL (ref 12.0–15.0)
MCH: 27.1 pg (ref 26.0–34.0)
MCHC: 31.3 g/dL (ref 30.0–36.0)
MCV: 86.6 fL (ref 80.0–100.0)
Platelets: 262 10*3/uL (ref 150–400)
RBC: 4.79 MIL/uL (ref 3.87–5.11)
RDW: 13.7 % (ref 11.5–15.5)
WBC: 12 10*3/uL — ABNORMAL HIGH (ref 4.0–10.5)
nRBC: 0 % (ref 0.0–0.2)

## 2019-01-30 LAB — POC SARS CORONAVIRUS 2 AG: SARS Coronavirus 2 Ag: NEGATIVE

## 2019-01-30 LAB — TROPONIN I (HIGH SENSITIVITY): Troponin I (High Sensitivity): 14 ng/L (ref <18)

## 2019-01-30 IMAGING — CR DG CHEST 1V PORT
1 series · 1 of 1 positions shown · non-contrast
Comparison: [DATE]

CLINICAL DATA: Patient presents for cough and ams. Patient is non
verbal at this time. Hx of dm, copd, dementia.cough

EXAM:
PORTABLE CHEST 1 VIEW

[chest ap]
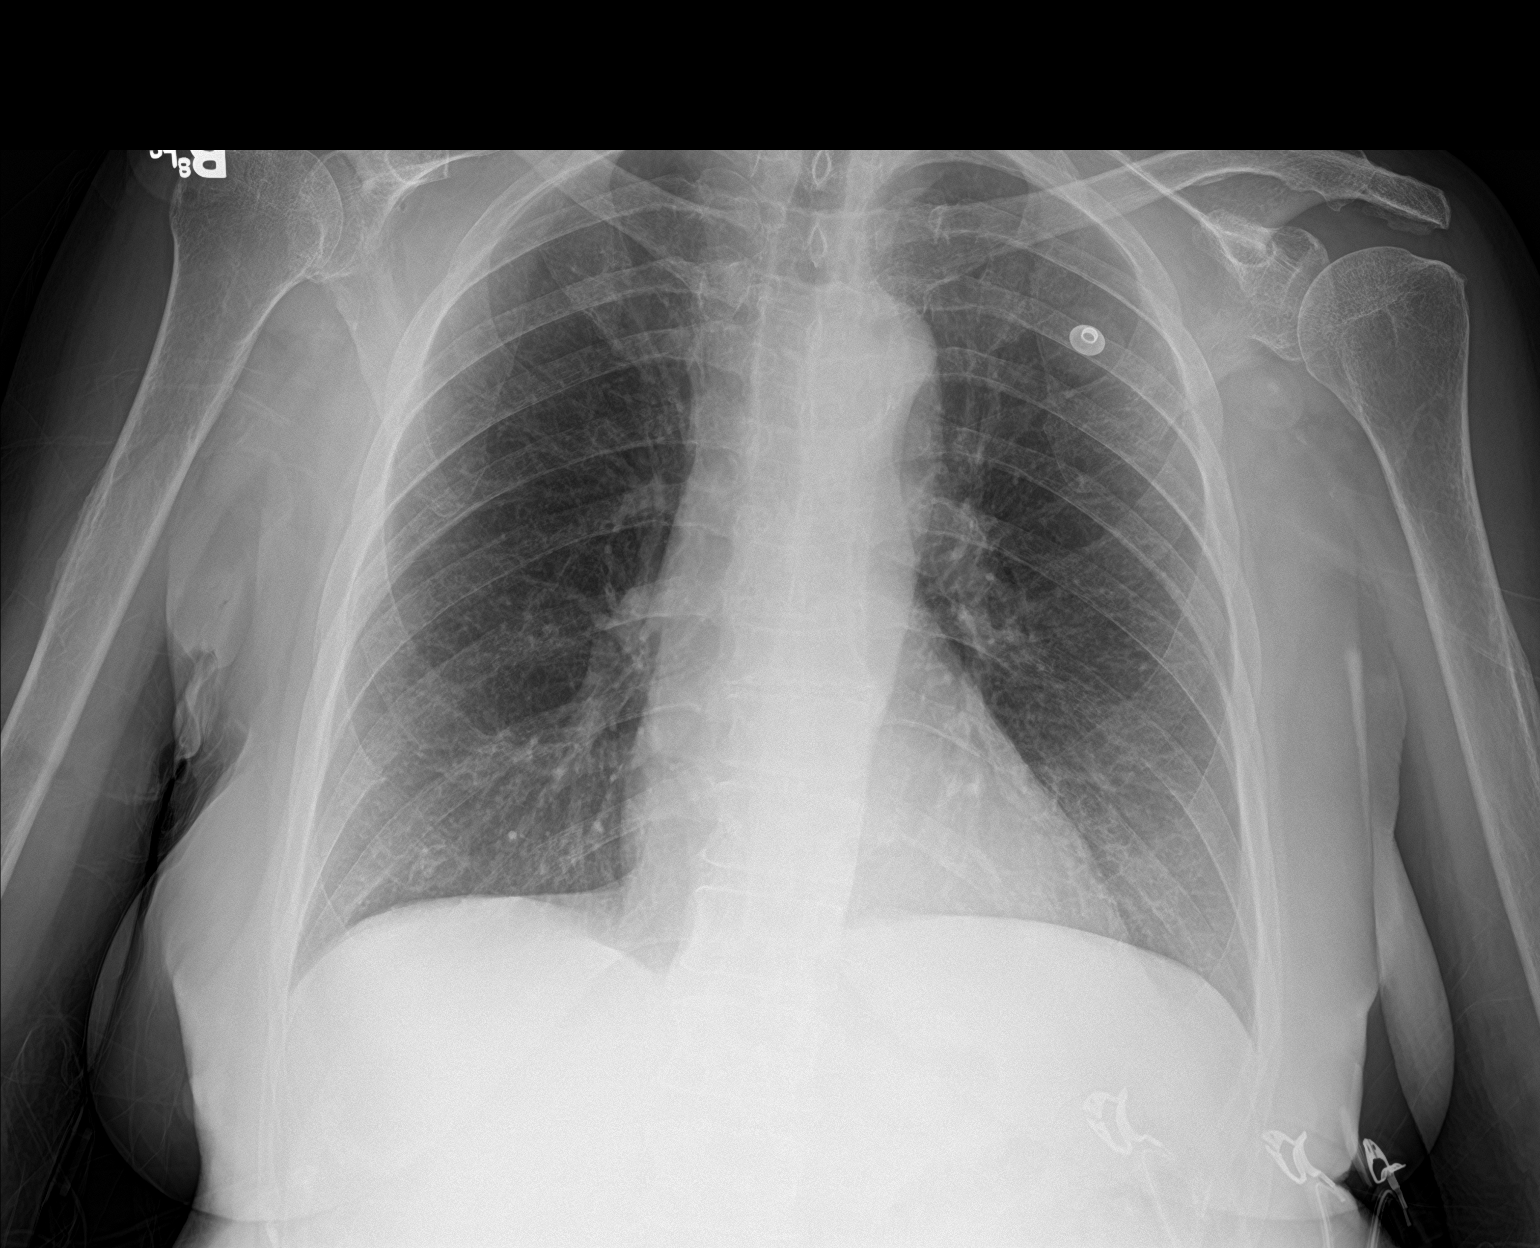

[1 of 1 positions shown; findings below may reference images not displayed]

FINDINGS: The heart size and mediastinal contours are within normal limits.
Both lungs are clear. The visualized skeletal structures are
unremarkable.
IMPRESSION: No active disease.

## 2019-01-30 MED ORDER — SODIUM CHLORIDE 0.9% FLUSH: 3.0000 mL | Freq: Once | INTRAVENOUS | Status: DC

## 2019-01-30 NOTE — ED Provider Notes (Signed)
[facility] Emergency Department Provider Note  Time seen: 2:48 PM  I have reviewed the triage vital signs and the nursing notes.   HISTORY  Chief Complaint Altered Mental Status   HPI Miranda Ryan is a 73 y.o. female with a past medical history anxiety, bipolar, dementia, diabetes, urinary incontinence, presents to the emergency department for altered mental status.  According to the daughter patient has mild dementia and is bipolar however today around 11 AM shortly after eating breakfast the patient did not want to smoke a cigarette which is abnormal, daughter states she gave her an ice cream and she did not eat that which is also abnormal.  States about an hour later around 12 patient had an episode of incontinence and seem to be less responsive they called EMS and brought the patient to the emergency department.  However currently the daughter states the patient is acting normal.  Patient is awake and alert answering questions appropriately and following all commands, denies any complaints at this time.   Past Medical History:  Diagnosis Date  . Acute exacerbation of chronic obstructive pulmonary disease (COPD) ([place]) 04/20/2016  . Anxiety   . Bipolar disorder ([place])   . Dementia ([place])   . Depression   . Diabetes mellitus, type II ([place])   . Incontinence of urine     Patient Active Problem List   Diagnosis Date Noted  . Acute exacerbation of chronic obstructive pulmonary disease (COPD) ([place]) 04/20/2016    Past Surgical History:  Procedure Laterality Date  . CERVIX SURGERY      Prior to Admission medications   Not on File    Allergies  Allergen Reactions  . Demerol [Meperidine] Other (See Comments)    Pt states she "turned green"     Family History  Problem Relation Age of Onset  . Liver cancer Mother   . Heart attack Father   . Heart disease Sister   . Heart disease Brother   . Heart disease Brother   . Heart disease Brother     Social  History Social History   Tobacco Use  . Smoking status: Current Every Day Smoker    Packs/day: 1.00    Years: 46.00    Pack years: 46.00    Types: Cigarettes    Start date: 09/16/1964  . Smokeless tobacco: Never Used  Substance Use Topics  . Alcohol use: Not Currently    Alcohol/week: 0.0 standard drinks    Comment: occasionally  . Drug use: No    Review of Systems Constitutional: Negative for fever. Cardiovascular: Negative for chest pain. Respiratory: Negative for shortness of breath.  Occasional cough. Gastrointestinal: Negative for abdominal pain Genitourinary: Denies dysuria.  Episode of incontinence today. Musculoskeletal: Negative for musculoskeletal complaints Neurological: Negative for headache All other ROS negative, possibly limited by dementia  ____________________________________________   PHYSICAL EXAM:  VITAL SIGNS: ED Triage Vitals  Enc Vitals Group     BP 01/30/19 1300 119/77     Pulse Rate 01/30/19 1300 (!) 103     Resp 01/30/19 1300 17     Temp 01/30/19 1300 97.6 F (36.4 C)     Temp Source 01/30/19 1300 Oral     SpO2 01/30/19 1300 97 %     Weight 01/30/19 1257 135 lb (61.2 kg)     Height 01/30/19 1257 5\' 1"  (1.549 m)     Head Circumference --      Peak Flow --      Pain  Score 01/30/19 1257 0     Pain Loc --      Pain Edu? --      Excl. in [place]? --    Constitutional: Alert and oriented. Well appearing and in no distress. Eyes: Normal exam ENT      Head: Normocephalic and atraumatic.      Mouth/Throat: Mucous membranes are moist. Cardiovascular: Normal rate, regular rhythm. Respiratory: Normal respiratory effort without tachypnea nor retractions. Breath sounds are clear Gastrointestinal: Soft and nontender. No distention.  Musculoskeletal: Nontender with normal range of motion in all extremities. Neurologic:  Normal speech and language. No gross focal neurologic deficits Skin:  Skin is warm, dry and intact.  Psychiatric: Mood and affect  are normal. ____________________________________________    EKG  EKG viewed and interpreted by myself shows sinus tachycardia 105 bpm with a narrow QRS, normal axis, normal intervals, nonspecific ST changes. ____________________________________________    RADIOLOGY  Chest x-ray and CT head pending  ____________________________________________   INITIAL IMPRESSION / ASSESSMENT AND PLAN / ED COURSE  Pertinent labs & imaging results that were available during my care of the patient were reviewed by me and considered in my medical decision making (see chart for details).   Patient presents to the emergency department for confusion.  Her daughter had an episode of confusion starting at 11:00 this morning which has since resolved.  Currently the patient is awake alert no distress.  We will check labs, CT scan of the head, chest x-ray and a Covid swab.  Overall the patient appears well at this time has no complaints is answering questions and following commands.  Patient care signed out to oncoming physician.  [name] was evaluated in Emergency Department on 01/30/2019 for the symptoms described in the history of present illness. She was evaluated in the context of the global COVID-19 pandemic, which necessitated consideration that the patient might be at risk for infection with the SARS-CoV-2 virus that causes COVID-19. Institutional protocols and algorithms that pertain to the evaluation of patients at risk for COVID-19 are in a state of rapid change based on information released by regulatory bodies including the CDC and federal and state organizations. These policies and algorithms were followed during the patient's care in the ED.  ____________________________________________   FINAL CLINICAL IMPRESSION(S) / ED DIAGNOSES  Altered mental status/confusion   Harvest Dark, MD 01/30/19 1453

## 2019-01-30 NOTE — ED Notes (Signed)
Pt transported to CT ?

## 2019-01-30 NOTE — ED Provider Notes (Addendum)
Patient received in signout from Dr. Kerman Passey pending results of labs as well as imaging after episode of confusion today.  Patient now well denies any symptoms.  Blood work is reassuring.  Does have borderline elevated white count but no fever or other markers of SIRS at this time.  Repeat exam is reassuring.  At this point I do believe she is clinically stable for outpatient follow-up, particularly in the setting of resource limited environment 2/2 recent increase in covid 19 cases.   Merlyn Lot, MD 01/30/19 1715    Merlyn Lot, MD 01/30/19 [phone]

## 2019-01-30 NOTE — ED Triage Notes (Addendum)
Pt comes into the ED via EMS from home with c/o per EMS, that family that pt is confused today. States normally a/ox4. cbg 183, states she has a slight droop to the right that is her baseline.  After reviewing the pt chart, it noted that the pt does have a hx of dementia.

## 2019-01-30 NOTE — ED Notes (Signed)
Pt daughter went home, and ask that we update her on pt. If discharged, daughter will be picking up pt. Daughter number is in demographics.

## 2019-02-21 ENCOUNTER — Emergency Department: Payer: Medicare Other

## 2019-02-21 ENCOUNTER — Other Ambulatory Visit: Payer: Self-pay

## 2019-02-21 ENCOUNTER — Inpatient Hospital Stay
Admission: EM | Admit: 2019-02-21 | Discharge: 2019-02-27 | DRG: 065 | Disposition: A | Discharge status: Skilled Nursing Facility | Payer: Medicare Other | Attending: Family Medicine | Admitting: Family Medicine

## 2019-02-21 ENCOUNTER — Observation Stay: Payer: Medicare Other

## 2019-02-21 DIAGNOSIS — Z8249 Family history of ischemic heart disease and other diseases of the circulatory system: Secondary | ICD-10-CM

## 2019-02-21 DIAGNOSIS — I050 Rheumatic mitral stenosis: Secondary | ICD-10-CM

## 2019-02-21 DIAGNOSIS — D631 Anemia in chronic kidney disease: Secondary | ICD-10-CM | POA: Diagnosis present

## 2019-02-21 DIAGNOSIS — I639 Cerebral infarction, unspecified: Secondary | ICD-10-CM | POA: Diagnosis not present

## 2019-02-21 DIAGNOSIS — R531 Weakness: Secondary | ICD-10-CM

## 2019-02-21 DIAGNOSIS — I129 Hypertensive chronic kidney disease with stage 1 through stage 4 chronic kidney disease, or unspecified chronic kidney disease: Secondary | ICD-10-CM | POA: Diagnosis present

## 2019-02-21 DIAGNOSIS — R3989 Other symptoms and signs involving the genitourinary system: Secondary | ICD-10-CM

## 2019-02-21 DIAGNOSIS — R262 Difficulty in walking, not elsewhere classified: Secondary | ICD-10-CM | POA: Diagnosis present

## 2019-02-21 DIAGNOSIS — Z79899 Other long term (current) drug therapy: Secondary | ICD-10-CM

## 2019-02-21 DIAGNOSIS — R32 Unspecified urinary incontinence: Secondary | ICD-10-CM | POA: Diagnosis present

## 2019-02-21 DIAGNOSIS — F419 Anxiety disorder, unspecified: Secondary | ICD-10-CM | POA: Diagnosis present

## 2019-02-21 DIAGNOSIS — G9340 Encephalopathy, unspecified: Secondary | ICD-10-CM | POA: Diagnosis present

## 2019-02-21 DIAGNOSIS — F319 Bipolar disorder, unspecified: Secondary | ICD-10-CM | POA: Diagnosis present

## 2019-02-21 DIAGNOSIS — E1122 Type 2 diabetes mellitus with diabetic chronic kidney disease: Secondary | ICD-10-CM | POA: Diagnosis present

## 2019-02-21 DIAGNOSIS — R7989 Other specified abnormal findings of blood chemistry: Secondary | ICD-10-CM

## 2019-02-21 DIAGNOSIS — R1310 Dysphagia, unspecified: Secondary | ICD-10-CM

## 2019-02-21 DIAGNOSIS — N390 Urinary tract infection, site not specified: Secondary | ICD-10-CM | POA: Diagnosis present

## 2019-02-21 DIAGNOSIS — F0390 Unspecified dementia without behavioral disturbance: Secondary | ICD-10-CM | POA: Diagnosis present

## 2019-02-21 DIAGNOSIS — R29810 Facial weakness: Secondary | ICD-10-CM | POA: Diagnosis present

## 2019-02-21 DIAGNOSIS — N1832 Chronic kidney disease, stage 3b: Secondary | ICD-10-CM | POA: Diagnosis present

## 2019-02-21 DIAGNOSIS — R29703 NIHSS score 3: Secondary | ICD-10-CM | POA: Diagnosis present

## 2019-02-21 DIAGNOSIS — R627 Adult failure to thrive: Secondary | ICD-10-CM | POA: Diagnosis present

## 2019-02-21 DIAGNOSIS — F17210 Nicotine dependence, cigarettes, uncomplicated: Secondary | ICD-10-CM | POA: Diagnosis present

## 2019-02-21 DIAGNOSIS — Z800 Family history of malignant neoplasm of digestive organs: Secondary | ICD-10-CM

## 2019-02-21 DIAGNOSIS — Z20822 Contact with and (suspected) exposure to covid-19: Secondary | ICD-10-CM | POA: Diagnosis present

## 2019-02-21 LAB — URINE DRUG SCREEN, QUALITATIVE (ARMC ONLY)
Amphetamines, Ur Screen: NOT DETECTED
Barbiturates, Ur Screen: NOT DETECTED
Benzodiazepine, Ur Scrn: NOT DETECTED
Cannabinoid 50 Ng, Ur ~~LOC~~: NOT DETECTED
Cocaine Metabolite,Ur ~~LOC~~: NOT DETECTED
MDMA (Ecstasy)Ur Screen: NOT DETECTED
Methadone Scn, Ur: NOT DETECTED
Opiate, Ur Screen: NOT DETECTED
Phencyclidine (PCP) Ur S: NOT DETECTED
Tricyclic, Ur Screen: NOT DETECTED

## 2019-02-21 LAB — URINALYSIS, COMPLETE (UACMP) WITH MICROSCOPIC
Bilirubin Urine: NEGATIVE
Glucose, UA: NEGATIVE mg/dL
Ketones, ur: NEGATIVE mg/dL
Nitrite: POSITIVE — AB
Protein, ur: 100 mg/dL — AB
Specific Gravity, Urine: 1.018 (ref 1.005–1.030)
Squamous Epithelial / HPF: NONE SEEN (ref 0–5)
WBC, UA: >50 WBC/hpf (ref 0–5)
pH: 6 (ref 5.0–8.0)

## 2019-02-21 LAB — COMPREHENSIVE METABOLIC PANEL
ALT: 25 U/L (ref 0–44)
AST: 24 U/L (ref 15–41)
Albumin: 2.5 g/dL — ABNORMAL LOW (ref 3.5–5.0)
Alkaline Phosphatase: 130 U/L — ABNORMAL HIGH (ref 38–126)
Anion gap: 14 (ref 5–15)
BUN: 38 mg/dL — ABNORMAL HIGH (ref 8–23)
CO2: 23 mmol/L (ref 22–32)
Calcium: 9.3 mg/dL (ref 8.9–10.3)
Chloride: 98 mmol/L (ref 98–111)
Creatinine, Ser: 1.46 mg/dL — ABNORMAL HIGH (ref 0.44–1.00)
GFR calc Af Amer: 41 mL/min — ABNORMAL LOW (ref >60)
GFR calc non Af Amer: 35 mL/min — ABNORMAL LOW (ref >60)
Glucose, Bld: 276 mg/dL — ABNORMAL HIGH (ref 70–99)
Potassium: 4.2 mmol/L (ref 3.5–5.1)
Sodium: 135 mmol/L (ref 135–145)
Total Bilirubin: 0.9 mg/dL (ref 0.3–1.2)
Total Protein: 7.4 g/dL (ref 6.5–8.1)

## 2019-02-21 LAB — RESPIRATORY PANEL BY RT PCR (FLU A&B, COVID)
Influenza A by PCR: NEGATIVE
Influenza B by PCR: NEGATIVE
SARS Coronavirus 2 by RT PCR: NEGATIVE

## 2019-02-21 LAB — CREATININE, SERUM
Creatinine, Ser: 1.31 mg/dL — ABNORMAL HIGH (ref 0.44–1.00)
GFR calc Af Amer: 46 mL/min — ABNORMAL LOW (ref >60)
GFR calc non Af Amer: 40 mL/min — ABNORMAL LOW (ref >60)

## 2019-02-21 LAB — CBC
HCT: 34.9 % — ABNORMAL LOW (ref 36.0–46.0)
Hemoglobin: 11.3 g/dL — ABNORMAL LOW (ref 12.0–15.0)
MCH: 26.7 pg (ref 26.0–34.0)
MCHC: 32.4 g/dL (ref 30.0–36.0)
MCV: 82.3 fL (ref 80.0–100.0)
Platelets: 293 10*3/uL (ref 150–400)
RBC: 4.24 MIL/uL (ref 3.87–5.11)
RDW: 13.4 % (ref 11.5–15.5)
WBC: 17 10*3/uL — ABNORMAL HIGH (ref 4.0–10.5)
nRBC: 0 % (ref 0.0–0.2)

## 2019-02-21 LAB — ETHANOL: Alcohol, Ethyl (B): <10 mg/dL (ref <10)

## 2019-02-21 IMAGING — MR MR HEAD W/O CM
10 series · 48 of 48 positions shown · non-contrast
Comparison: None.

CLINICAL DATA: Encephalopathy, worsening weakness

EXAM:
MRI HEAD WITHOUT CONTRAST
TECHNIQUE: Multiplanar, multiecho pulse sequences of the brain and surrounding
structures were obtained without intravenous contrast.

[Series 5: ax dwi_tracew · axial · 3.0mm · 1.31mm/px · z∈[-125,+20]mm · 6 of 47 slices shown]
[im 1/47]
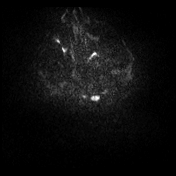
[im 10/47]
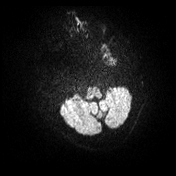
[im 19/47]
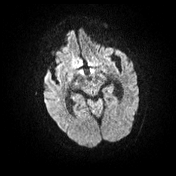
[im 28/47]
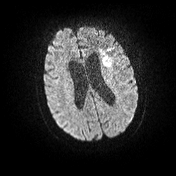
[im 37/47]
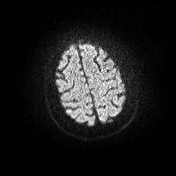
[im 47/47]
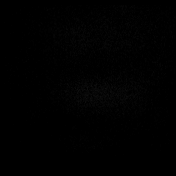

[Series 6: ax dwi_adc · axial · 3.0mm · 1.31mm/px · z∈[-125,+17]mm · 7 of 45 slices shown]
[im 1/45]
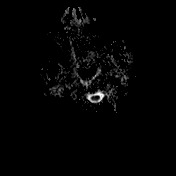
[im 8/45]
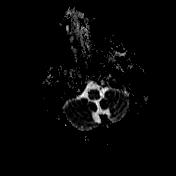
[im 15/45]
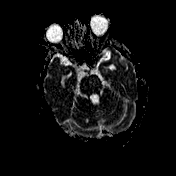
[im 23/45]
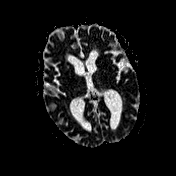
[im 30/45]
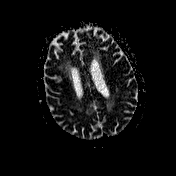
[im 37/45]
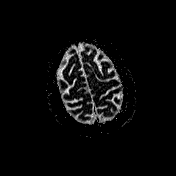
[im 45/45]
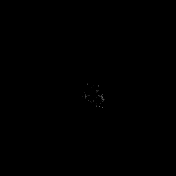

[Series 7: cor dwi_tracew · coronal · 5.0mm · 1.31mm/px · 6 of 38 slices shown]
[im 1/38]
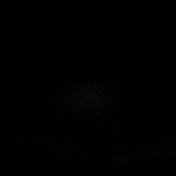
[im 8/38]
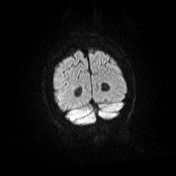
[im 15/38]
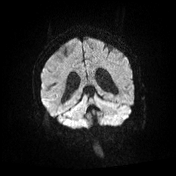
[im 23/38]
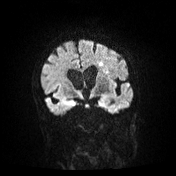
[im 30/38]
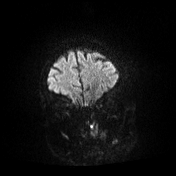
[im 38/38]
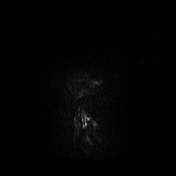

[Series 8: cor dwi_adc · coronal · 5.0mm · 1.31mm/px · 5 of 37 slices shown]
[im 1/37]
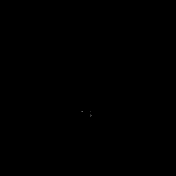
[im 10/37]
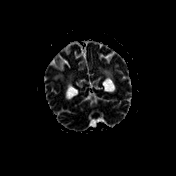
[im 19/37]
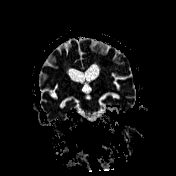
[im 28/37]
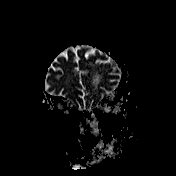
[im 37/37]
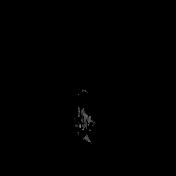

[Series 9: FLAIR · axial · 5.0mm · 1.20mm/px · z∈[-127,+28]mm · 4 of 27 slices shown]
[im 1/27]
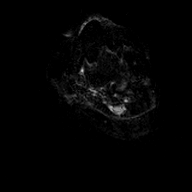
[im 9/27]
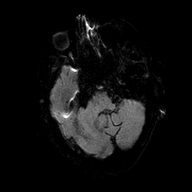
[im 18/27]
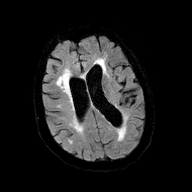
[im 27/27]
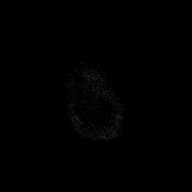

[Series 10: T2 · axial · 5.0mm · 0.45mm/px · z∈[-127,+28]mm · 4 of 27 slices shown (1 of 2)]
[im 1/27]
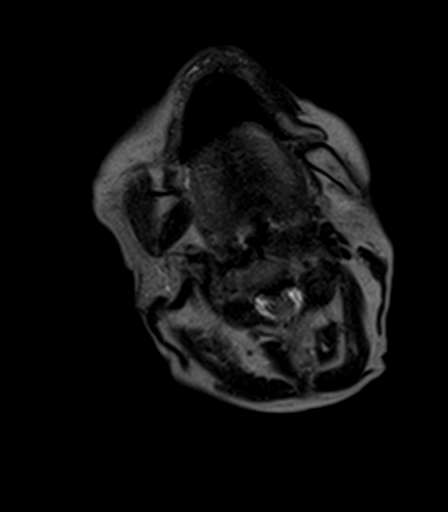
[im 9/27]
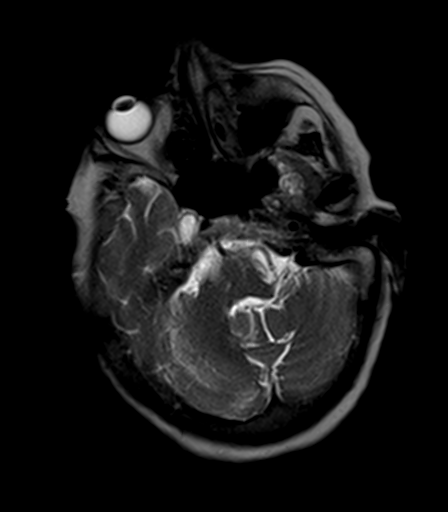
[im 18/27]
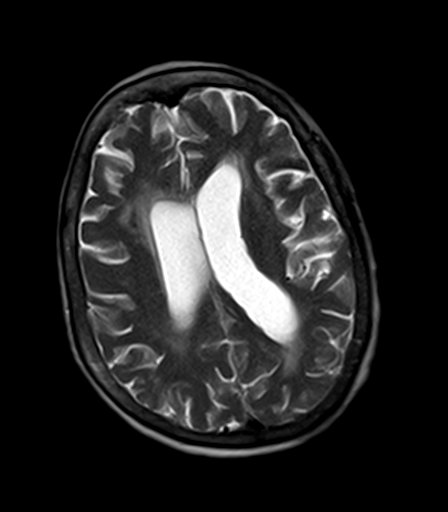
[im 27/27]
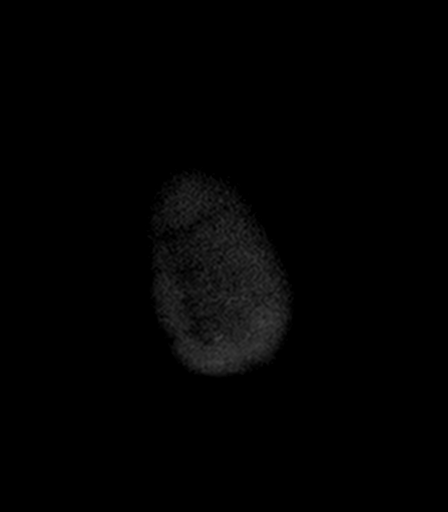

[Series 11: T2-star · axial · 5.0mm · 0.45mm/px · z∈[-127,+28]mm · 4 of 27 slices shown]
[im 1/27]
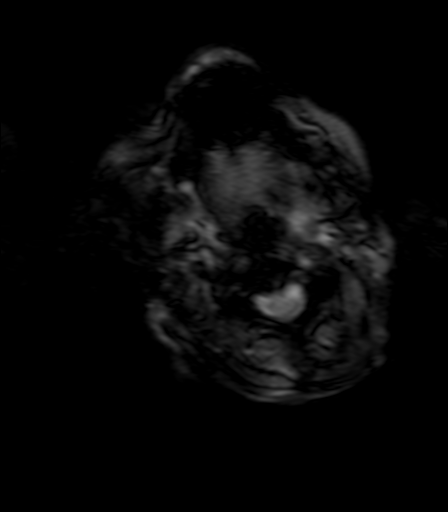
[im 9/27]
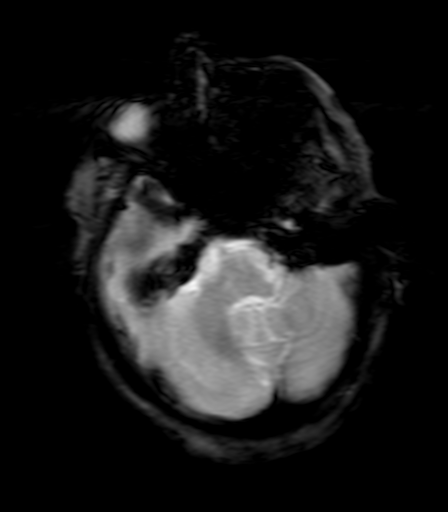
[im 18/27]
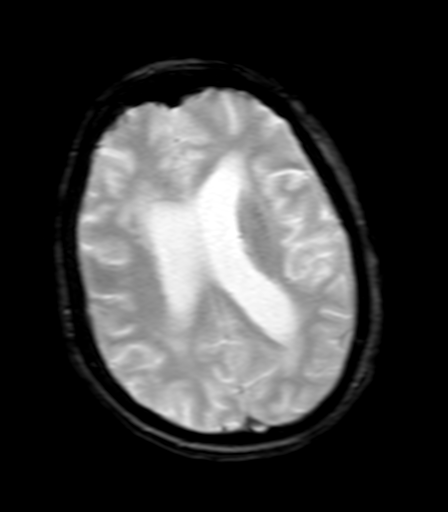
[im 27/27]
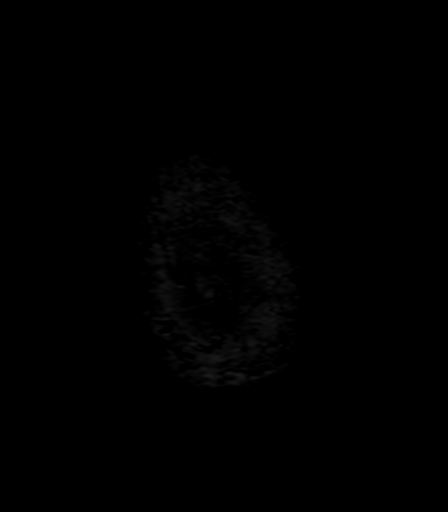

[Series 12: T1 · axial · 5.0mm · 0.90mm/px · z∈[-151,-2]mm · 4 of 27 slices shown (1 of 2)]
[im 1/27]
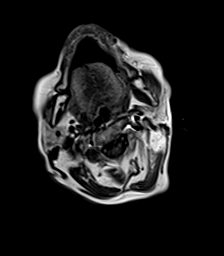
[im 9/27]
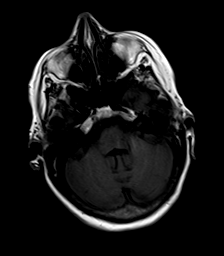
[im 18/27]
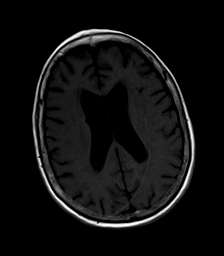
[im 27/27]
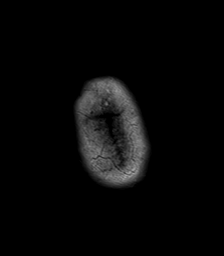

[Series 13: T1 · sagittal · 5.0mm · 0.94mm/px · 3 of 23 slices shown (2 of 2)]
[im 1/23]
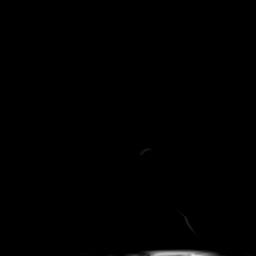
[im 12/23]
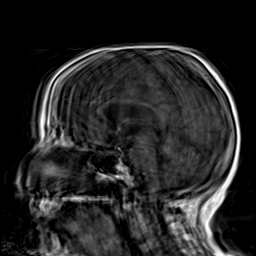
[im 23/23]
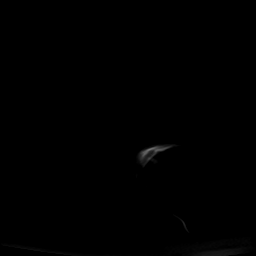

[Series 14: T2 · coronal · 5.0mm · 0.45mm/px · 5 of 31 slices shown (2 of 2)]
[im 1/31]
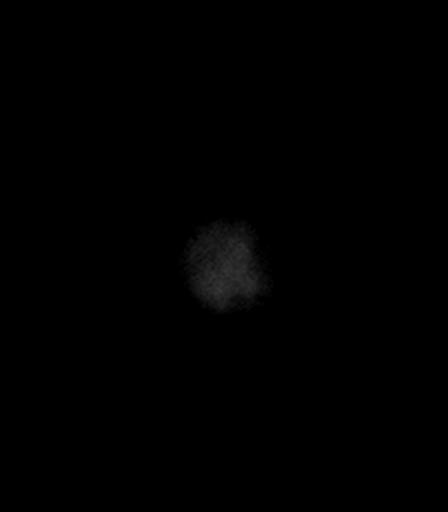
[im 8/31]
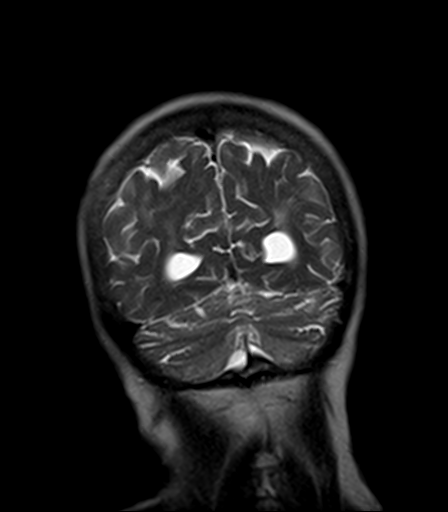
[im 16/31]
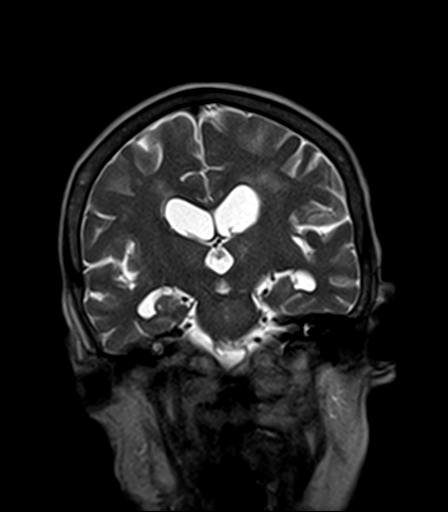
[im 23/31]
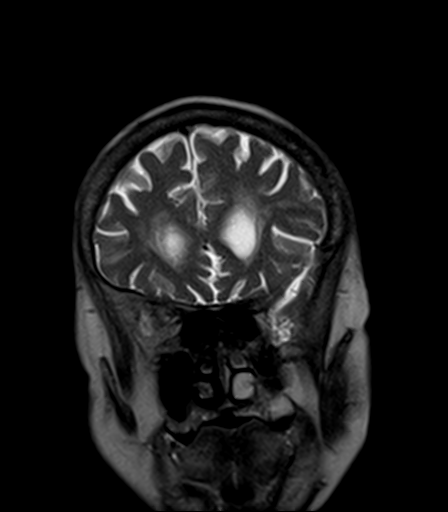
[im 31/31]
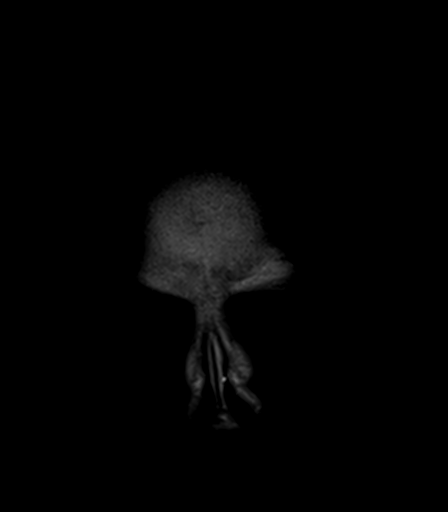

[48 of 48 positions shown; findings below may reference images not displayed]

FINDINGS: Brain: There is patchy reduced diffusion in the frontal lobe
involving the corona radiata.

There is no acute infarction or intracranial hemorrhage. There is no
intracranial mass, mass effect, or edema. There is no hydrocephalus
or extra-axial fluid collection. Prominence of the ventricles and
sulci reflects mild generalized parenchymal volume loss. Relative
ventricular prominence is likely on an ex vacuo basis. Patchy and
confluent T2 hyperintensity in the supratentorial and pontine white
matter is nonspecific but probably reflects moderate to marked
chronic microvascular ischemic changes. Left anterior temporal
encephalomalacia.

Vascular: Major vessel flow voids at the skull base are preserved.

Skull and upper cervical spine: Normal marrow signal is preserved.

Sinuses/Orbits: Paranasal sinuses are aerated. Orbits are
unremarkable.

Other: Sella is unremarkable.  Mastoid air cells are clear.
IMPRESSION: Small acute infarcts of the deep left cerebral white matter. May
reflect small vessel or watershed type infarcts.

Moderate to marked chronic microvascular ischemic changes. Left
anterior temporal encephalomalacia.

## 2019-02-21 IMAGING — CR DG CHEST 2V
1 series · 2 of 2 positions shown · non-contrast
Comparison: [DATE].  [DATE] [DATE], [DATE].

CLINICAL DATA: Weakness

EXAM:
CHEST - 2 VIEW

[Series 1: dg chest 2 view · 0.14mm/px · 2 of 2 slices shown]
[im 1/2]
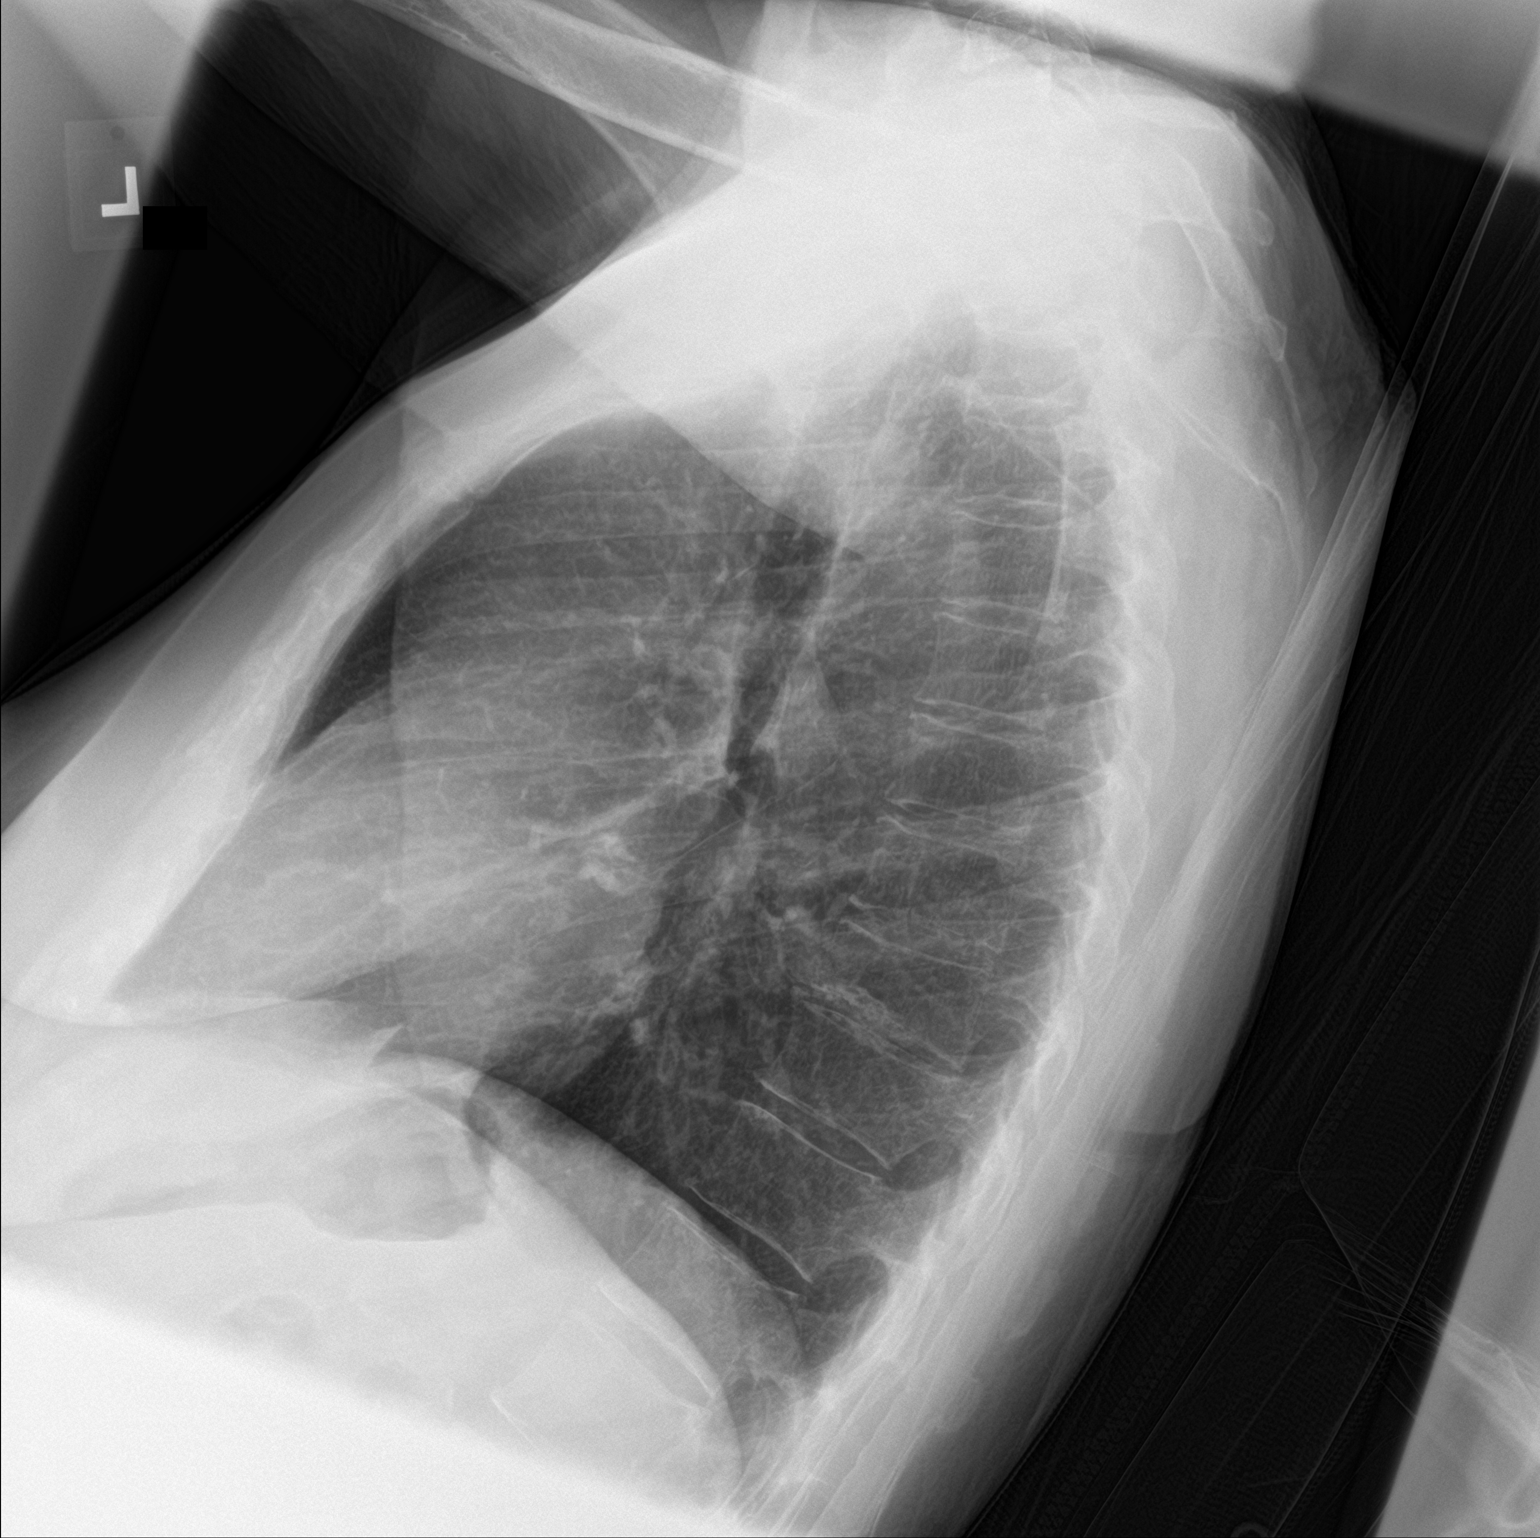
[im 2/2]
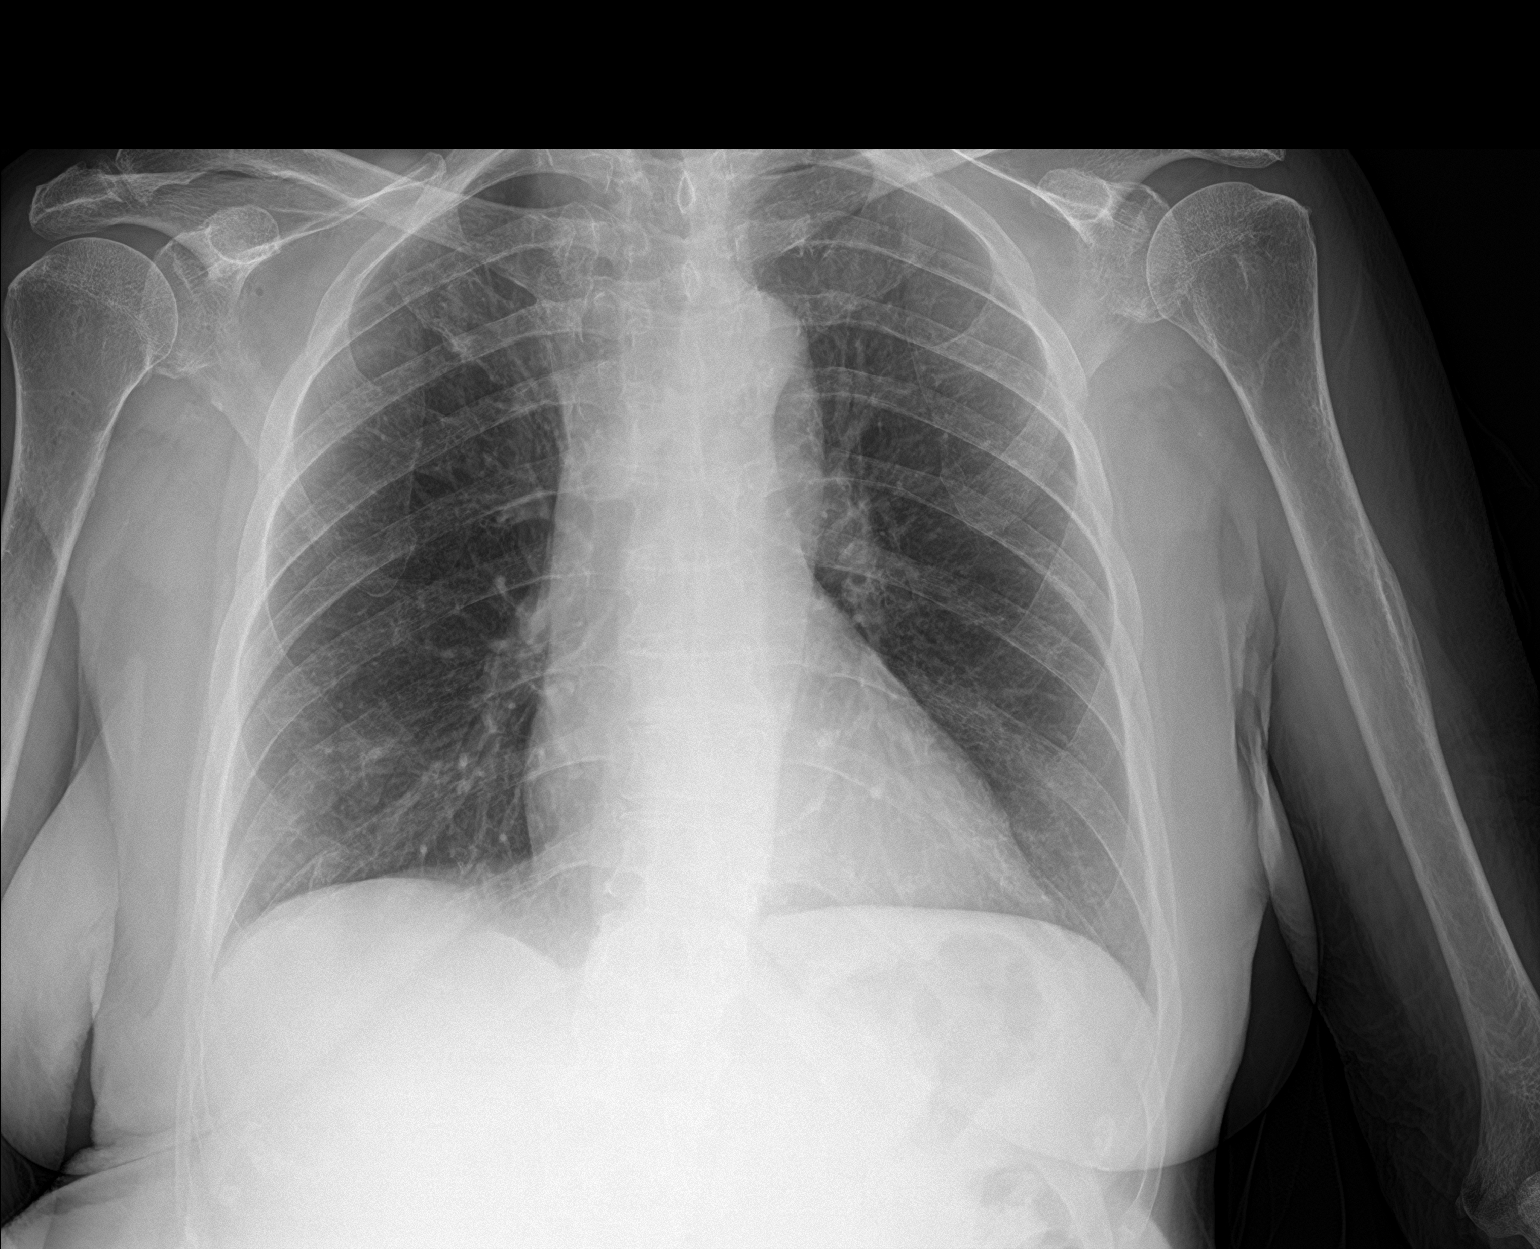

[2 of 2 positions shown; findings below may reference images not displayed]

FINDINGS: The heart size and mediastinal contours are within normal limits.
Both lungs are clear. The visualized skeletal structures are
unremarkable.
IMPRESSION: No active cardiopulmonary disease.

## 2019-02-21 IMAGING — US US CAROTID DUPLEX BILAT
1 series · 13 of 24 positions shown · non-contrast
Comparison: None.

CLINICAL DATA: 73-year-old female with history of ischemic stroke

EXAM:
BILATERAL CAROTID DUPLEX ULTRASOUND
TECHNIQUE: Gray scale imaging, color Doppler and duplex ultrasound were
performed of bilateral carotid and vertebral arteries in the neck.

[Series 1: us carotid duplex bilat · 13 of 71 slices shown]
[im 1/71]
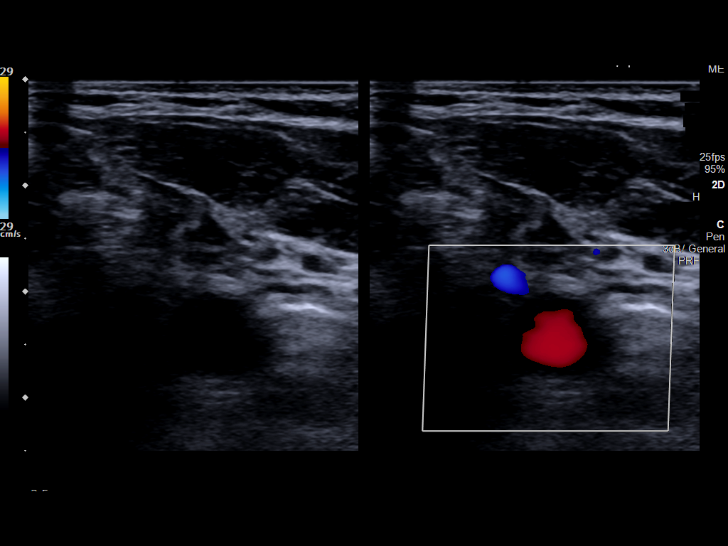
[im 7/71]
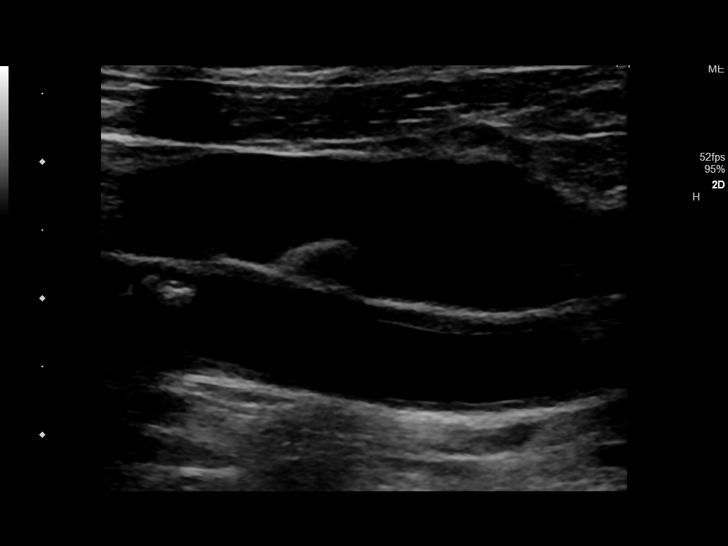
[im 13/71]
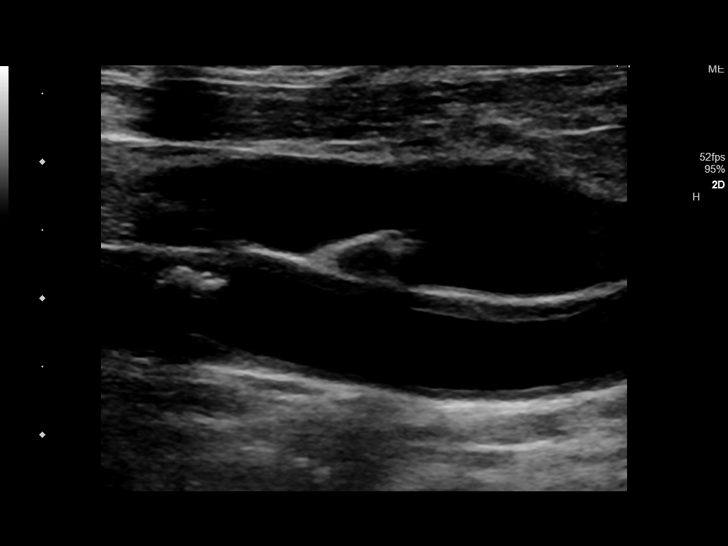
[im 19/71]
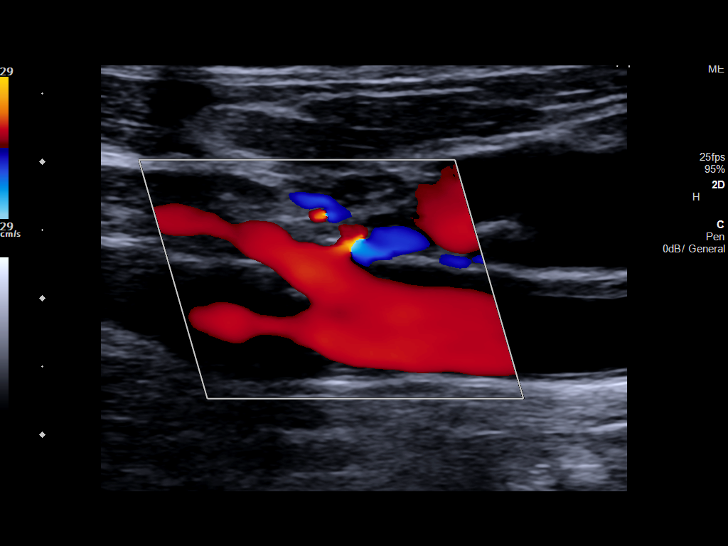
[im 25/71]
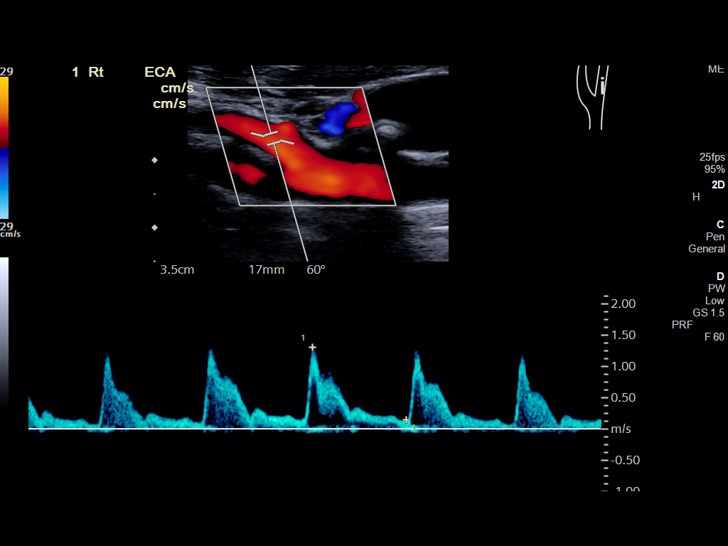
[im 31/71]
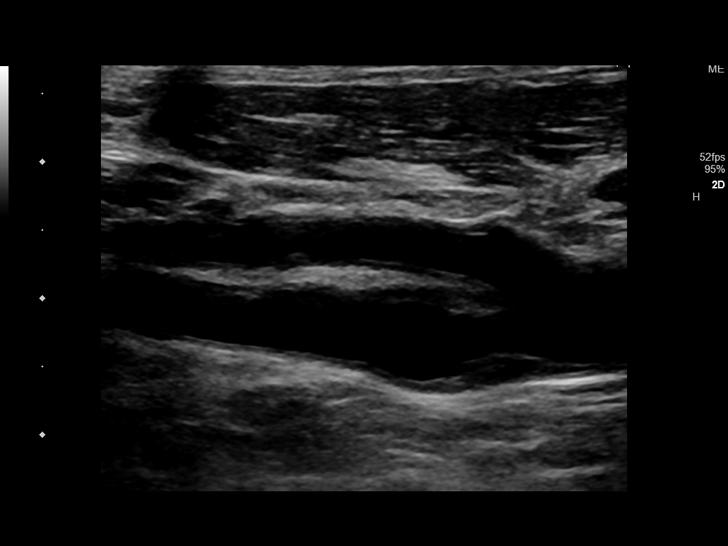
[im 37/71]
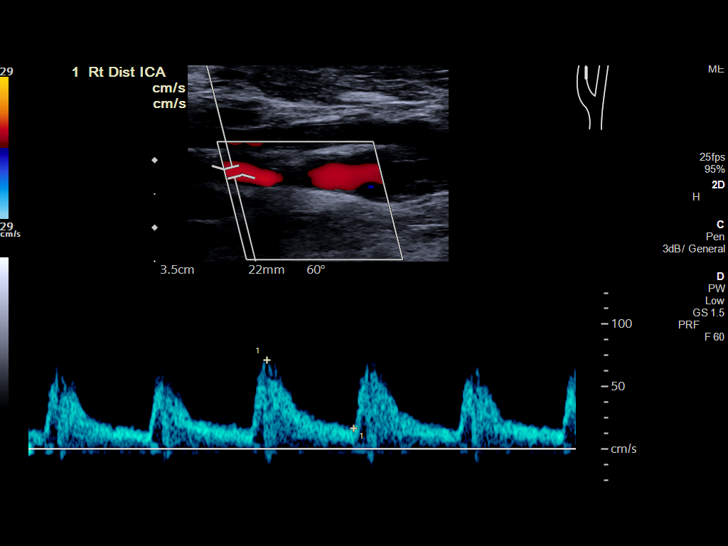
[im 40/71]
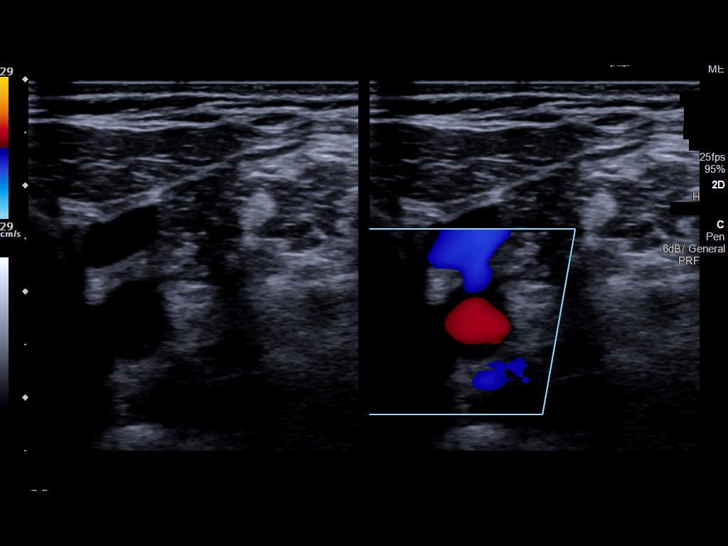
[im 46/71]
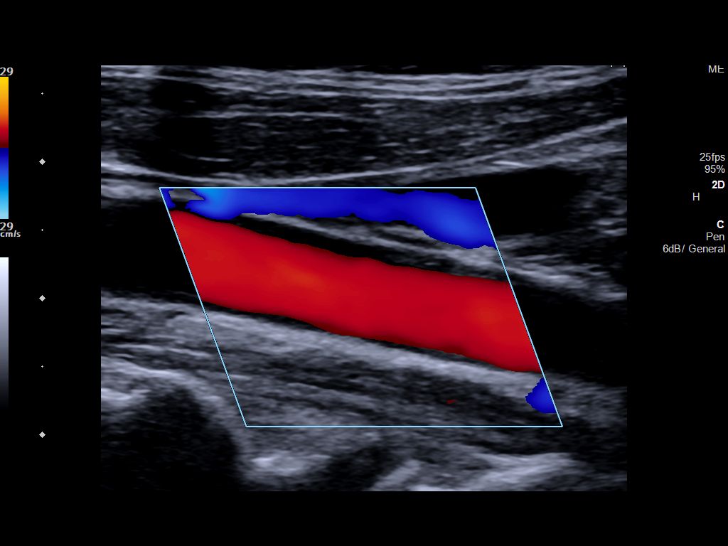
[im 52/71]
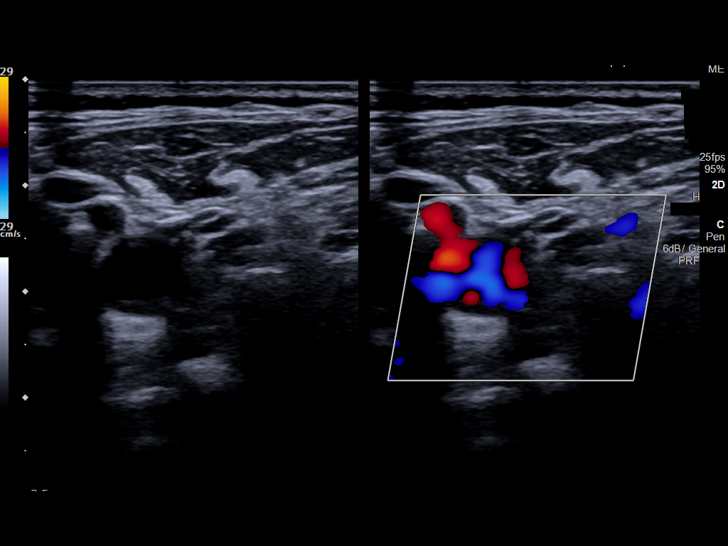
[im 58/71]
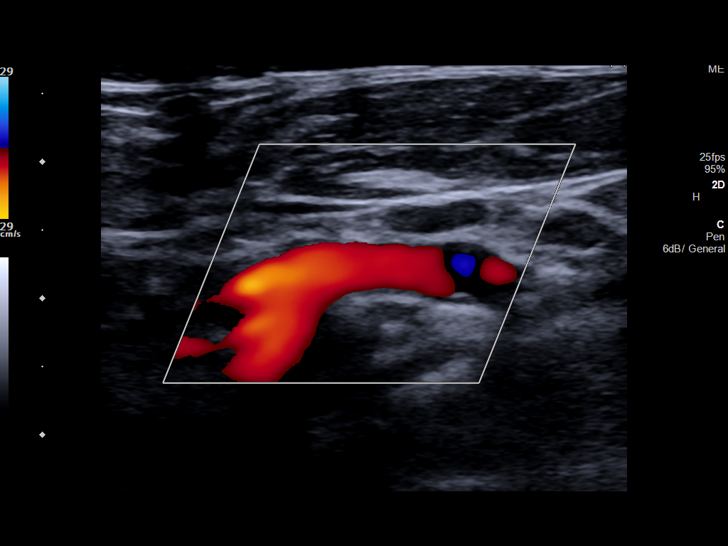
[im 64/71]
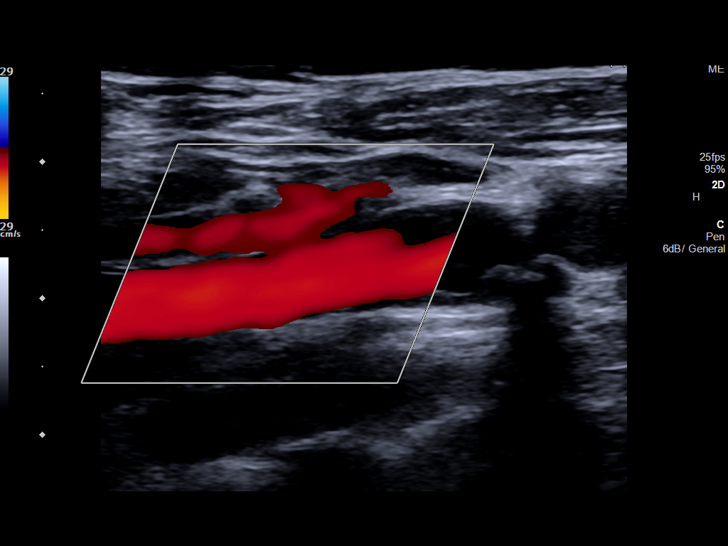
[im 71/71]
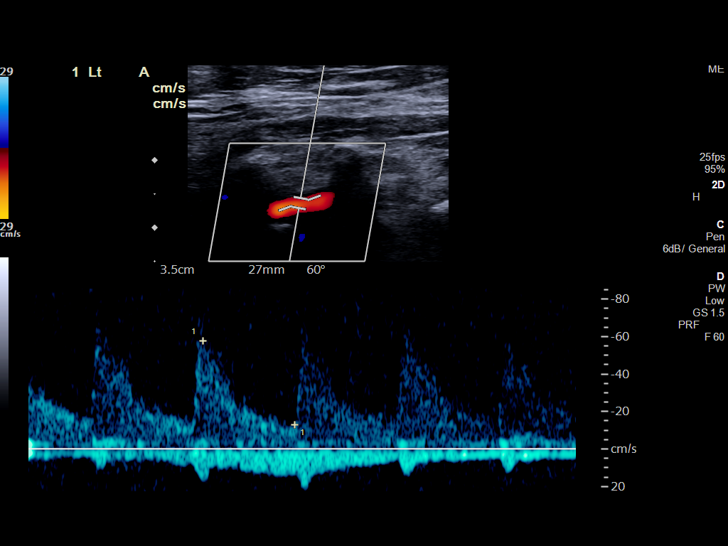

[13 of 24 positions shown; findings below may reference images not displayed]

FINDINGS: Criteria: Quantification of carotid stenosis is based on velocity
parameters that correlate the residual internal carotid diameter
with NASCET-based stenosis levels, using the diameter of the distal
internal carotid lumen as the denominator for stenosis measurement.

The following velocity measurements were obtained:

RIGHT

ICA:  Systolic 112 cm/sec, Diastolic 17 cm/sec

CCA:  89 cm/sec

SYSTOLIC ICA/CCA RATIO:

ECA:  131 cm/sec

LEFT

ICA:  Systolic 103 cm/sec, Diastolic 16 cm/sec

CCA:  85 cm/sec

SYSTOLIC ICA/CCA RATIO:

ECA:  124 cm/sec

Right Brachial SBP: Not acquired

Left Brachial SBP: Not acquired

RIGHT CAROTID ARTERY: No significant calcifications of the right
common carotid artery. Intermediate waveform maintained.
Heterogeneous and partially calcified plaque at the right carotid
bifurcation. No significant lumen shadowing. Low resistance waveform
of the right ICA. No significant tortuosity.

RIGHT VERTEBRAL ARTERY: Antegrade flow with low resistance waveform.

LEFT CAROTID ARTERY: No significant calcifications of the left
common carotid artery. Intermediate waveform maintained.
Heterogeneous and partially calcified plaque at the left carotid
bifurcation without significant lumen shadowing. Low resistance
waveform of the left ICA. No significant tortuosity.

LEFT VERTEBRAL ARTERY:  Antegrade flow with low resistance waveform.
IMPRESSION: Color duplex indicates moderate heterogeneous and calcified plaque,
with no hemodynamically significant stenosis by duplex criteria in
the extracranial cerebrovascular circulation.

## 2019-02-21 MED ORDER — ACETAMINOPHEN 160 MG/5ML PO SOLN
650.0000 mg | ORAL | Status: DC | PRN
  Filled 2019-02-21: qty 20.3

## 2019-02-21 MED ORDER — ACETAMINOPHEN 325 MG PO TABS: 650.0000 mg | ORAL_TABLET | ORAL | Status: DC | PRN

## 2019-02-21 MED ORDER — STROKE: EARLY STAGES OF RECOVERY BOOK: Freq: Once | Status: AC

## 2019-02-21 MED ORDER — SODIUM CHLORIDE 0.9 % IV BOLUS
1000.0000 mL | Freq: Once | INTRAVENOUS | Status: AC
  Administered 2019-02-21: 1000 mL via INTRAVENOUS

## 2019-02-21 MED ORDER — ASPIRIN EC 81 MG PO TBEC
81.0000 mg | DELAYED_RELEASE_TABLET | Freq: Every day | ORAL | Status: DC
  Administered 2019-02-22 – 2019-02-27 (×6): 81 mg via ORAL
  Filled 2019-02-21 (×6): qty 1

## 2019-02-21 MED ORDER — ACETAMINOPHEN 650 MG RE SUPP: 650.0000 mg | RECTAL | Status: DC | PRN

## 2019-02-21 MED ORDER — SENNOSIDES-DOCUSATE SODIUM 8.6-50 MG PO TABS: 1.0000 | ORAL_TABLET | Freq: Every evening | ORAL | Status: DC | PRN

## 2019-02-21 MED ORDER — SODIUM CHLORIDE 0.9 % IV SOLN: INTRAVENOUS | Status: DC

## 2019-02-21 MED ORDER — ASPIRIN 325 MG PO TABS
325.0000 mg | ORAL_TABLET | Freq: Once | ORAL | Status: AC
  Administered 2019-02-21: 325 mg via ORAL
  Filled 2019-02-21: qty 1

## 2019-02-21 MED ORDER — SODIUM CHLORIDE 0.9 % IV SOLN
1.0000 g | INTRAVENOUS | Status: DC
  Administered 2019-02-21 – 2019-02-22 (×2): 1 g via INTRAVENOUS
  Filled 2019-02-21 (×2): qty 1
  Filled 2019-02-21: qty 10

## 2019-02-21 MED ORDER — ATORVASTATIN CALCIUM 20 MG PO TABS
40.0000 mg | ORAL_TABLET | Freq: Every day | ORAL | Status: DC
  Administered 2019-02-21 – 2019-02-26 (×6): 40 mg via ORAL
  Filled 2019-02-21 (×6): qty 2

## 2019-02-21 MED ORDER — HEPARIN SODIUM (PORCINE) 5000 UNIT/ML IJ SOLN
5000.0000 [IU] | Freq: Three times a day (TID) | INTRAMUSCULAR | Status: DC
  Administered 2019-02-21 – 2019-02-27 (×17): 5000 [IU] via SUBCUTANEOUS
  Filled 2019-02-21 (×17): qty 1

## 2019-02-21 MED ORDER — SODIUM CHLORIDE 0.9% FLUSH: 3.0000 mL | Freq: Once | INTRAVENOUS | Status: DC

## 2019-02-21 NOTE — ED Notes (Signed)
Ice placed on right upper arm due to swelling after IV infiltration. NS was fluids running at time of infiltration.

## 2019-02-21 NOTE — ED Notes (Signed)
Pt in and out catheterized for urine sample. Only 63mL received from in and out but pt had a wet depends and was changed. Urine appear yellow but cloudy upon assessment.

## 2019-02-21 NOTE — H&P (Signed)
History and Physical  Miranda Ryan [id number] DOB: 18-Jan-1946 DOA: 02/21/2019  Referring physician: ED Physician  PCP: [place]  Outpatient Specialists:  1.    Chief Complaint: Weakness   HPI: HPI: Miranda Ryan is a 73 y.o. female with significant past medical history anxiety, bipolar, dementia, diabetes, urinary incontinence, presents to the emergency department for the second time in 10 days with generalized weakness decreased p.o. intake.  Patient was here in the emergency on 01/30/2019 with suspected altered mental status.  According to the daughter, since patient was discharged from the emergency on her last visit patient has not been doing well.  Also per daughter, patient has not been eating as much and was having trouble swallowing especially solid food.  Patient was also showing some sort of facial droop.  Patient was having trouble moving and ambulating as she did before.  Per daughter patient was ambulating and eating independently prior to December 8.  Daughter is concerned and brought patient back EMS.  ED course: Since her last CT of the head found to be negative for any acute findings on 01/30/2019, MRI of the brain was ordered today which showed "Small acute infarcts of the deep left cerebral white matter. May reflect small vessel or watershed type infarcts."  Also found to have leukocytosis with WBC 17 K, creatinine 1.46.  Neurology was not called. Hospitalist service was called for further work-up and management.  Review of Systems: All systems reviewed and apart from history of presenting illness, are negative.  Past Medical History:  Diagnosis Date  . Acute exacerbation of chronic obstructive pulmonary disease (COPD) ([place]) 04/20/2016  . Anxiety   . Bipolar disorder ([place])   . Dementia ([place])   . Depression   . Diabetes mellitus, type II ([place])   . Incontinence of urine    Past Surgical History:  Procedure Laterality Date  . CERVIX SURGERY      Social History:  reports that she has been smoking cigarettes. She started smoking about 54 years ago. She has a 46.00 pack-year smoking history. She has never used smokeless tobacco. She reports previous alcohol use. She reports that she does not use drugs.   Allergies  Allergen Reactions  . Demerol [Meperidine] Other (See Comments)    Pt states she "turned green"     Family History  Problem Relation Age of Onset  . Liver cancer Mother   . Heart attack Father   . Heart disease Sister   . Heart disease Brother   . Heart disease Brother   . Heart disease Brother       Prior to Admission medications   Not on File   Physical Exam: Vitals:   02/21/19 1033 02/21/19 1034  BP: 103/68   Pulse: 66   Resp: 16   Temp: 97.7 F (36.5 C)   TempSrc: Oral   SpO2: 99%   Weight:  65.8 kg  Height:  5\' 1"  (1.549 m)     General exam: Thin built, appears somewhat dry and confused lying flat on bed in no acute distress  Head, eyes and ENT: Nontraumatic and normocephalic. Pupils equally reacting to light and accommodation. Oral mucosa moist.  Neck: Supple. No JVD, carotid bruit or thyromegaly.  Lymphatics: No lymphadenopathy.  Respiratory system: Clear to auscultation. No increased work of breathing.  Cardiovascular system: S1 and S2 heard, RRR. No JVD; + murmurs; No gallops, clicks or pedal edema.  Gastrointestinal system: Abdomen is nondistended, soft and  nontender. Normal bowel sounds heard. No organomegaly or masses appreciated.  Central nervous system: Alert oriented to herself and her daughter.  But mumbling in her word.  Was also having hard time to follow the command.  Strength 2-3 out of 4 on left upper and lower extremity.  3 out of 4 on rest of the extremity.  Sensation appears to be intact.  Reflexes appropriate.  Extremities: Symmetric;Peripheral pulses symmetrically felt.  No edema  Skin: No rashes or acute findings.  Musculoskeletal system: Negative  exam.  Psychiatry: Alert and oriented to person.  Flat affect.   Labs on Admission:  Basic Metabolic Panel: Recent Labs  Lab 02/21/19 1038  NA 135  K 4.2  CL 98  CO2 23  GLUCOSE 276*  BUN 38*  CREATININE 1.46*  CALCIUM 9.3   Liver Function Tests: Recent Labs  Lab 02/21/19 1038  AST 24  ALT 25  ALKPHOS 130*  BILITOT 0.9  PROT 7.4  ALBUMIN 2.5*   No results for input(s): LIPASE, AMYLASE in the last 168 hours. No results for input(s): AMMONIA in the last 168 hours. CBC: Recent Labs  Lab 02/21/19 1038  WBC 17.0*  HGB 11.3*  HCT 34.9*  MCV 82.3  PLT 293   Cardiac Enzymes: No results for input(s): CKTOTAL, CKMB, CKMBINDEX, TROPONINI in the last 168 hours.  BNP (last 3 results) No results for input(s): PROBNP in the last 8760 hours. CBG: No results for input(s): GLUCAP in the last 168 hours.  Radiological Exams on Admission: DG Chest 2 View  Result Date: 02/21/2019 CLINICAL DATA:  Weakness EXAM: CHEST - 2 VIEW COMPARISON:  04/20/2016.  January 30, 2019. FINDINGS: The heart size and mediastinal contours are within normal limits. Both lungs are clear. The visualized skeletal structures are unremarkable. IMPRESSION: No active cardiopulmonary disease. Electronically Signed   By: Constance Holster M.D.   On: 02/21/2019 16:03   MR BRAIN WO CONTRAST  Result Date: 02/21/2019 CLINICAL DATA:  Encephalopathy, worsening weakness EXAM: MRI HEAD WITHOUT CONTRAST TECHNIQUE: Multiplanar, multiecho pulse sequences of the brain and surrounding structures were obtained without intravenous contrast. COMPARISON:  None. FINDINGS: Brain: There is patchy reduced diffusion in the frontal lobe involving the corona radiata. There is no acute infarction or intracranial hemorrhage. There is no intracranial mass, mass effect, or edema. There is no hydrocephalus or extra-axial fluid collection. Prominence of the ventricles and sulci reflects mild generalized parenchymal volume loss. Relative  ventricular prominence is likely on an ex vacuo basis. Patchy and confluent T2 hyperintensity in the supratentorial and pontine white matter is nonspecific but probably reflects moderate to marked chronic microvascular ischemic changes. Left anterior temporal encephalomalacia. Vascular: Major vessel flow voids at the skull base are preserved. Skull and upper cervical spine: Normal marrow signal is preserved. Sinuses/Orbits: Paranasal sinuses are aerated. Orbits are unremarkable. Other: Sella is unremarkable.  Mastoid air cells are clear. IMPRESSION: Small acute infarcts of the deep left cerebral white matter. May reflect small vessel or watershed type infarcts. Moderate to marked chronic microvascular ischemic changes. Left anterior temporal encephalomalacia. Electronically Signed   By: Macy Mis M.D.   On: 02/21/2019 15:55    EKG: Independently reviewed.    Assessment/Plan Active Problems:   Ischemic stroke diagnosed during current admission ([place])  1) generalized weakness and decreased p.o. intake: -Multiple etiologies -Acute stroke versus infection versus metabolic -Somewhat slow and decrease usual activities per daughter -We will check urine drug screening, blood alcohol level -Also check TSH, [id number], folic acid -May  start some IV hydration as patient appears somewhat dry -Consult dietitian -If passes swallow evaluation may resume oral diet -Consult PT and OT for eval and treat and DC recommendation  2) acute ischemic stroke: With focal neurological deficit to the left lower extremity more than left upper extremity -Even though patient or daughter at did not have any specific complaints of focal weakness, MRI of the brain shows "Small acute infarcts of the deep left cerebral white matter. May reflect small vessel or watershed type infarcts." -Patient symptoms are vague and have been present for over a couple of weeks.  Is not a candidate for TPA apart from the fact the patient is outside  the TPA window -We will consult neurology -Troponin first set negative -Order EKG -Check thyroid panel, hemoglobin A1c and fasting lipid profile -We will give loading dose of aspirin 300 mg today followed by 81 mg from tomorrow.  Start high-dose statin -Check 2D echocardiogram, carotid Doppler -EEG overnight -Neuro check, seizure precaution, aspiration precaution -Consult speech therapist for swallow evaluation -PT and OT consultation -Permissive hypertension for a total of 24 to 72 hours -Monitor on telemetry  CKD, stage IIIb: Appears to be stable based on creatinine and GFR for the past year -We will starts hydration -Avoid nephrotoxic agents -Monitor renal function  Dementia: History of dementia -Currently stable -Symptomatic management  Psych disorder: History of bipolar and depression -But patient has not been on any medication -Currently denies any suicidal or homicidal thoughts or ideas and hallucination -Conservative management  DVT Prophylaxis: Heparin Code Status: Full  Family Communication: Daughter Disposition Plan: Per PT OT recommendation  Time spent: 10 minutes  Thornell Mule, MD  [facility] Pager [phone]  If 7PM-7AM, please contact night-coverage [email or web address] Password [email or web address] 02/21/2019, 6:00 PM

## 2019-02-21 NOTE — Progress Notes (Signed)
Patient started on rocephin for urinalysis results indicative of infection with leukocytosis and nitrite presence. Culture is pending

## 2019-02-21 NOTE — ED Triage Notes (Signed)
Pt comes into the ED via EMS from home with c/o pt having increased weakness, pt not wanting to eat well, states the family is not able to care for her and may needs placement [id number], 99%RA, 86/60,

## 2019-02-21 NOTE — ED Notes (Signed)
POA will be coming back due to dementia. [employer] [phone]

## 2019-02-21 NOTE — ED Notes (Signed)
Patient transported to MRI 

## 2019-02-21 NOTE — ED Provider Notes (Signed)
[facility] Emergency Department Provider Note  ____________________________________________  Time seen: Approximately 3:08 PM  I have reviewed the triage vital signs and the nursing notes.   HISTORY  Chief Complaint Failure To Thrive  Level 5 Caveat: Portions of the History and Physical including HPI and review of systems are unable to be completely obtained due to patient being a poor historian    HPI Miranda Ryan is a 73 y.o. female with a history of bipolar disorder, dementia, diabetes, COPD who is brought to the ED by family due to worsening generalized weakness.  They note that symptoms have been worsening since December 8.  The patient was seen in the ED on December 8 and had reassuring work-up at that time and was discharged to continue outpatient follow-up.  No falls or acute head trauma.  No vomiting fever shortness of breath cough or other concerns.  They note that the patient has poor oral intake and incontinence.  Over the last 24 to 48 hours, the patient has become too weak to be able to stand up.      Past Medical History:  Diagnosis Date  . Acute exacerbation of chronic obstructive pulmonary disease (COPD) ([place]) 04/20/2016  . Anxiety   . Bipolar disorder ([place])   . Dementia ([place])   . Depression   . Diabetes mellitus, type II ([place])   . Incontinence of urine      Patient Active Problem List   Diagnosis Date Noted  . Acute exacerbation of chronic obstructive pulmonary disease (COPD) ([place]) 04/20/2016     Past Surgical History:  Procedure Laterality Date  . CERVIX SURGERY       Prior to Admission medications   Not on File     Allergies Demerol [meperidine]   Family History  Problem Relation Age of Onset  . Liver cancer Mother   . Heart attack Father   . Heart disease Sister   . Heart disease Brother   . Heart disease Brother   . Heart disease Brother     Social History Social History   Tobacco Use  . Smoking  status: Current Every Day Smoker    Packs/day: 1.00    Years: 46.00    Pack years: 46.00    Types: Cigarettes    Start date: 09/16/1964  . Smokeless tobacco: Never Used  Substance Use Topics  . Alcohol use: Not Currently    Alcohol/week: 0.0 standard drinks    Comment: occasionally  . Drug use: No    Review of Systems Level 5 Caveat: Portions of the History and Physical including HPI and review of systems are unable to be completely obtained due to patient being a poor historian   Constitutional:   No fever or chills.  ENT:   No sore throat. No rhinorrhea. Cardiovascular:   No chest pain or syncope. Respiratory:   No dyspnea or cough. Gastrointestinal:   Negative for abdominal pain, vomiting and diarrhea.  Musculoskeletal:   Negative for focal pain or swelling All other systems reviewed and are negative except as documented above in ROS and HPI.  ____________________________________________   PHYSICAL EXAM:  VITAL SIGNS: ED Triage Vitals  Enc Vitals Group     BP 02/21/19 1033 103/68     Pulse Rate 02/21/19 1033 66     Resp 02/21/19 1033 16     Temp 02/21/19 1033 97.7 F (36.5 C)     Temp Source 02/21/19 1033 Oral     SpO2 02/21/19 1033  99 %     Weight 02/21/19 1034 145 lb (65.8 kg)     Height 02/21/19 1034 5\' 1"  (1.549 m)     Head Circumference --      Peak Flow --      Pain Score 02/21/19 1034 0     Pain Loc --      Pain Edu? --      Excl. in [place]? --     Vital signs reviewed, nursing assessments reviewed.   Constitutional:   Alert and oriented to person and place. Non-toxic appearance. Eyes:   Conjunctivae are normal. EOMI. PERRL. ENT      Head:   Normocephalic and atraumatic.      Nose:   Wearing a mask.      Mouth/Throat:   Wearing a mask.      Neck:   No meningismus. Full ROM. Hematological/Lymphatic/Immunilogical:   No cervical lymphadenopathy. Cardiovascular:   RRR. Symmetric bilateral radial and DP pulses.  No murmurs. Cap refill less than 2  seconds. Respiratory:   Normal respiratory effort without tachypnea/retractions. Breath sounds are clear and equal bilaterally. No wheezes/rales/rhonchi. Gastrointestinal:   Soft and nontender. Non distended. There is no CVA tenderness.  No rebound, rigidity, or guarding.  Musculoskeletal:   Normal range of motion in all extremities. No joint effusions.  No lower extremity tenderness.  No edema. Neurologic:   Normal speech and language.  Cranial nerves III through XII intact GCS 14 No upper extremity drift Unable to maintain left leg above bed up against gravity Skin:    Skin is warm, dry and intact. No rash noted.  No petechiae, purpura, or bullae.  ____________________________________________    LABS (pertinent positives/negatives) (all labs ordered are listed, but only abnormal results are displayed) Labs Reviewed  COMPREHENSIVE METABOLIC PANEL - Abnormal; Notable for the following components:      Result Value   Glucose, Bld 276 (*)    BUN 38 (*)    Creatinine, Ser 1.46 (*)    Albumin 2.5 (*)    Alkaline Phosphatase 130 (*)    GFR calc non Af Amer 35 (*)    GFR calc Af Amer 41 (*)    All other components within normal limits  CBC - Abnormal; Notable for the following components:   WBC 17.0 (*)    Hemoglobin 11.3 (*)    HCT 34.9 (*)    All other components within normal limits  URINE CULTURE  RESPIRATORY PANEL BY RT PCR (FLU A&B, COVID)  URINALYSIS, COMPLETE (UACMP) WITH MICROSCOPIC   ____________________________________________   EKG    ____________________________________________    RADIOLOGY  No results found.  ____________________________________________   PROCEDURES Procedures  ____________________________________________  DIFFERENTIAL DIAGNOSIS   Progressive dementia, dehydration, electrolyte abnormality, UTI with delirium, pneumonia, COVID-19, subacute stroke  CLINICAL IMPRESSION / ASSESSMENT AND PLAN / ED COURSE  Medications ordered in the  ED: Medications  sodium chloride flush (NS) 0.9 % injection 3 mL (3 mLs Intravenous Not Given 02/21/19 1415)    Pertinent labs & imaging results that were available during my care of the patient were reviewed by me and considered in my medical decision making (see chart for details).  Mali Walp Bosket was evaluated in Emergency Department on 02/21/2019 for the symptoms described in the history of present illness. She was evaluated in the context of the global COVID-19 pandemic, which necessitated consideration that the patient might be at risk for infection with the SARS-CoV-2 virus that causes COVID-19. Institutional protocols and  algorithms that pertain to the evaluation of patients at risk for COVID-19 are in a state of rapid change based on information released by regulatory bodies including the CDC and federal and state organizations. These policies and algorithms were followed during the patient's care in the ED.   Patient presents with worsening generalized weakness and confusion according to daughter.  She does have a history of dementia.  Presentation not worrisome for trauma, but exam reveals some relative weakness in the left lower extremity.  She has previously already had CT scan of the head so I will obtain an MRI of the brain today.  Initial lab panel is unremarkable, waiting for urinalysis, chest x-ray, repeat Covid test.  Will sign out to oncoming physician to follow-up on results.  If work-up is unrevealing, may need referral to social work to facilitate a safe disposition.      ____________________________________________   FINAL CLINICAL IMPRESSION(S) / ED DIAGNOSES    Final diagnoses:  Generalized weakness     ED Discharge Orders    None      Portions of this note were generated with dragon dictation software. Dictation errors may occur despite best attempts at proofreading.   Carrie Mew, MD 02/21/19 [phone]

## 2019-02-22 ENCOUNTER — Observation Stay (HOSPITAL_BASED_OUTPATIENT_CLINIC_OR_DEPARTMENT_OTHER)
Admit: 2019-02-22 | Discharge: 2019-02-22 | Disposition: A | Discharge status: Home / Self Care | Payer: Medicare Other | Attending: Family Medicine | Admitting: Family Medicine

## 2019-02-22 ENCOUNTER — Observation Stay: Admit: 2019-02-22 | Payer: Medicare Other

## 2019-02-22 DIAGNOSIS — F319 Bipolar disorder, unspecified: Secondary | ICD-10-CM | POA: Diagnosis present

## 2019-02-22 DIAGNOSIS — R262 Difficulty in walking, not elsewhere classified: Secondary | ICD-10-CM | POA: Diagnosis present

## 2019-02-22 DIAGNOSIS — R3989 Other symptoms and signs involving the genitourinary system: Secondary | ICD-10-CM

## 2019-02-22 DIAGNOSIS — I342 Nonrheumatic mitral (valve) stenosis: Secondary | ICD-10-CM | POA: Diagnosis not present

## 2019-02-22 DIAGNOSIS — I639 Cerebral infarction, unspecified: Secondary | ICD-10-CM

## 2019-02-22 DIAGNOSIS — R32 Unspecified urinary incontinence: Secondary | ICD-10-CM | POA: Diagnosis present

## 2019-02-22 DIAGNOSIS — I050 Rheumatic mitral stenosis: Secondary | ICD-10-CM | POA: Diagnosis not present

## 2019-02-22 DIAGNOSIS — N1832 Chronic kidney disease, stage 3b: Secondary | ICD-10-CM | POA: Diagnosis present

## 2019-02-22 DIAGNOSIS — R7989 Other specified abnormal findings of blood chemistry: Secondary | ICD-10-CM | POA: Diagnosis not present

## 2019-02-22 DIAGNOSIS — N390 Urinary tract infection, site not specified: Secondary | ICD-10-CM | POA: Diagnosis present

## 2019-02-22 DIAGNOSIS — I340 Nonrheumatic mitral (valve) insufficiency: Secondary | ICD-10-CM

## 2019-02-22 DIAGNOSIS — F0390 Unspecified dementia without behavioral disturbance: Secondary | ICD-10-CM | POA: Diagnosis present

## 2019-02-22 DIAGNOSIS — Z800 Family history of malignant neoplasm of digestive organs: Secondary | ICD-10-CM | POA: Diagnosis not present

## 2019-02-22 DIAGNOSIS — F419 Anxiety disorder, unspecified: Secondary | ICD-10-CM | POA: Diagnosis present

## 2019-02-22 DIAGNOSIS — R29703 NIHSS score 3: Secondary | ICD-10-CM | POA: Diagnosis present

## 2019-02-22 DIAGNOSIS — Z79899 Other long term (current) drug therapy: Secondary | ICD-10-CM | POA: Diagnosis not present

## 2019-02-22 DIAGNOSIS — F17210 Nicotine dependence, cigarettes, uncomplicated: Secondary | ICD-10-CM | POA: Diagnosis present

## 2019-02-22 DIAGNOSIS — Z20822 Contact with and (suspected) exposure to covid-19: Secondary | ICD-10-CM | POA: Diagnosis present

## 2019-02-22 DIAGNOSIS — R627 Adult failure to thrive: Secondary | ICD-10-CM | POA: Diagnosis present

## 2019-02-22 DIAGNOSIS — D631 Anemia in chronic kidney disease: Secondary | ICD-10-CM | POA: Diagnosis present

## 2019-02-22 DIAGNOSIS — E1122 Type 2 diabetes mellitus with diabetic chronic kidney disease: Secondary | ICD-10-CM | POA: Diagnosis present

## 2019-02-22 DIAGNOSIS — G9340 Encephalopathy, unspecified: Secondary | ICD-10-CM | POA: Diagnosis present

## 2019-02-22 DIAGNOSIS — R531 Weakness: Secondary | ICD-10-CM

## 2019-02-22 DIAGNOSIS — R29810 Facial weakness: Secondary | ICD-10-CM | POA: Diagnosis present

## 2019-02-22 DIAGNOSIS — R1310 Dysphagia, unspecified: Secondary | ICD-10-CM | POA: Diagnosis present

## 2019-02-22 DIAGNOSIS — I129 Hypertensive chronic kidney disease with stage 1 through stage 4 chronic kidney disease, or unspecified chronic kidney disease: Secondary | ICD-10-CM | POA: Diagnosis present

## 2019-02-22 DIAGNOSIS — Z8249 Family history of ischemic heart disease and other diseases of the circulatory system: Secondary | ICD-10-CM | POA: Diagnosis not present

## 2019-02-22 HISTORY — DX: Cerebral infarction, unspecified: I63.9

## 2019-02-22 LAB — CBC WITH DIFFERENTIAL/PLATELET
Abs Immature Granulocytes: 0.1 10*3/uL — ABNORMAL HIGH (ref 0.00–0.07)
Basophils Absolute: 0 10*3/uL (ref 0.0–0.1)
Basophils Relative: 0 %
Eosinophils Absolute: 0 10*3/uL (ref 0.0–0.5)
Eosinophils Relative: 0 %
HCT: 32.3 % — ABNORMAL LOW (ref 36.0–46.0)
Hemoglobin: 10 g/dL — ABNORMAL LOW (ref 12.0–15.0)
Immature Granulocytes: 1 %
Lymphocytes Relative: 10 %
Lymphs Abs: 1.4 10*3/uL (ref 0.7–4.0)
MCH: 26.2 pg (ref 26.0–34.0)
MCHC: 31 g/dL (ref 30.0–36.0)
MCV: 84.8 fL (ref 80.0–100.0)
Monocytes Absolute: 1.6 10*3/uL — ABNORMAL HIGH (ref 0.1–1.0)
Monocytes Relative: 12 %
Neutro Abs: 10.6 10*3/uL — ABNORMAL HIGH (ref 1.7–7.7)
Neutrophils Relative %: 77 %
Platelets: 252 10*3/uL (ref 150–400)
RBC: 3.81 MIL/uL — ABNORMAL LOW (ref 3.87–5.11)
RDW: 13.7 % (ref 11.5–15.5)
WBC: 13.8 10*3/uL — ABNORMAL HIGH (ref 4.0–10.5)
nRBC: 0 % (ref 0.0–0.2)

## 2019-02-22 LAB — BASIC METABOLIC PANEL
Anion gap: 10 (ref 5–15)
BUN: 41 mg/dL — ABNORMAL HIGH (ref 8–23)
CO2: 23 mmol/L (ref 22–32)
Calcium: 8.3 mg/dL — ABNORMAL LOW (ref 8.9–10.3)
Chloride: 105 mmol/L (ref 98–111)
Creatinine, Ser: 1.27 mg/dL — ABNORMAL HIGH (ref 0.44–1.00)
GFR calc Af Amer: 48 mL/min — ABNORMAL LOW (ref >60)
GFR calc non Af Amer: 42 mL/min — ABNORMAL LOW (ref >60)
Glucose, Bld: 189 mg/dL — ABNORMAL HIGH (ref 70–99)
Potassium: 4.1 mmol/L (ref 3.5–5.1)
Sodium: 138 mmol/L (ref 135–145)

## 2019-02-22 LAB — ECHOCARDIOGRAM COMPLETE
Height: 61 in
Weight: 2321 oz

## 2019-02-22 LAB — HEMOGLOBIN A1C
Hgb A1c MFr Bld: 7.8 % — ABNORMAL HIGH (ref 4.8–5.6)
Mean Plasma Glucose: 177.16 mg/dL

## 2019-02-22 LAB — MAGNESIUM: Magnesium: 2 mg/dL (ref 1.7–2.4)

## 2019-02-22 LAB — LIPID PANEL
Cholesterol: 107 mg/dL (ref 0–200)
HDL: 19 mg/dL — ABNORMAL LOW (ref >40)
LDL Cholesterol: 65 mg/dL (ref 0–99)
Total CHOL/HDL Ratio: 5.6 RATIO
Triglycerides: 115 mg/dL (ref <150)
VLDL: 23 mg/dL (ref 0–40)

## 2019-02-22 LAB — URINE CULTURE

## 2019-02-22 LAB — FOLATE: Folate: 7.8 ng/mL (ref >5.9)

## 2019-02-22 MED ORDER — ORAL CARE MOUTH RINSE
15.0000 mL | Freq: Two times a day (BID) | OROMUCOSAL | Status: DC
  Administered 2019-02-22 – 2019-02-27 (×10): 15 mL via OROMUCOSAL

## 2019-02-22 MED ORDER — METOPROLOL TARTRATE 5 MG/5ML IV SOLN
2.5000 mg | Freq: Once | INTRAVENOUS | Status: AC | PRN
  Administered 2019-02-22: 21:00:00 2.5 mg via INTRAVENOUS
  Filled 2019-02-22: qty 5

## 2019-02-22 MED ORDER — CLOPIDOGREL BISULFATE 75 MG PO TABS
75.0000 mg | ORAL_TABLET | Freq: Every day | ORAL | Status: DC
  Administered 2019-02-22 – 2019-02-27 (×6): 75 mg via ORAL
  Filled 2019-02-22 (×6): qty 1

## 2019-02-22 MED ORDER — ENSURE ENLIVE PO LIQD
237.0000 mL | Freq: Two times a day (BID) | ORAL | Status: DC
  Administered 2019-02-22 – 2019-02-27 (×6): 237 mL via ORAL

## 2019-02-22 NOTE — Progress Notes (Signed)
*  PRELIMINARY RESULTS* Echocardiogram 2D Echocardiogram has been performed.  Miranda Ryan 02/22/2019, 10:45 AM

## 2019-02-22 NOTE — Evaluation (Addendum)
Clinical/Bedside Swallow Evaluation Patient Details  Name: Miranda Ryan MRN: [id number] Date of Birth: 1945-07-01  Today's Date: 02/22/2019 Time: SLP Start Time (ACUTE ONLY): [id number] SLP Stop Time (ACUTE ONLY): 1015 SLP Time Calculation (min) (ACUTE ONLY): 50 min  Past Medical History:  Past Medical History:  Diagnosis Date  . Acute exacerbation of chronic obstructive pulmonary disease (COPD) ([place]) 04/20/2016  . Anxiety   . Bipolar disorder ([place])   . Dementia ([place])   . Depression   . Diabetes mellitus, type II ([place])   . Incontinence of urine    Past Surgical History:  Past Surgical History:  Procedure Laterality Date  . CERVIX SURGERY     HPI:  Pt is a 73 y.o. female witht past medical history for anxiety, Bipolar Dis, Dementia, diabetes, urinary incontinence, who presented with complaints of generalized weakness and decreased p.o. intake.  MRI of the brain performed and shows small acute infarcts in the left cerebral white matter.  Concern is for large vessel to small vessel embolism.  Family concnerned d/t worsening generalized weakness.  They note that symptoms have been worsening since December 8.  CXR: "No active cardiopulmonary disease".  Per MD note, Patient started on rocephin for urinalysis results indicative of infection/UTI.    Assessment / Plan / Recommendation Clinical Impression  Pt appears to present w/ adequate oropharyngeal phase swallow function w/ reduced risk for prandial aspiration when following general aspiration precautions; and given tray setup and support at meals d/t Cognitive decline (at Baseline, Dementia) and current UTI/infection per MD.  Pt was supported in sitting upright/forward then presentated w/ trials of thin liquids, purees, ice chips. No overt clinical s/s of aspiration noted during/post trials; no decline in vocal quality, respiratory status. Oral phase appeared [facility] for bolus management of the trials given. Pt exhibited timely bolus manipulation and  A-P transfer w/ full oral clearing b/t trials. Pt does Not have her Dentures present which she states she wears at home when eating meals so trials of solid foods were not given. Pt helped to feed self w/ setup. OM exam was [facility] w/ no unilateral weakness noted.  Recommend a Puree diet consistency d/t Edentulous status and lacking her Dentures; Thin liquids VIA CUP for best management. Rec. general aspiration precautions - reduce distractions at meals; tray setup at meals. Monitoring for any needs, food choices and consistencies d/t baseline Cognitive decline/Dementia. Rec. Dietician f/u for nutritional support as Dementia can impact pt's overal desire/focus on foods/oral intake. When pt is able to have her Dentures again, suspect she can return to her baseline diet (soft foods cut for her for easier chewing) - must ensure secure fit.  SLP Visit Diagnosis: Dysphagia, unspecified (R13.10)(lacks Dentures at this time)    Aspiration Risk  Risk for inadequate nutrition/hydration(reduced following general precs; Dementia)    Diet Recommendation  Dysphagia level 1 (PUREE) w/ Thin liquids -- pt is w/out her Dentures currently. General aspiration precautions; Monitoring for any needs and encouragement during meals d/t Dementia  Medication Administration: Whole meds with puree(for safer, easier swallowing d/t Dementia)    Other  Recommendations Recommended Consults: (Dietician f/u for support; Palliative care consult for ed.) Oral Care Recommendations: Oral care BID;Oral care before and after PO;Staff/trained caregiver to provide oral care Other Recommendations: (n/a)   Follow up Recommendations None      Frequency and Duration (n/a)  (n/a)       Prognosis Prognosis for Safe Diet Advancement: Fair(-Good) Barriers to Reach Goals: Cognitive deficits;Time post onset;Severity  of deficits Barriers/Prognosis Comment: lacks her Dentures here at hospital      Swallow Study   General Date of Onset:  02/21/19 HPI: Pt is a 73 y.o. female witht past medical history for anxiety, Bipolar Dis, Dementia, diabetes, urinary incontinence, who presented with complaints of generalized weakness and decreased p.o. intake.  MRI of the brain performed and shows small acute infarcts in the left cerebral white matter.  Concern is for large vessel to small vessel embolism.  Family concnerned d/t worsening generalized weakness.  They note that symptoms have been worsening since December 8.  CXR: "No active cardiopulmonary disease".  Per MD note, Patient started on rocephin for urinalysis results indicative of infection/UTI.  Type of Study: Bedside Swallow Evaluation Previous Swallow Assessment: none reported Diet Prior to this Study: NPO Temperature Spikes Noted: No(wbc 13. declining from admission(UTI)) Respiratory Status: Room air History of Recent Intubation: No Behavior/Cognition: Alert;Cooperative;Pleasant mood;Confused;Distractible;Requires cueing Oral Cavity Assessment: Within Functional Limits Oral Care Completed by SLP: Yes Oral Cavity - Dentition: Edentulous(wears dentures at home) Vision: Functional for self-feeding Self-Feeding Abilities: Able to feed self;Needs assist;Needs set up Patient Positioning: Upright in bed(needed positioning cues) Baseline Vocal Quality: Normal;Low vocal intensity Volitional Cough: Strong Volitional Swallow: Able to elicit    Oral/Motor/Sensory Function Overall Oral Motor/Sensory Function: Within functional limits   Ice Chips Ice chips: Within functional limits Presentation: Spoon(fed; 3 trials)   Thin Liquid Thin Liquid: Within functional limits Presentation: Cup;Self Fed(~4 ozs+)    Nectar Thick Nectar Thick Liquid: Not tested   Honey Thick Honey Thick Liquid: Not tested   Puree Puree: Within functional limits Presentation: Self Fed;Spoon(~3.5 ozs)   Solid     Solid: Not tested Oral Phase Impairments: (edentulous) Other Comments: not tested d/t edentulous  status       Orinda Kenner, MS, CCC-SLP [facility] 02/22/2019,11:20 AM

## 2019-02-22 NOTE — Progress Notes (Signed)
Dr Izetta Dakin notified via text that patient had run of SVT and is now sinus tach heart rate 110.

## 2019-02-22 NOTE — Progress Notes (Signed)
Initial Nutrition Assessment  DOCUMENTATION CODES:   Not applicable  INTERVENTION:  Continue Ensure Enlive po BID, each supplement provides 350 kcal and 20 grams of protein.  Provide Magic cup TID with meals, each supplement provides 290 kcal and 9 grams of protein.  NUTRITION DIAGNOSIS:   Inadequate oral intake related to decreased appetite as evidenced by meal completion < 50%.  GOAL:   Patient will meet greater than or equal to 90% of their needs  MONITOR:   PO intake, Supplement acceptance, Labs, Weight trends, I & O's  REASON FOR ASSESSMENT:   Consult Assessment of nutrition requirement/status  ASSESSMENT:   73 year old female with PMHx of dementia, bipolar disorder, anxiety, depression, DM, COPD, CKD admitted with generalized weakness, suspected UTI, acute ischemic stroke.   According to chart patient unable to provide history at this time. She was placed on dysphagia 1 diet with thin liquids following SLP evaluation this morning. She was ordered for Ensure Enlive BID. According to chart patient had decreased PO intake prior to admission and was having trouble swallowing solid food.  Limited recent weight history in chart to trend. Patient is currently 65.8 kg (145.06 lbs). In 2019 she was 61-64 kg.  Medications reviewed and include: NS at 75 mL/hr, ceftriaxone.  Labs reviewed: BUN 41, Creatinine 1.27.  Unable to determine if patient meets criteria for malnutrition at this time.  NUTRITION - FOCUSED PHYSICAL EXAM:  Unable to complete at this time.  Diet Order:   Diet Order            DIET - DYS 1 Room service appropriate? Yes with Assist; Fluid consistency: Thin  Diet effective now             EDUCATION NEEDS:   No education needs have been identified at this time  Skin:  Skin Assessment: Reviewed RN Assessment  Last BM:  Unknown  Height:   Ht Readings from Last 1 Encounters:  02/21/19 5\' 1"  (1.549 m)   Weight:   Wt Readings from Last 1  Encounters:  02/21/19 65.8 kg   Ideal Body Weight:  47.7 kg  BMI:  Body mass index is 27.41 kg/m.  Estimated Nutritional Needs:   Kcal:  1500-1700  Protein:  75-85 grams  Fluid:  1.5-1.7 L/day  Jacklynn Barnacle, MS, RD, LDN Office: [phone] Pager: [phone] After Hours/Weekend Pager: [phone]

## 2019-02-22 NOTE — Evaluation (Signed)
Occupational Therapy Evaluation Patient Details Name: Miranda Ryan MRN: [id number] DOB: 25-Jan-1946 Today's Date: 02/22/2019    History of Present Illness 73 y.o. female who due to mental status is unable to provide any history therefore all history obtained from the chart.  Patient with significant past medical history for anxiety, bipolar, dementia, diabetes, urinary incontinence, who presented to the emergency department for the second time in 10 days on yesterday with generalized weakness and decreased p.o. intake.  Patient was in the ED on 01/30/2019 with altered mental status.  According to the daughter, since patient was discharged from the ED she has not been doing well.  Also per daughter, patient has not been eating as much and was having trouble swallowing especially solid food.  Facial droop noted.  Patient brought back in for evaluation.  Initial NIHSS of 3.   Clinical Impression   Pt seen for OT evaluation this date. Prior to recent hospital admissions, per chart (dtr), pt was ambulating and performing basic ADL tasks independently. Pt lives her daughter. Pt unreliable historian otherwise and no family present to confirm additional information. Currently pt demonstrates impairments in strength, balance, and cognition requiring mod-max assist for bed mobility and initial Min A decreasing to CGA for dynamic sitting balance and Mod-Max for LB ADL. Pt able to follow 1 step commands with cues and additional time to initiate. Pt would benefit from skilled OT to address noted impairments and functional limitations (see below for any additional details) in order to maximize safety and independence while minimizing falls risk and caregiver burden.  Upon hospital discharge, recommend pt discharge to SNF.    Follow Up Recommendations  SNF    Equipment Recommendations  None recommended by OT    Recommendations for Other Services       Precautions / Restrictions Precautions Precautions:  Fall Restrictions Weight Bearing Restrictions: No      Mobility Bed Mobility Overal bed mobility: Needs Assistance Bed Mobility: Supine to Sit;Sit to Supine     Supine to sit: Mod assist;HOB elevated Sit to supine: Max assist   General bed mobility comments: cues for sequencing/initiation  Transfers                      Balance Overall balance assessment: Needs assistance Sitting-balance support: Feet unsupported;No upper extremity supported Sitting balance-Leahy Scale: Fair Sitting balance - Comments: slight posterior/lateral lean when attempting L knee extension requiring cues and Min A to correct, improving with repetition to CGA and cues Postural control: Posterior lean;Right lateral lean                                 ADL either performed or assessed with clinical judgement   ADL Overall ADL's : Needs assistance/impaired Eating/Feeding: Set up Eating/Feeding Details (indicate cue type and reason): modified diet Grooming: Bed level;Wash/dry face;Set up Grooming Details (indicate cue type and reason): cues for completing Upper Body Bathing: Sitting;Minimal assistance   Lower Body Bathing: Sitting/lateral leans;Moderate assistance;Maximal assistance   Upper Body Dressing : Sitting;Minimal assistance   Lower Body Dressing: Sitting/lateral leans;Moderate assistance;Maximal assistance                       Vision Patient Visual Report: No change from baseline Vision Assessment?: No apparent visual deficits Additional Comments: pt able to track therapist, correctly identify fingers held up in front of face, denied double vision or blurry  vision     Perception     Praxis      Pertinent Vitals/Pain Pain Assessment: No/denies pain(pt initially indicates reports L thigh pain (grimaces) with hip/knee flexion)     Hand Dominance Right   Extremity/Trunk Assessment Upper Extremity Assessment Upper Extremity Assessment: Generalized  weakness;Difficult to assess due to impaired cognition   Lower Extremity Assessment Lower Extremity Assessment: Difficult to assess due to impaired cognition;Generalized weakness(LLE slightly weaker, but difficult to formally assess)   Cervical / Trunk Assessment Cervical / Trunk Assessment: Kyphotic   Communication Communication Communication: Other (comment)(unclear whether HOH vs receptive difficulties)   Cognition Arousal/Alertness: Awake/alert Behavior During Therapy: WFL for tasks assessed/performed Overall Cognitive Status: History of cognitive impairments - at baseline                                 General Comments: pt alert, oriented to self and hospital, not sure why she is here, follows simple commands with cues   General Comments       Exercises     Shoulder Instructions      Home Living Family/patient expects to be discharged to:: Private residence Living Arrangements: Children(daughter) Available Help at Discharge: Family                             Additional Comments: Pt is unreliable historian, reports living with dtr      Prior Functioning/Environment Level of Independence: Independent        Comments: Per chart/pt, she was indep with mobility and ADL tasks before 12/8        OT Problem List: Decreased strength;Decreased cognition;Impaired balance (sitting and/or standing);Decreased knowledge of use of DME or AE;Decreased safety awareness      OT Treatment/Interventions: Self-care/ADL training;Therapeutic exercise;Therapeutic activities;Neuromuscular education;DME and/or AE instruction;Patient/family education;Balance training    OT Goals(Current goals can be found in the care plan section) Acute Rehab OT Goals Patient Stated Goal: go home OT Goal Formulation: With patient Time For Goal Achievement: 03/08/19 Potential to Achieve Goals: Good ADL Goals Pt Will Perform Upper Body Dressing: with supervision;sitting Pt  Will Perform Lower Body Dressing: with min guard assist;sit to/from stand Pt Will Transfer to Toilet: with min assist;ambulating;bedside commode(LRAD for amb')  OT Frequency: Min 1X/week   Barriers to D/C:            Co-evaluation              AM-PAC OT "6 Clicks" Daily Activity     Outcome Measure Help from another person eating meals?: None Help from another person taking care of personal grooming?: A Little Help from another person toileting, which includes using toliet, bedpan, or urinal?: A Lot Help from another person bathing (including washing, rinsing, drying)?: A Lot Help from another person to put on and taking off regular upper body clothing?: A Lot Help from another person to put on and taking off regular lower body clothing?: A Lot 6 Click Score: 15   End of Session    Activity Tolerance: Patient tolerated treatment well Patient left: in bed;with call bell/phone within reach;with bed alarm set  OT Visit Diagnosis: Other abnormalities of gait and mobility (R26.89);Muscle weakness (generalized) (M62.81);Other symptoms and signs involving cognitive function                Time: 1351-1421 OT Time Calculation (min): 30 min Charges:  OT General  Charges $OT Visit: 1 Visit OT Evaluation $OT Eval Moderate Complexity: 1 Mod OT Treatments $Therapeutic Activity: 8-22 mins  Jeni Salles, MPH, MS, OTR/L ascom [phone] 02/22/19, 4:45 PM

## 2019-02-22 NOTE — Progress Notes (Signed)
OT Cancellation Note  Patient Details Name: Miranda Ryan MRN: [id number] DOB: 12-Jul-1945   Cancelled Treatment:    Reason Eval/Treat Not Completed: Patient at procedure or test/ unavailable. Consult received, chart reviewed. Spoke with RN regarding removing bed rest order. On attempt, staff preparing to do heart sonogram. Will re-attempt OT evaluation at later time.   Jeni Salles, MPH, MS, OTR/L ascom [phone] 02/22/19, 10:15 AM

## 2019-02-22 NOTE — Progress Notes (Signed)
Brief Hx:  Miranda Ryan is an 73 y.o. female who due to mental status is unable to provide any history therefore all history obtained from the chart.  Patient withsignificantpast medical history for anxiety, bipolar, dementia, diabetes, urinary incontinence, who presented to the emergency department for the second time in 10 days on yesterday with generalized weakness and decreased p.o. intake. Patient was in the ED on 01/30/2019 with altered mental status. According to the daughter,since patient was discharged from the ED she has not been doing well.Also per daughter, patient has not been eating as much and was having trouble swallowing especially solid food. Facial droop noted.  Patient brought back in for evaluation.  Initial NIHSS of 3.  Subjective: Patient was sitting on bed this morning tried to eat but was not eating the food that was recommended by the speech therapist. She was also somewhat slow to respond.  Objective: Vital signs in last 24 hours: Temp:  [97.6 F (36.4 C)-98.8 F (37.1 C)] 97.9 F (36.6 C) (12/31 1245) Pulse Rate:  [88-101] 88 (12/31 1245) Resp:  [16-20] 16 (12/31 1245) BP: (98-155)/(62-77) 107/73 (12/31 1245) SpO2:  [97 %-100 %] 100 % (12/31 1245) Weight:  [65.8 kg] 65.8 kg (12/30 2142)  Intake/Output from previous day: 12/30 0701 - 12/31 0700 In: 1100  Out: -  Intake/Output this shift: No intake/output data recorded.   General exam: Thin built, appears somewhat dry and confused lying flat on bed in no acute distress  Head, eyes and ENT: Nontraumatic and normocephalic. Pupils equally reacting to light and accommodation. Oral mucosa moist.  Neck: Supple. No JVD, carotid bruit or thyromegaly.  Lymphatics: No lymphadenopathy.  Respiratory system: Clear to auscultation. No increased work of breathing.  Cardiovascular system: S1 and S2 heard, RRR. No JVD; + murmurs; No gallops, clicks or pedal edema.  Gastrointestinal system: Abdomen is  nondistended, soft and nontender. Normal bowel sounds heard. No organomegaly or masses appreciated.  Central nervous system: Alert oriented to herself and her daughter.  But mumbling in her word.  Was also having hard time to follow the command.  Strength 2-3 out of 4 on left upper and lower extremity.  3 out of 4 on rest of the extremity.  Sensation appears to be intact.  Reflexes appropriate.  Extremities: Symmetric;Peripheral pulses symmetrically felt.  No edema  Skin: No rashes or acute findings.  Musculoskeletal system: Negative exam.  Psychiatry: Alert and oriented to person.  Flat affect.  Results for orders placed or performed during the hospital encounter of 02/21/19 (from the past 24 hour(s))  Respiratory Panel by RT PCR (Flu A&B, Covid) - Nasopharyngeal Swab     Status: None   Collection Time: 02/21/19  5:17 PM   Specimen: Nasopharyngeal Swab  Result Value Ref Range   SARS Coronavirus 2 by RT PCR NEGATIVE NEGATIVE   Influenza A by PCR NEGATIVE NEGATIVE   Influenza B by PCR NEGATIVE NEGATIVE  Creatinine, serum     Status: Abnormal   Collection Time: 02/21/19  5:31 PM  Result Value Ref Range   Creatinine, Ser 1.31 (H) 0.44 - 1.00 mg/dL   GFR calc non Af Amer 40 (L) >60 mL/min   GFR calc Af Amer 46 (L) >60 mL/min  Blood Alcohol Level     Status: None   Collection Time: 02/21/19  5:31 PM  Result Value Ref Range   Alcohol, Ethyl (B) <10 <10 mg/dL  CULTURE, BLOOD (ROUTINE X 2) w Reflex to ID Panel  Status: None (Preliminary result)   Collection Time: 02/21/19  5:31 PM   Specimen: BLOOD  Result Value Ref Range   Specimen Description BLOOD BLOOD RIGHT FOREARM    Special Requests      BOTTLES DRAWN AEROBIC AND ANAEROBIC Blood Culture adequate volume   Culture      NO GROWTH < 24 HOURS Performed at [facility], [address] [place], [place] [address]    Report Status PENDING   CULTURE, BLOOD (ROUTINE X 2) w Reflex to ID Panel     Status: None (Preliminary  result)   Collection Time: 02/21/19  5:31 PM   Specimen: BLOOD  Result Value Ref Range   Specimen Description BLOOD BLOOD RIGHT WRIST    Special Requests      BOTTLES DRAWN AEROBIC AND ANAEROBIC Blood Culture results may not be optimal due to an inadequate volume of blood received in culture bottles   Culture      NO GROWTH < 24 HOURS Performed at [facility], [address] [place], [place] [address]    Report Status PENDING   Urinalysis, Complete w Microscopic     Status: Abnormal   Collection Time: 02/21/19  8:08 PM  Result Value Ref Range   Color, Urine YELLOW (A) YELLOW   APPearance TURBID (A) CLEAR   Specific Gravity, Urine 1.018 1.005 - 1.030   pH 6.0 5.0 - 8.0   Glucose, UA NEGATIVE NEGATIVE mg/dL   Hgb urine dipstick SMALL (A) NEGATIVE   Bilirubin Urine NEGATIVE NEGATIVE   Ketones, ur NEGATIVE NEGATIVE mg/dL   Protein, ur 100 (A) NEGATIVE mg/dL   Nitrite POSITIVE (A) NEGATIVE   Leukocytes,Ua MODERATE (A) NEGATIVE   Squamous Epithelial / LPF NONE SEEN 0 - 5   Non Squamous Epithelial PRESENT (A) NONE SEEN   WBC, UA >50 0 - 5 WBC/hpf   RBC / HPF 21-50 0 - 5 RBC/hpf   Bacteria, UA MANY (A) NONE SEEN   WBC Clumps PRESENT    Mucus PRESENT   Urine Drug Screen, Qualitative ([facility] only)     Status: None   Collection Time: 02/21/19  8:08 PM  Result Value Ref Range   Tricyclic, Ur Screen NONE DETECTED NONE DETECTED   Amphetamines, Ur Screen NONE DETECTED NONE DETECTED   MDMA (Ecstasy)Ur Screen NONE DETECTED NONE DETECTED   Cocaine Metabolite,Ur [place] NONE DETECTED NONE DETECTED   Opiate, Ur Screen NONE DETECTED NONE DETECTED   Phencyclidine (PCP) Ur S NONE DETECTED NONE DETECTED   Cannabinoid 50 Ng, Ur  NONE DETECTED NONE DETECTED   Barbiturates, Ur Screen NONE DETECTED NONE DETECTED   Benzodiazepine, Ur Scrn NONE DETECTED NONE DETECTED   Methadone Scn, Ur NONE DETECTED NONE DETECTED  Hemoglobin A1c     Status: Abnormal   Collection Time: 02/22/19  3:49 AM   Result Value Ref Range   Hgb A1c MFr Bld 7.8 (H) 4.8 - 5.6 %   Mean Plasma Glucose 177.16 mg/dL  Lipid panel     Status: Abnormal   Collection Time: 02/22/19  3:49 AM  Result Value Ref Range   Cholesterol 107 0 - 200 mg/dL   Triglycerides 115 <150 mg/dL   HDL 19 (L) >40 mg/dL   Total CHOL/HDL Ratio 5.6 RATIO   VLDL 23 0 - 40 mg/dL   LDL Cholesterol 65 0 - 99 mg/dL  Folate     Status: None   Collection Time: 02/22/19  3:49 AM  Result Value Ref Range  Folate 7.8 >5.9 ng/mL  CBC with Differential/Platelet     Status: Abnormal   Collection Time: 02/22/19  3:49 AM  Result Value Ref Range   WBC 13.8 (H) 4.0 - 10.5 K/uL   RBC 3.81 (L) 3.87 - 5.11 MIL/uL   Hemoglobin 10.0 (L) 12.0 - 15.0 g/dL   HCT 32.3 (L) 36.0 - 46.0 %   MCV 84.8 80.0 - 100.0 fL   MCH 26.2 26.0 - 34.0 pg   MCHC 31.0 30.0 - 36.0 g/dL   RDW 13.7 11.5 - 15.5 %   Platelets 252 150 - 400 K/uL   nRBC 0.0 0.0 - 0.2 %   Neutrophils Relative % 77 %   Neutro Abs 10.6 (H) 1.7 - 7.7 K/uL   Lymphocytes Relative 10 %   Lymphs Abs 1.4 0.7 - 4.0 K/uL   Monocytes Relative 12 %   Monocytes Absolute 1.6 (H) 0.1 - 1.0 K/uL   Eosinophils Relative 0 %   Eosinophils Absolute 0.0 0.0 - 0.5 K/uL   Basophils Relative 0 %   Basophils Absolute 0.0 0.0 - 0.1 K/uL   Immature Granulocytes 1 %   Abs Immature Granulocytes 0.10 (H) 0.00 - 0.07 K/uL  Basic metabolic panel     Status: Abnormal   Collection Time: 02/22/19  3:49 AM  Result Value Ref Range   Sodium 138 135 - 145 mmol/L   Potassium 4.1 3.5 - 5.1 mmol/L   Chloride 105 98 - 111 mmol/L   CO2 23 22 - 32 mmol/L   Glucose, Bld 189 (H) 70 - 99 mg/dL   BUN 41 (H) 8 - 23 mg/dL   Creatinine, Ser 1.27 (H) 0.44 - 1.00 mg/dL   Calcium 8.3 (L) 8.9 - 10.3 mg/dL   GFR calc non Af Amer 42 (L) >60 mL/min   GFR calc Af Amer 48 (L) >60 mL/min   Anion gap 10 5 - 15  Magnesium     Status: None   Collection Time: 02/22/19  3:49 AM  Result Value Ref Range   Magnesium 2.0 1.7 - 2.4 mg/dL     Studies/Results: DG Chest 2 View  Result Date: 02/21/2019 CLINICAL DATA:  Weakness EXAM: CHEST - 2 VIEW COMPARISON:  04/20/2016.  January 30, 2019. FINDINGS: The heart size and mediastinal contours are within normal limits. Both lungs are clear. The visualized skeletal structures are unremarkable. IMPRESSION: No active cardiopulmonary disease. Electronically Signed   By: Constance Holster M.D.   On: 02/21/2019 16:03   CT Head Wo Contrast  Result Date: 01/30/2019 CLINICAL DATA:  Altered mental status. EXAM: CT HEAD WITHOUT CONTRAST TECHNIQUE: Contiguous axial images were obtained from the base of the skull through the vertex without intravenous contrast. COMPARISON:  January 16, 2005. FINDINGS: Brain: Mild chronic ischemic white matter disease. No mass effect or midline shift is noted. Ventricular size is within normal limits. There is no evidence of mass lesion, hemorrhage or acute infarction. Vascular: No hyperdense vessel or unexpected calcification. Skull: Normal. Negative for fracture or focal lesion. Sinuses/Orbits: No acute finding. Other: None. IMPRESSION: Mild chronic ischemic white matter disease. No acute intracranial abnormality seen. Electronically Signed   By: Marijo Conception M.D.   On: 01/30/2019 15:22   MR BRAIN WO CONTRAST  Result Date: 02/21/2019 CLINICAL DATA:  Encephalopathy, worsening weakness EXAM: MRI HEAD WITHOUT CONTRAST TECHNIQUE: Multiplanar, multiecho pulse sequences of the brain and surrounding structures were obtained without intravenous contrast. COMPARISON:  None. FINDINGS: Brain: There is patchy reduced diffusion in the  frontal lobe involving the corona radiata. There is no acute infarction or intracranial hemorrhage. There is no intracranial mass, mass effect, or edema. There is no hydrocephalus or extra-axial fluid collection. Prominence of the ventricles and sulci reflects mild generalized parenchymal volume loss. Relative ventricular prominence is likely on an  ex vacuo basis. Patchy and confluent T2 hyperintensity in the supratentorial and pontine white matter is nonspecific but probably reflects moderate to marked chronic microvascular ischemic changes. Left anterior temporal encephalomalacia. Vascular: Major vessel flow voids at the skull base are preserved. Skull and upper cervical spine: Normal marrow signal is preserved. Sinuses/Orbits: Paranasal sinuses are aerated. Orbits are unremarkable. Other: Sella is unremarkable.  Mastoid air cells are clear. IMPRESSION: Small acute infarcts of the deep left cerebral white matter. May reflect small vessel or watershed type infarcts. Moderate to marked chronic microvascular ischemic changes. Left anterior temporal encephalomalacia. Electronically Signed   By: Macy Mis M.D.   On: 02/21/2019 15:55   US Carotid Bilateral (at [facility] and [facility] only)  Result Date: 02/22/2019 CLINICAL DATA:  73 year old female with history of ischemic stroke EXAM: BILATERAL CAROTID DUPLEX ULTRASOUND TECHNIQUE: Pearline Cables scale imaging, color Doppler and duplex ultrasound were performed of bilateral carotid and vertebral arteries in the neck. COMPARISON:  None. FINDINGS: Criteria: Quantification of carotid stenosis is based on velocity parameters that correlate the residual internal carotid diameter with NASCET-based stenosis levels, using the diameter of the distal internal carotid lumen as the denominator for stenosis measurement. The following velocity measurements were obtained: RIGHT ICA:  Systolic XX123456 cm/sec, Diastolic 17 cm/sec CCA:  89 cm/sec SYSTOLIC ICA/CCA RATIO:  1.2 ECA:  131 cm/sec LEFT ICA:  Systolic XX123456 cm/sec, Diastolic 16 cm/sec CCA:  85 cm/sec SYSTOLIC ICA/CCA RATIO:  1.2 ECA:  124 cm/sec Right Brachial SBP: Not acquired Left Brachial SBP: Not acquired RIGHT CAROTID ARTERY: No significant calcifications of the right common carotid artery. Intermediate waveform maintained. Heterogeneous and partially calcified plaque at the right  carotid bifurcation. No significant lumen shadowing. Low resistance waveform of the right ICA. No significant tortuosity. RIGHT VERTEBRAL ARTERY: Antegrade flow with low resistance waveform. LEFT CAROTID ARTERY: No significant calcifications of the left common carotid artery. Intermediate waveform maintained. Heterogeneous and partially calcified plaque at the left carotid bifurcation without significant lumen shadowing. Low resistance waveform of the left ICA. No significant tortuosity. LEFT VERTEBRAL ARTERY:  Antegrade flow with low resistance waveform. IMPRESSION: Color duplex indicates moderate heterogeneous and calcified plaque, with no hemodynamically significant stenosis by duplex criteria in the extracranial cerebrovascular circulation. Signed, Dulcy Fanny. Dellia Nims, RPVI [facility] Radiology Electronically Signed   By: Corrie Mckusick D.O.   On: 02/22/2019 07:44   DG Chest Portable 1 View  Result Date: 01/30/2019 CLINICAL DATA:  Patient presents for cough and ams. Patient is non verbal at this time. Hx of dm, copd, dementia.cough EXAM: PORTABLE CHEST 1 VIEW COMPARISON:  07/16/2017 FINDINGS: The heart size and mediastinal contours are within normal limits. Both lungs are clear. The visualized skeletal structures are unremarkable. IMPRESSION: No active disease. Electronically Signed   By: Nolon Nations M.D.   On: 01/30/2019 15:32   ECHOCARDIOGRAM COMPLETE  Result Date: 02/22/2019   ECHOCARDIOGRAM REPORT   Patient Name:   AZZARIA OLEAR Date of Exam: 02/22/2019 Medical Rec #:  [id number]      Height:       61.0 in Accession #:    [id number]     Weight:  145.1 lb Date of Birth:  1945/03/08       BSA:          1.65 m Patient Age:    10 years       BP:           155/73 mmHg Patient Gender: F              HR:           91 bpm. Exam Location:  [facility] Procedure: 2D Echo, Cardiac Doppler and Color Doppler Indications:     Stroke 434.91  History:          Patient has no prior history of Echocardiogram examinations.                  COPD; Risk Factors:Diabetes. Bi-polar disorder, dementia.  Sonographer:     Sherrie Sport RDCS (AE) Referring Phys:  [id number] Ariannie Penaloza Diagnosing Phys: Kate Sable MD IMPRESSIONS  1. Left ventricular ejection fraction, by visual estimation, is 70 to 75%. The left ventricle has hyperdynamic function. There is borderline left ventricular hypertrophy.  2. Left ventricular diastolic parameters are consistent with Grade II diastolic dysfunction (pseudonormalization).  3. The left ventricle has no regional wall motion abnormalities.  4. Global right ventricle has normal systolic function.The right ventricular size is normal. Right vetricular wall thickness was not assessed.  5. Left atrial size was normal.  6. Right atrial size was normal.  7. Severe calcification of the posterior mitral valve leaflet(s).  8. The mitral valve is degenerative. Mild mitral valve regurgitation. Severe mitral stenosis.  9. Mitral valve Mean gradient is 38mmHg at Heart rate of 97bmp. 10. The tricuspid valve is not well visualized. 11. The aortic valve was not well visualized. Aortic valve regurgitation is not visualized. 12. The pulmonic valve was not well visualized. Pulmonic valve regurgitation is not visualized. FINDINGS  Left Ventricle: Left ventricular ejection fraction, by visual estimation, is 70 to 75%. The left ventricle has hyperdynamic function. The left ventricle has no regional wall motion abnormalities. There is borderline left ventricular hypertrophy. Left ventricular diastolic parameters are consistent with Grade II diastolic dysfunction (pseudonormalization). Right Ventricle: The right ventricular size is normal. Right vetricular wall thickness was not assessed. Global RV systolic function is has normal systolic function. Left Atrium: Left atrial size was normal in size. Right Atrium: Right atrial size was normal in size Pericardium: There is  no evidence of pericardial effusion. Mitral Valve: The mitral valve is degenerative in appearance. There is severe calcification of the posterior mitral valve leaflet(s). Mild mitral valve regurgitation. Severe mitral valve stenosis by observation. MV peak gradient, 33.9 mmHg. Mitral valve Mean gradient is 65mmHg at Heart rate of 97bmp. Tricuspid Valve: The tricuspid valve is not well visualized. Tricuspid valve regurgitation is not demonstrated. Aortic Valve: The aortic valve was not well visualized. Aortic valve regurgitation is not visualized. Aortic valve mean gradient measures 44.3 mmHg. Aortic valve peak gradient measures 89.9 mmHg. Aortic valve area, by VTI measures 4.45 cm. Pulmonic Valve: The pulmonic valve was not well visualized. Pulmonic valve regurgitation is not visualized. Pulmonic regurgitation is not visualized. Aorta: The aortic root is normal in size and structure. Venous: The inferior vena cava was not well visualized. IAS/Shunts: No atrial level shunt detected by color flow Doppler.  LEFT VENTRICLE PLAX 2D LVIDd:         3.32 cm  Diastology LVIDs:         2.45 cm  LV e' lateral:  3.92 cm/s LV PW:         1.33 cm  LV E/e' lateral: 36.7 LV IVS:        1.24 cm  LV e' medial:    2.83 cm/s LVOT diam:     2.00 cm  LV E/e' medial:  50.9 LV SV:         24 ml LV SV Index:   13.84 LVOT Area:     3.14 cm  RIGHT VENTRICLE RV Basal diam:  2.63 cm RV S prime:     13.50 cm/s TAPSE (M-mode): 2.4 cm LEFT ATRIUM             Index       RIGHT ATRIUM          Index LA diam:        3.00 cm 1.82 cm/m  RA Area:     8.87 cm LA Vol (A2C):   56.3 ml 34.17 ml/m RA Volume:   16.10 ml 9.77 ml/m LA Vol (A4C):   32.6 ml 19.78 ml/m LA Biplane Vol: 47.3 ml 28.71 ml/m  AORTIC VALVE                    PULMONIC VALVE AV Area (Vmax):    3.73 cm     RVOT Peak grad: 15 mmHg AV Area (Vmean):   4.10 cm AV Area (VTI):     4.45 cm AV Vmax:           474.00 cm/s AV Vmean:          292.000 cm/s AV VTI:            0.812 m AV  Peak Grad:      89.9 mmHg AV Mean Grad:      44.3 mmHg LVOT Vmax:         563.00 cm/s LVOT Vmean:        381.000 cm/s LVOT VTI:          1.150 m LVOT/AV VTI ratio: 1.42  AORTA Ao Root diam: 2.60 cm MITRAL VALVE                         TRICUSPID VALVE MV Area (PHT): 3.00 cm              TR Peak grad:   50.1 mmHg MV Peak grad:  33.9 mmHg             TR Vmax:        354.00 cm/s MV Mean grad:  16.0 mmHg MV Vmax:       2.91 m/s              SHUNTS MV Vmean:      180.0 cm/s            Systemic VTI:  1.15 m MV VTI:        0.52 m                Systemic Diam: 2.00 cm MV PHT:        73.37 msec MV Decel Time: 253 msec MV E velocity: 144.00 cm/s 103 cm/s MV A velocity: 232.00 cm/s 70.3 cm/s MV E/A ratio:  0.62        1.5  Kate Sable MD Electronically signed by Kate Sable MD Signature Date/Time: 02/22/2019/11:13:54 AM    Final     Scheduled Meds: . aspirin EC  81 mg Oral Daily  .  atorvastatin  40 mg Oral q1800  . feeding supplement (ENSURE ENLIVE)  237 mL Oral BID BM  . heparin  5,000 Units Subcutaneous Q8H  . mouth rinse  15 mL Mouth Rinse BID  . sodium chloride flush  3 mL Intravenous Once   Continuous Infusions: . sodium chloride 75 mL/hr at 02/22/19 0929  . cefTRIAXone (ROCEPHIN)  IV Stopped (02/21/19 2320)   PRN Meds:acetaminophen **OR** acetaminophen (TYLENOL) oral liquid 160 mg/5 mL **OR** acetaminophen, senna-docusate  Assessment/Plan: 1) generalized weakness and decreased p.o. intake: Patient is still too weak and has not had much p.o. intake since allowed by speech therapist with modified diet -Multiple etiologies including urinary tract infection and acute stroke -We will check urine drug screening, blood alcohol level --> both are negative -Also check TSH, 123456, folic acid --Continue with gentle IV hydration - Continue to encourage p.o. diet; consult dietitian -Add dietary supplement -Consult PT and OT for eval and treat and DC recommendation  2) suspected UTI: UA found to be  positive overnight -May also explain patient's recent past altered level of alertness -We'll continue empiric Rocephin -Follow urine culture and may switch based on susceptibility -Blood culture to follow-up  3) Acute ischemic stroke: With focal neurological deficit to the left lower extremity more than left upper extremity - MRI of the brain shows "Small acute infarcts of the deep left cerebral white matter. May reflect small vessel or watershed type infarcts." - Is not a candidate for TPA apart from the fact the patient is outside the TPA window -Status post aspirin loading dose on admission -Continue aspirin 81 mg and Plavix 75 mg for 3 weeks from admission followed by aspirin 81 mg daily -Continue statin -We will consult neurology -Troponin first set negative -Check thyroid panel  - hemoglobin A1c 7.8 and fasting lipid profile --> Appears to be close to normal range  - Check 2D echocardiogram:  1. Left ventricular ejection fraction, by visual estimation, is 70 to 75%. The left ventricle has hyperdynamic function. There is borderline left ventricular hypertrophy.  2. Left ventricular diastolic parameters are consistent with Grade II diastolic dysfunction (pseudonormalization).  3. The left ventricle has no regional wall motion abnormalities. -EEG --pending -Neuro check, seizure precaution, aspiration precaution -Passed swallow eval continue diet as per recommendation -PT and OT consultation; follow recommendation -Permissive hypertension for a total of 24 to 72 hours -Monitor on telemetry  CKD, stage IIIb: Appears to be stable based on creatinine and GFR for the past year -We will starts hydration -Avoid nephrotoxic agents -Monitor renal function  Dementia: History of dementia -Currently stable -Symptomatic management  Psych disorder: Stable  - history of bipolar and depression -But patient has not been on any medication -Currently denies any suicidal or homicidal thoughts  or ideas and hallucination -Conservative management  DVT Prophylaxis: Heparin Code Status: Full  Family Communication:  Spoke with daughter multiple times Disposition Plan: Per PT OT recommendation   LOS: 0 days   Numan Zylstra Izetta Dakin

## 2019-02-22 NOTE — Consult Note (Signed)
Referring Physician: Izetta Dakin    Chief Complaint: Weakness  HPI: Miranda Ryan is an 73 y.o. female who due to mental status is unable to provide any history therefore all history obtained from the chart.  Patient with significant past medical history for anxiety, bipolar, dementia, diabetes, urinary incontinence, who presented to the emergency department for the second time in 10 days on yesterday with generalized weakness and decreased p.o. intake.  Patient was in the ED on 01/30/2019 with altered mental status. According to the daughter, since patient was discharged from the ED she has not been doing well.  Also per daughter, patient has not been eating as much and was having trouble swallowing especially solid food.  Facial droop noted.  Patient brought back in for evaluation.  Initial NIHSS of 3.  Date last known well: Unable to determine Time last known well: Unable to determine tPA Given: No: Unable to determine LKW  Past Medical History:  Diagnosis Date  . Acute exacerbation of chronic obstructive pulmonary disease (COPD) ([place]) 04/20/2016  . Anxiety   . Bipolar disorder ([place])   . Dementia ([place])   . Depression   . Diabetes mellitus, type II ([place])   . Incontinence of urine     Past Surgical History:  Procedure Laterality Date  . CERVIX SURGERY      Family History  Problem Relation Age of Onset  . Liver cancer Mother   . Heart attack Father   . Heart disease Sister   . Heart disease Brother   . Heart disease Brother   . Heart disease Brother    Social History:  reports that she has been smoking cigarettes. She started smoking about 54 years ago. She has a 46.00 pack-year smoking history. She has never used smokeless tobacco. She reports previous alcohol use. She reports that she does not use drugs.  Allergies:  Allergies  Allergen Reactions  . Demerol [Meperidine] Other (See Comments)    Pt states she "turned green"     Medications:  I have reviewed the patient's current  medications. Prior to Admission:  Medications Prior to Admission  Medication Sig Dispense Refill Last Dose  . busPIRone (BUSPAR) 10 MG tablet Take 10 mg by mouth 2 (two) times daily.      Scheduled: . aspirin EC  81 mg Oral Daily  . atorvastatin  40 mg Oral q1800  . heparin  5,000 Units Subcutaneous Q8H  . mouth rinse  15 mL Mouth Rinse BID  . sodium chloride flush  3 mL Intravenous Once    ROS: Unable to provide due to mental status  Physical Examination: Blood pressure (!) 155/73, pulse 91, temperature 98 F (36.7 C), temperature source Oral, resp. rate 16, height 5\' 1"  (1.549 m), weight 65.8 kg, SpO2 100 %.  HEENT-  Normocephalic, no lesions, without obvious abnormality.  Normal external eye and conjunctiva.  Normal TM's bilaterally.  Normal auditory canals and external ears. Normal external nose, mucus membranes and septum.  Normal pharynx. Cardiovascular- S1, S2 normal, pulses palpable throughout   Lungs- chest clear, no wheezing, rales, normal symmetric air entry Abdomen- soft, non-tender; bowel sounds normal; no masses,  no organomegaly Extremities- no edema Lymph-no adenopathy palpable Musculoskeletal-no joint tenderness, deformity or swelling Skin-warm and dry, no hyperpigmentation, vitiligo, or suspicious lesions  Neurological Examination   Mental Status: Alert, oriented to name and that in hospital.  Reports it is 1984 and does not know the month.  Speech fluent.  Follows simple commands but requires  extensive reinforcement for 3-step commands.   Cranial Nerves: II: Visual fields grossly normal, pupils equal, round, reactive to light and accommodation III,IV, VI: ptosis not present, extra-ocular motions intact bilaterally V,VII: smile symmetric, facial light touch sensation normal bilaterally VIII: hearing normal bilaterally IX,X: gag reflex present XI: bilateral shoulder shrug XII: midline tongue extension Motor: Lifts all extremities weakly against gravity with  no focal weakness appreciated.  Patient does resist movement in the LLE due to pain. Sensory: Pinprick and light touch intact throughout, bilaterally Deep Tendon Reflexes: Symmetric throughout Plantars: Right: mute   Left: mute Cerebellar: No significant dysmetria noted though patient with difficulty following commands.   Gait: not tested due to safety concerns    Laboratory Studies:  Basic Metabolic Panel: Recent Labs  Lab 02/21/19 1038 02/21/19 1731 02/22/19 0349  NA 135  --  138  K 4.2  --  4.1  CL 98  --  105  CO2 23  --  23  GLUCOSE 276*  --  189*  BUN 38*  --  41*  CREATININE 1.46* 1.31* 1.27*  CALCIUM 9.3  --  8.3*  MG  --   --  2.0    Liver Function Tests: Recent Labs  Lab 02/21/19 1038  AST 24  ALT 25  ALKPHOS 130*  BILITOT 0.9  PROT 7.4  ALBUMIN 2.5*   No results for input(s): LIPASE, AMYLASE in the last 168 hours. No results for input(s): AMMONIA in the last 168 hours.  CBC: Recent Labs  Lab 02/21/19 1038 02/22/19 0349  WBC 17.0* 13.8*  NEUTROABS  --  10.6*  HGB 11.3* 10.0*  HCT 34.9* 32.3*  MCV 82.3 84.8  PLT 293 252    Cardiac Enzymes: No results for input(s): CKTOTAL, CKMB, CKMBINDEX, TROPONINI in the last 168 hours.  BNP: Invalid input(s): POCBNP  CBG: No results for input(s): GLUCAP in the last 168 hours.  Microbiology: Results for orders placed or performed during the hospital encounter of 02/21/19  Respiratory Panel by RT PCR (Flu A&B, Covid) - Nasopharyngeal Swab     Status: None   Collection Time: 02/21/19  5:17 PM   Specimen: Nasopharyngeal Swab  Result Value Ref Range Status   SARS Coronavirus 2 by RT PCR NEGATIVE NEGATIVE Final    Comment: (NOTE) SARS-CoV-2 target nucleic acids are NOT DETECTED. The SARS-CoV-2 RNA is generally detectable in upper respiratoy specimens during the acute phase of infection. The lowest concentration of SARS-CoV-2 viral copies this assay can detect is 131 copies/mL. A negative result does  not preclude SARS-Cov-2 infection and should not be used as the sole basis for treatment or other patient management decisions. A negative result may occur with  improper specimen collection/handling, submission of specimen other than nasopharyngeal swab, presence of viral mutation(s) within the areas targeted by this assay, and inadequate number of viral copies (<131 copies/mL). A negative result must be combined with clinical observations, patient history, and epidemiological information. The expected result is Negative. Fact Sheet for Patients:  PinkCheek.be Fact Sheet for Healthcare Providers:  GravelBags.it This test is not yet ap proved or cleared by the [place] FDA and  has been authorized for detection and/or diagnosis of SARS-CoV-2 by FDA under an Emergency Use Authorization (EUA). This EUA will remain  in effect (meaning this test can be used) for the duration of the COVID-19 declaration under Section 564(b)(1) of the Act, 21 U.S.C. section 360bbb-3(b)(1), unless the authorization is terminated or revoked sooner.    Influenza A by  PCR NEGATIVE NEGATIVE Final   Influenza B by PCR NEGATIVE NEGATIVE Final    Comment: (NOTE) The Xpert Xpress SARS-CoV-2/FLU/RSV assay is intended as an aid in  the diagnosis of influenza from Nasopharyngeal swab specimens and  should not be used as a sole basis for treatment. Nasal washings and  aspirates are unacceptable for Xpert Xpress SARS-CoV-2/FLU/RSV  testing. Fact Sheet for Patients: PinkCheek.be Fact Sheet for Healthcare Providers: GravelBags.it This test is not yet approved or cleared by the [place] FDA and  has been authorized for detection and/or diagnosis of SARS-CoV-2 by  FDA under an Emergency Use Authorization (EUA). This EUA will remain  in effect (meaning this test can be used) for the duration of the   Covid-19 declaration under Section 564(b)(1) of the Act, 21  U.S.C. section 360bbb-3(b)(1), unless the authorization is  terminated or revoked. Performed at [facility], [address] [place], [place] [address]   CULTURE, BLOOD (ROUTINE X 2) w Reflex to ID Panel     Status: None (Preliminary result)   Collection Time: 02/21/19  5:31 PM   Specimen: BLOOD  Result Value Ref Range Status   Specimen Description BLOOD BLOOD RIGHT FOREARM  Final   Special Requests   Final    BOTTLES DRAWN AEROBIC AND ANAEROBIC Blood Culture adequate volume   Culture   Final    NO GROWTH < 24 HOURS Performed at [facility], [address], [place], [place] [address]    Report Status PENDING  Incomplete  CULTURE, BLOOD (ROUTINE X 2) w Reflex to ID Panel     Status: None (Preliminary result)   Collection Time: 02/21/19  5:31 PM   Specimen: BLOOD  Result Value Ref Range Status   Specimen Description BLOOD BLOOD RIGHT WRIST  Final   Special Requests   Final    BOTTLES DRAWN AEROBIC AND ANAEROBIC Blood Culture results may not be optimal due to an inadequate volume of blood received in culture bottles   Culture   Final    NO GROWTH < 24 HOURS Performed at [facility], [address] [place], [place] [address]    Report Status PENDING  Incomplete    Coagulation Studies: No results for input(s): LABPROT, INR in the last 72 hours.  Urinalysis:  Recent Labs  Lab 02/21/19 2008  COLORURINE YELLOW*  LABSPEC 1.018  PHURINE 6.0  GLUCOSEU NEGATIVE  HGBUR SMALL*  BILIRUBINUR NEGATIVE  KETONESUR NEGATIVE  PROTEINUR 100*  NITRITE POSITIVE*  LEUKOCYTESUR MODERATE*    Lipid Panel:    Component Value Date/Time   CHOL 107 02/22/2019 0349   TRIG 115 02/22/2019 0349   HDL 19 (L) 02/22/2019 0349   CHOLHDL 5.6 02/22/2019 0349   VLDL 23 02/22/2019 0349   LDLCALC 65 02/22/2019 0349    HgbA1C:  Lab Results  Component Value Date   HGBA1C 7.8 (H) 02/22/2019    Urine  Drug Screen:      Component Value Date/Time   LABOPIA NONE DETECTED 02/21/2019 2008   COCAINSCRNUR NONE DETECTED 02/21/2019 2008   LABBENZ NONE DETECTED 02/21/2019 2008   AMPHETMU NONE DETECTED 02/21/2019 2008   THCU NONE DETECTED 02/21/2019 2008   LABBARB NONE DETECTED 02/21/2019 2008    Alcohol Level:  Recent Labs  Lab 02/21/19 1731  ETH <10    Imaging: DG Chest 2 View  Result Date: 02/21/2019 CLINICAL DATA:  Weakness EXAM: CHEST - 2 VIEW COMPARISON:  04/20/2016.  January 30, 2019. FINDINGS: The heart size and  mediastinal contours are within normal limits. Both lungs are clear. The visualized skeletal structures are unremarkable. IMPRESSION: No active cardiopulmonary disease. Electronically Signed   By: Constance Holster M.D.   On: 02/21/2019 16:03   MR BRAIN WO CONTRAST  Result Date: 02/21/2019 CLINICAL DATA:  Encephalopathy, worsening weakness EXAM: MRI HEAD WITHOUT CONTRAST TECHNIQUE: Multiplanar, multiecho pulse sequences of the brain and surrounding structures were obtained without intravenous contrast. COMPARISON:  None. FINDINGS: Brain: There is patchy reduced diffusion in the frontal lobe involving the corona radiata. There is no acute infarction or intracranial hemorrhage. There is no intracranial mass, mass effect, or edema. There is no hydrocephalus or extra-axial fluid collection. Prominence of the ventricles and sulci reflects mild generalized parenchymal volume loss. Relative ventricular prominence is likely on an ex vacuo basis. Patchy and confluent T2 hyperintensity in the supratentorial and pontine white matter is nonspecific but probably reflects moderate to marked chronic microvascular ischemic changes. Left anterior temporal encephalomalacia. Vascular: Major vessel flow voids at the skull base are preserved. Skull and upper cervical spine: Normal marrow signal is preserved. Sinuses/Orbits: Paranasal sinuses are aerated. Orbits are unremarkable. Other: Sella is  unremarkable.  Mastoid air cells are clear. IMPRESSION: Small acute infarcts of the deep left cerebral white matter. May reflect small vessel or watershed type infarcts. Moderate to marked chronic microvascular ischemic changes. Left anterior temporal encephalomalacia. Electronically Signed   By: Macy Mis M.D.   On: 02/21/2019 15:55   US Carotid Bilateral (at [facility] and [facility] only)  Result Date: 02/22/2019 CLINICAL DATA:  73 year old female with history of ischemic stroke EXAM: BILATERAL CAROTID DUPLEX ULTRASOUND TECHNIQUE: Pearline Cables scale imaging, color Doppler and duplex ultrasound were performed of bilateral carotid and vertebral arteries in the neck. COMPARISON:  None. FINDINGS: Criteria: Quantification of carotid stenosis is based on velocity parameters that correlate the residual internal carotid diameter with NASCET-based stenosis levels, using the diameter of the distal internal carotid lumen as the denominator for stenosis measurement. The following velocity measurements were obtained: RIGHT ICA:  Systolic XX123456 cm/sec, Diastolic 17 cm/sec CCA:  89 cm/sec SYSTOLIC ICA/CCA RATIO:  1.2 ECA:  131 cm/sec LEFT ICA:  Systolic XX123456 cm/sec, Diastolic 16 cm/sec CCA:  85 cm/sec SYSTOLIC ICA/CCA RATIO:  1.2 ECA:  124 cm/sec Right Brachial SBP: Not acquired Left Brachial SBP: Not acquired RIGHT CAROTID ARTERY: No significant calcifications of the right common carotid artery. Intermediate waveform maintained. Heterogeneous and partially calcified plaque at the right carotid bifurcation. No significant lumen shadowing. Low resistance waveform of the right ICA. No significant tortuosity. RIGHT VERTEBRAL ARTERY: Antegrade flow with low resistance waveform. LEFT CAROTID ARTERY: No significant calcifications of the left common carotid artery. Intermediate waveform maintained. Heterogeneous and partially calcified plaque at the left carotid bifurcation without significant lumen shadowing. Low resistance waveform of the left  ICA. No significant tortuosity. LEFT VERTEBRAL ARTERY:  Antegrade flow with low resistance waveform. IMPRESSION: Color duplex indicates moderate heterogeneous and calcified plaque, with no hemodynamically significant stenosis by duplex criteria in the extracranial cerebrovascular circulation. Signed, Dulcy Fanny. Dellia Nims, RPVI [facility] Radiology Electronically Signed   By: Corrie Mckusick D.O.   On: 02/22/2019 07:44    Assessment: 73 y.o. female witht past medical history for anxiety, bipolar, dementia, diabetes, urinary incontinence, who presented with complaints of generalized weakness and decreased p.o. intake.  MRI of the brain performed and shows small acute infarcts in the left cerebral white matter.  Concern is for large vessel to small vessel  embolism.  Carotid dopplers show no evidence of hemodynamically significant stenosis.  Echocardiogram pending.  A1c 7.8, LDL 65.  Patient on no antiplatelet therapy prior to admission. Folate normal, B12 pending  Stroke Risk Factors - diabetes mellitus and smoking  Plan: 1. PT consult, OT consult, Speech consult 2. Echocardiogram pending 3. Prophylactic therapy-Dual antiplatelet therapy with ASA 81mg  and Plavix 75mg  for three weeks with change to ASA 81mg  daily alone as monotherapy after that time. 4. Telemetry monitoring 5. Frequent neuro checks 6. Smoking cessation counseling 7. Blood sugar management with target A1c<7.0 8. Statin initiation 9. Thiamine level  Alexis Goodell, MD Neurology [phone] 02/22/2019, 10:13 AM

## 2019-02-22 NOTE — Evaluation (Signed)
Physical Therapy Evaluation Patient Details Name: Miranda Ryan MRN: [id number] DOB: [birth year] Today's Date: 02/22/2019   History of Present Illness  Pt is a 73 y.o. female presenting to hospital 02/21/19 with worsening generalized weakness.  Of note, pt with ED visit 12/8 for AMS (since then per chart pt has not been doing well; decreased eating; trouble swallowing; facial droop noted).  PMH includes bipolar disorder, dementia, DM, COPD, anxiety, urinary incontinence.  Clinical Impression  Prior to recent hospital encounters, pt was ambulatory (no AD); lives with family.  Pt resting in bed (daughter present) upon PT arrival.  Currently pt is mod to max assist with bed mobility; pt initially talking a little with therapist but then pt became quiet and started swaying and making repetitive hand movements; pt's daughter reports that is pt's way of demonstrating she is "overwhelmed".  Difficult to perform functional mobility d/t this and pt indicating that she wanted to go back to bed so pt assisted back to bed (pt requiring extra time to relax and 2nd assist to reposition in bed; pt appearing calm and comfortable end of session)  Pt would benefit from skilled PT to address noted impairments and functional limitations (see below for any additional details).  Upon hospital discharge, pt would benefit from STR.    Follow Up Recommendations SNF    Equipment Recommendations  Rolling walker with 5" wheels;3in1 (PT);Wheelchair (measurements PT);Wheelchair cushion (measurements PT)    Recommendations for Other Services OT consult     Precautions / Restrictions Precautions Precautions: Fall Precaution Comments: Seizure precautions; aspiration precautions Restrictions Weight Bearing Restrictions: No      Mobility  Bed Mobility Overal bed mobility: Needs Assistance Bed Mobility: Supine to Sit;Sit to Supine;Rolling Rolling: Mod assist   Supine to sit: Mod assist;Max assist;HOB elevated Sit to  supine: Max assist;HOB elevated   General bed mobility comments: assist for trunk and B LE's; 2 assist to boost up in bed; vc's for sequencing and initiation  Transfers                 General transfer comment: pt swaying sitting on edge bed and appearing very apprehensive (pt's daughter reports pt's actions indicating pt was overwhelmed) and pt indicating wanting to go back to bed so pt assisted back to bed  Ambulation/Gait             General Gait Details: Deferred  Stairs            Wheelchair Mobility    Modified Rankin (Stroke Patients Only)       Balance Overall balance assessment: Needs assistance Sitting-balance support: Feet unsupported;No upper extremity supported Sitting balance-Leahy Scale: Fair Sitting balance - Comments: initial posterior lean but improved to close SBA static sitting Postural control: Posterior lean                                   Pertinent Vitals/Pain Pain Assessment: Faces Faces Pain Scale: Hurts little more(with L LE movement) Pain Location: grimaces with L LE movement (unclear exact location of pain but pt's daughter reports pt with h/o this) Pain Descriptors / Indicators: Grimacing Pain Intervention(s): Limited activity within patient's tolerance;Monitored during session;Repositioned     Home Living Family/patient expects to be discharged to:: Private residence Living Arrangements: Children(pt's daughter, son in Sports [employer], and granddaughter) Available Help at Discharge: Family Type of Home: House Home Access: Stairs to enter Entrance Stairs-Rails: None Entrance Stairs-Number of  Steps: 1 Home Layout: One level Home Equipment: [place] - 2 wheels;Cane - single point Additional Comments: Pt is unreliable historian, reports living with dtr    Prior Function Level of Independence: Needs assistance         Comments: Prior to December 8th pt was ambulating and eating; increased difficulty with mobility since.   No recent falls reported.     Hand Dominance   Dominant Hand: Right    Extremity/Trunk Assessment   Upper Extremity Assessment Upper Extremity Assessment: Difficult to assess due to impaired cognition    Lower Extremity Assessment Lower Extremity Assessment: Difficult to assess due to impaired cognition    Cervical / Trunk Assessment Cervical / Trunk Assessment: Kyphotic  Communication   Communication: Other (comment)(HOH vs receptive impairment)  Cognition Arousal/Alertness: Awake/alert Behavior During Therapy: WFL for tasks assessed/performed Overall Cognitive Status: History of cognitive impairments - at baseline                                 General Comments: Alert and oriented to self; pt did not verbalize a lot during session but tended to nod head or point      General Comments   Nursing cleared pt for participation in physical therapy.  Pt agreeable to PT session.  Pt's daughter present during session.    Exercises     Assessment/Plan    PT Assessment Patient needs continued PT services  PT Problem List Decreased strength;Decreased activity tolerance;Decreased balance;Decreased mobility;Decreased cognition;Decreased knowledge of use of DME       PT Treatment Interventions DME instruction;Gait training;Stair training;Functional mobility training;Therapeutic activities;Therapeutic exercise;Balance training;Neuromuscular re-education;Patient/family education;Cognitive remediation    PT Goals (Current goals can be found in the Care Plan section)  Acute Rehab PT Goals Patient Stated Goal: go home PT Goal Formulation: With patient Time For Goal Achievement: 03/08/19 Potential to Achieve Goals: Fair    Frequency 7X/week   Barriers to discharge Decreased caregiver support(Level of assist)      Co-evaluation               AM-PAC PT "6 Clicks" Mobility  Outcome Measure Help needed turning from your back to your side while in a flat bed  without using bedrails?: A Lot Help needed moving from lying on your back to sitting on the side of a flat bed without using bedrails?: A Lot Help needed moving to and from a bed to a chair (including a wheelchair)?: A Lot Help needed standing up from a chair using your arms (e.g., wheelchair or bedside chair)?: A Lot Help needed to walk in hospital room?: Total Help needed climbing 3-5 steps with a railing? : Total 6 Click Score: 10    End of Session   Activity Tolerance: Other (comment)(Limited d/t pt appearing "overwhelmed") Patient left: in bed;with call bell/phone within reach;with bed alarm set;with nursing/sitter in room;with family/visitor present Nurse Communication: Mobility status;Precautions;Other (comment)(Nurse replaced purewick end of session) PT Visit Diagnosis: Other abnormalities of gait and mobility (R26.89);Muscle weakness (generalized) (M62.81);Difficulty in walking, not elsewhere classified (R26.2)    Time: [id number] PT Time Calculation (min) (ACUTE ONLY): 31 min   Charges:   PT Evaluation $PT Eval Low Complexity: 1 Low PT Treatments $Therapeutic Activity: 8-22 mins        Leitha Bleak, PT 02/22/19, 5:01 PM

## 2019-02-22 NOTE — Consult Note (Signed)
MEDICATION RELATED CONSULT NOTE - FOLLOW UP   Pharmacy Consult for Review of patient's prior to admission medications    Medications:  Medications Prior to Admission  Medication Sig Dispense Refill Last Dose  . busPIRone (BUSPAR) 10 MG tablet Take 10 mg by mouth 2 (two) times daily.      Scheduled:  . aspirin EC  81 mg Oral Daily  . atorvastatin  40 mg Oral q1800  . heparin  5,000 Units Subcutaneous Q8H  . mouth rinse  15 mL Mouth Rinse BID  . sodium chloride flush  3 mL Intravenous Once   Infusions:  . sodium chloride 75 mL/hr at 02/22/19 0929  . cefTRIAXone (ROCEPHIN)  IV Stopped (02/21/19 2320)    Assessment: Pharmacy to consulted to verify and update patient's home meds  Plan:  Prior to this admission patient was not currently taking any medications.   A review of patients PMH shows at one point patient was prescribed atorvastatin, but patient had stopped taking it. she was also taking buspirone twice daily at one point, but has not had that filled since 2019.   Pernell Dupre, PharmD, BCPS Clinical Pharmacist 02/22/2019 10:41 AM

## 2019-02-23 ENCOUNTER — Encounter: Payer: Self-pay | Admitting: Family Medicine

## 2019-02-23 DIAGNOSIS — R1310 Dysphagia, unspecified: Secondary | ICD-10-CM

## 2019-02-23 DIAGNOSIS — I050 Rheumatic mitral stenosis: Secondary | ICD-10-CM

## 2019-02-23 DIAGNOSIS — R7989 Other specified abnormal findings of blood chemistry: Secondary | ICD-10-CM

## 2019-02-23 HISTORY — DX: Rheumatic mitral stenosis: I05.0

## 2019-02-23 HISTORY — DX: Other specified abnormal findings of blood chemistry: R79.89

## 2019-02-23 HISTORY — DX: Dysphagia, unspecified: R13.10

## 2019-02-23 LAB — GLUCOSE, CAPILLARY
Glucose-Capillary: 360 mg/dL — ABNORMAL HIGH (ref 70–99)
Glucose-Capillary: 355 mg/dL — ABNORMAL HIGH (ref 70–99)
Glucose-Capillary: 328 mg/dL — ABNORMAL HIGH (ref 70–99)

## 2019-02-23 LAB — THYROID PANEL WITH TSH
Free Thyroxine Index: 3.1 (ref 1.2–4.9)
T3 Uptake Ratio: 39 % (ref 24–39)
T4, Total: 7.9 ug/dL (ref 4.5–12.0)
TSH: 0.008 u[IU]/mL — ABNORMAL LOW (ref 0.450–4.500)

## 2019-02-23 LAB — VITAMIN B12: Vitamin B-12: 518 pg/mL (ref 180–914)

## 2019-02-23 MED ORDER — CEPHALEXIN 500 MG PO CAPS
500.0000 mg | ORAL_CAPSULE | Freq: Four times a day (QID) | ORAL | Status: AC
  Administered 2019-02-23 – 2019-02-24 (×4): 500 mg via ORAL
  Filled 2019-02-23 (×4): qty 1

## 2019-02-23 MED ORDER — INSULIN ASPART 100 UNIT/ML ~~LOC~~ SOLN
0.0000 [IU] | Freq: Three times a day (TID) | SUBCUTANEOUS | Status: DC
  Administered 2019-02-23: 17:00:00 5 [IU] via SUBCUTANEOUS
  Filled 2019-02-23: qty 1

## 2019-02-23 MED ORDER — INSULIN ASPART 100 UNIT/ML ~~LOC~~ SOLN
0.0000 [IU] | Freq: Three times a day (TID) | SUBCUTANEOUS | Status: DC
  Administered 2019-02-23: 11 [IU] via SUBCUTANEOUS
  Administered 2019-02-24: 08:00:00 8 [IU] via SUBCUTANEOUS
  Administered 2019-02-24: 15 [IU] via SUBCUTANEOUS
  Administered 2019-02-24 – 2019-02-25 (×2): 8 [IU] via SUBCUTANEOUS
  Administered 2019-02-25: 3 [IU] via SUBCUTANEOUS
  Administered 2019-02-25: 22:00:00 5 [IU] via SUBCUTANEOUS
  Administered 2019-02-25: 8 [IU] via SUBCUTANEOUS
  Administered 2019-02-26: 17:00:00 5 [IU] via SUBCUTANEOUS
  Administered 2019-02-26: 12:00:00 3 [IU] via SUBCUTANEOUS
  Administered 2019-02-26 (×2): 5 [IU] via SUBCUTANEOUS
  Administered 2019-02-27: 11 [IU] via SUBCUTANEOUS
  Administered 2019-02-27: 09:00:00 3 [IU] via SUBCUTANEOUS
  Filled 2019-02-23 (×15): qty 1

## 2019-02-23 NOTE — Progress Notes (Signed)
Physical Therapy Treatment Patient Details Name: Miranda Ryan MRN: [id number] DOB: November 21, 1945 Today's Date: 02/23/2019    History of Present Illness Pt is a 74 y.o. female presenting to hospital 02/21/19 with worsening generalized weakness.  Of note, pt with ED visit 12/8 for AMS (since then per chart pt has not been doing well; decreased eating; trouble swallowing; facial droop noted).  PMH includes bipolar disorder, dementia, DM, COPD, anxiety, urinary incontinence.    PT Comments    Pt ready to get out of bed this am.  Pt puts in good effort but is unable to complete on her own.  She requries mod a x 1 to get to edge of bed but once up she is able to sit unsupported today.  She stands to walker with min/mod a x 1 with cues for hand placements and sequencing.  While she is able to stand today, she uses the  bed for support with post lean.  She is unable to step in place or pivot her feet to chair.  Returned to sitting and walker removed.  Stood with min a x 1 and is able to take some quality steps to recliner with her arms supported on mine.  Verbal cues and directions needed but does wll with transfer and remains in chair after session with RN in room.  She is pleased with her progress today.   Follow Up Recommendations  SNF     Equipment Recommendations  Rolling walker with 5" wheels;3in1 (PT);Wheelchair (measurements PT);Wheelchair cushion (measurements PT)    Recommendations for Other Services OT consult     Precautions / Restrictions Precautions Precautions: Fall Precaution Comments: Seizure precautions; aspiration precautions Restrictions Weight Bearing Restrictions: No    Mobility  Bed Mobility Overal bed mobility: Needs Assistance Bed Mobility: Supine to Sit     Supine to sit: Mod assist     General bed mobility comments: attempts to initiate but requries assist  Transfers Overall transfer level: Needs assistance Equipment used: Rolling walker (2  wheeled);None Transfers: Sit to/from [employer] to Stand: Min assist;Mod assist Stand pivot transfers: Min assist       General transfer comment: stood with walker on first attempt but is unable to step in place or move feet towards chair.  sat and walker removed.  stand pivot to recliner with min a x 1 and increased confidence to step to chair.  Ambulation/Gait Ambulation/Gait assistance: Min assist Gait Distance (Feet): 2 Feet Assistive device: 1 person hand held assist Gait Pattern/deviations: Step-to pattern Gait velocity: decreased   General Gait Details: able to take a few steps to chair during stand pivot tranfer but no true gait with walker.   Stairs             Wheelchair Mobility    Modified Rankin (Stroke Patients Only)       Balance Overall balance assessment: Needs assistance Sitting-balance support: Feet unsupported;No upper extremity supported Sitting balance-Leahy Scale: Fair Sitting balance - Comments: able to sit unsupported in sitting today with SBA   Standing balance support: Bilateral upper extremity supported Standing balance-Leahy Scale: Poor Standing balance comment: requires outside support for balance and support.                            Cognition Arousal/Alertness: Awake/alert Behavior During Therapy: WFL for tasks assessed/performed Overall Cognitive Status: Within Functional Limits for tasks assessed  Exercises      General Comments        Pertinent Vitals/Pain Pain Assessment: No/denies pain    Home Living                      Prior Function            PT Goals (current goals can now be found in the care plan section) Progress towards PT goals: Progressing toward goals    Frequency    7X/week      PT Plan Current plan remains appropriate    Co-evaluation              AM-PAC PT "6 Clicks" Mobility    Outcome Measure  Help needed turning from your back to your side while in a flat bed without using bedrails?: A Lot   Help needed moving to and from a bed to a chair (including a wheelchair)?: A Lot Help needed standing up from a chair using your arms (e.g., wheelchair or bedside chair)?: A Lot Help needed to walk in hospital room?: Total Help needed climbing 3-5 steps with a railing? : Total 6 Click Score: 8    End of Session Equipment Utilized During Treatment: Gait belt Activity Tolerance: Patient tolerated treatment well Patient left: in chair;with call bell/phone within reach;with chair alarm set Nurse Communication: Mobility status       Time: [id number] PT Time Calculation (min) (ACUTE ONLY): 12 min  Charges:  $Therapeutic Activity: 8-22 mins                     Chesley Noon, PTA 02/23/19, 10:51 AM

## 2019-02-23 NOTE — Progress Notes (Addendum)
Subjective: No new neurological complaints  Objective: Current vital signs: BP (!) 174/82 (BP Location: Right Arm)   Pulse 82   Temp 98 F (36.7 C) (Oral)   Resp 19   Ht 5\' 1"  (1.549 m)   Wt 65.8 kg   SpO2 100%   BMI 27.41 kg/m  Vital signs in last 24 hours: Temp:  [97.4 F (36.3 C)-100.5 F (38.1 C)] 98 F (36.7 C) (01/01 0757) Pulse Rate:  [81-116] 82 (01/01 0757) Resp:  [16-20] 19 (01/01 0757) BP: (102-174)/(68-93) 174/82 (01/01 0757) SpO2:  [96 %-100 %] 100 % (01/01 0757)  Intake/Output from previous day: 12/31 0701 - 01/01 0700 In: 1430 [I.V.:1430] Out: 300 [Urine:300] Intake/Output this shift: No intake/output data recorded. Nutritional status:  Diet Order            DIET - DYS 1 Room service appropriate? Yes with Assist; Fluid consistency: Thin  Diet effective now              Neurologic Exam: Mental Status: Alert and awake.  Follows simple commands but requires extensive reinforcement for 3-step commands.   Cranial Nerves: II: Visual fields grossly normal, pupils equal, round, reactive to light and accommodation III,IV, VI: ptosis not present, extra-ocular motions intact bilaterally V,VII: smile symmetric, facial light touch sensation normal bilaterally VIII: hearing normal bilaterally IX,X: gag reflex present XI: bilateral shoulder shrug XII: midline tongue extension Motor: Lifts all extremities weakly against gravity with no focal weakness appreciated.  Patient does resist movement in the LLE due to pain. Sensory: Pinprick and light touch intact throughout, bilaterally     Lab Results: Basic Metabolic Panel: Recent Labs  Lab 02/21/19 1038 02/21/19 1731 02/22/19 0349  NA 135  --  138  K 4.2  --  4.1  CL 98  --  105  CO2 23  --  23  GLUCOSE 276*  --  189*  BUN 38*  --  41*  CREATININE 1.46* 1.31* 1.27*  CALCIUM 9.3  --  8.3*  MG  --   --  2.0    Liver Function Tests: Recent Labs  Lab 02/21/19 1038  AST 24  ALT 25  ALKPHOS 130*   BILITOT 0.9  PROT 7.4  ALBUMIN 2.5*   No results for input(s): LIPASE, AMYLASE in the last 168 hours. No results for input(s): AMMONIA in the last 168 hours.  CBC: Recent Labs  Lab 02/21/19 1038 02/22/19 0349  WBC 17.0* 13.8*  NEUTROABS  --  10.6*  HGB 11.3* 10.0*  HCT 34.9* 32.3*  MCV 82.3 84.8  PLT 293 252    Cardiac Enzymes: No results for input(s): CKTOTAL, CKMB, CKMBINDEX, TROPONINI in the last 168 hours.  Lipid Panel: Recent Labs  Lab 02/22/19 0349  CHOL 107  TRIG 115  HDL 19*  CHOLHDL 5.6  VLDL 23  LDLCALC 65    CBG: No results for input(s): GLUCAP in the last 168 hours.  Microbiology: Results for orders placed or performed during the hospital encounter of 02/21/19  Respiratory Panel by RT PCR (Flu A&B, Covid) - Nasopharyngeal Swab     Status: None   Collection Time: 02/21/19  5:17 PM   Specimen: Nasopharyngeal Swab  Result Value Ref Range Status   SARS Coronavirus 2 by RT PCR NEGATIVE NEGATIVE Final    Comment: (NOTE) SARS-CoV-2 target nucleic acids are NOT DETECTED. The SARS-CoV-2 RNA is generally detectable in upper respiratoy specimens during the acute phase of infection. The lowest concentration of SARS-CoV-2 viral copies  this assay can detect is 131 copies/mL. A negative result does not preclude SARS-Cov-2 infection and should not be used as the sole basis for treatment or other patient management decisions. A negative result may occur with  improper specimen collection/handling, submission of specimen other than nasopharyngeal swab, presence of viral mutation(s) within the areas targeted by this assay, and inadequate number of viral copies (<131 copies/mL). A negative result must be combined with clinical observations, patient history, and epidemiological information. The expected result is Negative. Fact Sheet for Patients:  PinkCheek.be Fact Sheet for Healthcare Providers:   GravelBags.it This test is not yet ap proved or cleared by the [place] FDA and  has been authorized for detection and/or diagnosis of SARS-CoV-2 by FDA under an Emergency Use Authorization (EUA). This EUA will remain  in effect (meaning this test can be used) for the duration of the COVID-19 declaration under Section 564(b)(1) of the Act, 21 U.S.C. section 360bbb-3(b)(1), unless the authorization is terminated or revoked sooner.    Influenza A by PCR NEGATIVE NEGATIVE Final   Influenza B by PCR NEGATIVE NEGATIVE Final    Comment: (NOTE) The Xpert Xpress SARS-CoV-2/FLU/RSV assay is intended as an aid in  the diagnosis of influenza from Nasopharyngeal swab specimens and  should not be used as a sole basis for treatment. Nasal washings and  aspirates are unacceptable for Xpert Xpress SARS-CoV-2/FLU/RSV  testing. Fact Sheet for Patients: PinkCheek.be Fact Sheet for Healthcare Providers: GravelBags.it This test is not yet approved or cleared by the [place] FDA and  has been authorized for detection and/or diagnosis of SARS-CoV-2 by  FDA under an Emergency Use Authorization (EUA). This EUA will remain  in effect (meaning this test can be used) for the duration of the  Covid-19 declaration under Section 564(b)(1) of the Act, 21  U.S.C. section 360bbb-3(b)(1), unless the authorization is  terminated or revoked. Performed at [facility], [address] [place], [place] [address]   CULTURE, BLOOD (ROUTINE X 2) w Reflex to ID Panel     Status: None (Preliminary result)   Collection Time: 02/21/19  5:31 PM   Specimen: BLOOD  Result Value Ref Range Status   Specimen Description BLOOD BLOOD RIGHT FOREARM  Final   Special Requests   Final    BOTTLES DRAWN AEROBIC AND ANAEROBIC Blood Culture adequate volume   Culture   Final    NO GROWTH 2 DAYS Performed at [facility],  [address], [place], [place] [address]    Report Status PENDING  Incomplete  CULTURE, BLOOD (ROUTINE X 2) w Reflex to ID Panel     Status: None (Preliminary result)   Collection Time: 02/21/19  5:31 PM   Specimen: BLOOD  Result Value Ref Range Status   Specimen Description BLOOD BLOOD RIGHT WRIST  Final   Special Requests   Final    BOTTLES DRAWN AEROBIC AND ANAEROBIC Blood Culture results may not be optimal due to an inadequate volume of blood received in culture bottles   Culture   Final    NO GROWTH 2 DAYS Performed at [facility], [address] [place],  [address]    Report Status PENDING  Incomplete  Urine culture     Status: Abnormal   Collection Time: 02/21/19  8:08 PM   Specimen: Urine, Random  Result Value Ref Range Status   Specimen Description   Final    URINE, RANDOM Performed at [facility], [address] [place],  [place] [address]    Special Requests   Final    NONE Performed at [facility], [address] [place], [place] [address]    Culture MULTIPLE SPECIES PRESENT, SUGGEST RECOLLECTION (A)  Final   Report Status 02/22/2019 FINAL  Final    Coagulation Studies: No results for input(s): LABPROT, INR in the last 72 hours.  Imaging: DG Chest 2 View  Result Date: 02/21/2019 CLINICAL DATA:  Weakness EXAM: CHEST - 2 VIEW COMPARISON:  04/20/2016.  January 30, 2019. FINDINGS: The heart size and mediastinal contours are within normal limits. Both lungs are clear. The visualized skeletal structures are unremarkable. IMPRESSION: No active cardiopulmonary disease. Electronically Signed   By: Constance Holster M.D.   On: 02/21/2019 16:03   MR BRAIN WO CONTRAST  Result Date: 02/21/2019 CLINICAL DATA:  Encephalopathy, worsening weakness EXAM: MRI HEAD WITHOUT CONTRAST TECHNIQUE: Multiplanar, multiecho pulse sequences of the brain and surrounding structures were obtained without intravenous contrast. COMPARISON:  None.  FINDINGS: Brain: There is patchy reduced diffusion in the frontal lobe involving the corona radiata. There is no acute infarction or intracranial hemorrhage. There is no intracranial mass, mass effect, or edema. There is no hydrocephalus or extra-axial fluid collection. Prominence of the ventricles and sulci reflects mild generalized parenchymal volume loss. Relative ventricular prominence is likely on an ex vacuo basis. Patchy and confluent T2 hyperintensity in the supratentorial and pontine white matter is nonspecific but probably reflects moderate to marked chronic microvascular ischemic changes. Left anterior temporal encephalomalacia. Vascular: Major vessel flow voids at the skull base are preserved. Skull and upper cervical spine: Normal marrow signal is preserved. Sinuses/Orbits: Paranasal sinuses are aerated. Orbits are unremarkable. Other: Sella is unremarkable.  Mastoid air cells are clear. IMPRESSION: Small acute infarcts of the deep left cerebral white matter. May reflect small vessel or watershed type infarcts. Moderate to marked chronic microvascular ischemic changes. Left anterior temporal encephalomalacia. Electronically Signed   By: Macy Mis M.D.   On: 02/21/2019 15:55   US Carotid Bilateral (at [facility] and [facility] only)  Result Date: 02/22/2019 CLINICAL DATA:  74 year old female with history of ischemic stroke EXAM: BILATERAL CAROTID DUPLEX ULTRASOUND TECHNIQUE: Pearline Cables scale imaging, color Doppler and duplex ultrasound were performed of bilateral carotid and vertebral arteries in the neck. COMPARISON:  None. FINDINGS: Criteria: Quantification of carotid stenosis is based on velocity parameters that correlate the residual internal carotid diameter with NASCET-based stenosis levels, using the diameter of the distal internal carotid lumen as the denominator for stenosis measurement. The following velocity measurements were obtained: RIGHT ICA:  Systolic XX123456 cm/sec, Diastolic 17 cm/sec CCA:  89 cm/sec  SYSTOLIC ICA/CCA RATIO:  1.2 ECA:  131 cm/sec LEFT ICA:  Systolic XX123456 cm/sec, Diastolic 16 cm/sec CCA:  85 cm/sec SYSTOLIC ICA/CCA RATIO:  1.2 ECA:  124 cm/sec Right Brachial SBP: Not acquired Left Brachial SBP: Not acquired RIGHT CAROTID ARTERY: No significant calcifications of the right common carotid artery. Intermediate waveform maintained. Heterogeneous and partially calcified plaque at the right carotid bifurcation. No significant lumen shadowing. Low resistance waveform of the right ICA. No significant tortuosity. RIGHT VERTEBRAL ARTERY: Antegrade flow with low resistance waveform. LEFT CAROTID ARTERY: No significant calcifications of the left common carotid artery. Intermediate waveform maintained. Heterogeneous and partially calcified plaque at the left carotid bifurcation without significant lumen shadowing. Low resistance waveform of the left ICA. No significant tortuosity. LEFT VERTEBRAL ARTERY:  Antegrade flow with low resistance waveform. IMPRESSION: Color duplex indicates moderate heterogeneous and calcified plaque, with no  hemodynamically significant stenosis by duplex criteria in the extracranial cerebrovascular circulation. Signed, Dulcy Fanny. Dellia Nims, RPVI [facility] Electronically Signed   By: Corrie Mckusick D.O.   On: 02/22/2019 07:44   ECHOCARDIOGRAM COMPLETE  Result Date: 02/22/2019   ECHOCARDIOGRAM REPORT   Patient Name:   Miranda Ryan Date of Exam: 02/22/2019 Medical Rec #:  [id number]      Height:       61.0 in Accession #:    [id number]     Weight:       145.1 lb Date of Birth:  [birth year]       BSA:          1.65 m Patient Age:    74 years       BP:           155/73 mmHg Patient Gender: F              HR:           91 bpm. Exam Location:  [facility] Procedure: 2D Echo, Cardiac Doppler and Color Doppler Indications:     Stroke 434.91  History:         Patient has no prior history of Echocardiogram examinations.                   COPD; Risk Factors:Diabetes. Bi-polar disorder, dementia.  Sonographer:     Sherrie Sport RDCS (AE) Referring Phys:  [id number] TAWFIKUL ALAM Diagnosing Phys: Kate Sable MD IMPRESSIONS  1. Left ventricular ejection fraction, by visual estimation, is 70 to 75%. The left ventricle has hyperdynamic function. There is borderline left ventricular hypertrophy.  2. Left ventricular diastolic parameters are consistent with Grade II diastolic dysfunction (pseudonormalization).  3. The left ventricle has no regional wall motion abnormalities.  4. Global right ventricle has normal systolic function.The right ventricular size is normal. Right vetricular wall thickness was not assessed.  5. Left atrial size was normal.  6. Right atrial size was normal.  7. Severe calcification of the posterior mitral valve leaflet(s).  8. The mitral valve is degenerative. Mild mitral valve regurgitation. Severe mitral stenosis.  9. Mitral valve Mean gradient is 20mmHg at Heart rate of 97bmp. 10. The tricuspid valve is not well visualized. 11. The aortic valve was not well visualized. Aortic valve regurgitation is not visualized. 12. The pulmonic valve was not well visualized. Pulmonic valve regurgitation is not visualized. FINDINGS  Left Ventricle: Left ventricular ejection fraction, by visual estimation, is 70 to 75%. The left ventricle has hyperdynamic function. The left ventricle has no regional wall motion abnormalities. There is borderline left ventricular hypertrophy. Left ventricular diastolic parameters are consistent with Grade II diastolic dysfunction (pseudonormalization). Right Ventricle: The right ventricular size is normal. Right vetricular wall thickness was not assessed. Global RV systolic function is has normal systolic function. Left Atrium: Left atrial size was normal in size. Right Atrium: Right atrial size was normal in size Pericardium: There is no evidence of pericardial effusion. Mitral Valve: The mitral valve is  degenerative in appearance. There is severe calcification of the posterior mitral valve leaflet(s). Mild mitral valve regurgitation. Severe mitral valve stenosis by observation. MV peak gradient, 33.9 mmHg. Mitral valve Mean gradient is 70mmHg at Heart rate of 97bmp. Tricuspid Valve: The tricuspid valve is not well visualized. Tricuspid valve regurgitation is not demonstrated. Aortic Valve: The aortic valve was not well visualized. Aortic valve regurgitation is not visualized. Aortic valve mean gradient measures 44.3  mmHg. Aortic valve peak gradient measures 89.9 mmHg. Aortic valve area, by VTI measures 4.45 cm. Pulmonic Valve: The pulmonic valve was not well visualized. Pulmonic valve regurgitation is not visualized. Pulmonic regurgitation is not visualized. Aorta: The aortic root is normal in size and structure. Venous: The inferior vena cava was not well visualized. IAS/Shunts: No atrial level shunt detected by color flow Doppler.  LEFT VENTRICLE PLAX 2D LVIDd:         3.32 cm  Diastology LVIDs:         2.45 cm  LV e' lateral:   3.92 cm/s LV PW:         1.33 cm  LV E/e' lateral: 36.7 LV IVS:        1.24 cm  LV e' medial:    2.83 cm/s LVOT diam:     2.00 cm  LV E/e' medial:  50.9 LV SV:         24 ml LV SV Index:   13.84 LVOT Area:     3.14 cm  RIGHT VENTRICLE RV Basal diam:  2.63 cm RV S prime:     13.50 cm/s TAPSE (M-mode): 2.4 cm LEFT ATRIUM             Index       RIGHT ATRIUM          Index LA diam:        3.00 cm 1.82 cm/m  RA Area:     8.87 cm LA Vol (A2C):   56.3 ml 34.17 ml/m RA Volume:   16.10 ml 9.77 ml/m LA Vol (A4C):   32.6 ml 19.78 ml/m LA Biplane Vol: 47.3 ml 28.71 ml/m  AORTIC VALVE                    PULMONIC VALVE AV Area (Vmax):    3.73 cm     RVOT Peak grad: 15 mmHg AV Area (Vmean):   4.10 cm AV Area (VTI):     4.45 cm AV Vmax:           474.00 cm/s AV Vmean:          292.000 cm/s AV VTI:            0.812 m AV Peak Grad:      89.9 mmHg AV Mean Grad:      44.3 mmHg LVOT Vmax:          563.00 cm/s LVOT Vmean:        381.000 cm/s LVOT VTI:          1.150 m LVOT/AV VTI ratio: 1.42  AORTA Ao Root diam: 2.60 cm MITRAL VALVE                         TRICUSPID VALVE MV Area (PHT): 3.00 cm              TR Peak grad:   50.1 mmHg MV Peak grad:  33.9 mmHg             TR Vmax:        354.00 cm/s MV Mean grad:  16.0 mmHg MV Vmax:       2.91 m/s              SHUNTS MV Vmean:      180.0 cm/s            Systemic VTI:  1.15 m MV VTI:  0.52 m                Systemic Diam: 2.00 cm MV PHT:        73.37 msec MV Decel Time: 253 msec MV E velocity: 144.00 cm/s 103 cm/s MV A velocity: 232.00 cm/s 70.3 cm/s MV E/A ratio:  0.62        1.5  Kate Sable MD Electronically signed by Kate Sable MD Signature Date/Time: 02/22/2019/11:13:54 AM    Final     Medications:  I have reviewed the patient's current medications. Scheduled: . aspirin EC  81 mg Oral Daily  . atorvastatin  40 mg Oral q1800  . clopidogrel  75 mg Oral Daily  . feeding supplement (ENSURE ENLIVE)  237 mL Oral BID BM  . heparin  5,000 Units Subcutaneous Q8H  . mouth rinse  15 mL Mouth Rinse BID  . sodium chloride flush  3 mL Intravenous Once    Assessment/Plan: 74 y.o. female withtpast medical history for anxiety, bipolar, dementia, diabetes, urinary incontinence, who presented with complaints of generalized weakness and decreased p.o. intake. MRI of the brain performed and shows small acute infarcts in the left cerebral white matter.  Concern is for large vessel to small vessel embolism.  Carotid dopplers show no evidence of hemodynamically significant stenosis.  Echocardiogram reveals mitral valve calcifications with severe mitral stenosis, EF of 70-75%.  A1c 7.8, LDL 65.  Patient started on ASA, Plavix and statin.  Recommendations: 1. PT consult, OT consult, Speech therapy 2. Continue ASA, Plavix and statin 3. Telemetry monitoring 4. Cardiology to follow echo abnormalities     LOS: 1 day   Alexis Goodell,  MD Neurology [phone] 02/23/2019  8:58 AM

## 2019-02-23 NOTE — Progress Notes (Signed)
Nurse reports elevated CBG without bedtime coverage.  Patient with recent dx T2DM with HGB A1c 7.8, no prior treatments such as metforming etc. per EMR review. Likely patient with insulin resistance, SSI changed to moderate sensitivity for meals and bedtime coverage

## 2019-02-23 NOTE — Progress Notes (Signed)
PROGRESS NOTE  Miranda Ryan [id number] DOB: 1945-09-25 DOA: 02/21/2019 PCP: [place]  Brief History   74 year old woman presented with generalized weakness, decreased oral intake, difficulty swallowing, facial droop, difficulty ambulating.  MRI showed small acute infarcts, admitted for further evaluation.  A & P  Acute ischemic strokes with associated left upper and left lower extremity weakness, dysphagia, acute encephalopathy, generalized weakness, decreased oral intake.  Not a candidate for TPA given to unknown duration.  Concern for large vessel small vessel embolism.  Carotid Dopplers no evidence of significant stenosis.  LDL 65. --Appears to be better today.  No gross deficits noted. --Aspirin 81 mg and Plavix 75 mg for 3 weeks then aspirin 81 mg only --Continue statin --Stop smoking  Dysphagia --Continue diet per speech therapy, 12/31 this was dysphagia 1 thin liquids  TSH low, T4 within normal limits, other studies unremarkable. --Recheck TSH in outpatient setting  UTI --Afebrile, WBC trended down yesterday.  Change to oral antibiotic for 1 more day.  CKD stage IIIa with probable anemia of CKD --Both creatinine and anemia appear to be stable.  Follow-up as an outpatient.  Severe mitral stenosis, hyperdynamic LV --asymptomatic, can f/u with cardiology as an outpatient; I d/w daughter.  Diabetes mellitus type 2 --Hemoglobin A1c 7.8.  Sliding scale insulin.  Do not see that she is on any outpatient medications.  Would start Metformin in the outpatient setting.  Dementia, bipolar disorder, depression, not on any medication --Appears stable.  DVT prophylaxis: heparin Code Status: Full Family Communication: Discussed with daughter in detail Disposition Plan: SNF    Murray Hodgkins, MD  [facility] Direct contact: see [email or web address] (further directions at bottom of note if needed) 7PM-7AM contact night coverage as at bottom of note 02/23/2019, 2:25 PM   LOS: 1 day   Significant Hospital Events   . 12/30 admitted for acute stroke   Consults:  . Neurology   Procedures:  .   Significant Diagnostic Tests:  . Chest x-ray no acute disease . MRI brain small acute infarcts deep left cerebral white matter.  Small vascular watershed type infarcts. . 2D echocardiogram LVEF 70-75% with hyperdynamic function.  Borderline left ventricular hypertrophy.  Grade 2 diastolic dysfunction.  No wall motion abnormalities.  Mitral valve calcifications with severe mitral stenosis. . Carotid ultrasound without significant stenosis bilaterally   Micro Data:  .    Antimicrobials:  . Ceftriaxone 12/30 > 12/21 . Keflex 1/1  Interval History/Subjective  Feels better, no complaints.  Objective   Vitals:  Vitals:   02/23/19 0344 02/23/19 0757  BP: (!) 164/93 (!) 174/82  Pulse: 81 82  Resp: 20 19  Temp: 97.7 F (36.5 C) 98 F (36.7 C)  SpO2: 98% 100%    Exam:  Constitutional.  Appears calm, comfortable. Respiratory.  Clear to auscultation bilaterally.  No wheezes, rales or rhonchi.  Normal respiratory effort. Cardiovascular.  Regular rate and rhythm.  No murmur, rub or gallop.  No lower extremity edema. Telemetry SR. Psychiatric.  Grossly normal mood and affect.  Speech fluent and appropriate.  Oriented to self, location, thought it was New Year's Eve. Musculoskeletal.  Grossly normal tone and strength all extremities. Neurologic.  No pronator drift.  No dysdiadochokinesis of the upper extremities.  Face appears symmetric  I have personally reviewed the following:   Today's Data  . No new data   Scheduled Meds: . aspirin EC  81 mg Oral Daily  . atorvastatin  40 mg Oral q1800  . cephALEXin  500 mg Oral Q6H  . clopidogrel  75 mg Oral Daily  . feeding supplement (ENSURE ENLIVE)  237 mL Oral BID BM  . heparin  5,000 Units Subcutaneous Q8H  . insulin aspart  0-6 Units Subcutaneous TID WC  . mouth rinse  15 mL Mouth Rinse BID  . sodium  chloride flush  3 mL Intravenous Once   Continuous Infusions:   Principal Problem:   Acute CVA (cerebrovascular accident) ([place]) Active Problems:   Ischemic stroke diagnosed during current admission ([place])   Generalized weakness   Suspected UTI   Dysphagia   Low serum thyroid stimulating hormone (TSH)   Severe mitral valve stenosis   LOS: 1 day   How to contact the [facility] Attending or Consulting provider [place] or covering provider during after hours [place], for this patient?  1. Check the care team in [facility] and look for a) attending/consulting [facility] provider listed and b) the [facility] team listed 2. Log into [email or web address] and use [place]'s universal password to access. If you do not have the password, please contact the hospital operator. 3. Locate the [facility] provider you are looking for under [facility] and page to a number that you can be directly reached. 4. If you still have difficulty reaching the provider, please page the [facility] (Director on Call) for the Hospitalists listed on amion for assistance.

## 2019-02-24 LAB — MAGNESIUM: Magnesium: 1.9 mg/dL (ref 1.7–2.4)

## 2019-02-24 LAB — BASIC METABOLIC PANEL
Anion gap: 9 (ref 5–15)
BUN: 39 mg/dL — ABNORMAL HIGH (ref 8–23)
CO2: 22 mmol/L (ref 22–32)
Calcium: 8.2 mg/dL — ABNORMAL LOW (ref 8.9–10.3)
Chloride: 101 mmol/L (ref 98–111)
Creatinine, Ser: 1.09 mg/dL — ABNORMAL HIGH (ref 0.44–1.00)
GFR calc Af Amer: 58 mL/min — ABNORMAL LOW (ref >60)
GFR calc non Af Amer: 50 mL/min — ABNORMAL LOW (ref >60)
Glucose, Bld: 205 mg/dL — ABNORMAL HIGH (ref 70–99)
Potassium: 4.1 mmol/L (ref 3.5–5.1)
Sodium: 132 mmol/L — ABNORMAL LOW (ref 135–145)

## 2019-02-24 LAB — GLUCOSE, CAPILLARY
Glucose-Capillary: 100 mg/dL — ABNORMAL HIGH (ref 70–99)
Glucose-Capillary: 200 mg/dL — ABNORMAL HIGH (ref 70–99)
Glucose-Capillary: 265 mg/dL — ABNORMAL HIGH (ref 70–99)
Glucose-Capillary: 280 mg/dL — ABNORMAL HIGH (ref 70–99)
Glucose-Capillary: 355 mg/dL — ABNORMAL HIGH (ref 70–99)

## 2019-02-24 LAB — GLUCOSE, POCT (MANUAL RESULT ENTRY): POC Glucose: 280 mg/dl — AB (ref 70–99)

## 2019-02-24 MED ORDER — CLOPIDOGREL BISULFATE 75 MG PO TABS: 75.0000 mg | ORAL_TABLET | Freq: Every day | ORAL | Status: AC

## 2019-02-24 MED ORDER — METFORMIN HCL 500 MG PO TABS: 500.0000 mg | ORAL_TABLET | Freq: Two times a day (BID) | ORAL | 11 refills | Status: AC

## 2019-02-24 MED ORDER — ASPIRIN 81 MG PO TBEC: 81.0000 mg | DELAYED_RELEASE_TABLET | Freq: Every day | ORAL | Status: AC

## 2019-02-24 MED ORDER — ENSURE ENLIVE PO LIQD: 237.0000 mL | Freq: Two times a day (BID) | ORAL | Status: DC

## 2019-02-24 MED ORDER — ATORVASTATIN CALCIUM 40 MG PO TABS: 40.0000 mg | ORAL_TABLET | Freq: Every day | ORAL | Status: AC

## 2019-02-24 NOTE — Progress Notes (Signed)
Physical Therapy Treatment Patient Details Name: Miranda Ryan MRN: [id number] DOB: Jun 10, 1945 Today's Date: 02/24/2019    History of Present Illness Pt is a 74 y.o. female presenting to hospital 02/21/19 with worsening generalized weakness.  Of note, pt with ED visit 12/8 for AMS (since then per chart pt has not been doing well; decreased eating; trouble swallowing; facial droop noted).  PMH includes bipolar disorder, dementia, DM, COPD, anxiety, urinary incontinence.    PT Comments    Pt presented with deficits in strength, transfers, mobility, gait, balance, and activity tolerance.  Pt required mod A for BLE and trunk control during sup to/from sit along with heavy multimodal cuing for sequencing and for increased participation.  Pt initially required min-mod A to prevent R lateral lean in sitting at the EOB but improved after L lateral leaning activities to where pt was able to maintain upright sitting with SBA.  Pt was able to stand and take several small steps with the RW with assist and cues for sequencing. Pt will benefit from PT services in a SNF setting upon discharge to safely address above deficits for decreased caregiver assistance and eventual return to PLOF.     Follow Up Recommendations  SNF     Equipment Recommendations  Rolling walker with 5" wheels;3in1 (PT);Wheelchair (measurements PT);Wheelchair cushion (measurements PT)    Recommendations for Other Services       Precautions / Restrictions Precautions Precautions: Fall Precaution Comments: Seizure precautions; aspiration precautions Restrictions Weight Bearing Restrictions: No    Mobility  Bed Mobility Overal bed mobility: Needs Assistance Bed Mobility: Supine to Sit;Sit to Supine     Supine to sit: Mod assist Sit to supine: Mod assist   General bed mobility comments: Mod A for BLEs in/out of bed and for trunk control  Transfers Overall transfer level: Needs assistance Equipment used: Rolling walker (2  wheeled) Transfers: Sit to/from [facility] Sit to Stand: Min assist         General transfer comment: Mod verbal cues for sequencing and to initiate movement  Ambulation/Gait Ambulation/Gait assistance: Min assist Gait Distance (Feet): 2 Feet Assistive device: Rolling walker (2 wheeled) Gait Pattern/deviations: Step-to pattern;Trunk flexed;Decreased step length - right;Decreased step length - left Gait velocity: decreased   General Gait Details: Min A to guide the RW with max verbal cues to initiate movement   Stairs             Wheelchair Mobility    Modified Rankin (Stroke Patients Only)       Balance Overall balance assessment: Needs assistance Sitting-balance support: Feet unsupported;No upper extremity supported Sitting balance-Leahy Scale: Poor Sitting balance - Comments: R lateral lean that improved during the session Postural control: Right lateral lean Standing balance support: Bilateral upper extremity supported Standing balance-Leahy Scale: Poor Standing balance comment: requires outside support for balance and support.                            Cognition Arousal/Alertness: Awake/alert Behavior During Therapy: WFL for tasks assessed/performed Overall Cognitive Status: No family/caregiver present to determine baseline cognitive functioning                                        Exercises Total Joint Exercises Ankle Circles/Pumps: AROM;Both;10 reps;15 reps Quad Sets: Strengthening;Both;10 reps Short Arc Quad: AROM;AAROM;Both;10 reps;15 reps Heel Slides: AROM;AAROM;Both;10 reps;15  reps Hip ABduction/ADduction: AROM;AAROM;Both;10 reps;15 reps Straight Leg Raises: AROM;AAROM;Both;10 reps;15 reps Long Arc Quad: AROM;Both;10 reps;Strengthening Knee Flexion: AROM;Both;10 reps;Strengthening Other Exercises: Multiple sit to/from stand transfers from EOB Other Exercises: L lateral weight shifting activities in  sitting to address R lateral lean Other Exercises: Static sitting at EOB for increased activity tolerance and core strengthening    General Comments        Pertinent Vitals/Pain Pain Assessment: No/denies pain    Home Living                      Prior Function            PT Goals (current goals can now be found in the care plan section) Progress towards PT goals: Progressing toward goals    Frequency    7X/week      PT Plan Current plan remains appropriate    Co-evaluation              AM-PAC PT "6 Clicks" Mobility   Outcome Measure  Help needed turning from your back to your side while in a flat bed without using bedrails?: A Lot Help needed moving from lying on your back to sitting on the side of a flat bed without using bedrails?: A Lot Help needed moving to and from a bed to a chair (including a wheelchair)?: A Lot Help needed standing up from a chair using your arms (e.g., wheelchair or bedside chair)?: A Lot Help needed to walk in hospital room?: Total Help needed climbing 3-5 steps with a railing? : Total 6 Click Score: 10    End of Session Equipment Utilized During Treatment: Gait belt Activity Tolerance: Patient tolerated treatment well Patient left: in bed;with call bell/phone within reach;with bed alarm set Nurse Communication: Mobility status PT Visit Diagnosis: Other abnormalities of gait and mobility (R26.89);Muscle weakness (generalized) (M62.81);Difficulty in walking, not elsewhere classified (R26.2)     Time: [id number] PT Time Calculation (min) (ACUTE ONLY): 38 min  Charges:  $Therapeutic Exercise: 23-37 mins $Therapeutic Activity: 8-22 mins                     D. Scott Shatika Grinnell PT, DPT 02/24/19, 10:05 AM

## 2019-02-24 NOTE — Discharge Summary (Addendum)
Addendum: Pt is stable to d/c to SNF Pending family to submit their choice.    Physician Discharge Summary  Miranda Ryan [id number] DOB: 1945-11-04 DOA: 02/21/2019  PCP: [place] date: 02/21/2019   Discharge date: 02/27/2019  Recommendations for Outpatient Follow-up:  1. Follow-up CVA.  Please arrange follow-up after discharge from SNF, with neurology between 4 to 6 weeks from hospitalization, by mid February. 2. Finish Plavix 1/22.  Continue aspirin indefinitely. 3. Follow elevated blood blood pressure.  Labile here.  If remains elevated please initiate treatment.   4. Follow-up diabetes mellitus, started on Metformin, follow renal function intermittently.  Suggest repeat BMP approximately 1/9.  Sliding scale insulin recommended.  Consider second oral agent or insulin if needed.  Consider adding ACE inhibitor if renal function remains stable. 5. Follow-up depressed TSH.  Suggest repeat testing in the next 2 weeks. 6. Follow-up mitral stenosis, recommend outpatient follow-up with cardiology in 4-6 weeks.  Please arrange after discharge from SNF.  Follow-up Information    [place] Follow up.   Why: after SNF Contact information: [place] [address] [facility] Follow up.   Why: arrange on discharge from SNF. Follow-up with neurology 4-6 weeks (by mid-February) Contact information: [place] [phone]           Discharge Diagnoses: Principal diagnosis is #1 1. Acute ischemic strokes with associated left upper and lower extremity weakness, dysphagia, acute encephalopathy, generalized weakness, decreased oral intake 2. Dysphagia 3. TSH low, T4 within normal limits, other studies unremarkable. 4. UTI 5. 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 Severe mitral stenosis, hyperdynamic LV 7. Diabetes mellitus type  2 8. Dementia, bipolar disorder, depression  Discharge Condition: stable Disposition: short-term SNF  Diet recommendation:  Dysphagia level 1 (PUREE) w/ Thin liquids -- pt is w/out her Dentures currently. General aspiration precautions; Monitoring for any needs and encouragement during meals d/t Dementia  Medication Administration: Whole meds with puree(for safer, easier swallowing d/t Dementia)   Filed Weights   02/21/19 1034 02/21/19 2142  Weight: 65.8 kg 65.8 kg    History of present illness:  74 year old woman presented with generalized weakness, decreased oral intake, difficulty swallowing, facial droop, difficulty ambulating.  MRI showed small acute infarcts, admitted for further evaluation.  Hospital Course:  Patient was admitted for further evaluation of stroke.  Seen by neurology, therapy consultation.  Recommendations were for aspirin and Plavix.  Stroke evaluation was otherwise unremarkable.  Therapy recommended SNF.  Deficits on discharge left lower extremity weakness, consistent with ED examination.  I discussed with daughter 1/1.  Phone line was busy 1/2 despite multiple attempts.  Acute ischemic strokes with associated left upper and lower extremity weakness, dysphagia, acute encephalopathy, generalized weakness, decreased oral intake.  Not a candidate for TPA given to unknown duration.  Concern for large vessel small vessel embolism.  Carotid Dopplers no evidence of significant stenosis.  LDL 65. --Appears to be stable.  Left lower extremity weakness appears to be consistent with that documented by EDP and previous attending physician.  I suspect this will wax and wane.  No new deficits noted.   --Continue aspirin 81 mg and Plavix 75 mg for 3 weeks then aspirin 81 mg only --Continue statin --Stop smoking  Elevated blood pressure --Labile here, not clearly requiring therapy to reach goal.  Lisinopril initiated given CKD and  diabetes mellitus.  Monitor in the outpatient  setting and initiate treatment if indicated.  Dysphagia --Continue diet per speech therapy, 12/31 this was dysphagia 1 thin liquids  TSH low, T4 within normal limits, other studies unremarkable. --Recheck TSH in outpatient setting.  UTI --Completed antibiotics  CKD stage IIIa secondary to diabetes, with probable anemia of CKD --Stable.  No further inpatient evaluation.  Follow-up anemia as an outpatient as clinically indicated.  Consider starting lisinopril.  - encourage PO hydration   Severe mitral stenosis, hyperdynamic LV --asymptomatic, can f/u with cardiology as an outpatient; I d/w daughter 1/1.  Diabetes mellitus type 2 --Hemoglobin A1c 7.8.    Treated with sliding scale insulin.  Do not see that she is on any outpatient medications.  Would start Metformin in the outpatient setting and follow CBGs.  May need dual therapy.  Consider lisinopril if renal function remains stable.  Dementia, bipolar disorder, depression, not on any medication --Appears stable.  Significant Hospital Events    12/30 admitted for acute stroke  SNF recommended  Consults:   Neurology  Procedures:   None  Significant Diagnostic Tests:   Chest x-ray no acute disease  MRI brain small acute infarcts deep left cerebral white matter.  Small vascular watershed type infarcts.  2D echocardiogram LVEF 70-75% with hyperdynamic function.  Borderline left ventricular hypertrophy.  Grade 2 diastolic dysfunction.  No wall motion abnormalities.  Mitral valve calcifications with severe mitral stenosis.  Carotid ultrasound without significant stenosis bilaterally  Micro Data:   SARS-CoV-2 negative  Antimicrobials:   Ceftriaxone 12/30 > 12/31  Keflex 1/1   Today's assessment: S: Feels okay today, no complaints.  Addendum:  Pt has had uneventful nights. Says she is ready to get out of here. She is alert and oriented to time, place and person.  PEx: shows LL Ext weakness since  stroke  - Pt is stable to be discharged to SNF for further rehab  Vitals:  Vitals:   02/26/19 1528 02/27/19 0036  BP: 115/79 101/61  Pulse: (!) 111 (!) 116  Resp:  17  Temp: 98.6 F (37 C) 98.9 F (37.2 C)  SpO2: 98% 98%    Constitutional:  . Appears calm and comfortable; alert and oriented X 3  ENMT:  . grossly normal hearing  Respiratory:  . CTA bilaterally, no w/r/r.  . Respiratory effort normal.  Cardiovascular:  . RRR, no m/r/g . No LE extremity edema   . Telemetry reviewed, I do not see any run of SVT.  Sinus rhythm with some PVCs noted. Musculoskeletal:  . RUE, LUE grossly normal tone and strength. . Right lower extremity grossly normal tone and strength.  Left lower extremity weak, has difficulty flexing the and lifting leg off the bed.  Compared to EDP exam, this exam is similar.  Neurologic:  . LLExt weakness; No new deficits. Psychiatric:  . Mental status o Mood, affect appropriate  CBG elevated but stable Sodium mildly low at 132, BUN trending down at 39, creatinine unremarkable.  Magnesium within normal limits.  Discharge Instructions  Discharge Instructions    Call MD for:  extreme fatigue   Complete by: As directed    Call MD for:  persistant dizziness or light-headedness   Complete by: As directed    Diet - low sodium heart healthy   Complete by: As directed    Dys I diet per ST   Walk with assistance   Complete by: As directed      Allergies as of  02/27/2019      Reactions   Demerol [meperidine] Other (See Comments)   Pt states she "turned green"       Medication List    TAKE these medications   aspirin 81 MG EC tablet Take 1 tablet (81 mg total) by mouth daily.   atorvastatin 40 MG tablet Commonly known as: LIPITOR Take 1 tablet (40 mg total) by mouth daily at 6 PM.   busPIRone 10 MG tablet Commonly known as: BUSPAR Take 10 mg by mouth 2 (two) times daily.   clopidogrel 75 MG tablet Commonly known as: PLAVIX Take 1 tablet (75  mg total) by mouth daily for 19 days.   feeding supplement (ENSURE ENLIVE) Liqd Take 237 mLs by mouth 2 (two) times daily between meals.   metFORMIN 500 MG tablet Commonly known as: Glucophage Take 1 tablet (500 mg total) by mouth 2 (two) times daily with a meal.      Allergies  Allergen Reactions  . Demerol [Meperidine] Other (See Comments)    Pt states she "turned green"     The results of significant diagnostics from this hospitalization (including imaging, microbiology, ancillary and laboratory) are listed below for reference.    Significant Diagnostic Studies: DG Chest 2 View  Result Date: 02/21/2019 CLINICAL DATA:  Weakness EXAM: CHEST - 2 VIEW COMPARISON:  04/20/2016.  January 30, 2019. FINDINGS: The heart size and mediastinal contours are within normal limits. Both lungs are clear. The visualized skeletal structures are unremarkable. IMPRESSION: No active cardiopulmonary disease. Electronically Signed   By: Constance Holster M.D.   On: 02/21/2019 16:03   CT Head Wo Contrast  Result Date: 01/30/2019 CLINICAL DATA:  Altered mental status. EXAM: CT HEAD WITHOUT CONTRAST TECHNIQUE: Contiguous axial images were obtained from the base of the skull through the vertex without intravenous contrast. COMPARISON:  January 16, 2005. FINDINGS: Brain: Mild chronic ischemic white matter disease. No mass effect or midline shift is noted. Ventricular size is within normal limits. There is no evidence of mass lesion, hemorrhage or acute infarction. Vascular: No hyperdense vessel or unexpected calcification. Skull: Normal. Negative for fracture or focal lesion. Sinuses/Orbits: No acute finding. Other: None. IMPRESSION: Mild chronic ischemic white matter disease. No acute intracranial abnormality seen. Electronically Signed   By: Marijo Conception M.D.   On: 01/30/2019 15:22   MR BRAIN WO CONTRAST  Result Date: 02/21/2019 CLINICAL DATA:  Encephalopathy, worsening weakness EXAM: MRI HEAD WITHOUT  CONTRAST TECHNIQUE: Multiplanar, multiecho pulse sequences of the brain and surrounding structures were obtained without intravenous contrast. COMPARISON:  None. FINDINGS: Brain: There is patchy reduced diffusion in the frontal lobe involving the corona radiata. There is no acute infarction or intracranial hemorrhage. There is no intracranial mass, mass effect, or edema. There is no hydrocephalus or extra-axial fluid collection. Prominence of the ventricles and sulci reflects mild generalized parenchymal volume loss. Relative ventricular prominence is likely on an ex vacuo basis. Patchy and confluent T2 hyperintensity in the supratentorial and pontine white matter is nonspecific but probably reflects moderate to marked chronic microvascular ischemic changes. Left anterior temporal encephalomalacia. Vascular: Major vessel flow voids at the skull base are preserved. Skull and upper cervical spine: Normal marrow signal is preserved. Sinuses/Orbits: Paranasal sinuses are aerated. Orbits are unremarkable. Other: Sella is unremarkable.  Mastoid air cells are clear. IMPRESSION: Small acute infarcts of the deep left cerebral white matter. May reflect small vessel or watershed type infarcts. Moderate to marked chronic microvascular ischemic changes. Left anterior temporal encephalomalacia.  Electronically Signed   By: Macy Mis M.D.   On: 02/21/2019 15:55   US Carotid Bilateral (at [facility] and [facility] only)  Result Date: 02/22/2019 CLINICAL DATA:  74 year old female with history of ischemic stroke EXAM: BILATERAL CAROTID DUPLEX ULTRASOUND TECHNIQUE: Pearline Cables scale imaging, color Doppler and duplex ultrasound were performed of bilateral carotid and vertebral arteries in the neck. COMPARISON:  None. FINDINGS: Criteria: Quantification of carotid stenosis is based on velocity parameters that correlate the residual internal carotid diameter with NASCET-based stenosis levels, using the diameter of the distal internal carotid lumen as  the denominator for stenosis measurement. The following velocity measurements were obtained: RIGHT ICA:  Systolic XX123456 cm/sec, Diastolic 17 cm/sec CCA:  89 cm/sec SYSTOLIC ICA/CCA RATIO:  1.2 ECA:  131 cm/sec LEFT ICA:  Systolic XX123456 cm/sec, Diastolic 16 cm/sec CCA:  85 cm/sec SYSTOLIC ICA/CCA RATIO:  1.2 ECA:  124 cm/sec Right Brachial SBP: Not acquired Left Brachial SBP: Not acquired RIGHT CAROTID ARTERY: No significant calcifications of the right common carotid artery. Intermediate waveform maintained. Heterogeneous and partially calcified plaque at the right carotid bifurcation. No significant lumen shadowing. Low resistance waveform of the right ICA. No significant tortuosity. RIGHT VERTEBRAL ARTERY: Antegrade flow with low resistance waveform. LEFT CAROTID ARTERY: No significant calcifications of the left common carotid artery. Intermediate waveform maintained. Heterogeneous and partially calcified plaque at the left carotid bifurcation without significant lumen shadowing. Low resistance waveform of the left ICA. No significant tortuosity. LEFT VERTEBRAL ARTERY:  Antegrade flow with low resistance waveform. IMPRESSION: Color duplex indicates moderate heterogeneous and calcified plaque, with no hemodynamically significant stenosis by duplex criteria in the extracranial cerebrovascular circulation. Signed, Dulcy Fanny. Dellia Nims, RPVI [facility] Electronically Signed   By: Corrie Mckusick D.O.   On: 02/22/2019 07:44   DG Chest Portable 1 View  Result Date: 01/30/2019 CLINICAL DATA:  Patient presents for cough and ams. Patient is non verbal at this time. Hx of dm, copd, dementia.cough EXAM: PORTABLE CHEST 1 VIEW COMPARISON:  07/16/2017 FINDINGS: The heart size and mediastinal contours are within normal limits. Both lungs are clear. The visualized skeletal structures are unremarkable. IMPRESSION: No active disease. Electronically Signed   By: Nolon Nations M.D.   On: 01/30/2019 15:32   ECHOCARDIOGRAM COMPLETE  Result Date: 02/22/2019   ECHOCARDIOGRAM REPORT   Patient Name:   Miranda Ryan Date of Exam: 02/22/2019 Medical Rec #:  [id number]      Height:       61.0 in Accession #:    [id number]     Weight:       145.1 lb Date of Birth:  04-Sep-1945       BSA:          1.65 m Patient Age:    49 years       BP:           155/73 mmHg Patient Gender: F              HR:           91 bpm. Exam Location:  [facility] Procedure: 2D Echo, Cardiac Doppler and Color Doppler Indications:     Stroke 434.91  History:         Patient has no prior history of Echocardiogram examinations.                  COPD; Risk Factors:Diabetes. Bi-polar disorder, dementia.  Sonographer:     Sherrie Sport RDCS (  AE) Referring Phys:  [id number] Jayon Matton Diagnosing Phys: Kate Sable MD IMPRESSIONS  1. Left ventricular ejection fraction, by visual estimation, is 70 to 75%. The left ventricle has hyperdynamic function. There is borderline left ventricular hypertrophy.  2. Left ventricular diastolic parameters are consistent with Grade II diastolic dysfunction (pseudonormalization).  3. The left ventricle has no regional wall motion abnormalities.  4. Global right ventricle has normal systolic function.The right ventricular size is normal. Right vetricular wall thickness was not assessed.  5. Left atrial size was normal.  6. Right atrial size was normal.  7. Severe calcification of the posterior mitral valve leaflet(s).  8. The mitral valve is degenerative. Mild mitral valve regurgitation. Severe mitral stenosis.  9. Mitral valve Mean gradient is 48mmHg at Heart rate of 97bmp. 10. The tricuspid valve is not well visualized. 11. The aortic valve was not well visualized. Aortic valve regurgitation is not visualized. 12. The pulmonic valve was not well visualized. Pulmonic valve regurgitation is not visualized. FINDINGS  Left Ventricle: Left ventricular ejection fraction, by visual estimation, is 70  to 75%. The left ventricle has hyperdynamic function. The left ventricle has no regional wall motion abnormalities. There is borderline left ventricular hypertrophy. Left ventricular diastolic parameters are consistent with Grade II diastolic dysfunction (pseudonormalization). Right Ventricle: The right ventricular size is normal. Right vetricular wall thickness was not assessed. Global RV systolic function is has normal systolic function. Left Atrium: Left atrial size was normal in size. Right Atrium: Right atrial size was normal in size Pericardium: There is no evidence of pericardial effusion. Mitral Valve: The mitral valve is degenerative in appearance. There is severe calcification of the posterior mitral valve leaflet(s). Mild mitral valve regurgitation. Severe mitral valve stenosis by observation. MV peak gradient, 33.9 mmHg. Mitral valve Mean gradient is 81mmHg at Heart rate of 97bmp. Tricuspid Valve: The tricuspid valve is not well visualized. Tricuspid valve regurgitation is not demonstrated. Aortic Valve: The aortic valve was not well visualized. Aortic valve regurgitation is not visualized. Aortic valve mean gradient measures 44.3 mmHg. Aortic valve peak gradient measures 89.9 mmHg. Aortic valve area, by VTI measures 4.45 cm. Pulmonic Valve: The pulmonic valve was not well visualized. Pulmonic valve regurgitation is not visualized. Pulmonic regurgitation is not visualized. Aorta: The aortic root is normal in size and structure. Venous: The inferior vena cava was not well visualized. IAS/Shunts: No atrial level shunt detected by color flow Doppler.  LEFT VENTRICLE PLAX 2D LVIDd:         3.32 cm  Diastology LVIDs:         2.45 cm  LV e' lateral:   3.92 cm/s LV PW:         1.33 cm  LV E/e' lateral: 36.7 LV IVS:        1.24 cm  LV e' medial:    2.83 cm/s LVOT diam:     2.00 cm  LV E/e' medial:  50.9 LV SV:         24 ml LV SV Index:   13.84 LVOT Area:     3.14 cm  RIGHT VENTRICLE RV Basal diam:  2.63 cm RV  S prime:     13.50 cm/s TAPSE (M-mode): 2.4 cm LEFT ATRIUM             Index       RIGHT ATRIUM          Index LA diam:        3.00 cm 1.82 cm/m  RA Area:     8.87 cm LA Vol (A2C):   56.3 ml 34.17 ml/m RA Volume:   16.10 ml 9.77 ml/m LA Vol (A4C):   32.6 ml 19.78 ml/m LA Biplane Vol: 47.3 ml 28.71 ml/m  AORTIC VALVE                    PULMONIC VALVE AV Area (Vmax):    3.73 cm     RVOT Peak grad: 15 mmHg AV Area (Vmean):   4.10 cm AV Area (VTI):     4.45 cm AV Vmax:           474.00 cm/s AV Vmean:          292.000 cm/s AV VTI:            0.812 m AV Peak Grad:      89.9 mmHg AV Mean Grad:      44.3 mmHg LVOT Vmax:         563.00 cm/s LVOT Vmean:        381.000 cm/s LVOT VTI:          1.150 m LVOT/AV VTI ratio: 1.42  AORTA Ao Root diam: 2.60 cm MITRAL VALVE                         TRICUSPID VALVE MV Area (PHT): 3.00 cm              TR Peak grad:   50.1 mmHg MV Peak grad:  33.9 mmHg             TR Vmax:        354.00 cm/s MV Mean grad:  16.0 mmHg MV Vmax:       2.91 m/s              SHUNTS MV Vmean:      180.0 cm/s            Systemic VTI:  1.15 m MV VTI:        0.52 m                Systemic Diam: 2.00 cm MV PHT:        73.37 msec MV Decel Time: 253 msec MV E velocity: 144.00 cm/s 103 cm/s MV A velocity: 232.00 cm/s 70.3 cm/s MV E/A ratio:  0.62        1.5  Kate Sable MD Electronically signed by Kate Sable MD Signature Date/Time: 02/22/2019/11:13:54 AM    Final     Microbiology: Recent Results (from the past 240 hour(s))  Respiratory Panel by RT PCR (Flu A&B, Covid) - Nasopharyngeal Swab     Status: None   Collection Time: 02/21/19  5:17 PM   Specimen: Nasopharyngeal Swab  Result Value Ref Range Status   SARS Coronavirus 2 by RT PCR NEGATIVE NEGATIVE Final    Comment: (NOTE) SARS-CoV-2 target nucleic acids are NOT DETECTED. The SARS-CoV-2 RNA is generally detectable in upper respiratoy specimens during the acute phase of infection. The lowest concentration of SARS-CoV-2 viral  copies this assay can detect is 131 copies/mL. A negative result does not preclude SARS-Cov-2 infection and should not be used as the sole basis for treatment or other patient management decisions. A negative result may occur with  improper specimen collection/handling, submission of specimen other than nasopharyngeal swab, presence of viral mutation(s) within the areas targeted by this assay, and inadequate number of viral copies (<131 copies/mL). A negative  result must be combined with clinical observations, patient history, and epidemiological information. The expected result is Negative. Fact Sheet for Patients:  PinkCheek.be Fact Sheet for Healthcare Providers:  GravelBags.it This test is not yet ap proved or cleared by the [place] FDA and  has been authorized for detection and/or diagnosis of SARS-CoV-2 by FDA under an Emergency Use Authorization (EUA). This EUA will remain  in effect (meaning this test can be used) for the duration of the COVID-19 declaration under Section 564(b)(1) of the Act, 21 U.S.C. section 360bbb-3(b)(1), unless the authorization is terminated or revoked sooner.    Influenza A by PCR NEGATIVE NEGATIVE Final   Influenza B by PCR NEGATIVE NEGATIVE Final    Comment: (NOTE) The Xpert Xpress SARS-CoV-2/FLU/RSV assay is intended as an aid in  the diagnosis of influenza from Nasopharyngeal swab specimens and  should not be used as a sole basis for treatment. Nasal washings and  aspirates are unacceptable for Xpert Xpress SARS-CoV-2/FLU/RSV  testing. Fact Sheet for Patients: PinkCheek.be Fact Sheet for Healthcare Providers: GravelBags.it This test is not yet approved or cleared by the [place] FDA and  has been authorized for detection and/or diagnosis of SARS-CoV-2 by  FDA under an Emergency Use Authorization (EUA). This EUA will  remain  in effect (meaning this test can be used) for the duration of the  Covid-19 declaration under Section 564(b)(1) of the Act, 21  U.S.C. section 360bbb-3(b)(1), unless the authorization is  terminated or revoked. Performed at [facility], [address] [place], [place] [address]   CULTURE, BLOOD (ROUTINE X 2) w Reflex to ID Panel     Status: None   Collection Time: 02/21/19  5:31 PM   Specimen: BLOOD  Result Value Ref Range Status   Specimen Description BLOOD BLOOD RIGHT FOREARM  Final   Special Requests   Final    BOTTLES DRAWN AEROBIC AND ANAEROBIC Blood Culture adequate volume   Culture   Final    NO GROWTH 5 DAYS Performed at [facility], [address] [place], [place] [address]    Report Status 02/26/2019 FINAL  Final  CULTURE, BLOOD (ROUTINE X 2) w Reflex to ID Panel     Status: None   Collection Time: 02/21/19  5:31 PM   Specimen: BLOOD  Result Value Ref Range Status   Specimen Description BLOOD BLOOD RIGHT WRIST  Final   Special Requests   Final    BOTTLES DRAWN AEROBIC AND ANAEROBIC Blood Culture results may not be optimal due to an inadequate volume of blood received in culture bottles   Culture   Final    NO GROWTH 5 DAYS Performed at [facility], [address], [place], [place] [address]    Report Status 02/26/2019 FINAL  Final  Urine culture     Status: Abnormal   Collection Time: 02/21/19  8:08 PM   Specimen: Urine, Random  Result Value Ref Range Status   Specimen Description   Final    URINE, RANDOM Performed at [facility], [address], [place], [place] [address]    Special Requests   Final    NONE Performed at [facility], [address] [place], [place] [address]    Culture MULTIPLE SPECIES PRESENT, SUGGEST RECOLLECTION (A)  Final   Report Status 02/22/2019 FINAL  Final  Respiratory Panel by RT PCR (Flu A&B, Covid) - Nasopharyngeal Swab     Status: None   Collection Time: 02/26/19   2:46  PM   Specimen: Nasopharyngeal Swab  Result Value Ref Range Status   SARS Coronavirus 2 by RT PCR NEGATIVE NEGATIVE Final    Comment: (NOTE) SARS-CoV-2 target nucleic acids are NOT DETECTED. The SARS-CoV-2 RNA is generally detectable in upper respiratoy specimens during the acute phase of infection. The lowest concentration of SARS-CoV-2 viral copies this assay can detect is 131 copies/mL. A negative result does not preclude SARS-Cov-2 infection and should not be used as the sole basis for treatment or other patient management decisions. A negative result may occur with  improper specimen collection/handling, submission of specimen other than nasopharyngeal swab, presence of viral mutation(s) within the areas targeted by this assay, and inadequate number of viral copies (<131 copies/mL). A negative result must be combined with clinical observations, patient history, and epidemiological information. The expected result is Negative. Fact Sheet for Patients:  PinkCheek.be Fact Sheet for Healthcare Providers:  GravelBags.it This test is not yet ap proved or cleared by the [place] FDA and  has been authorized for detection and/or diagnosis of SARS-CoV-2 by FDA under an Emergency Use Authorization (EUA). This EUA will remain  in effect (meaning this test can be used) for the duration of the COVID-19 declaration under Section 564(b)(1) of the Act, 21 U.S.C. section 360bbb-3(b)(1), unless the authorization is terminated or revoked sooner.    Influenza A by PCR NEGATIVE NEGATIVE Final   Influenza B by PCR NEGATIVE NEGATIVE Final    Comment: (NOTE) The Xpert Xpress SARS-CoV-2/FLU/RSV assay is intended as an aid in  the diagnosis of influenza from Nasopharyngeal swab specimens and  should not be used as a sole basis for treatment. Nasal washings and  aspirates are unacceptable for Xpert Xpress SARS-CoV-2/FLU/RSV   testing. Fact Sheet for Patients: PinkCheek.be Fact Sheet for Healthcare Providers: GravelBags.it This test is not yet approved or cleared by the [place] FDA and  has been authorized for detection and/or diagnosis of SARS-CoV-2 by  FDA under an Emergency Use Authorization (EUA). This EUA will remain  in effect (meaning this test can be used) for the duration of the  Covid-19 declaration under Section 564(b)(1) of the Act, 21  U.S.C. section 360bbb-3(b)(1), unless the authorization is  terminated or revoked. Performed at [facility], [address] [place], [place] [address]      Labs: Basic Metabolic Panel: Recent Labs  Lab 02/21/19 1038 02/21/19 1731 02/22/19 0349 02/24/19 0006 02/25/19 1143  NA 135  --  138 132* 132*  K 4.2  --  4.1 4.1 4.1  CL 98  --  105 101 98  CO2 23  --  23 22 23   GLUCOSE 276*  --  189* 205* 292*  BUN 38*  --  41* 39* 42*  CREATININE 1.46* 1.31* 1.27* 1.09* 1.23*  CALCIUM 9.3  --  8.3* 8.2* 8.2*  MG  --   --  2.0 1.9  --    Liver Function Tests: Recent Labs  Lab 02/21/19 1038  AST 24  ALT 25  ALKPHOS 130*  BILITOT 0.9  PROT 7.4  ALBUMIN 2.5*   CBC: Recent Labs  Lab 02/21/19 1038 02/22/19 0349  WBC 17.0* 13.8*  NEUTROABS  --  10.6*  HGB 11.3* 10.0*  HCT 34.9* 32.3*  MCV 82.3 84.8  PLT 293 252   CBG: Recent Labs  Lab 02/25/19 2048 02/26/19 0743 02/26/19 1137 02/26/19 1650 02/26/19 2053  GLUCAP 218* 226* 180* 222* 228*    Principal Problem:   Acute CVA (cerebrovascular  accident) [facility]) Active Problems:   Ischemic stroke diagnosed during current admission ([place])   Generalized weakness   Suspected UTI   Dysphagia   Low serum thyroid stimulating hormone (TSH)   Severe mitral valve stenosis   Time coordinating discharge: 35 minutes  Signed:  Murray Hodgkins, MD  [facility]  02/27/2019, 7:49 AM

## 2019-02-24 NOTE — Significant Event (Signed)
Called to see patient, concern for change in condition.  Daughter at bedside, quite concerned about her mother status, weak left leg.  Patient denies pain  Afebrile, vital signs stable  Patient appears calm, mildly uncomfortable, ill but not toxic.  She is alert and answer simple questions appropriately, she recognizes her daughter.  Her voice sounds weak but there is no dysarthria.  Tone and strength bilateral upper extremities is unchanged and there is no upper extremity dysdiadochokinesis.  Right lower extremity has good tone and strength which appears to be normal.  The left lower extremity has excellent tone, she is now able to lift it off the bed, she resists flexing at the hip and the knee, there is excellent quadriceps strength.  Cardiovascular regular rate and rhythm.  No murmur, rub or gallop.  Respiratory is clear to auscultation bilaterally, there is no wheezes, rales or rhonchi.  Respiratory effort appears normal but she is mildly tachypneic.  Long discussion with daughter at bedside.  Patient appears worse compared to this morning, less interactive and appears quite fatigued, tired.  We discussed in detail her neurologic exam which is grossly unchanged from this morning with the exception of improved strength in the left leg.  We discussed the nature of stroke including waxing and waning of symptoms, symptoms complicated by dementia, fatigue.  I am concerned that the patient looks worse, and daughter dilates at length by the stress she has been under.  Mother has lived with her over the last 2 years, overall her depression, dementia, bipolar disorder have been relatively stable, patient had been able to do most ADLs although she did have developing confusion.  Things change drastically December 8, which is when daughter thinks the patient had a stroke.  Since that time mentally she has worsened and her mental status has been variable, use of the legs has been variable although strength seems to  have been intact at home.  At this point do not think the patient stable enough for discharge, we discussed that symptoms may wax or wane, or may progress.  We discussed further interventions, and daughter does not wish to pursue any further testing.  Will manage conservatively.  She does not want her mother to suffer and requests DNR status for her mother.  She reports her brother was in alignment with this although seem to be having difficulty with that decision.  We discussed observing overnight, condition improved, discharged to skilled facility with MOST form which the daughter will review this evening.  Should her condition fail to improve or worseni, hospice would be appropriate.  We discussed home versus residential.  Advance care planning note as above.  Discussed stroke, mental status, dementia, bipolar disorder.  Discussed current clinical status.  Changed to DNR.  MOST form to be completed tomorrow.  Murray Hodgkins, MD [facility] [phone]

## 2019-02-24 NOTE — Plan of Care (Signed)
  Problem: Education: Goal: Knowledge of disease or condition will improve Outcome: Not Progressing Goal: Knowledge of secondary prevention will improve Outcome: Not Progressing   Problem: Coping: Goal: Will verbalize positive feelings about self Outcome: Not Progressing   Problem: Self-Care: Goal: Ability to communicate needs accurately will improve Outcome: Not Progressing   Problem: Nutrition: Goal: Risk of aspiration will decrease Outcome: Not Progressing   Problem: Ischemic Stroke/TIA Tissue Perfusion: Goal: Complications of ischemic stroke/TIA will be minimized Outcome: Not Progressing   Problem: Education: Goal: Knowledge of General Education information will improve Description: Including pain rating scale, medication(s)/side effects and non-pharmacologic comfort measures Outcome: Not Progressing   Problem: Clinical Measurements: Goal: Ability to maintain clinical measurements within normal limits will improve Outcome: Not Progressing Goal: Will remain free from infection Outcome: Not Progressing Goal: Diagnostic test results will improve Outcome: Not Progressing Goal: Respiratory complications will improve Outcome: Not Progressing Goal: Cardiovascular complication will be avoided Outcome: Not Progressing   Problem: Pain Managment: Goal: General experience of comfort will improve Outcome: Not Progressing   Problem: Safety: Goal: Ability to remain free from injury will improve Outcome: Not Progressing  Pt's medical history includes dementia

## 2019-02-24 NOTE — Progress Notes (Signed)
Patient with a run of SVT for a few minute per telemetry, hospitalist notified, BMP and magnesium labs ordered. Will continue to monitor.

## 2019-02-25 LAB — BASIC METABOLIC PANEL
Anion gap: 11 (ref 5–15)
BUN: 42 mg/dL — ABNORMAL HIGH (ref 8–23)
CO2: 23 mmol/L (ref 22–32)
Calcium: 8.2 mg/dL — ABNORMAL LOW (ref 8.9–10.3)
Chloride: 98 mmol/L (ref 98–111)
Creatinine, Ser: 1.23 mg/dL — ABNORMAL HIGH (ref 0.44–1.00)
GFR calc Af Amer: 50 mL/min — ABNORMAL LOW (ref >60)
GFR calc non Af Amer: 43 mL/min — ABNORMAL LOW (ref >60)
Glucose, Bld: 292 mg/dL — ABNORMAL HIGH (ref 70–99)
Potassium: 4.1 mmol/L (ref 3.5–5.1)
Sodium: 132 mmol/L — ABNORMAL LOW (ref 135–145)

## 2019-02-25 LAB — GLUCOSE, CAPILLARY
Glucose-Capillary: 167 mg/dL — ABNORMAL HIGH (ref 70–99)
Glucose-Capillary: 288 mg/dL — ABNORMAL HIGH (ref 70–99)
Glucose-Capillary: 273 mg/dL — ABNORMAL HIGH (ref 70–99)
Glucose-Capillary: 218 mg/dL — ABNORMAL HIGH (ref 70–99)

## 2019-02-25 LAB — VITAMIN B1: Vitamin B1 (Thiamine): 77.5 nmol/L (ref 66.5–200.0)

## 2019-02-25 NOTE — Plan of Care (Signed)
  Problem: Education: Goal: Knowledge of disease or condition will improve Outcome: Not Progressing Goal: Knowledge of secondary prevention will improve Outcome: Not Progressing   Problem: Coping: Goal: Will verbalize positive feelings about self Outcome: Not Progressing   Problem: Self-Care: Goal: Ability to communicate needs accurately will improve Outcome: Not Progressing   Problem: Nutrition: Goal: Risk of aspiration will decrease Outcome: Not Progressing   Problem: Ischemic Stroke/TIA Tissue Perfusion: Goal: Complications of ischemic stroke/TIA will be minimized Outcome: Not Progressing   Problem: Education: Goal: Knowledge of General Education information will improve Description: Including pain rating scale, medication(s)/side effects and non-pharmacologic comfort measures Outcome: Not Progressing   Problem: Clinical Measurements: Goal: Ability to maintain clinical measurements within normal limits will improve Outcome: Not Progressing Goal: Will remain free from infection Outcome: Not Progressing Goal: Diagnostic test results will improve Outcome: Not Progressing Goal: Respiratory complications will improve Outcome: Not Progressing Goal: Cardiovascular complication will be avoided Outcome: Not Progressing   Problem: Pain Managment: Goal: General experience of comfort will improve Outcome: Not Progressing   Problem: Safety: Goal: Ability to remain free from injury will improve Outcome: Not Progressing   Problem: Education: Goal: Knowledge of disease or condition will improve Outcome: Not Progressing Goal: Knowledge of secondary prevention will improve Outcome: Not Progressing   Problem: Coping: Goal: Will verbalize positive feelings about self Outcome: Not Progressing   Problem: Self-Care: Goal: Ability to communicate needs accurately will improve Outcome: Not Progressing   Problem: Nutrition: Goal: Risk of aspiration will decrease Outcome: Not  Progressing   Problem: Ischemic Stroke/TIA Tissue Perfusion: Goal: Complications of ischemic stroke/TIA will be minimized Outcome: Not Progressing   Problem: Education: Goal: Knowledge of General Education information will improve Description: Including pain rating scale, medication(s)/side effects and non-pharmacologic comfort measures Outcome: Not Progressing   Problem: Clinical Measurements: Goal: Ability to maintain clinical measurements within normal limits will improve Outcome: Not Progressing Goal: Will remain free from infection Outcome: Not Progressing Goal: Diagnostic test results will improve Outcome: Not Progressing Goal: Respiratory complications will improve Outcome: Not Progressing Goal: Cardiovascular complication will be avoided Outcome: Not Progressing   Problem: Pain Managment: Goal: General experience of comfort will improve Outcome: Not Progressing   Problem: Safety: Goal: Ability to remain free from injury will improve Outcome: Not Progressing

## 2019-02-25 NOTE — Progress Notes (Signed)
NP Randol Kern notified of increasing Welsh score from 6 on the previous shift to a 9. Will continue to frequently monitor, no new orders given.

## 2019-02-25 NOTE — Progress Notes (Signed)
PROGRESS NOTE  Miranda Ryan [id number] DOB: 1945-05-13 DOA: 02/21/2019 PCP: Secretary  Brief History   74 year old woman presented with generalized weakness, decreased oral intake, difficulty swallowing, facial droop, difficulty ambulating.  MRI showed small acute infarcts, admitted for further evaluation.  A & P  Acute ischemic strokes with associated left upper and left lower extremity weakness, dysphagia, acute encephalopathy, generalized weakness, decreased oral intake.  - Much alert and oriented; no new deficit  - Not a candidate for TPA given to unknown duration of S/S.   - Concern for large vessel small vessel embolism.  Carotid Dopplers no evidence of significant stenosis.  LDL 65. --Aspirin 81 mg and Plavix 75 mg for 3 weeks then aspirin 81 mg only --Continue statin --Stop smoking  Dysphagia --Continue diet per speech therapy, 12/31 this was dysphagia 1 thin liquids  TSH low, T4 within normal limits, other studies unremarkable. --Recheck TSH in outpatient setting  UTI --Afebrile, WBC trended down yesterday.  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  Follow-up as an outpatient.  Severe mitral stenosis, hyperdynamic LV --asymptomatic, can f/u with cardiology as an outpatient; I d/w daughter.  Diabetes mellitus type 2 --Hemoglobin A1c 7.8.  Sliding scale insulin.  Do not see that she is on any outpatient medications.   - Would start Metformin in the outpatient setting.  Dementia, bipolar disorder, depression, not on any medication --Appears stable.  Disposition: Pt was supposed to be discharged to SNF today.  - PT recommended SNF  -  Daughter was given the list of place. Daughter is yet to decide. CM working on it - Anticipated D/C tomorrow   DVT prophylaxis: heparin Code Status: Full Family Communication: Discussed with daughter in  detail Disposition Plan: SNF    Thornell Mule, MD  [facility] Direct contact: see [email or web address] (further directions at bottom of note if needed) 7PM-7AM contact night coverage as at bottom of note 02/25/2019, 8:33 PM  LOS: 3 days   Significant Hospital Events   . 12/30 admitted for acute stroke   Consults:  . Neurology   Procedures:  .   Significant Diagnostic Tests:  . Chest x-ray no acute disease . MRI brain small acute infarcts deep left cerebral white matter.  Small vascular watershed type infarcts. . 2D echocardiogram LVEF 70-75% with hyperdynamic function.  Borderline left ventricular hypertrophy.  Grade 2 diastolic dysfunction.  No wall motion abnormalities.  Mitral valve calcifications with severe mitral stenosis. . Carotid ultrasound without significant stenosis bilaterally   Micro Data:  .    Antimicrobials:  . Ceftriaxone 12/30 > 12/21 . Keflex 1/1  Interval History/Subjective  Was lying on bed. Alert and oriented X3.   Objective   Vitals:  Vitals:   02/25/19 1550 02/25/19 1943  BP: 125/77 129/79  Pulse: (!) 110 (!) 107  Resp: 18 18  Temp: 98.5 F (36.9 C) 98.5 F (36.9 C)  SpO2: 100% 98%    Exam:  Constitutional.  Appears calm, comfortable. Respiratory.  Clear to auscultation bilaterally.  No wheezes, rales or rhonchi.  Normal respiratory effort. Cardiovascular.  Regular rate and rhythm.  No murmur, rub or gallop.  No lower extremity edema. Telemetry SR. Psychiatric.  Grossly normal mood and affect.  Speech fluent and appropriate.  Oriented to self, location, thought it was New Year's Eve. Musculoskeletal.  Grossly normal tone and strength all extremities. Neurologic.  No pronator  drift.  No dysdiadochokinesis of the upper extremities.  Face appears symmetric. LLExt weakness 2/4.   I have personally reviewed the following:   Today's Data  . No new data   Scheduled Meds: . aspirin EC  81 mg Oral Daily  . atorvastatin  40 mg Oral q1800  .  clopidogrel  75 mg Oral Daily  . feeding supplement (ENSURE ENLIVE)  237 mL Oral BID BM  . heparin  5,000 Units Subcutaneous Q8H  . insulin aspart  0-15 Units Subcutaneous TID AC & HS  . mouth rinse  15 mL Mouth Rinse BID  . sodium chloride flush  3 mL Intravenous Once   Continuous Infusions:   Principal Problem:   Acute CVA (cerebrovascular accident) ([place]) Active Problems:   Ischemic stroke diagnosed during current admission ([place])   Generalized weakness   Suspected UTI   Dysphagia   Low serum thyroid stimulating hormone (TSH)   Severe mitral valve stenosis   LOS: 3 days   How to contact the [facility] Attending or Consulting provider [place] or covering provider during after hours [place], for this patient?  1. Check the care team in [facility] and look for a) attending/consulting TRH provider listed and b) the [facility] team listed 2. Log into [email or web address] and use [place]'s universal password to access. If you do not have the password, please contact the hospital operator. 3. Locate the [facility] provider you are looking for under [facility] and page to a number that you can be directly reached. 4. If you still have difficulty reaching the provider, please page the [facility] (Director on Call) for the Hospitalists listed on amion for assistance.

## 2019-02-25 NOTE — Social Work (Signed)
TOC CM/SW consult in progress.  Left message for family contact to call.    Berenice Bouton, MSW, LCSW  [phone] 8am-6pm

## 2019-02-25 NOTE — Progress Notes (Signed)
In previous assessments patient was reluctant to participate, Patient more cooperative this assessment.

## 2019-02-25 NOTE — TOC Initial Note (Signed)
Transition of Care [facility]) - Initial/Assessment Note    Patient Details  Name: Miranda Ryan MRN: [id number] Date of Birth: 03/11/1945  Transition of Care [facility]) CM/SW Contact:    Berenice Bouton, LCSW Phone Number: 02/25/2019, 4:36 PM  Clinical Narrative:  The is a 74 y.o. female presented to [facility] ED w/generalize weakness. Pt has a history anxiety, bipolar, dementia, diabetes, and urinary incontinence.  TOC social worker met with the patient's daughter at the patient's [facility] bedside.  Discussed with daughter and patient physical therapist recommendation for SNF. Daughter agreed to recommendation. Daughter provide with Medicare healthcare provider's compare list. The patient's daughter explained that her mother lives with her and she is her mother's sole caretaker.   Would like time to look over the list.    Expected Discharge Plan: Skilled Nursing Facility Barriers to Discharge: SNF Pending bed offer  Patient Goals and CMS Choice Patient states their goals for this hospitalization and ongoing recovery are:: Daughter, Miranda Ryan [phone] wants her mother to get stronger. Agreed to SNF search CMS Medicare.gov Compare Post Acute Care list provided to:: Patient Represenative (must comment) Choice offered to / list presented to : Adult Children, Patient  Expected Discharge Plan and Services Expected Discharge Plan: [place] In-house Referral: Clinical Social Work   Post Acute Care Choice: [place] Living arrangements for the past 2 months: [place] Expected Discharge Date: 02/25/19                   Prior Living Arrangements/Services Living arrangements for the past 2 months: [place] Lives with:: Adult Children(Daughter, Miranda Ryan) Patient language and need for interpreter reviewed:: No Do you feel safe going back to the place where you live?: Yes      Need for Family Participation in Patient Care: Yes (Comment)(Pt  diagnose w/dementia) Care giver support system in place?: Yes (comment)(Daughter)   Criminal Activity/Legal Involvement Pertinent to Current Situation/Hospitalization: No - Comment as needed  Activities of Daily Living Home Assistive Devices/Equipment: None ADL Screening (condition at time of admission) Patient's cognitive ability adequate to safely complete daily activities?: No Is the patient deaf or have difficulty hearing?: No Does the patient have difficulty seeing, even when wearing glasses/contacts?: No Does the patient have difficulty concentrating, remembering, or making decisions?: Yes Patient able to express need for assistance with ADLs?: Yes Does the patient have difficulty dressing or bathing?: Yes Independently performs ADLs?: No Communication: Needs assistance Is this a change from baseline?: Pre-admission baseline Dressing (OT): Needs assistance Is this a change from baseline?: Pre-admission baseline Grooming: Needs assistance Is this a change from baseline?: Change from baseline, expected to last <3 days Feeding: Independent Bathing: Needs assistance Is this a change from baseline?: Pre-admission baseline Toileting: Needs assistance Is this a change from baseline?: Change from baseline, expected to last <3 days In/Out Bed: Needs assistance Is this a change from baseline?: Pre-admission baseline Walks in Home: Needs assistance Is this a change from baseline?: Change from baseline, expected to last <3 days Does the patient have difficulty walking or climbing stairs?: Yes Weakness of Legs: Both Weakness of Arms/Hands: None  Permission Sought/Granted Permission sought to share information with : Case Manager, Customer service manager, Family Supports Permission granted to share information with : Yes, Verbal Permission Granted  Share Information with NAME: Miranda Ryan, daughter, [phone]  Permission granted to share info w AGENCY: skills nursing  facilities       Emotional Assessment Appearance:: Appears older  than stated age Attitude/Demeanor/Rapport: Other (comment)(calm) Affect (typically observed): Calm Orientation: : Oriented to Self Alcohol / Substance Use: Not Applicable Psych Involvement: No (comment)  Admission diagnosis:  Generalized weakness [R53.1] Acute CVA (cerebrovascular accident) ([place]) [I63.9] Ischemic stroke diagnosed during current admission [facility]) [I63.9] Acute ischemic stroke [facility]) [I63.9] Patient Active Problem List   Diagnosis Date Noted  . Dysphagia 02/23/2019  . Low serum thyroid stimulating hormone (TSH) 02/23/2019  . Severe mitral valve stenosis 02/23/2019  . Acute CVA (cerebrovascular accident) ([place]) 02/22/2019  . Generalized weakness   . Suspected UTI   . Ischemic stroke diagnosed during current admission ([place]) 02/21/2019  . Acute exacerbation of chronic obstructive pulmonary disease (COPD) ([place]) 04/20/2016   PCP:  [place] [facility]:   [facility] [place], [place] - [address] [place] [address] Phone: [phone] Fax: [phone]  [id number] [address], [place] [address] [place] [address] Phone: [phone] Fax: [phone]  Social Determinants of Health (SDOH) Interventions    Readmission Risk Interventions No flowsheet data found.

## 2019-02-25 NOTE — Discharge Instructions (Signed)
Cognitive Rehabilitation After a Stroke After a stroke, you may have various problems with thinking (cognitive disability). The types of problems you have will depend on how severe the stroke was and where it was located in the brain. Problems may include:  Problems with short-term memory.  Trouble paying attention.  Trouble communicating or understanding language (aphasia).  A drop in mental ability that may interfere with daily life (dementia).  Trouble with problem-solving and information processing.  Problems with reading, writing, or math.  Problems with your ability to plan and to perform activities in sequence (executive function). These problems can feel overwhelming. However, with rehabilitation and time to heal, many people have improvement in their symptoms. What causes cognitive disability? A stroke happens when blood cannot flow to certain areas of the brain. When this happens, brain cells die in the affected areas because they cannot get oxygen and nutrients from the blood. Cognitive disability is caused by the death of cells in the areas of the brain that control thinking. What is cognitive rehabilitation? Cognitive rehabilitation is a program to help you improve your thinking skills after a stroke. Rehabilitation cannot completely reverse the effects of a stroke, but it can help you with memory, problem-solving, and communication skills. Therapy focuses on:  Improving brain function. This may involve activities such as learning to break down tasks into simple steps.  Helping you learn ways to cope with thinking problems. For example, you might learn memory tricks or do activities that stimulate memory, such as naming objects or describing pictures. Cognitive rehabilitation may include:  Speech-language therapy to help you understand and use language to communicate.  Occupational therapy to help you perform daily activities.  Music therapy to help relieve stress,  anxiety, and depression. This may involve listening to music, singing, or playing instruments.  Physical therapy to help improve your ability to move and perform actions that involve the muscles (motor functions). When will therapy start and where will I have therapy? Your health care provider will decide when it is best for you to start therapy. In some cases, people start rehabilitation as soon as their health is stable, which may be 24-48 hours after the stroke. Rehabilitation can take place in a few different places, based on your needs. It may take place in:  The hospital or an in-patient rehabilitation hospital.  An outpatient rehabilitation facility.  A long-term care facility.  A community rehabilitation clinic.  Your home. What are some tools to help after a stroke? There are a number of tools and apps that you can use on your smartphone, personal computer, or tablet to help improve brain function. Some of these apps include:  Calendar reminders or alarm apps to help with memory.  Note-taking or sketch pad apps to help with memory or communication.  Text-to-speech apps that allow you to listen to what you are reading, which helps your ability to understanding text.  Picture dictionary or picture message apps to help with communication.  E-readers. These can highlight text as it is read aloud, which helps with listening and reading skills. How can my friends or family help during my rehabilitation? During your recovery, it is important that your friends and family members help you work toward more independence. Your caregivers should speak with your health care providers to learn how they can best help you during recovery. This may include working on speech-language or memory exercises at home, or helping with daily tasks and errands. If you have cognitive disability, you may   be at risk for injury or accidents at home, such as forgetting to turn off the stove. Friends and family  members can help ensure home safety by taking steps such as getting appliances with automatic shut-off features or storing dangerous objects in a secure place. What else should I know about cognitive rehabilitation after a stroke? Having trouble with memory and problem-solving can make you feel alone. You may also have mood changes, anxiety, or depression after a stroke. It is important to:  Stay connected with others through social groups, online support groups, or your community.  Talk to your friends, family, and caregivers about any emotional problems you are having.  Go to one-on-one or group therapy as suggested by your health care provider.  Stay physically active and exercise as often as suggested by your health care provider. Summary  After a stroke, some people have problems with thinking that affect attention, memory, language, communication, and problem-solving.  Cognitive rehabilitation is a program to help you regain brain function and learn skills to cope with thinking problems.  Rehabilitation cannot completely reverse the effects of a stroke, but it can help to improve quality of life.  Cognitive rehabilitation may include speech-language therapy, occupational therapy, music therapy, and physical therapy. This information is not intended to replace advice given to you by your health care provider. Make sure you discuss any questions you have with your health care provider. Document Revised: 05/31/2018 Document Reviewed: 05/14/2016 Elsevier Patient Education  2020 Elsevier Inc.  

## 2019-02-26 LAB — GLUCOSE, CAPILLARY
Glucose-Capillary: 226 mg/dL — ABNORMAL HIGH (ref 70–99)
Glucose-Capillary: 180 mg/dL — ABNORMAL HIGH (ref 70–99)
Glucose-Capillary: 222 mg/dL — ABNORMAL HIGH (ref 70–99)

## 2019-02-26 LAB — CULTURE, BLOOD (ROUTINE X 2)
Culture: NO GROWTH
Culture: NO GROWTH
Special Requests: ADEQUATE

## 2019-02-26 LAB — RESPIRATORY PANEL BY RT PCR (FLU A&B, COVID)
Influenza A by PCR: NEGATIVE
Influenza B by PCR: NEGATIVE
SARS Coronavirus 2 by RT PCR: NEGATIVE

## 2019-02-26 MED ORDER — SODIUM CHLORIDE 0.9 % IV SOLN: INTRAVENOUS | Status: DC

## 2019-02-26 NOTE — Care Management Important Message (Signed)
Important Message  Patient Details  Name: Miranda Ryan MRN: [id number] Date of Birth: 11-Nov-1945   Medicare Important Message Given:  Yes     Juliann Pulse A Tony Friscia 02/26/2019, 11:40 AM

## 2019-02-26 NOTE — TOC Progression Note (Signed)
Transition of Care [facility]) - Progression Note    Patient Details  Name: HENLEY REINECK MRN: [id number] Date of Birth: [birth year]  Transition of Care [facility]) CM/SW Contact  Su Hilt, RN Phone Number: 02/26/2019, 10:57 AM  Clinical Narrative:     [name] the patient's daughter called and stated that she is not real happy with the reviews from the facilities in [facility], She is agreeable to do a bedsearch for [place] and other counties and I will call her back with the offers once obtained  Expected Discharge Plan: [place] Barriers to Discharge: SNF Pending bed offer  Expected Discharge Plan and Services Expected Discharge Plan: [place] In-house Referral: Clinical Social Work   Post Acute Care Choice: [place] Living arrangements for the past 2 months: Single Family Home Expected Discharge Date: 02/25/19                                     Social Determinants of Health (SDOH) Interventions    Readmission Risk Interventions No flowsheet data found.

## 2019-02-26 NOTE — NC FL2 (Signed)
[place] LEVEL OF CARE SCREENING TOOL     IDENTIFICATION  Patient Name: Miranda Ryan Birthdate: Aug 23, 1945 Sex: female Admission Date (Current Location): 02/21/2019  [place] and [place] Number:  Engineering geologist and Address:  [facility], [address] [place], [place] [address]      Provider Number: [id number]  Attending Physician Name and Address:  Thornell Mule, MD  Relative Name and Phone Number:  [name] [phone]    Current Level of Care: Hospital Recommended Level of Care: [place] Prior Approval Number:    Date Approved/Denied:   PASRR Number: pending  Discharge Plan:      Current Diagnoses: Patient Active Problem List   Diagnosis Date Noted  . Dysphagia 02/23/2019  . Low serum thyroid stimulating hormone (TSH) 02/23/2019  . Severe mitral valve stenosis 02/23/2019  . Acute CVA (cerebrovascular accident) ([place]) 02/22/2019  . Generalized weakness   . Suspected UTI   . Ischemic stroke diagnosed during current admission ([place]) 02/21/2019  . Acute exacerbation of chronic obstructive pulmonary disease (COPD) ([place]) 04/20/2016    Orientation RESPIRATION BLADDER Height & Weight     Self  Normal Incontinent Weight: 65.8 kg Height:  5\' 1"  (154.9 cm)  BEHAVIORAL SYMPTOMS/MOOD NEUROLOGICAL BOWEL NUTRITION STATUS      Incontinent    AMBULATORY STATUS COMMUNICATION OF NEEDS Skin   Extensive Assist Verbally                         Personal Care Assistance Level of Assistance  Bathing, Dressing Bathing Assistance: Limited assistance   Dressing Assistance: Limited assistance     Functional Limitations Info             SPECIAL CARE FACTORS FREQUENCY  PT (By licensed PT), OT (By licensed OT)     PT Frequency: 5 days a week OT Frequency: 3 days per week            Contractures Contractures Info: Not present    Additional Factors Info  Code Status, Allergies Code Status Info:  DNR Allergies Info: demerol           Current Medications (02/26/2019):  This is the current hospital active medication list Current Facility-Administered Medications  Medication Dose Route Frequency Provider Last Rate Last Admin  . 0.9 %  sodium chloride infusion   Intravenous Continuous Thornell Mule, MD 75 mL/hr at 02/26/19 0806 New Bag at 02/26/19 0806  . acetaminophen (TYLENOL) tablet 650 mg  650 mg Oral Q4H PRN Thornell Mule, MD       Or  . acetaminophen (TYLENOL) 160 MG/5ML solution 650 mg  650 mg Per Tube Q4H PRN Thornell Mule, MD       Or  . acetaminophen (TYLENOL) suppository 650 mg  650 mg Rectal Q4H PRN Thornell Mule, MD      . aspirin EC tablet 81 mg  81 mg Oral Daily Thornell Mule, MD   81 mg at 02/26/19 0801  . atorvastatin (LIPITOR) tablet 40 mg  40 mg Oral q1800 Thornell Mule, MD   40 mg at 02/25/19 1721  . clopidogrel (PLAVIX) tablet 75 mg  75 mg Oral Daily Thornell Mule, MD   75 mg at 02/26/19 0802  . feeding supplement (ENSURE ENLIVE) (ENSURE ENLIVE) liquid 237 mL  237 mL Oral BID BM Thornell Mule, MD   237 mL at 02/25/19 0829  . heparin injection 5,000 Units  5,000 Units Subcutaneous Q8H [place], [place],  MD   5,000 Units at 02/26/19 0552  . insulin aspart (novoLOG) injection 0-15 Units  0-15 Units Subcutaneous TID AC & HS Sharion Settler, NP   3 Units at 02/26/19 1149  . MEDLINE mouth rinse  15 mL Mouth Rinse BID Thornell Mule, MD   15 mL at 02/26/19 0802  . senna-docusate (Senokot-S) tablet 1 tablet  1 tablet Oral QHS PRN Thornell Mule, MD      . sodium chloride flush (NS) 0.9 % injection 3 mL  3 mL Intravenous Once Carrie Mew, MD         Discharge Medications: Please see discharge summary for a list of discharge medications.  Relevant Imaging Results:  Relevant Lab Results:   Additional Information SS# [id number]  Su Hilt, RN

## 2019-02-26 NOTE — Progress Notes (Addendum)
Physical Therapy Treatment Patient Details Name: Miranda Ryan MRN: [id number] DOB: [birth year] Today's Date: 02/26/2019    History of Present Illness Pt is a 74 y.o. female presenting to hospital 02/21/19 with worsening generalized weakness.  Of note, pt with ED visit 12/8 for AMS (since then per chart pt has not been doing well; decreased eating; trouble swallowing; facial droop noted).  PMH includes bipolar disorder, dementia, DM, COPD, anxiety, urinary incontinence.    PT Comments    Pt in bed, leaning and curled up on right side.  She agrees to session by nodding her head.  To edge of bed with max a/dependant today.  Once sitting she is able to sit with min guard but continues to lean to right.  Verbal and tactile cues unsuccessful today to correct.  She resists physical correction.  Gilford Rile is placed in front of her but she makes no attempt to reach for walker handles despite verbal and tactile cues.  Gilford Rile is removed and stand pivot transfer is attempted to recliner at bedside but she is unable to stand despite mod/max a x 1 assist.  She is assisted back to bed with max/depenant transfer.  Attempts at rolling right for care are resisted by pt in supine.  Clearly states "Hold it, hold it, hold it." Pt positioned for comfort.  Discussed with RN, PT and OT regarding session.  Pt requiring increased overall assist, increased right lean and more disengaged during session today.  Will request PT to see pt next session.  Follow Up Recommendations  SNF     Equipment Recommendations  Rolling walker with 5" wheels;3in1 (PT);Wheelchair (measurements PT);Wheelchair cushion (measurements PT)    Recommendations for Other Services       Precautions / Restrictions Precautions Precautions: Fall Precaution Comments: Seizure precautions; aspiration precautions Restrictions Weight Bearing Restrictions: No    Mobility  Bed Mobility Overal bed mobility: Needs Assistance Bed Mobility: Supine to  Sit;Sit to Supine Rolling: Mod assist   Supine to sit: Max assist;Total assist Sit to supine: Max assist;Total assist   General bed mobility comments: resists rolling to Right,   no assist with bed mobility today  Transfers Overall transfer level: Needs assistance Equipment used: 1 person hand held assist Transfers: Sit to/from Stand Sit to Stand: Mod assist;Max assist         General transfer comment: uanble to stand today with +1 mod/max assist.  leaning right  Ambulation/Gait             General Gait Details: unable   Stairs             Wheelchair Mobility    Modified Rankin (Stroke Patients Only)       Balance Overall balance assessment: Needs assistance Sitting-balance support: Feet supported Sitting balance-Leahy Scale: Poor Sitting balance - Comments: R lateral lean did not improve today during session, resists any attempt to correct to upright Postural control: Right lateral lean Standing balance support: Bilateral upper extremity supported Standing balance-Leahy Scale: Zero Standing balance comment: unable to fully stand today                            Cognition Arousal/Alertness: Lethargic Behavior During Therapy: Flat affect Overall Cognitive Status: Within Functional Limits for tasks assessed                                 General Comments: "  hold it, hold it, hold it" clearly      Exercises Other Exercises Other Exercises: sitting EOB x 5 minutes unable to correct to upright    General Comments        Pertinent Vitals/Pain Pain Assessment: No/denies pain    Home Living                      Prior Function            PT Goals (current goals can now be found in the care plan section) Progress towards PT goals: Not progressing toward goals - comment    Frequency    7X/week      PT Plan Current plan remains appropriate    Co-evaluation              AM-PAC PT "6 Clicks"  Mobility   Outcome Measure  Help needed turning from your back to your side while in a flat bed without using bedrails?: A Lot Help needed moving from lying on your back to sitting on the side of a flat bed without using bedrails?: Total Help needed moving to and from a bed to a chair (including a wheelchair)?: Total Help needed standing up from a chair using your arms (e.g., wheelchair or bedside chair)?: Total Help needed to walk in hospital room?: Total Help needed climbing 3-5 steps with a railing? : Total 6 Click Score: 7    End of Session Equipment Utilized During Treatment: Gait belt Activity Tolerance: Patient limited by fatigue;Other (comment) Patient left: in bed;with call bell/phone within reach;with bed alarm set Nurse Communication: Other (comment);Mobility status       Time: [id number] PT Time Calculation (min) (ACUTE ONLY): 15 min  Charges:  $Therapeutic Activity: 8-22 mins                    Chesley Noon, PTA 02/26/19, 10:36 AM

## 2019-02-26 NOTE — TOC Progression Note (Signed)
Transition of Care [facility]) - Progression Note    Patient Details  Name: Miranda Ryan MRN: [id number] Date of Birth: Oct 06, 1945  Transition [facility]) CM/SW [place], RN Phone Number: 02/26/2019, 4:55 PM  Clinical Narrative:     [name] with [name] and reviewed the bed offers Reviewed each facility in depth.  She requested that I send out to [facility]   Expected Discharge Plan: [name] Barriers to Discharge: SNF Pending bed offer  Expected Discharge Plan and Services Expected Discharge Plan: [name] In-house Referral: Clinical Social Work   Post Acute Care Choice: [name] arrangements for the past 2 months: Single Family Home Expected Discharge Date: 02/25/19                                     Social Determinants of Health (SDOH) Interventions    Readmission Risk Interventions No flowsheet data found.

## 2019-02-26 NOTE — Progress Notes (Signed)
Inpatient Diabetes Program Recommendations  AACE/ADA: New Consensus Statement on Inpatient Glycemic Control (2015)  Target Ranges:  Prepandial:   less than 140 mg/dL      [place] postprandial:   less than 180 mg/dL (1-2 hours)      Critically ill patients:  140 - 180 mg/dL   Lab Results  Component Value Date   GLUCAP 180 (H) 02/26/2019   HGBA1C 7.8 (H) 02/22/2019    Review of Glycemic Control Results for Miranda Ryan, Miranda Ryan (MRN [id number]) as of 02/26/2019 13:20  Ref. Range 02/25/2019 11:38 02/25/2019 17:01 02/25/2019 20:48 02/26/2019 07:43 02/26/2019 11:37  Glucose-Capillary Latest Ref Range: 70 - 99 mg/dL 288 (H) 273 (H) 218 (H) 226 (H) 180 (H)   Diabetes history: DM 2 Outpatient Diabetes medications: None listed Current orders for Inpatient glycemic control:  Novolog moderate tid with meals and HS  Inpatient Diabetes Program Recommendations:    While in the hospital, consider adding Lantus 10 units daily. Note plans to start oral agent at d/c.  May consider Tradjenta 5 mg daily (not renally cleared).   Thanks  Adah Perl, RN, BC-ADM Inpatient Diabetes Coordinator Pager [phone] (8a-5p)

## 2019-02-26 NOTE — Progress Notes (Signed)
PROGRESS NOTE  Miranda Ryan [id number] DOB: Jan 21, 1946 DOA: 02/21/2019 PCP: [place]  Brief History   74 year old woman presented with generalized weakness, decreased oral intake, difficulty swallowing, facial droop, difficulty ambulating.  MRI showed small acute infarcts, admitted for further evaluation.  A & P  Acute ischemic strokes with associated left upper and left lower extremity weakness, dysphagia, acute encephalopathy, generalized weakness, decreased oral intake.  - Much alert and oriented; no new deficit  - Not a candidate for TPA given to unknown duration of S/S.   - Concern for large vessel small vessel embolism.  Carotid Dopplers no evidence of significant stenosis.  LDL 65. --Aspirin 81 mg and Plavix 75 mg for 3 weeks then aspirin 81 mg only --Continue statin --Stop smoking  Dysphagia --Continue diet per speech therapy, 12/31 this was dysphagia 1 thin liquids  TSH low, T4 within normal limits, other studies unremarkable. --Recheck TSH in outpatient setting  UTI --Afebrile, WBC trended down yesterday.  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  Follow-up as an outpatient.  Severe mitral stenosis, hyperdynamic LV --asymptomatic, can f/u with cardiology as an outpatient; I d/w daughter.  Diabetes mellitus type 2 --Hemoglobin A1c 7.8.  Sliding scale insulin.  Do not see that she is on any outpatient medications.   - Would start Metformin in the outpatient setting.  Dementia, bipolar disorder, depression, not on any medication --Appears stable.  Disposition: Pt was supposed to be discharged to SNF today, 02/26/19 but daughter has not chosen a place yet, per CM - Pt is to get back to CM tomorrow - Anticipated D/C on 02/27/19   DVT prophylaxis: heparin Code Status: Full Family Communication: Discussed with daughter in detail Disposition Plan: SNF     Thornell Mule, MD  [facility] Direct contact: see [email or web address] (further directions at bottom of note if needed) 7PM-7AM contact night coverage as at bottom of note 02/26/2019, 8:18 PM  LOS: 4 days   Significant Hospital Events   . 12/30 admitted for acute stroke   Consults:  . Neurology   Procedures:  .   Significant Diagnostic Tests:  . Chest x-ray no acute disease . MRI brain small acute infarcts deep left cerebral white matter.  Small vascular watershed type infarcts. . 2D echocardiogram LVEF 70-75% with hyperdynamic function.  Borderline left ventricular hypertrophy.  Grade 2 diastolic dysfunction.  No wall motion abnormalities.  Mitral valve calcifications with severe mitral stenosis. . Carotid ultrasound without significant stenosis bilaterally   Micro Data:  .    Antimicrobials:  . Ceftriaxone 12/30 > 12/21 . Keflex 1/1  Interval History/Subjective  Was lying on bed. Alert and oriented X3.   Objective   Vitals:  Vitals:   02/26/19 1443 02/26/19 1528  BP:  115/79  Pulse:  (!) 111  Resp:    Temp: 98.8 F (37.1 C) 98.6 F (37 C)  SpO2:  98%    Exam:  Constitutional.  Appears calm, comfortable. Respiratory.  Clear to auscultation bilaterally.  No wheezes, rales or rhonchi.  Normal respiratory effort. Cardiovascular.  Regular rate and rhythm.  No murmur, rub or gallop.  No lower extremity edema. Telemetry SR. Psychiatric.  Grossly normal mood and affect.  Speech fluent and appropriate.  Oriented to self, location, thought it was New Year's Eve. Musculoskeletal.  Grossly normal tone and strength all extremities. Neurologic.  No pronator drift.  No dysdiadochokinesis  of the upper extremities.  Face appears symmetric. LLExt weakness 2/4.   I have personally reviewed the following:   Today's Data  . No new data   Scheduled Meds: . aspirin EC  81 mg Oral Daily  . atorvastatin  40 mg Oral q1800  . clopidogrel  75 mg Oral Daily  . feeding supplement  (ENSURE ENLIVE)  237 mL Oral BID BM  . heparin  5,000 Units Subcutaneous Q8H  . insulin aspart  0-15 Units Subcutaneous TID AC & HS  . mouth rinse  15 mL Mouth Rinse BID  . sodium chloride flush  3 mL Intravenous Once   Continuous Infusions: . sodium chloride Stopped (02/26/19 1853)    Principal Problem:   Acute CVA (cerebrovascular accident) ([place]) Active Problems:   Ischemic stroke diagnosed during current admission ([place])   Generalized weakness   Suspected UTI   Dysphagia   Low serum thyroid stimulating hormone (TSH)   Severe mitral valve stenosis   LOS: 4 days   How to contact the [facility] Attending or Consulting provider [place] or covering provider during after hours [place], for this patient?  1. Check the care team in [facility] and look for a) attending/consulting TRH provider listed and b) the [facility] team listed 2. Log into [email or web address] and use [place]'s universal password to access. If you do not have the password, please contact the hospital operator. 3. Locate the [facility] provider you are looking for under [facility] and page to a number that you can be directly reached. 4. If you still have difficulty reaching the provider, please page the [facility] (Director on Call) for the Hospitalists listed on amion for assistance.

## 2019-02-27 LAB — GLUCOSE, CAPILLARY
Glucose-Capillary: 228 mg/dL — ABNORMAL HIGH (ref 70–99)
Glucose-Capillary: 173 mg/dL — ABNORMAL HIGH (ref 70–99)
Glucose-Capillary: 345 mg/dL — ABNORMAL HIGH (ref 70–99)

## 2019-02-27 NOTE — TOC Progression Note (Signed)
Transition of Care [facility]) - Progression Note    Patient Details  Name: Miranda Ryan MRN: [id number] Date of Birth: [birth year]  Transition of Care [facility]) CM/SW [place], RN Phone Number: 02/27/2019, 10:13 AM  Clinical Narrative:    Spoke with the daughter [name] and reviewed the bed offers in detail, [facility], I notified Tam[facility] at [facility], and anticipate DC to be today. Will transport EMS< [name] is aware of the DC   Expected Discharge Plan: Skilled Nursing Facility Barriers to Discharge: SNF Pending bed offer  Expected Discharge Plan and Services Expected Discharge Plan: [place] In-house Referral: Clinical Social Work   Post Acute Care Choice: [place] Living arrangements for the past 2 months: Single Family Home Expected Discharge Date: 02/25/19                                     Social Determinants of Health (SDOH) Interventions    Readmission Risk Interventions No flowsheet data found.

## 2019-02-27 NOTE — Progress Notes (Signed)
Daughter took home patients dentures, clothes and shoes

## 2019-02-27 NOTE — TOC Transition Note (Signed)
Transition of Care [facility]) - CM/SW Discharge Note   Patient Details  Name: Miranda Ryan MRN: [id number] Date of Birth: 1945/05/31  Transition of Care [facility]) CM/SW Contact:  Su Hilt, RN Phone Number: 02/27/2019, 10:28 AM   Clinical Narrative:    [facility] to [facility] to [facility] today via EMS, Room 803, Bedside nurse to call report to [facility], and call EMS when ready, [name] the daughter is aware   Final next level of care: Skilled Nursing Facility Barriers to Discharge: SNF Pending bed offer   Patient Goals and CMS Choice Patient states their goals for this hospitalization and ongoing recovery are:: Daughter, [name] [id number] wants her mother to get stronger. Agreed to SNF search CMS Medicare.gov Compare Post Acute Care list provided to:: Patient Represenative (must comment) Choice offered to / list presented to : Adult Children, Patient  Discharge Placement              Patient chooses bed at: [facility] [place] Patient to be transferred to facility by: EMS Name of family member notified: [name] Patient and family notified of of transfer: 02/27/19  Discharge Plan and Services In-house Referral: Clinical Social Work   Post Acute Care Choice: [place]                               Social Determinants of Health (SDOH) Interventions     Readmission Risk Interventions No flowsheet data found.

## 2019-02-27 NOTE — Progress Notes (Signed)
Report called to Maudie Mercury, LPN at [facility]. EMS called for pickup

## 2019-04-03 ENCOUNTER — Inpatient Hospital Stay
Admission: EM | Admit: 2019-04-03 | Discharge: 2019-04-12 | DRG: 853 | Disposition: A | Discharge status: Skilled Nursing Facility | Payer: Medicare Other | Attending: Internal Medicine | Admitting: Internal Medicine

## 2019-04-03 ENCOUNTER — Emergency Department: Payer: Medicare Other

## 2019-04-03 ENCOUNTER — Encounter: Payer: Self-pay | Admitting: Emergency Medicine

## 2019-04-03 DIAGNOSIS — N179 Acute kidney failure, unspecified: Secondary | ICD-10-CM | POA: Diagnosis present

## 2019-04-03 DIAGNOSIS — Z515 Encounter for palliative care: Secondary | ICD-10-CM | POA: Diagnosis present

## 2019-04-03 DIAGNOSIS — Z7401 Bed confinement status: Secondary | ICD-10-CM | POA: Diagnosis not present

## 2019-04-03 DIAGNOSIS — N189 Chronic kidney disease, unspecified: Secondary | ICD-10-CM | POA: Diagnosis not present

## 2019-04-03 DIAGNOSIS — L89319 Pressure ulcer of right buttock, unspecified stage: Secondary | ICD-10-CM | POA: Diagnosis not present

## 2019-04-03 DIAGNOSIS — E1165 Type 2 diabetes mellitus with hyperglycemia: Secondary | ICD-10-CM | POA: Diagnosis not present

## 2019-04-03 DIAGNOSIS — A419 Sepsis, unspecified organism: Secondary | ICD-10-CM | POA: Diagnosis present

## 2019-04-03 DIAGNOSIS — Z7189 Other specified counseling: Secondary | ICD-10-CM | POA: Diagnosis not present

## 2019-04-03 DIAGNOSIS — L89629 Pressure ulcer of left heel, unspecified stage: Secondary | ICD-10-CM | POA: Diagnosis not present

## 2019-04-03 DIAGNOSIS — E1122 Type 2 diabetes mellitus with diabetic chronic kidney disease: Secondary | ICD-10-CM | POA: Diagnosis present

## 2019-04-03 DIAGNOSIS — F419 Anxiety disorder, unspecified: Secondary | ICD-10-CM | POA: Diagnosis present

## 2019-04-03 DIAGNOSIS — L89150 Pressure ulcer of sacral region, unstageable: Secondary | ICD-10-CM | POA: Diagnosis present

## 2019-04-03 DIAGNOSIS — R61 Generalized hyperhidrosis: Secondary | ICD-10-CM | POA: Diagnosis present

## 2019-04-03 DIAGNOSIS — L8990 Pressure ulcer of unspecified site, unspecified stage: Secondary | ICD-10-CM | POA: Insufficient documentation

## 2019-04-03 DIAGNOSIS — I050 Rheumatic mitral stenosis: Secondary | ICD-10-CM | POA: Diagnosis present

## 2019-04-03 DIAGNOSIS — Z8673 Personal history of transient ischemic attack (TIA), and cerebral infarction without residual deficits: Secondary | ICD-10-CM

## 2019-04-03 DIAGNOSIS — R197 Diarrhea, unspecified: Secondary | ICD-10-CM | POA: Diagnosis present

## 2019-04-03 DIAGNOSIS — F17210 Nicotine dependence, cigarettes, uncomplicated: Secondary | ICD-10-CM | POA: Diagnosis present

## 2019-04-03 DIAGNOSIS — R7881 Bacteremia: Secondary | ICD-10-CM | POA: Diagnosis not present

## 2019-04-03 DIAGNOSIS — L89152 Pressure ulcer of sacral region, stage 2: Secondary | ICD-10-CM

## 2019-04-03 DIAGNOSIS — E872 Acidosis: Secondary | ICD-10-CM

## 2019-04-03 DIAGNOSIS — R6521 Severe sepsis with septic shock: Secondary | ICD-10-CM | POA: Diagnosis present

## 2019-04-03 DIAGNOSIS — Z66 Do not resuscitate: Secondary | ICD-10-CM | POA: Diagnosis present

## 2019-04-03 DIAGNOSIS — J441 Chronic obstructive pulmonary disease with (acute) exacerbation: Secondary | ICD-10-CM | POA: Diagnosis present

## 2019-04-03 DIAGNOSIS — E876 Hypokalemia: Secondary | ICD-10-CM | POA: Diagnosis not present

## 2019-04-03 DIAGNOSIS — Z7982 Long term (current) use of aspirin: Secondary | ICD-10-CM

## 2019-04-03 DIAGNOSIS — Z681 Body mass index (BMI) 19 or less, adult: Secondary | ICD-10-CM | POA: Diagnosis not present

## 2019-04-03 DIAGNOSIS — A4181 Sepsis due to Enterococcus: Principal | ICD-10-CM | POA: Diagnosis present

## 2019-04-03 DIAGNOSIS — R29810 Facial weakness: Secondary | ICD-10-CM | POA: Diagnosis present

## 2019-04-03 DIAGNOSIS — L89159 Pressure ulcer of sacral region, unspecified stage: Secondary | ICD-10-CM | POA: Diagnosis not present

## 2019-04-03 DIAGNOSIS — Z20822 Contact with and (suspected) exposure to covid-19: Secondary | ICD-10-CM | POA: Diagnosis present

## 2019-04-03 DIAGNOSIS — Z885 Allergy status to narcotic agent status: Secondary | ICD-10-CM | POA: Diagnosis not present

## 2019-04-03 DIAGNOSIS — L89619 Pressure ulcer of right heel, unspecified stage: Secondary | ICD-10-CM | POA: Diagnosis not present

## 2019-04-03 DIAGNOSIS — F0390 Unspecified dementia without behavioral disturbance: Secondary | ICD-10-CM | POA: Diagnosis present

## 2019-04-03 DIAGNOSIS — F319 Bipolar disorder, unspecified: Secondary | ICD-10-CM | POA: Diagnosis present

## 2019-04-03 DIAGNOSIS — Z79899 Other long term (current) drug therapy: Secondary | ICD-10-CM

## 2019-04-03 DIAGNOSIS — E440 Moderate protein-calorie malnutrition: Secondary | ICD-10-CM | POA: Diagnosis present

## 2019-04-03 DIAGNOSIS — N1832 Chronic kidney disease, stage 3b: Secondary | ICD-10-CM | POA: Diagnosis present

## 2019-04-03 DIAGNOSIS — R32 Unspecified urinary incontinence: Secondary | ICD-10-CM | POA: Diagnosis present

## 2019-04-03 DIAGNOSIS — R1310 Dysphagia, unspecified: Secondary | ICD-10-CM | POA: Diagnosis present

## 2019-04-03 DIAGNOSIS — E860 Dehydration: Secondary | ICD-10-CM | POA: Diagnosis present

## 2019-04-03 DIAGNOSIS — B952 Enterococcus as the cause of diseases classified elsewhere: Secondary | ICD-10-CM | POA: Diagnosis not present

## 2019-04-03 DIAGNOSIS — Z888 Allergy status to other drugs, medicaments and biological substances status: Secondary | ICD-10-CM

## 2019-04-03 DIAGNOSIS — D631 Anemia in chronic kidney disease: Secondary | ICD-10-CM | POA: Diagnosis present

## 2019-04-03 DIAGNOSIS — D638 Anemia in other chronic diseases classified elsewhere: Secondary | ICD-10-CM | POA: Diagnosis not present

## 2019-04-03 LAB — COMPREHENSIVE METABOLIC PANEL
ALT: 18 U/L (ref 0–44)
AST: 38 U/L (ref 15–41)
Albumin: 1.9 g/dL — ABNORMAL LOW (ref 3.5–5.0)
Alkaline Phosphatase: 113 U/L (ref 38–126)
Anion gap: 18 — ABNORMAL HIGH (ref 5–15)
BUN: 58 mg/dL — ABNORMAL HIGH (ref 8–23)
CO2: 21 mmol/L — ABNORMAL LOW (ref 22–32)
Calcium: 8.8 mg/dL — ABNORMAL LOW (ref 8.9–10.3)
Chloride: 96 mmol/L — ABNORMAL LOW (ref 98–111)
Creatinine, Ser: 1.67 mg/dL — ABNORMAL HIGH (ref 0.44–1.00)
GFR calc Af Amer: 35 mL/min — ABNORMAL LOW (ref >60)
GFR calc non Af Amer: 30 mL/min — ABNORMAL LOW (ref >60)
Glucose, Bld: 243 mg/dL — ABNORMAL HIGH (ref 70–99)
Potassium: 4.3 mmol/L (ref 3.5–5.1)
Sodium: 135 mmol/L (ref 135–145)
Total Bilirubin: 0.8 mg/dL (ref 0.3–1.2)
Total Protein: 7.3 g/dL (ref 6.5–8.1)

## 2019-04-03 LAB — CBC WITH DIFFERENTIAL/PLATELET
Abs Immature Granulocytes: 0.3 10*3/uL — ABNORMAL HIGH (ref 0.00–0.07)
Basophils Absolute: 0.1 10*3/uL (ref 0.0–0.1)
Basophils Relative: 0 %
Eosinophils Absolute: 0 10*3/uL (ref 0.0–0.5)
Eosinophils Relative: 0 %
HCT: 33.1 % — ABNORMAL LOW (ref 36.0–46.0)
Hemoglobin: 10 g/dL — ABNORMAL LOW (ref 12.0–15.0)
Immature Granulocytes: 1 %
Lymphocytes Relative: 4 %
Lymphs Abs: 0.9 10*3/uL (ref 0.7–4.0)
MCH: 25.3 pg — ABNORMAL LOW (ref 26.0–34.0)
MCHC: 30.2 g/dL (ref 30.0–36.0)
MCV: 83.8 fL (ref 80.0–100.0)
Monocytes Absolute: 0.7 10*3/uL (ref 0.1–1.0)
Monocytes Relative: 3 %
Neutro Abs: 20.9 10*3/uL — ABNORMAL HIGH (ref 1.7–7.7)
Neutrophils Relative %: 92 %
Platelets: 407 10*3/uL — ABNORMAL HIGH (ref 150–400)
RBC: 3.95 MIL/uL (ref 3.87–5.11)
RDW: 17.1 % — ABNORMAL HIGH (ref 11.5–15.5)
WBC: 22.9 10*3/uL — ABNORMAL HIGH (ref 4.0–10.5)
nRBC: 0 % (ref 0.0–0.2)

## 2019-04-03 LAB — LACTIC ACID, PLASMA
Lactic Acid, Venous: 5.8 mmol/L (ref 0.5–1.9)
Lactic Acid, Venous: 5.9 mmol/L (ref 0.5–1.9)

## 2019-04-03 LAB — CBC
HCT: 31.1 % — ABNORMAL LOW (ref 36.0–46.0)
Hemoglobin: 9.3 g/dL — ABNORMAL LOW (ref 12.0–15.0)
MCH: 25.6 pg — ABNORMAL LOW (ref 26.0–34.0)
MCHC: 29.9 g/dL — ABNORMAL LOW (ref 30.0–36.0)
MCV: 85.7 fL (ref 80.0–100.0)
Platelets: 344 10*3/uL (ref 150–400)
RBC: 3.63 MIL/uL — ABNORMAL LOW (ref 3.87–5.11)
RDW: 16.9 % — ABNORMAL HIGH (ref 11.5–15.5)
WBC: 20.6 10*3/uL — ABNORMAL HIGH (ref 4.0–10.5)
nRBC: 0 % (ref 0.0–0.2)

## 2019-04-03 LAB — MRSA PCR SCREENING: MRSA by PCR: NEGATIVE

## 2019-04-03 LAB — GLUCOSE, CAPILLARY
Glucose-Capillary: 146 mg/dL — ABNORMAL HIGH (ref 70–99)
Glucose-Capillary: 144 mg/dL — ABNORMAL HIGH (ref 70–99)

## 2019-04-03 LAB — BLOOD GAS, VENOUS
Acid-base deficit: 4.8 mmol/L — ABNORMAL HIGH (ref 0.0–2.0)
Bicarbonate: 21.1 mmol/L (ref 20.0–28.0)
O2 Saturation: 53.2 %
Patient temperature: 37
pCO2, Ven: 41 mmHg — ABNORMAL LOW (ref 44.0–60.0)
pH, Ven: 7.32 (ref 7.250–7.430)

## 2019-04-03 LAB — PROTIME-INR
INR: 1 (ref 0.8–1.2)
Prothrombin Time: 12.8 seconds (ref 11.4–15.2)

## 2019-04-03 LAB — PROCALCITONIN: Procalcitonin: 3.32 ng/mL

## 2019-04-03 LAB — RESPIRATORY PANEL BY RT PCR (FLU A&B, COVID)
Influenza A by PCR: NEGATIVE
Influenza B by PCR: NEGATIVE
SARS Coronavirus 2 by RT PCR: NEGATIVE

## 2019-04-03 LAB — TROPONIN I (HIGH SENSITIVITY)
Troponin I (High Sensitivity): 21 ng/L — ABNORMAL HIGH (ref <18)
Troponin I (High Sensitivity): 22 ng/L — ABNORMAL HIGH (ref <18)

## 2019-04-03 LAB — APTT: aPTT: 27 seconds (ref 24–36)

## 2019-04-03 IMAGING — DX DG CHEST 1V PORT
1 series · 1 of 1 positions shown · non-contrast
Comparison: Portable exam [L9] hours compared to [DATE]

CLINICAL DATA: Wound, weakness, COPD, dementia, diabetes mellitus

EXAM:
PORTABLE CHEST 1 VIEW

[chest ap]
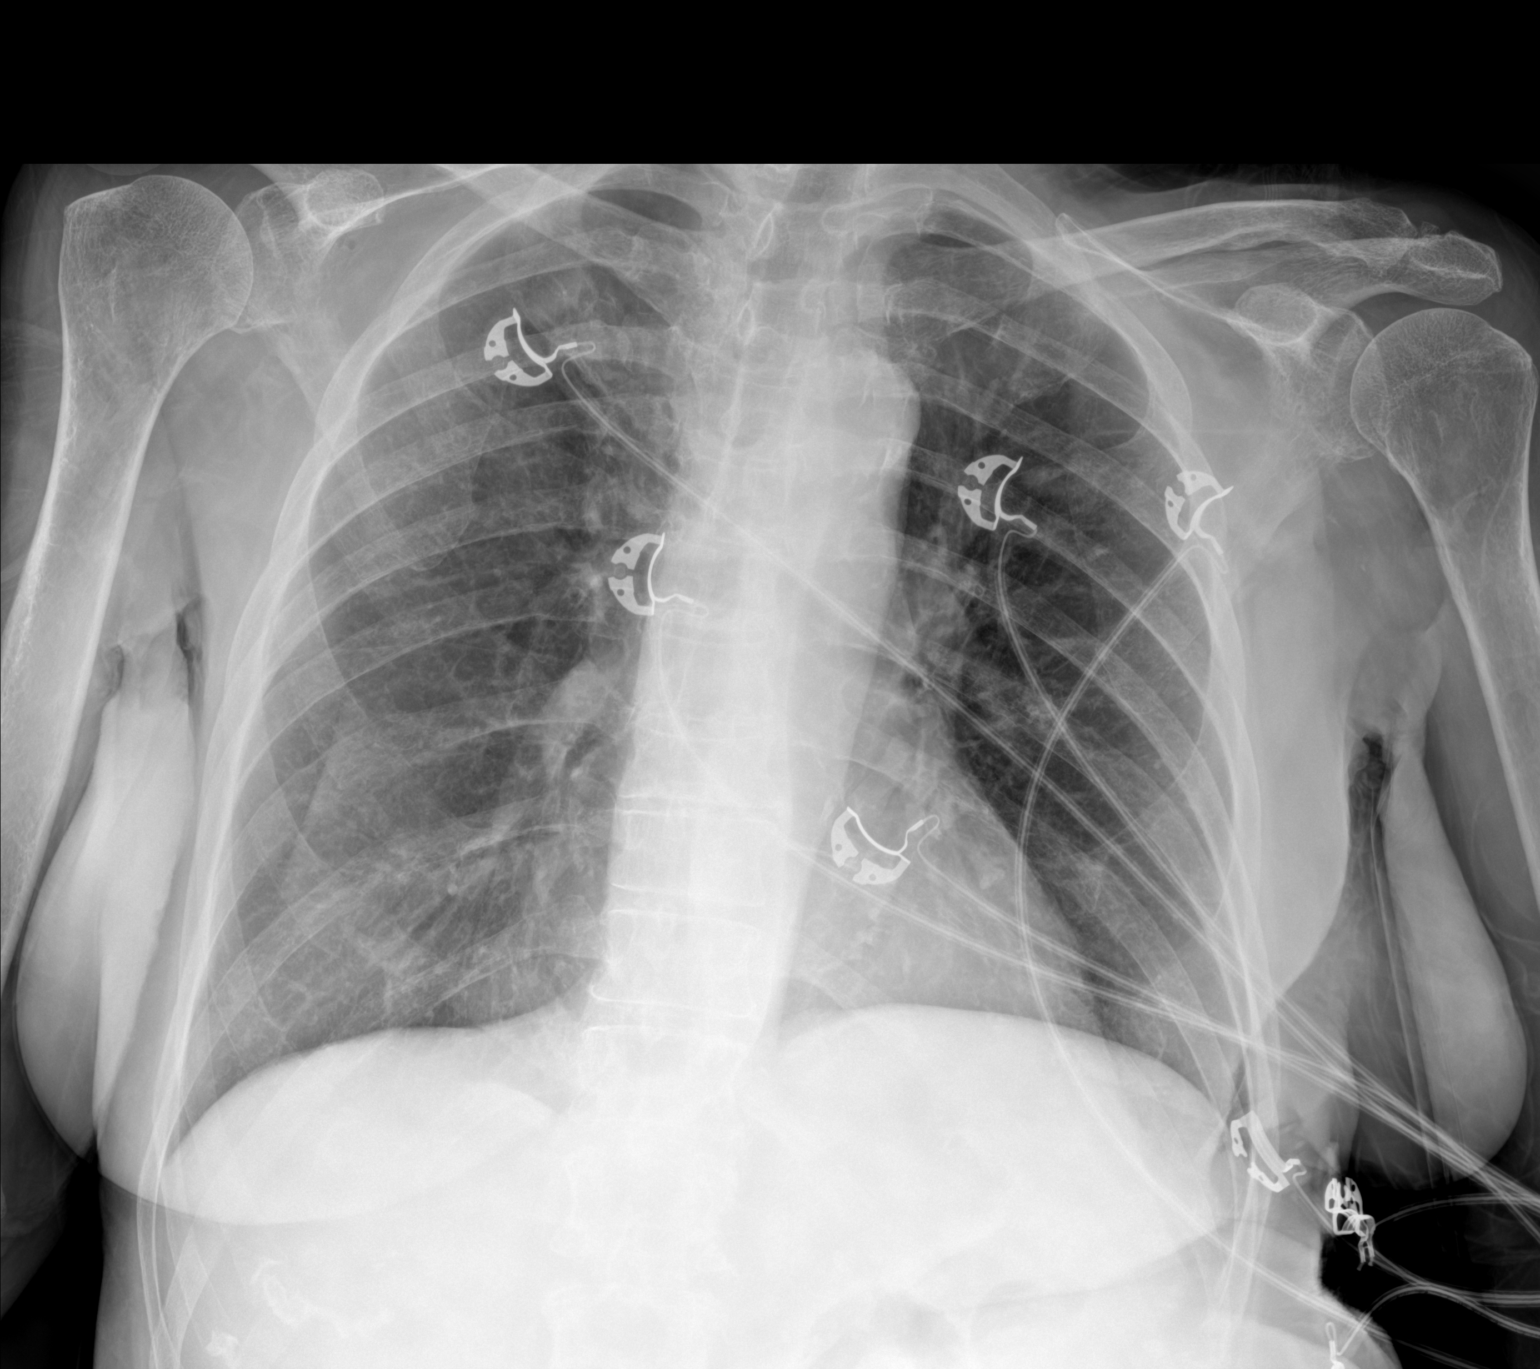

[1 of 1 positions shown; findings below may reference images not displayed]

FINDINGS: Normal heart size, mediastinal contours, and pulmonary vascularity.

Atherosclerotic calcification aorta.

Skin folds project over RIGHT chest.

Lungs clear.

No pulmonary infiltrate, pleural effusion or pneumothorax.

Bones demineralized.
IMPRESSION: No acute abnormalities.

## 2019-04-03 MED ORDER — LACTATED RINGERS IV SOLN
INTRAVENOUS | Status: DC
  Administered 2019-04-03 – 2019-04-04 (×2): 125 mL/h via INTRAVENOUS

## 2019-04-03 MED ORDER — INSULIN ASPART 100 UNIT/ML ~~LOC~~ SOLN
0.0000 [IU] | Freq: Three times a day (TID) | SUBCUTANEOUS | Status: DC
  Administered 2019-04-03: 1 [IU] via SUBCUTANEOUS
  Filled 2019-04-03: qty 1

## 2019-04-03 MED ORDER — HEPARIN SODIUM (PORCINE) 5000 UNIT/ML IJ SOLN
5000.0000 [IU] | Freq: Three times a day (TID) | INTRAMUSCULAR | Status: DC
  Administered 2019-04-03 – 2019-04-06 (×8): 5000 [IU] via SUBCUTANEOUS
  Filled 2019-04-03 (×8): qty 1

## 2019-04-03 MED ORDER — ADULT MULTIVITAMIN W/MINERALS CH
1.0000 | ORAL_TABLET | Freq: Every day | ORAL | Status: DC
  Administered 2019-04-03 – 2019-04-06 (×4): 1 via ORAL
  Filled 2019-04-03 (×4): qty 1

## 2019-04-03 MED ORDER — LACTATED RINGERS IV BOLUS (SEPSIS)
1000.0000 mL | Freq: Once | INTRAVENOUS | Status: AC
  Administered 2019-04-03: 1000 mL via INTRAVENOUS

## 2019-04-03 MED ORDER — INSULIN ASPART 100 UNIT/ML ~~LOC~~ SOLN
0.0000 [IU] | SUBCUTANEOUS | Status: DC
  Administered 2019-04-03: 1 [IU] via SUBCUTANEOUS
  Administered 2019-04-04 (×4): 2 [IU] via SUBCUTANEOUS
  Administered 2019-04-04: 1 [IU] via SUBCUTANEOUS
  Administered 2019-04-04: 3 [IU] via SUBCUTANEOUS
  Administered 2019-04-04: 2 [IU] via SUBCUTANEOUS
  Administered 2019-04-05: 3 [IU] via SUBCUTANEOUS
  Administered 2019-04-05: 5 [IU] via SUBCUTANEOUS
  Administered 2019-04-05: 2 [IU] via SUBCUTANEOUS
  Administered 2019-04-05: 1 [IU] via SUBCUTANEOUS
  Administered 2019-04-05: 2 [IU] via SUBCUTANEOUS
  Administered 2019-04-06: 1 [IU] via SUBCUTANEOUS
  Administered 2019-04-06: 2 [IU] via SUBCUTANEOUS
  Filled 2019-04-03 (×15): qty 1

## 2019-04-03 MED ORDER — VANCOMYCIN HCL IN DEXTROSE 1-5 GM/200ML-% IV SOLN
1000.0000 mg | Freq: Once | INTRAVENOUS | Status: AC
  Administered 2019-04-03: 1000 mg via INTRAVENOUS
  Filled 2019-04-03: qty 200

## 2019-04-03 MED ORDER — SODIUM CHLORIDE 0.9% FLUSH
3.0000 mL | Freq: Two times a day (BID) | INTRAVENOUS | Status: DC
  Administered 2019-04-03 – 2019-04-12 (×17): 3 mL via INTRAVENOUS

## 2019-04-03 MED ORDER — SODIUM CHLORIDE 0.9 % IV SOLN
2.0000 g | INTRAVENOUS | Status: DC
  Administered 2019-04-03: 2 g via INTRAVENOUS
  Filled 2019-04-03 (×2): qty 2

## 2019-04-03 MED ORDER — SODIUM CHLORIDE 0.9 % IV SOLN
2.0000 g | Freq: Once | INTRAVENOUS | Status: AC
  Administered 2019-04-03: 2 g via INTRAVENOUS
  Filled 2019-04-03: qty 20

## 2019-04-03 MED ORDER — HYDROCORTISONE NA SUCCINATE PF 100 MG IJ SOLR
50.0000 mg | Freq: Four times a day (QID) | INTRAMUSCULAR | Status: DC
  Administered 2019-04-03 – 2019-04-06 (×12): 50 mg via INTRAVENOUS
  Filled 2019-04-03 (×12): qty 2

## 2019-04-03 MED ORDER — ONDANSETRON HCL 4 MG PO TABS: 4.0000 mg | ORAL_TABLET | Freq: Four times a day (QID) | ORAL | Status: DC | PRN

## 2019-04-03 MED ORDER — BISACODYL 5 MG PO TBEC: 5.0000 mg | DELAYED_RELEASE_TABLET | Freq: Every day | ORAL | Status: DC | PRN

## 2019-04-03 MED ORDER — VANCOMYCIN HCL IN DEXTROSE 1-5 GM/200ML-% IV SOLN: 1000.0000 mg | INTRAVENOUS | Status: DC

## 2019-04-03 MED ORDER — ASPIRIN 81 MG PO CHEW
81.0000 mg | CHEWABLE_TABLET | Freq: Every day | ORAL | Status: DC
  Administered 2019-04-03 – 2019-04-06 (×4): 81 mg via ORAL
  Filled 2019-04-03 (×4): qty 1

## 2019-04-03 MED ORDER — ONDANSETRON HCL 4 MG/2ML IJ SOLN: 4.0000 mg | Freq: Four times a day (QID) | INTRAMUSCULAR | Status: DC | PRN

## 2019-04-03 MED ORDER — ACETAMINOPHEN 325 MG PO TABS: 650.0000 mg | ORAL_TABLET | Freq: Four times a day (QID) | ORAL | Status: DC | PRN

## 2019-04-03 MED ORDER — ACETAMINOPHEN 650 MG RE SUPP: 650.0000 mg | Freq: Four times a day (QID) | RECTAL | Status: DC | PRN

## 2019-04-03 NOTE — Progress Notes (Signed)
Notified bedside nurse of need to draw repeat lactic acid. 

## 2019-04-03 NOTE — ED Notes (Signed)
Report called to angela rn icu nurse.

## 2019-04-03 NOTE — Consult Note (Signed)
PHARMACY -  BRIEF ANTIBIOTIC NOTE   Pharmacy has received consult(s) for Vancomycin from an ED provider.  The patient's profile has been reviewed for ht/wt/allergies/indication/available labs.    One time order(s) placed for Vancomycin 1000mg  IV x 1  Further antibiotics/pharmacy consults should be ordered by admitting physician if indicated.                       Thank you,  Lu Duffel, PharmD, BCPS Clinical Pharmacist 04/03/2019 1:28 PM

## 2019-04-03 NOTE — ED Notes (Signed)
Resumed care from [facility].  Pt alert.  Iv in place.  No acute distress.

## 2019-04-03 NOTE — ED Provider Notes (Signed)
[facility] Emergency Department Provider Note   ____________________________________________    I have reviewed the triage vital signs and the nursing notes.   HISTORY  Chief Complaint Wound Check  History limited by dementia   HPI Miranda Ryan is a 74 y.o. female with a history as noted below brought in by daughter for diaphoresis, weakness.  Daughter reports patient had a significant stroke in December, was cared for [facility] for period of time recently transitioned home and daughter has been caring for her.  Patient does have a bedsore which daughter is been caring for which has been worsening recently.  Reported fever this morning.  Patient is unable to provide any history due to dementia  Past Medical History:  Diagnosis Date  . Acute CVA (cerebrovascular accident) ([place]) 02/22/2019  . Acute exacerbation of chronic obstructive pulmonary disease (COPD) ([place]) 04/20/2016  . Anxiety   . Bipolar disorder ([place])   . Dementia ([place])   . Depression   . Diabetes mellitus, type II ([place])   . Dysphagia 02/23/2019  . Incontinence of urine   . Low serum thyroid stimulating hormone (TSH) 02/23/2019  . Severe mitral valve stenosis 02/23/2019    Patient Active Problem List   Diagnosis Date Noted  . Dysphagia 02/23/2019  . Low serum thyroid stimulating hormone (TSH) 02/23/2019  . Severe mitral valve stenosis 02/23/2019  . Acute CVA (cerebrovascular accident) ([place]) 02/22/2019  . Generalized weakness   . Suspected UTI   . Ischemic stroke diagnosed during current admission ([place]) 02/21/2019  . Acute exacerbation of chronic obstructive pulmonary disease (COPD) ([place]) 04/20/2016    Past Surgical History:  Procedure Laterality Date  . CERVIX SURGERY      Prior to Admission medications   Medication Sig Start Date End Date Taking? Authorizing Provider  aspirin EC 81 MG EC tablet Take 1 tablet (81 mg total) by mouth daily. 02/25/19   Samuella Cota, MD    atorvastatin (LIPITOR) 40 MG tablet Take 1 tablet (40 mg total) by mouth daily at 6 PM. 02/24/19   Samuella Cota, MD  busPIRone (BUSPAR) 10 MG tablet Take 10 mg by mouth 2 (two) times daily.    [provider]  feeding supplement, ENSURE ENLIVE, (ENSURE ENLIVE) LIQD Take 237 mLs by mouth 2 (two) times daily between meals. 02/24/19   Samuella Cota, MD  metFORMIN (GLUCOPHAGE) 500 MG tablet Take 1 tablet (500 mg total) by mouth 2 (two) times daily with a meal. 02/24/19 02/24/20  Samuella Cota, MD     Allergies Demerol [meperidine]  Family History  Problem Relation Age of Onset  . Liver cancer Mother   . Heart attack Father   . Heart disease Sister   . Heart disease Brother   . Heart disease Brother   . Heart disease Brother     Social History Social History   Tobacco Use  . Smoking status: Current Every Day Smoker    Packs/day: 1.00    Years: 46.00    Pack years: 46.00    Types: Cigarettes    Start date: 09/16/1964  . Smokeless tobacco: Never Used  Substance Use Topics  . Alcohol use: Not Currently    Alcohol/week: 0.0 standard drinks    Comment: occasionally  . Drug use: No    Unable to complete review of Systems due to altered mental status  Constitutional: Per daughter fever Eyes: No discharge  Respiratory: No reports of cough Gastrointestinal: no vomiting.  Genitourinary: Foul-smelling urine  Skin: As above Neurological: No reports of new weakness   ____________________________________________   PHYSICAL EXAM:  VITAL SIGNS: ED Triage Vitals  Enc Vitals Group     BP 04/03/19 1255 (!) 62/47     Pulse Rate 04/03/19 1255 (!) 117     Resp 04/03/19 1255 18     Temp 04/03/19 1255 (!) 96.6 F (35.9 C)     Temp Source 04/03/19 1255 Axillary     SpO2 --      Weight 04/03/19 1256 61.2 kg (135 lb)     Height --      Head Circumference --      [facility] Flow --      Pain Score --      Pain Loc --      Pain Edu? --      Excl. in [place]? --      Constitutional: Alert, disoriented Eyes: Conjunctivae are normal.  Head: Atraumatic. Nose: No congestion/rhinnorhea. Mouth/Throat: Mucous membranes are dry Neck:  Painless ROM Cardiovascular: Tachycardia, regular rhythm. Grossly normal heart sounds.  Good peripheral circulation. Respiratory: Normal respiratory effort.  No retractions. Gastrointestinal: Soft and nontender. No distention.    Musculoskeletal:  Warm and well perfused Neurologic:  Normal speech and language. No gross focal neurologic deficits are appreciated.  Skin:  Skin is warm, dry, patient with decubitus ulcer, likely stage II/III with surrounding erythema, foul smell Psychiatric: Mood and affect are normal. Speech and behavior are normal.  ____________________________________________   LABS (all labs ordered are listed, but only abnormal results are displayed)  Labs Reviewed  LACTIC ACID, PLASMA - Abnormal; Notable for the following components:      Result Value   Lactic Acid, Venous 5.8 (*)    All other components within normal limits  COMPREHENSIVE METABOLIC PANEL - Abnormal; Notable for the following components:   Chloride 96 (*)    CO2 21 (*)    Glucose, Bld 243 (*)    BUN 58 (*)    Creatinine, Ser 1.67 (*)    Calcium 8.8 (*)    Albumin 1.9 (*)    GFR calc non Af Amer 30 (*)    GFR calc Af Amer 35 (*)    Anion gap 18 (*)    All other components within normal limits  CBC WITH DIFFERENTIAL/PLATELET - Abnormal; Notable for the following components:   WBC 22.9 (*)    Hemoglobin 10.0 (*)    HCT 33.1 (*)    MCH 25.3 (*)    RDW 17.1 (*)    Platelets 407 (*)    Neutro Abs 20.9 (*)    Abs Immature Granulocytes 0.30 (*)    All other components within normal limits  CULTURE, BLOOD (ROUTINE X 2)  CULTURE, BLOOD (ROUTINE X 2)  URINE CULTURE  RESPIRATORY PANEL BY RT PCR (FLU A&B, COVID)  APTT  PROTIME-INR  PROCALCITONIN  LACTIC ACID, PLASMA  URINALYSIS, COMPLETE (UACMP) WITH MICROSCOPIC  BLOOD GAS,  VENOUS   ____________________________________________  EKG  ED ECG REPORT I, Lavonia Drafts, the attending physician, personally viewed and interpreted this ECG.  Date: 04/03/2019  Rhythm: Sinus tachycardia QRS Axis: normal Intervals: normal ST/T Wave abnormalities: normal Narrative Interpretation: no evidence of acute ischemia  ____________________________________________  RADIOLOGY  cxr unremarkable ____________________________________________   PROCEDURES  Procedure(s) performed: No  Procedures   Critical Care performed: yes  CRITICAL CARE Performed by: Lavonia Drafts   Total critical care time: 30 minutes  Critical care time was exclusive  of separately billable procedures and treating other patients.  Critical care was necessary to treat or prevent imminent or life-threatening deterioration.  Critical care was time spent personally by me on the following activities: development of treatment plan with patient and/or surrogate as well as nursing, discussions with consultants, evaluation of patient's response to treatment, examination of patient, obtaining history from patient or surrogate, ordering and performing treatments and interventions, ordering and review of laboratory studies, ordering and review of radiographic studies, pulse oximetry and re-evaluation of patient's condition.  ____________________________________________   INITIAL IMPRESSION / ASSESSMENT AND PLAN / ED COURSE  Pertinent labs & imaging results that were available during my care of the patient were reviewed by me and considered in my medical decision making (see chart for details).  Patient presents with reports of fever at home, diaphoresis.  Found to be tachycardic, severely hypotensive and hypothermic here, given decubitus ulcer with surrounding erythema consistent with cellulitis/infection most consistent with septic shock.  30 mils per kilogram of LR ordered, IV antibiotics,  broad-spectrum ordered.  Labs pending.  Will monitor carefully.  After 1 L of fluid patient's blood pressure is improving.  Lab work is notable for BUN of 58, creatinine of 1.67 consistent with dehydration.  Mildly elevated glucose.  White blood cell count of 22,000 and lactic acid of 5.8.  Chest x-ray is unremarkable  Will discuss with the hospitalist for admission\   Sepsis - Repeat Assessment  Performed at:    [facility]     Blood pressure 113/86, pulse (!) 117, temperature (!) 96.6 F (35.9 C), temperature source Axillary, resp. rate 19, weight 61.2 kg, SpO2 93 %.  Heart:     Tachycardic  Lungs:    CTA  Capillary Refill:   <2 sec  Peripheral Pulse:   Radial pulse palpable  Skin:     Normal Color      ____________________________________________   FINAL CLINICAL IMPRESSION(S) / ED DIAGNOSES  Final diagnoses:  Septic shock ([place])        Note:  This document was prepared using Dragon voice recognition software and may include unintentional dictation errors.   Lavonia Drafts, MD 04/03/19 [phone]

## 2019-04-03 NOTE — Consult Note (Signed)
CODE SEPSIS - PHARMACY COMMUNICATION  **Broad Spectrum Antibiotics should be administered within 1 hour of Sepsis diagnosis**  Time Code Sepsis Called/Page Received: 1305  Antibiotics Ordered: Vancomycin/Ceftriaxone  Time of 1st antibiotic administration: 1352  Additional action taken by pharmacy: None  If necessary, Name of Provider/Nurse Contacted: [place], PharmD, BCPS Clinical Pharmacist 04/03/2019 2:10 PM;l

## 2019-04-03 NOTE — H&P (Signed)
History and Physical    Miranda Ryan [id number] DOB: 09-05-1945 DOA: 04/03/2019  PCP: [facility]  Patient coming from: home   Chief Complaint: diaphoresis, decubitus ulcer  HPI: Miranda Ryan is a 74 y.o. female with medical history significant of 74 year old woman presented with generalized weakness, decreased oral intake, difficulty swallowing, facial droop, difficulty ambulation who was discharged from the hospital on 02/24/19 after being admitted for acute ischemic stroke with with associated left upper and lower extremity weakness, dysphagia, acute encephalopathy, generalized weakness, decreased oral intake. was brought in by daughter who takes care of the mother for "fever" as she was diaphoretic and the bed was wet.  Daughter reports patient was at [facility] and was discharged home on January 25.  Patient has a wound ulcer on the buttocks and she was told by nursing at [facility] if there was odor or discharge or fever to bring patient to the ED.  Patient was seen and examined by me.  She is oriented to place and person but not to time.  Daughter states she is nonambulatory after the stroke.  Patient denies shortness of breath.  Daughter states she did not think she was short of breath but felt her voice was raspy.  Daughter was concerned about the wound also on her buttocks. Please note patient is a poor historian history was given by daughter via phone as she had already left ER when I went to the room.  Please note patient not very cooperative with physical exam  ED Course: In the ED patient was found hypotensive initially at 62/47, tachycardic, temp 96.3 , wbc 22.9, covid test pending,  Creatinine 1.67, lactic acid 5.8, procalcitonin of 3.32.  Chest x-ray without acute abnormality.  Taken in ED.  In the ED she was on 1 L of IV fluids LR with BP systolic in the 0000000.  When I saw patient still LR running.  He was given ceftriaxone and vancomycin.  Review of Systems: All  systems reviewed and otherwise negative.    Past Medical History:  Diagnosis Date  . Acute CVA (cerebrovascular accident) ([place]) 02/22/2019  . Acute exacerbation of chronic obstructive pulmonary disease (COPD) ([place]) 04/20/2016  . Anxiety   . Bipolar disorder ([place])   . Dementia ([place])   . Depression   . Diabetes mellitus, type II ([place])   . Dysphagia 02/23/2019  . Incontinence of urine   . Low serum thyroid stimulating hormone (TSH) 02/23/2019  . Severe mitral valve stenosis 02/23/2019    Past Surgical History:  Procedure Laterality Date  . CERVIX SURGERY       reports that she has been smoking cigarettes. She started smoking about 54 years ago. She has a 46.00 pack-year smoking history. She has never used smokeless tobacco. She reports previous alcohol use. She reports that she does not use drugs.  Allergies  Allergen Reactions  . Demerol [Meperidine] Other (See Comments)    Pt states she "turned green"     Family History  Problem Relation Age of Onset  . Liver cancer Mother   . Heart attack Father   . Heart disease Sister   . Heart disease Brother   . Heart disease Brother   . Heart disease Brother      Prior to Admission medications   Medication Sig Start Date End Date Taking? Authorizing Provider  aspirin EC 81 MG EC tablet Take 1 tablet (81 mg total) by mouth daily. 02/25/19  Yes Samuella Cota, MD  atorvastatin (LIPITOR) 40 MG tablet Take 1 tablet (40 mg total) by mouth daily at 6 PM. 02/24/19  Yes Samuella Cota, MD  busPIRone (BUSPAR) 10 MG tablet Take 10 mg by mouth 2 (two) times daily.   Yes [provider]  feeding supplement, ENSURE ENLIVE, (ENSURE ENLIVE) LIQD Take 237 mLs by mouth 2 (two) times daily between meals. 02/24/19  Yes Samuella Cota, MD  ibuprofen (ADVIL) 200 MG tablet Take 400 mg by mouth every 6 (six) hours as needed.   Yes [provider]  linagliptin (TRADJENTA) 5 MG TABS tablet Take 5 mg by mouth daily.   Yes [provider]  metFORMIN (GLUCOPHAGE) 500 MG tablet Take 1 tablet (500 mg total) by mouth 2 (two) times daily with a meal. 02/24/19 02/24/20 Yes Samuella Cota, MD    Physical Exam: Vitals:   04/03/19 1256 04/03/19 1300 04/03/19 1345 04/03/19 1445  BP:  (!) 72/51 (!) 77/58 109/82  Pulse:      Resp:  (!) 23 (!) 22 (!) 25  Temp:      TempSrc:      SpO2:  96% 94% 93%  Weight: 61.2 kg       Constitutional: NAD, calm, comfortable Vitals:   04/03/19 1256 04/03/19 1300 04/03/19 1345 04/03/19 1445  BP:  (!) 72/51 (!) 77/58 109/82  Pulse:      Resp:  (!) 23 (!) 22 (!) 25  Temp:      TempSrc:      SpO2:  96% 94% 93%  Weight: 61.2 kg      Eyes: PERRL, normal conjuctiva ENMT: DMM Neck: normal, supple Respiratory: clear to auscultation bilaterally, no wheezing, no crackles. With poor  respiratory effort. No accessory muscle use.  Cardiovascular: Regular rate and rhythm, no murmurs / rubs / gallops.  Abdomen: no tenderness, soft, nondistended bowel sounds positive.  Musculoskeletal: No clubbing, cyanosis, or edema  Buttocks: large necrotic wound 3x7 cm? No discharge, skin peeling off with surrounding eyrthema. No odor. Also large red patch at lateral buttocks skin: Warm dry Neurologic: Psychiatric: Alert and oriented to place and person not to date , quiet mood Unable to assess cranial nerves as patient is not too cooperative with exam   Labs on Admission: I have personally reviewed following labs and imaging studies  CBC: Recent Labs  Lab 04/03/19 1302  WBC 22.9*  NEUTROABS 20.9*  HGB 10.0*  HCT 33.1*  MCV 83.8  PLT [id number]*   Basic Metabolic Panel: Recent Labs  Lab 04/03/19 1302  NA 135  K 4.3  CL 96*  CO2 21*  GLUCOSE 243*  BUN 58*  CREATININE 1.67*  CALCIUM 8.8*   GFR: Estimated Creatinine Clearance: 24.8 mL/min (A) (by C-G formula based on SCr of 1.67 mg/dL (H)). Liver Function Tests: Recent Labs  Lab 04/03/19 1302  AST 38  ALT 18  ALKPHOS 113  BILITOT  0.8  PROT 7.3  ALBUMIN 1.9*   No results for input(s): LIPASE, AMYLASE in the last 168 hours. No results for input(s): AMMONIA in the last 168 hours. Coagulation Profile: Recent Labs  Lab 04/03/19 1302  INR 1.0   Cardiac Enzymes: No results for input(s): CKTOTAL, CKMB, CKMBINDEX, TROPONINI in the last 168 hours. BNP (last 3 results) No results for input(s): PROBNP in the last 8760 hours. HbA1C: No results for input(s): HGBA1C in the last 72 hours. CBG: No results for input(s): GLUCAP in the last 168 hours. Lipid Profile: No results for input(s): CHOL,  HDL, LDLCALC, TRIG, CHOLHDL, LDLDIRECT in the last 72 hours. Thyroid Function Tests: No results for input(s): TSH, T4TOTAL, FREET4, T3FREE, THYROIDAB in the last 72 hours. Anemia Panel: No results for input(s): VITAMINB12, FOLATE, FERRITIN, TIBC, IRON, RETICCTPCT in the last 72 hours. Urine analysis:    Component Value Date/Time   COLORURINE YELLOW (A) 02/21/2019 2008   APPEARANCEUR TURBID (A) 02/21/2019 2008   LABSPEC 1.018 02/21/2019 2008   PHURINE 6.0 02/21/2019 2008   GLUCOSEU NEGATIVE 02/21/2019 2008   HGBUR SMALL (A) 02/21/2019 2008   [place] NEGATIVE 02/21/2019 2008   [place] NEGATIVE 02/21/2019 2008   PROTEINUR 100 (A) 02/21/2019 2008   NITRITE POSITIVE (A) 02/21/2019 2008   LEUKOCYTESUR MODERATE (A) 02/21/2019 2008    Radiological Exams on Admission: DG Chest Port 1 View  Result Date: 04/03/2019 CLINICAL DATA:  Wound, weakness, COPD, dementia, diabetes mellitus EXAM: PORTABLE CHEST 1 VIEW COMPARISON:  Portable exam 1315 hours compared to 02/21/2019 FINDINGS: Normal heart size, mediastinal contours, and pulmonary vascularity. Atherosclerotic calcification aorta. Skin folds project over RIGHT chest. Lungs clear. No pulmonary infiltrate, pleural effusion or pneumothorax. Bones demineralized. IMPRESSION: No acute abnormalities. Electronically Signed   By: Lavonia Dana M.D.   On: 04/03/2019 13:27    EKG:  Independently reviewed.sinus tachy, pac  Assessment/Plan Active Problems:   Septic shock ([place])   1.  Septic shock-etiology unclear however the likely source is her decubitus ulcer.  Criteria for shock is leukocytosis, tachycardia, lactic acid elevation, and hypotension. No purulence from the decubitus ulcer. Lactic acidosis. Pro calcitonin elevated. Blood pressure improved to systolic 0000000 with IV fluid We will admit to stepdown unit Follow blood cultures Chest x-ray negative for acute abnormality Follow-up Covid test which is pending, placed on airborne and contact precautions until test returns Continue vancomycin and will add cefepime to regimen Check urinalysis and urine culture  2. Acute on CKI-likely prerenal from septic shock. May have some ATN from shock. We will continue hydration with IV fluids LR at 125 mL's per hour Strict I's and O's Monitor creatinine  3.Anion Gap metabolic acidosis-with lactic acidosis Likely from #1 and dehydration Monitor lactic acid serially IV fluids And further management as #1   4.Hx/o recent acute ischemic stroke with associated UE weakness/dysphagia. Dysphagia level 1 (PUREE) w/ Thin liquids  Continue aspirin 81mg .  Was treated on last discharge with aspirin and Plavix with finishing Plavix on 1/22 per discharge summary. Aspiration precaution  5.DM type-with hyperglycemia- likely from infection riss Ck fs Monitor closely   6. Dementia-bipolar ds, depression- med rec reconciliation consult to be done by pharmacy.    DVT prophylaxis: heparin Code Status: DNR, verified by her daughter Miranda Ryan Family Communication: Updated daughter Miranda Ryan Disposition Plan: Discharge back home when medically stable Consults called: None Admission status: Inpatient as patient requires 2 midnight stay as she is in septic shock   Nolberto Hanlon MD [facility] Pager [phone]  If 7PM-7AM, please contact night-coverage [email or web address] Password  [facility]  04/03/2019, 3:17 PM

## 2019-04-03 NOTE — ED Notes (Signed)
Hospitalist to bedside.

## 2019-04-03 NOTE — ED Triage Notes (Signed)
Patient is bedridden at home.  broguht in for wound.  Patient does not know why she is here.  Family member told us about the wound.

## 2019-04-03 NOTE — Consult Note (Signed)
Pharmacy Antibiotic Note  Miranda Ryan is a 74 y.o. female admitted on 04/03/2019 with sepsis/septic shock.  Pharmacy has been consulted for Vancomycin/Cefepime dosing.  Plan: Will dose Cefepime 2g IV q 24h.  Pt received 1000mg  loading dose IV Vancomycin  Will start dosing Vancomycin 1000 mg IV Q 48 hrs. Goal AUC 400-550. Expected AUC: 495 SCr used: 1.67   Weight: 135 lb (61.2 kg)  Temp (24hrs), Avg:96.6 F (35.9 C), Min:96.6 F (35.9 C), Max:96.6 F (35.9 C)  Recent Labs  Lab 04/03/19 1302  WBC 22.9*  CREATININE 1.67*  LATICACIDVEN 5.8*    Estimated Creatinine Clearance: 24.8 mL/min (A) (by C-G formula based on SCr of 1.67 mg/dL (H)).    Allergies  Allergen Reactions  . Demerol [Meperidine] Other (See Comments)    Pt states she "turned green"     Antimicrobials this admission: 2/9 Ceftriaxone x 1 2/9 Vancomycin >> 2/9 Cefepime >>  Dose adjustments this admission: none  Microbiology results: 2/9 BCx/UCx: pending  2/9 Resp Panel pending  Thank you for allowing pharmacy to be a part of this patient's care.  Lu Duffel, PharmD, BCPS Clinical Pharmacist 04/03/2019 3:34 PM

## 2019-04-03 NOTE — Progress Notes (Signed)
Notified bedside nurse of need to draw repeat lactic acid.(3rd) draw. Thank you,

## 2019-04-04 DIAGNOSIS — R7881 Bacteremia: Secondary | ICD-10-CM

## 2019-04-04 DIAGNOSIS — Z885 Allergy status to narcotic agent status: Secondary | ICD-10-CM

## 2019-04-04 DIAGNOSIS — L89159 Pressure ulcer of sacral region, unspecified stage: Secondary | ICD-10-CM

## 2019-04-04 DIAGNOSIS — Z7401 Bed confinement status: Secondary | ICD-10-CM

## 2019-04-04 DIAGNOSIS — E1122 Type 2 diabetes mellitus with diabetic chronic kidney disease: Secondary | ICD-10-CM

## 2019-04-04 DIAGNOSIS — L89619 Pressure ulcer of right heel, unspecified stage: Secondary | ICD-10-CM

## 2019-04-04 DIAGNOSIS — L89629 Pressure ulcer of left heel, unspecified stage: Secondary | ICD-10-CM

## 2019-04-04 DIAGNOSIS — B952 Enterococcus as the cause of diseases classified elsewhere: Secondary | ICD-10-CM

## 2019-04-04 DIAGNOSIS — Z8673 Personal history of transient ischemic attack (TIA), and cerebral infarction without residual deficits: Secondary | ICD-10-CM

## 2019-04-04 DIAGNOSIS — L8990 Pressure ulcer of unspecified site, unspecified stage: Secondary | ICD-10-CM | POA: Insufficient documentation

## 2019-04-04 DIAGNOSIS — D638 Anemia in other chronic diseases classified elsewhere: Secondary | ICD-10-CM

## 2019-04-04 DIAGNOSIS — F17210 Nicotine dependence, cigarettes, uncomplicated: Secondary | ICD-10-CM

## 2019-04-04 DIAGNOSIS — L89319 Pressure ulcer of right buttock, unspecified stage: Secondary | ICD-10-CM

## 2019-04-04 DIAGNOSIS — N179 Acute kidney failure, unspecified: Secondary | ICD-10-CM

## 2019-04-04 DIAGNOSIS — N189 Chronic kidney disease, unspecified: Secondary | ICD-10-CM

## 2019-04-04 LAB — BASIC METABOLIC PANEL
Anion gap: 14 (ref 5–15)
BUN: 55 mg/dL — ABNORMAL HIGH (ref 8–23)
CO2: 22 mmol/L (ref 22–32)
Calcium: 8.3 mg/dL — ABNORMAL LOW (ref 8.9–10.3)
Chloride: 97 mmol/L — ABNORMAL LOW (ref 98–111)
Creatinine, Ser: 1.33 mg/dL — ABNORMAL HIGH (ref 0.44–1.00)
GFR calc Af Amer: 46 mL/min — ABNORMAL LOW (ref >60)
GFR calc non Af Amer: 39 mL/min — ABNORMAL LOW (ref >60)
Glucose, Bld: 151 mg/dL — ABNORMAL HIGH (ref 70–99)
Potassium: 4.3 mmol/L (ref 3.5–5.1)
Sodium: 133 mmol/L — ABNORMAL LOW (ref 135–145)

## 2019-04-04 LAB — BLOOD CULTURE ID PANEL (REFLEXED)

## 2019-04-04 LAB — GLUCOSE, CAPILLARY
Glucose-Capillary: 135 mg/dL — ABNORMAL HIGH (ref 70–99)
Glucose-Capillary: 163 mg/dL — ABNORMAL HIGH (ref 70–99)
Glucose-Capillary: 166 mg/dL — ABNORMAL HIGH (ref 70–99)
Glucose-Capillary: 169 mg/dL — ABNORMAL HIGH (ref 70–99)
Glucose-Capillary: 170 mg/dL — ABNORMAL HIGH (ref 70–99)
Glucose-Capillary: 178 mg/dL — ABNORMAL HIGH (ref 70–99)
Glucose-Capillary: 192 mg/dL — ABNORMAL HIGH (ref 70–99)
Glucose-Capillary: 209 mg/dL — ABNORMAL HIGH (ref 70–99)

## 2019-04-04 LAB — URINALYSIS, COMPLETE (UACMP) WITH MICROSCOPIC
Bilirubin Urine: NEGATIVE
Glucose, UA: NEGATIVE mg/dL
Hgb urine dipstick: NEGATIVE
Ketones, ur: NEGATIVE mg/dL
Leukocytes,Ua: NEGATIVE
Nitrite: NEGATIVE
Protein, ur: 30 mg/dL — AB
Specific Gravity, Urine: 1.017 (ref 1.005–1.030)
pH: 5 (ref 5.0–8.0)

## 2019-04-04 LAB — CBC
HCT: 29.4 % — ABNORMAL LOW (ref 36.0–46.0)
Hemoglobin: 9.1 g/dL — ABNORMAL LOW (ref 12.0–15.0)
MCH: 25.7 pg — ABNORMAL LOW (ref 26.0–34.0)
MCHC: 31 g/dL (ref 30.0–36.0)
MCV: 83.1 fL (ref 80.0–100.0)
Platelets: 326 10*3/uL (ref 150–400)
RBC: 3.54 MIL/uL — ABNORMAL LOW (ref 3.87–5.11)
RDW: 17.1 % — ABNORMAL HIGH (ref 11.5–15.5)
WBC: 19.2 10*3/uL — ABNORMAL HIGH (ref 4.0–10.5)
nRBC: 0 % (ref 0.0–0.2)

## 2019-04-04 LAB — LACTIC ACID, PLASMA
Lactic Acid, Venous: 1.9 mmol/L (ref 0.5–1.9)
Lactic Acid, Venous: 2 mmol/L (ref 0.5–1.9)

## 2019-04-04 LAB — CORTISOL-AM, BLOOD: Cortisol - AM: >100 ug/dL — ABNORMAL HIGH (ref 6.7–22.6)

## 2019-04-04 LAB — PROCALCITONIN: Procalcitonin: 2.4 ng/mL

## 2019-04-04 LAB — PROTIME-INR
INR: 1 (ref 0.8–1.2)
Prothrombin Time: 13.4 seconds (ref 11.4–15.2)

## 2019-04-04 MED ORDER — BUSPIRONE HCL 10 MG PO TABS
10.0000 mg | ORAL_TABLET | Freq: Two times a day (BID) | ORAL | Status: DC
  Administered 2019-04-04 – 2019-04-12 (×16): 10 mg via ORAL
  Filled 2019-04-04 (×17): qty 1

## 2019-04-04 MED ORDER — CHLORHEXIDINE GLUCONATE 0.12 % MT SOLN
15.0000 mL | Freq: Two times a day (BID) | OROMUCOSAL | Status: DC
  Administered 2019-04-04 – 2019-04-12 (×16): 15 mL via OROMUCOSAL
  Filled 2019-04-04 (×17): qty 15

## 2019-04-04 MED ORDER — CHLORHEXIDINE GLUCONATE CLOTH 2 % EX PADS
6.0000 | MEDICATED_PAD | Freq: Every day | CUTANEOUS | Status: DC
  Administered 2019-04-06: 6 via TOPICAL

## 2019-04-04 MED ORDER — SODIUM CHLORIDE 0.9 % IV SOLN
3.0000 g | Freq: Four times a day (QID) | INTRAVENOUS | Status: DC
  Administered 2019-04-04 – 2019-04-06 (×8): 3 g via INTRAVENOUS
  Filled 2019-04-04 (×2): qty 3
  Filled 2019-04-04: qty 8
  Filled 2019-04-04 (×2): qty 3
  Filled 2019-04-04: qty 8
  Filled 2019-04-04 (×2): qty 3
  Filled 2019-04-04: qty 8
  Filled 2019-04-04: qty 3
  Filled 2019-04-04: qty 8

## 2019-04-04 MED ORDER — ORAL CARE MOUTH RINSE
15.0000 mL | Freq: Two times a day (BID) | OROMUCOSAL | Status: DC
  Administered 2019-04-04 – 2019-04-05 (×3): 15 mL via OROMUCOSAL

## 2019-04-04 MED ORDER — SODIUM CHLORIDE 0.9 % IV SOLN
INTRAVENOUS | Status: DC
  Administered 2019-04-04 – 2019-04-05 (×3): 75 mL/h via INTRAVENOUS

## 2019-04-04 NOTE — Progress Notes (Signed)
PROGRESS NOTE    Miranda Ryan  [id number] DOB: 1945-05-01 DOA: 04/03/2019 PCP: [facility]      Assessment & Plan:   Active Problems:   Septic shock (HCC)   Pressure injury of skin  Septic shock: etiology unclear possibly secondary to bacteremia & decubitus ulcer. Continue on IV unasyn as per ID    Bacteremia: 2 of 2 growing gram positive cocci, sens. Continue on IV unasyn as per ID. ID consulted and recs apprec   Sacral decubitus ulcer: present on admission. Continue on IV unasyn as per ID . Gen surg consulted   Leukocytosis: likely secondary to infection. Continue on IV abxs   AKI on CKDIIIb: possibly secondary to ATN from shock. Cr is trending down today. Continue on IVFs. 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 No need for a transfusion at this time. Will continue to monitor   Metabolic acidosis: resolved  Lactic acidosis: continue on IVFs. Repeat lactic acid ordered.   Hx  recent acute ischemic stroke: with associated UE weakness/dysphagia. Continue aspirin 81mg . Was treated on last discharge with aspirin and Plavix with finishing Plavix on 1/22 per discharge summary. Continue on aspiration precautions   DM2: continue on SSI w/ accuchecks   Dementia: continue w/ supportive care  Bipolar disorder: unknown type or severity. Will continue on home dose of buspirone     DVT prophylaxis: heparin  Code Status: DNR Family Communication:  Disposition Plan:    Consultants:   ID  General surgery    Procedures:    Antimicrobials: unasyn   Subjective: Pt denies any pain but c/o fatigue   Objective: Vitals:   04/04/19 0500 04/04/19 0600 04/04/19 0700 04/04/19 0800  BP: 108/65 95/68 100/76 90/73  Pulse: 95 95 92 96  Resp: 19 (!) 22 16 (!) 25  Temp:    (!) 97.5 F (36.4 C)  TempSrc:    Axillary  SpO2: 97% 97% 97% 97%  Weight:      Height:        Intake/Output Summary (Last 24 hours) at 04/04/2019 0906 Last  data filed at 04/04/2019 0759 Gross per 24 hour  Intake 3857.33 ml  Output 350 ml  Net 3507.33 ml   Filed Weights   04/03/19 1256 04/03/19 1630  Weight: 61.2 kg 51.7 kg    Examination:  General exam: Appears calm and comfortable  Respiratory system: decreased breath sounds b/l. No wheezes  Cardiovascular system: S1 & S2 +. No  rubs, gallops or clicks.  Gastrointestinal system: Abdomen is nondistended, soft and nontender. Normal bowel sounds heard. Central nervous system: pt appears lethargic. Pt will not follow commands for a neuro exam  Psychiatry: Judgement and insight appear abnormal. Flat mood and affect      Data Reviewed: I have personally reviewed following labs and imaging studies  CBC: Recent Labs  Lab 04/03/19 1302 04/03/19 1657 04/04/19 0549  WBC 22.9* 20.6* 19.2*  NEUTROABS 20.9*  --   --   HGB 10.0* 9.3* 9.1*  HCT 33.1* 31.1* 29.4*  MCV 83.8 85.7 83.1  PLT 407* 344 [id number]   Basic Metabolic Panel: Recent Labs  Lab 04/03/19 1302 04/04/19 0549  NA 135 133*  K 4.3 4.3  CL 96* 97*  CO2 21* 22  GLUCOSE 243* 151*  BUN 58* 55*  CREATININE 1.67* 1.33*  CALCIUM 8.8* 8.3*   GFR: Estimated Creatinine Clearance: 30.3 mL/min (A) (by C-G formula based on SCr of 1.33 mg/dL (H)). Liver Function  Tests: Recent Labs  Lab 04/03/19 1302  AST 38  ALT 18  ALKPHOS 113  BILITOT 0.8  PROT 7.3  ALBUMIN 1.9*   No results for input(s): LIPASE, AMYLASE in the last 168 hours. No results for input(s): AMMONIA in the last 168 hours. Coagulation Profile: Recent Labs  Lab 04/03/19 1302 04/04/19 0549  INR 1.0 1.0   Cardiac Enzymes: No results for input(s): CKTOTAL, CKMB, CKMBINDEX, TROPONINI in the last 168 hours. BNP (last 3 results) No results for input(s): PROBNP in the last 8760 hours. HbA1C: No results for input(s): HGBA1C in the last 72 hours. CBG: Recent Labs  Lab 04/03/19 1757 04/03/19 1956 04/04/19 0042 04/04/19 0422 04/04/19 0731  GLUCAP 146* 144*  166* 178* 135*   Lipid Profile: No results for input(s): CHOL, HDL, LDLCALC, TRIG, CHOLHDL, LDLDIRECT in the last 72 hours. Thyroid Function Tests: No results for input(s): TSH, T4TOTAL, FREET4, T3FREE, THYROIDAB in the last 72 hours. Anemia Panel: No results for input(s): VITAMINB12, FOLATE, FERRITIN, TIBC, IRON, RETICCTPCT in the last 72 hours. Sepsis Labs: Recent Labs  Lab 04/03/19 1302 04/03/19 1657 04/04/19 0549  PROCALCITON 3.32  --  2.40  LATICACIDVEN 5.8* 5.9*  --     Recent Results (from the past 240 hour(s))  Blood Culture (routine x 2)     Status: None (Preliminary result)   Collection Time: 04/03/19  1:02 PM   Specimen: BLOOD  Result Value Ref Range Status   Specimen Description BLOOD RIGHT ANTECUBITAL  Final   Special Requests   Final    BOTTLES DRAWN AEROBIC AND ANAEROBIC Blood Culture adequate volume   Culture  Setup Time   Final    IN BOTH AEROBIC AND ANAEROBIC BOTTLES GRAM POSITIVE COCCI CRITICAL RESULT CALLED TO, READ BACK BY AND VERIFIED WITH: DAVID BESANTI ON 04/04/19 AT 0320 [facility] Organism ID to follow Performed at [facility], [address] [place], [place] [address]    Culture GRAM POSITIVE COCCI  Final   Report Status PENDING  Incomplete  Blood Culture (routine x 2)     Status: None (Preliminary result)   Collection Time: 04/03/19  1:08 PM   Specimen: BLOOD  Result Value Ref Range Status   Specimen Description BLOOD LEFT ANTECUBITAL  Final   Special Requests   Final    BOTTLES DRAWN AEROBIC AND ANAEROBIC Blood Culture adequate volume   Culture  Setup Time   Final    GRAM POSITIVE COCCI AEROBIC BOTTLE ONLY CRITICAL VALUE NOTED.  VALUE IS CONSISTENT WITH PREVIOUSLY REPORTED AND CALLED VALUE. Performed at [facility], [address] [place], [place] [address]    Culture [facility] POSITIVE COCCI  Final   Report Status PENDING  Incomplete  Respiratory Panel by RT PCR (Flu A&B, Covid) - Nasopharyngeal Swab     Status: None    Collection Time: 04/03/19  1:08 PM   Specimen: Nasopharyngeal Swab  Result Value Ref Range Status   SARS Coronavirus 2 by RT PCR NEGATIVE NEGATIVE Final    Comment: (NOTE) SARS-CoV-2 target nucleic acids are NOT DETECTED. The SARS-CoV-2 RNA is generally detectable in upper respiratoy specimens during the acute phase of infection. The lowest concentration of SARS-CoV-2 viral copies this assay can detect is 131 copies/mL. A negative result does not preclude SARS-Cov-2 infection and should not be used as the sole basis for treatment or other patient management decisions. A negative result may occur with  improper specimen collection/handling, submission of specimen other than nasopharyngeal swab, presence of viral  mutation(s) within the areas targeted by this assay, and inadequate number of viral copies (<131 copies/mL). A negative result must be combined with clinical observations, patient history, and epidemiological information. The expected result is Negative. Fact Sheet for Patients:  PinkCheek.be Fact Sheet for Healthcare Providers:  GravelBags.it This test is not yet ap proved or cleared by the [place] FDA and  has been authorized for detection and/or diagnosis of SARS-CoV-2 by FDA under an Emergency Use Authorization (EUA). This EUA will remain  in effect (meaning this test can be used) for the duration of the COVID-19 declaration under Section 564(b)(1) of the Act, 21 U.S.C. section 360bbb-3(b)(1), unless the authorization is terminated or revoked sooner.    Influenza A by PCR NEGATIVE NEGATIVE Final   Influenza B by PCR NEGATIVE NEGATIVE Final    Comment: (NOTE) The Xpert Xpress SARS-CoV-2/FLU/RSV assay is intended as an aid in  the diagnosis of influenza from Nasopharyngeal swab specimens and  should not be used as a sole basis for treatment. Nasal washings and  aspirates are unacceptable for Xpert Xpress  SARS-CoV-2/FLU/RSV  testing. Fact Sheet for Patients: PinkCheek.be Fact Sheet for Healthcare Providers: GravelBags.it This test is not yet approved or cleared by the [place] FDA and  has been authorized for detection and/or diagnosis of SARS-CoV-2 by  FDA under an Emergency Use Authorization (EUA). This EUA will remain  in effect (meaning this test can be used) for the duration of the  Covid-19 declaration under Section 564(b)(1) of the Act, 21  U.S.C. section 360bbb-3(b)(1), unless the authorization is  terminated or revoked. Performed at [facility], [address] [place], [place] [address]   MRSA PCR Screening     Status: None   Collection Time: 04/03/19  4:49 PM   Specimen: Nasal Mucosa; Nasopharyngeal  Result Value Ref Range Status   MRSA by PCR NEGATIVE NEGATIVE Final    Comment:        The GeneXpert MRSA Assay (FDA approved for NASAL specimens only), is one component of a comprehensive MRSA colonization surveillance program. It is not intended to diagnose MRSA infection nor to guide or monitor treatment for MRSA infections. Performed at [facility], [address] [place], [place] [address]          Radiology Studies: DG Chest [place] 1 View  Result Date: 04/03/2019 CLINICAL DATA:  Wound, weakness, COPD, dementia, diabetes mellitus EXAM: PORTABLE CHEST 1 VIEW COMPARISON:  Portable exam 1315 hours compared to 02/21/2019 FINDINGS: Normal heart size, mediastinal contours, and pulmonary vascularity. Atherosclerotic calcification aorta. Skin folds project over RIGHT chest. Lungs clear. No pulmonary infiltrate, pleural effusion or pneumothorax. Bones demineralized. IMPRESSION: No acute abnormalities. Electronically Signed   By: Lavonia Dana M.D.   On: 04/03/2019 13:27        Scheduled Meds: . aspirin  81 mg Oral Daily  . Chlorhexidine Gluconate Cloth  6 each Topical Daily  . heparin   5,000 Units Subcutaneous Q8H  . hydrocortisone sod succinate (SOLU-CORTEF) inj  50 mg Intravenous Q6H  . insulin aspart  0-9 Units Subcutaneous Q4H  . multivitamin with minerals  1 tablet Oral Daily  . sodium chloride flush  3 mL Intravenous Q12H   Continuous Infusions: . ceFEPime (MAXIPIME) IV Stopped (04/04/19 0410)  . lactated ringers 125 mL/hr at 04/04/19 0600  . [START ON 04/05/2019] vancomycin       LOS: 1 day    Time spent: 30 mins     Wyvonnia Dusky, MD  [facility] Pager [phone]  If 7PM-7AM, please contact night-coverage [email or web address] 04/04/2019, 9:06 AM

## 2019-04-04 NOTE — Consult Note (Signed)
WOC Nurse Consult Note: Reason for Consult: Unstageable PI to sacrum, bilateral heels with Deep Tissue PI (DTPI), right buttock with area of blanching erythema that is indurated. Perineum with erythema from moisture associated skin damage (IAD). Patient now with external urinary incontinence management system in place ([facility]ick). Wound type: Pressure, moisture, possible infectious Pressure Injury POA: Yes Measurement: Left heel, lateral aspect:  DTPI measuring 0.6cm x 1.6cm with no depth.  Purple/brown discoloration. No drainage. Right heel, posterior aspect: 2cm x 2.4cm area of purple/brown discoloration. No depth, no drainage. Sacrum: 6cm x 4cm area with black center, friable tissue at periphery. Scant drainage, strong odor consistent with necrotic tissue Right buttock: 2.5cm x 5cm area of blanching erythema, indurated Wound bed:As noted above Drainage (amount, consistency, odor) As noted above Periwound:intact, dry Dressing procedure/placement/frequency: Patient is on a mattress replacement with low air loss feature.  Upon transfer to floor, I have requested via the Orders that this therapy continue. I have provided bilateral pressure redistribution heel boots for the areas of DTPI. Topical care guidance is provided via the Orders for the right buttock area of induration and the Unstageable sacral pressure injury using xeroform gauze and a silicone foam dressing. Turning and repositioning is in place with time in the supine position minimized. The sacral wound has a foul odor consistent with necrotic tissue, but no drainage. This will likely evolve into a full thickness tissue injury. The area of induration on the right buttock is also suspect. If you agree, consider consult with Surgery to determine if further intervention is indicated. The small full thickness defect on the mid-back is dry and without inflammation, conservative care orders are provided.   [place] nursing team will not follow, but will  remain available to this patient, the nursing and medical teams.  Please re-consult if needed. Thanks, Maudie Flakes, MSN, RN, [place], [facility]  Pager# [phone]

## 2019-04-04 NOTE — Progress Notes (Signed)
PHARMACY - PHYSICIAN COMMUNICATION CRITICAL VALUE ALERT - BLOOD CULTURE IDENTIFICATION (BCID)  Miranda Ryan is an 74 y.o. female who presented to [facility] on 04/03/2019 with a chief complaint of diaphoresis meeting 3/4 SIRS criteria  Assessment:  Patient has 3 pressure ulcers, one sacral and one on each heel. 1/4 GPC BCID none  Name of physician (or Provider) Contacted: Rufina Falco  Current antibiotics: vanc/cefepime  Changes to prescribed antibiotics recommended:  Patient is on recommended antibiotics - No changes needed  Will continue to monitor clinical course and f/u on Cx/Sx's.  No results found for this or any previous visit.  Tobie Lords, PharmD, BCPS Clinical Pharmacist 04/04/2019  4:45 AM

## 2019-04-04 NOTE — Consult Note (Signed)
NAME: Miranda Ryan  DOB: 27-Oct-1945  MRN: [id number]  Date/Time: 04/04/2019 10:50 AM  REQUESTING PROVIDER: Jimmye Norman Subjective:  REASON FOR CONSULT: Enterococcus bacteremia ?No history from patient, spoke to daughter Miranda Ryan is a 74 y.o. female with a history of CVA, DM Recent hospitalization to [facility] between 12/30-02/27/2019 for CVA and sent to SNF ( [facility])  after that, was brought to the ED with sacral wounds as patient is bed ridden She also had fever at home. In the ED vials were 62/47, Temp 96.6, HR 117, WBC was 22.9, Hb 10, PLT 407, Cr 1.67, Glucose 243, Co2 was 21 Blood culture sent and she was started on vanco and cefepime- BCID positive for enterococcus and I am seeing the patient for the same Spoke to her daughter, who said [facility]. She was immobile and has had a pressure sore on the sacrum.Daughter was taking care of the wound. Pt apparently had drenching sweats overnight and patient was worried about the wound getting infected and she was brought to the ED in a private vehicle. Pt has not been eating well and has been on only on ensure, but she was spitting it. Pt has been deteriorating for the past 2 months Since  stroke on 02/21/19 she has not been ambulating and has  been eating poorly. Past Medical History:  Diagnosis Date  . Acute CVA (cerebrovascular accident) ([place]) 02/22/2019  . Acute exacerbation of chronic obstructive pulmonary disease (COPD) ([place]) 04/20/2016  . Anxiety   . Bipolar disorder ([place])   . Dementia ([place])   . Depression   . Diabetes mellitus, type II ([place])   . Dysphagia 02/23/2019  . Incontinence of urine   . Low serum thyroid stimulating hormone (TSH) 02/23/2019  . Severe mitral valve stenosis 02/23/2019    Past Surgical History:  Procedure Laterality Date  . CERVIX SURGERY      Social History   Socioeconomic History  . Marital status: Widowed    Spouse name: Not on file  . Number of children: Not on file  . Years of education:  Not on file  . Highest education level: Not on file  Occupational History  . Not on file  Tobacco Use  . Smoking status: Current Every Day Smoker    Packs/day: 1.00    Years: 46.00    Pack years: 46.00    Types: Cigarettes    Start date: 09/16/1964  . Smokeless tobacco: Never Used  Substance and Sexual Activity  . Alcohol use: Not Currently    Alcohol/week: 0.0 standard drinks    Comment: occasionally  . Drug use: No  . Sexual activity: Not Currently  Other Topics Concern  . Not on file  Social History Narrative  . Not on file   Social Determinants of Health   Financial Resource Strain:   . Difficulty of Paying Living Expenses: Not on file  Food Insecurity:   . Worried About Charity fundraiser in the Last Year: Not on file  . Ran Out of Food in the Last Year: Not on file  Transportation Needs:   . Lack of Transportation (Medical): Not on file  . Lack of Transportation (Non-Medical): Not on file  Physical Activity:   . Days of Exercise per Week: Not on file  . Minutes of Exercise per Session: Not on file  Stress:   . Feeling of Stress : Not on file  Social Connections:   . Frequency of Communication with Friends  and Family: Not on file  . Frequency of Social Gatherings with Friends and Family: Not on file  . Attends Religious Services: Not on file  . Active Member of Clubs or Organizations: Not on file  . Attends Archivist Meetings: Not on file  . Marital Status: Not on file  Intimate Partner Violence:   . Fear of Current or Ex-Partner: Not on file  . Emotionally Abused: Not on file  . Physically Abused: Not on file  . Sexually Abused: Not on file    Family History  Problem Relation Age of Onset  . Liver cancer Mother   . Heart attack Father   . Heart disease Sister   . Heart disease Brother   . Heart disease Brother   . Heart disease Brother    Allergies  Allergen Reactions  . Demerol [Meperidine] Other (See Comments)    Pt states she "turned  green"     ? Current Facility-Administered Medications  Medication Dose Route Frequency Provider Last Rate Last Admin  . acetaminophen (TYLENOL) tablet 650 mg  650 mg Oral Q6H PRN Nolberto Hanlon, MD       Or  . acetaminophen (TYLENOL) suppository 650 mg  650 mg Rectal Q6H PRN Nolberto Hanlon, MD      . aspirin chewable tablet 81 mg  81 mg Oral Daily Nolberto Hanlon, MD   81 mg at 04/04/19 0953  . bisacodyl (DULCOLAX) EC tablet 5 mg  5 mg Oral Daily PRN Nolberto Hanlon, MD      . ceFEPIme (MAXIPIME) 2 g in sodium chloride 0.9 % 100 mL IVPB  2 g Intravenous Q24H Lu Duffel, [facility]   Stopped at 04/04/19 0410  . Chlorhexidine Gluconate Cloth 2 % PADS 6 each  6 each Topical Daily Awilda Bill, NP      . heparin injection 5,000 Units  5,000 Units Subcutaneous Q8H Nolberto Hanlon, MD   5,000 Units at 04/04/19 0631  . hydrocortisone sodium succinate (SOLU-CORTEF) 100 MG injection 50 mg  50 mg Intravenous Q6H Nolberto Hanlon, MD   50 mg at 04/04/19 0953  . insulin aspart (novoLOG) injection 0-9 Units  0-9 Units Subcutaneous Q4H Lang Snow, NP   1 Units at 04/04/19 0747  . lactated ringers infusion   Intravenous Continuous Nolberto Hanlon, MD 125 mL/hr at 04/04/19 1000 125 mL/hr at 04/04/19 1000  . multivitamin with minerals tablet 1 tablet  1 tablet Oral Daily Nolberto Hanlon, MD   1 tablet at 04/04/19 0953  . ondansetron (ZOFRAN) tablet 4 mg  4 mg Oral Q6H PRN Nolberto Hanlon, MD       Or  . ondansetron (ZOFRAN) injection 4 mg  4 mg Intravenous Q6H PRN Nolberto Hanlon, MD      . sodium chloride flush (NS) 0.9 % injection 3 mL  3 mL Intravenous Q12H Nolberto Hanlon, MD   3 mL at 04/04/19 1001  . [START ON 04/05/2019] vancomycin (VANCOCIN) IVPB 1000 mg/200 mL premix  1,000 mg Intravenous Q48H Shanlever, Pierce Crane, RPH         Abtx:  Anti-infectives (From admission, onward)   Start     Dose/Rate Route Frequency Ordered Stop   04/05/19 1000  vancomycin (VANCOCIN) IVPB 1000 mg/200 mL premix     1,000 mg 200  mL/hr over 60 Minutes Intravenous Every 48 hours 04/03/19 1524     04/03/19 1600  ceFEPIme (MAXIPIME) 2 g in sodium chloride 0.9 % 100 mL IVPB  2 g 200 mL/hr over 30 Minutes Intravenous Every 24 hours 04/03/19 1522     04/03/19 1315  vancomycin (VANCOCIN) IVPB 1000 mg/200 mL premix     1,000 mg 200 mL/hr over 60 Minutes Intravenous  Once 04/03/19 1313 04/03/19 1459   04/03/19 1315  cefTRIAXone (ROCEPHIN) 2 g in sodium chloride 0.9 % 100 mL IVPB     2 g 200 mL/hr over 30 Minutes Intravenous  Once 04/03/19 1313 04/03/19 1422      REVIEW OF SYSTEMS:  NA Objective:  VITALS:  BP 91/69   Pulse 99   Temp (!) 97.5 F (36.4 C) (Axillary)   Resp (!) 21   Ht 5\' 4"  (1.626 m)   Wt 51.7 kg   SpO2 95%   BMI 19.56 kg/m  PHYSICAL EXAM:  General: Awake, no distress, oriented in place and person Head: Normocephalic, without obvious abnormality, atraumatic. Eyes: Conjunctivae clear, anicteric sclerae. Pupils are equal ENT Nares normal. No drainage or sinus tenderness. Lips, mucosa, and tongue normal. No Thrush Neck: Supple,  Back:sacrum - area with eschar and purple discoloration   Rt buttock area- blanching erythema      Lungs: b/l air entry Heart:s1s2 Abdomen: Soft, non-tender,not distended. Bowel sounds normal. No masses Extremities: pressure erythema both heels      Skin: No rashes or lesions. Or bruising Lymph: Cervical, supraclavicular normal. Neurologic: did not examine in detail Pertinent Labs Lab Results CBC    Component Value Date/Time   WBC 19.2 (H) 04/04/2019 0549   RBC 3.54 (L) 04/04/2019 0549   HGB 9.1 (L) 04/04/2019 0549   HCT 29.4 (L) 04/04/2019 0549   PLT 326 04/04/2019 0549   MCV 83.1 04/04/2019 0549   MCH 25.7 (L) 04/04/2019 0549   MCHC 31.0 04/04/2019 0549   RDW 17.1 (H) 04/04/2019 0549   LYMPHSABS 0.9 04/03/2019 1302   MONOABS 0.7 04/03/2019 1302   EOSABS 0.0 04/03/2019 1302   BASOSABS 0.1 04/03/2019 1302    CMP Latest Ref Rng & Units  04/04/2019 04/03/2019 02/25/2019  Glucose 70 - 99 mg/dL 151(H) 243(H) 292(H)  BUN 8 - 23 mg/dL 55(H) 58(H) 42(H)  Creatinine 0.44 - 1.00 mg/dL 1.33(H) 1.67(H) 1.23(H)  Sodium 135 - 145 mmol/L 133(L) 135 132(L)  Potassium 3.5 - 5.1 mmol/L 4.3 4.3 4.1  Chloride 98 - 111 mmol/L 97(L) 96(L) 98  CO2 22 - 32 mmol/L 22 21(L) 23  Calcium 8.9 - 10.3 mg/dL 8.3(L) 8.8(L) 8.2(L)  Total Protein 6.5 - 8.1 g/dL - 7.3 -  Total Bilirubin 0.3 - 1.2 mg/dL - 0.8 -  Alkaline Phos 38 - 126 U/L - 113 -  AST 15 - 41 U/L - 38 -  ALT 0 - 44 U/L - 18 -      Microbiology: Recent Results (from the past 240 hour(s))  Blood Culture (routine x 2)     Status: None (Preliminary result)   Collection Time: 04/03/19  1:02 PM   Specimen: BLOOD  Result Value Ref Range Status   Specimen Description BLOOD RIGHT ANTECUBITAL  Final   Special Requests   Final    BOTTLES DRAWN AEROBIC AND ANAEROBIC Blood Culture adequate volume   Culture  Setup Time   Final    IN BOTH AEROBIC AND ANAEROBIC BOTTLES GRAM POSITIVE COCCI CRITICAL RESULT CALLED TO, READ BACK BY AND VERIFIED WITH: DAVID BESANTI ON 04/04/19 AT 0320 [facility] Organism ID to follow Performed at [facility], [address] [place],  [address]    Culture  GRAM POSITIVE COCCI  Final   Report Status PENDING  Incomplete  Blood Culture ID Panel (Reflexed)     Status: Abnormal   Collection Time: 04/03/19  1:02 PM  Result Value Ref Range Status   Enterococcus species DETECTED (A) NOT DETECTED Final    Comment: CRITICAL RESULT CALLED TO, READ BACK BY AND VERIFIED WITH:  SHEEMA HALLAJI AT [id number] 04/04/19 SNG/SDR    Vancomycin resistance NOT DETECTED NOT DETECTED Final   Listeria monocytogenes NOT DETECTED NOT DETECTED Final   Staphylococcus species NOT DETECTED NOT DETECTED Final   Staphylococcus aureus (BCID) NOT DETECTED NOT DETECTED Final   Streptococcus species NOT DETECTED NOT DETECTED Final   Streptococcus agalactiae NOT DETECTED NOT DETECTED Final    Streptococcus pneumoniae NOT DETECTED NOT DETECTED Final   Streptococcus pyogenes NOT DETECTED NOT DETECTED Final   Acinetobacter baumannii NOT DETECTED NOT DETECTED Final   Enterobacteriaceae species NOT DETECTED NOT DETECTED Final   Enterobacter cloacae complex NOT DETECTED NOT DETECTED Final   Escherichia coli NOT DETECTED NOT DETECTED Final   Klebsiella oxytoca NOT DETECTED NOT DETECTED Final   Klebsiella pneumoniae NOT DETECTED NOT DETECTED Final   Proteus species NOT DETECTED NOT DETECTED Final   Serratia marcescens NOT DETECTED NOT DETECTED Final   Haemophilus influenzae NOT DETECTED NOT DETECTED Final   Neisseria meningitidis NOT DETECTED NOT DETECTED Final   Pseudomonas aeruginosa NOT DETECTED NOT DETECTED Final   Candida albicans NOT DETECTED NOT DETECTED Final   Candida glabrata NOT DETECTED NOT DETECTED Final   Candida krusei NOT DETECTED NOT DETECTED Final   Candida parapsilosis NOT DETECTED NOT DETECTED Final   Candida tropicalis NOT DETECTED NOT DETECTED Final    Comment: Performed at [facility], [address] [place], [place] [address]  Blood Culture (routine x 2)     Status: None (Preliminary result)   Collection Time: 04/03/19  1:08 PM   Specimen: BLOOD  Result Value Ref Range Status   Specimen Description BLOOD LEFT ANTECUBITAL  Final   Special Requests   Final    BOTTLES DRAWN AEROBIC AND ANAEROBIC Blood Culture adequate volume   Culture  Setup Time   Final    GRAM POSITIVE COCCI AEROBIC BOTTLE ONLY CRITICAL VALUE NOTED.  VALUE IS CONSISTENT WITH PREVIOUSLY REPORTED AND CALLED VALUE. Performed at [facility], [address] [place], [place] [address]    Culture [facility] POSITIVE COCCI  Final   Report Status PENDING  Incomplete  Respiratory Panel by RT PCR (Flu A&B, Covid) - Nasopharyngeal Swab     Status: None   Collection Time: 04/03/19  1:08 PM   Specimen: Nasopharyngeal Swab  Result Value Ref Range Status   SARS Coronavirus 2 by RT  PCR NEGATIVE NEGATIVE Final    Comment: (NOTE) SARS-CoV-2 target nucleic acids are NOT DETECTED. The SARS-CoV-2 RNA is generally detectable in upper respiratoy specimens during the acute phase of infection. The lowest concentration of SARS-CoV-2 viral copies this assay can detect is 131 copies/mL. A negative result does not preclude SARS-Cov-2 infection and should not be used as the sole basis for treatment or other patient management decisions. A negative result may occur with  improper specimen collection/handling, submission of specimen other than nasopharyngeal swab, presence of viral mutation(s) within the areas targeted by this assay, and inadequate number of viral copies (<131 copies/mL). A negative result must be combined with clinical observations, patient history, and epidemiological information. The expected result is Negative. Fact Sheet for Patients:  PinkCheek.be Fact  Sheet for Healthcare Providers:  GravelBags.it This test is not yet ap proved or cleared by the [place] FDA and  has been authorized for detection and/or diagnosis of SARS-CoV-2 by FDA under an Emergency Use Authorization (EUA). This EUA will remain  in effect (meaning this test can be used) for the duration of the COVID-19 declaration under Section 564(b)(1) of the Act, 21 U.S.C. section 360bbb-3(b)(1), unless the authorization is terminated or revoked sooner.    Influenza A by PCR NEGATIVE NEGATIVE Final   Influenza B by PCR NEGATIVE NEGATIVE Final    Comment: (NOTE) The Xpert Xpress SARS-CoV-2/FLU/RSV assay is intended as an aid in  the diagnosis of influenza from Nasopharyngeal swab specimens and  should not be used as a sole basis for treatment. Nasal washings and  aspirates are unacceptable for Xpert Xpress SARS-CoV-2/FLU/RSV  testing. Fact Sheet for Patients: PinkCheek.be Fact Sheet for Healthcare  Providers: GravelBags.it This test is not yet approved or cleared by the [place] FDA and  has been authorized for detection and/or diagnosis of SARS-CoV-2 by  FDA under an Emergency Use Authorization (EUA). This EUA will remain  in effect (meaning this test can be used) for the duration of the  Covid-19 declaration under Section 564(b)(1) of the Act, 21  U.S.C. section 360bbb-3(b)(1), unless the authorization is  terminated or revoked. Performed at [facility] [address] [place], [place] [address]   MRSA PCR Screening     Status: None   Collection Time: 04/03/19  4:49 PM   Specimen: Nasal Mucosa; Nasopharyngeal  Result Value Ref Range Status   MRSA by PCR NEGATIVE NEGATIVE Final    Comment:        The GeneXpert MRSA Assay (FDA approved for NASAL specimens only), is one component of a comprehensive MRSA colonization surveillance program. It is not intended to diagnose MRSA infection nor to guide or monitor treatment for MRSA infections. Performed at [facility], [address] [place], [place] [address]     IMAGING RESULTS:  I have personally reviewed the films ? Impression/Recommendation ? ?Enterococcus bacteremia - source could be from the sacral decubitus UC pending  But UA okay Will get 2 d echo to look for vegetation Change vanco and cefepime to unasyn leucocytosis  Decubitus wound- needs surgical evaluation for debridement and culture  AKI on CKD  Anemia of chronic disease  H/o CVA Not mobile Deterioration in condition since December Recommend palliative consult  Discussed the management with her nurse and her daughter   ?

## 2019-04-04 NOTE — Consult Note (Addendum)
[facility] SURGICAL CONSULTATION NOTE (initial) - [id number]   HISTORY OF PRESENT ILLNESS (HPI):  74 y.o. female presented to [facility] ED yesterday with her daughter who was complaining that the patient had a "fever" as she was found diaphoretic in her bed. Patient recently had a stroke in late 2020/early 2021. Patient does also with a history of dementia so history obtained primarily through chart review and discussion with family members. Patient reportedly has a known sacral decubitus ulceration and family was told to bring the patient to the ED if there are any issues. Family reported that the wound had become malodorous in the last few days and worsening. No recorded fever. No family at bedside at time of my evaluation so further history not obtained and patient was non-verbal.   On admission, she was found to have a lactic acidosis to 5.8 which improved with IVF, a leukocytosis to 22.9K which is improving with IV Abx, likely acute on chronic kidney injury with sCr 1.67 (baseline 1.09 - 1.20), BCx positive for enterococcus   Surgery is consulted by hospitalist physician Dr. Eppie Gibson, MD in this context for evaluation and management of possible infected sacral decubitus ulceration.   PAST MEDICAL HISTORY (PMH):  Past Medical History:  Diagnosis Date   Acute CVA (cerebrovascular accident) ([place]) 02/22/2019   Acute exacerbation of chronic obstructive pulmonary disease (COPD) ([place]) 04/20/2016   Anxiety    Bipolar disorder (H[facility])    Dementia (H[facility])    Depression    Diabetes mellitus, type II ([place])    Dysphagia 02/23/2019   Incontinence of urine    Low serum thyroid stimulating hormone (TSH) 02/23/2019   Severe mitral valve stenosis 02/23/2019     PAST SURGICAL HISTORY ([place]):  Past Surgical History:  Procedure Laterality Date   CERVIX SURGERY       MEDICATIONS:  Prior to Admission medications   Medication Sig Start Date End Date Taking? Authorizing Provider  aspirin EC  81 MG EC tablet Take 1 tablet (81 mg total) by mouth daily. 02/25/19  Yes Samuella Cota, MD  atorvastatin (LIPITOR) 40 MG tablet Take 1 tablet (40 mg total) by mouth daily at 6 PM. 02/24/19  Yes Samuella Cota, MD  busPIRone (BUSPAR) 10 MG tablet Take 10 mg by mouth 2 (two) times daily.   Yes [provider]  feeding supplement, ENSURE ENLIVE, (ENSURE ENLIVE) LIQD Take 237 mLs by mouth 2 (two) times daily between meals. 02/24/19  Yes Samuella Cota, MD  ibuprofen (ADVIL) 200 MG tablet Take 400 mg by mouth every 6 (six) hours as needed.   Yes [provider]  linagliptin (TRADJENTA) 5 MG TABS tablet Take 5 mg by mouth daily.   Yes [provider]  metFORMIN (GLUCOPHAGE) 500 MG tablet Take 1 tablet (500 mg total) by mouth 2 (two) times daily with a meal. 02/24/19 02/24/20 Yes Samuella Cota, MD     ALLERGIES:  Allergies  Allergen Reactions   Demerol [Meperidine] Other (See Comments)    Pt states she "turned green"      SOCIAL HISTORY:  Social History   Socioeconomic History   Marital status: Widowed    Spouse name: Not on file   Number of children: Not on file   Years of education: Not on file   Highest education level: Not on file  Occupational History   Not on file  Tobacco Use   Smoking status: Current Every Day Smoker    Packs/day: 1.00  Years: 46.00    Pack years: 46.00    Types: Cigarettes    Start date: 09/16/1964   Smokeless tobacco: Never Used  Substance and Sexual Activity   Alcohol use: Not Currently    Alcohol/week: 0.0 standard drinks    Comment: occasionally   Drug use: No   Sexual activity: Not Currently  Other Topics Concern   Not on file  Social History Narrative   Not on file   Social Determinants of Health   Financial Resource Strain:    Difficulty of Paying Living Expenses: Not on file  Food Insecurity:    Worried About Charity fundraiser in the Last Year: Not on file   [employer] of Food in the Last Year: Not on  file  Transportation Needs:    Lack of Transportation (Medical): Not on file   Lack of Transportation (Non-Medical): Not on file  Physical Activity:    Days of Exercise per Week: Not on file   Minutes of Exercise per Session: Not on file  Stress:    Feeling of Stress : Not on file  Social Connections:    Frequency of Communication with Friends and Family: Not on file   Frequency of Social Gatherings with Friends and Family: Not on file   Attends Religious Services: Not on file   Active Member of Clubs or Organizations: Not on file   Attends Archivist Meetings: Not on file   Marital Status: Not on file  Intimate Partner Violence:    Fear of Current or Ex-Partner: Not on file   Emotionally Abused: Not on file   Physically Abused: Not on file   Sexually Abused: Not on file     FAMILY HISTORY:  Family History  Problem Relation Age of Onset   Liver cancer Mother    Heart attack Father    Heart disease Sister    Heart disease Brother    Heart disease Brother    Heart disease Brother       REVIEW OF SYSTEMS:  Review of Systems  Unable to perform ROS: Dementia  Skin:       + Sacral Decubitus  All other systems reviewed and are negative.   VITAL SIGNS:  Temp:  [97.4 F (36.3 C)-98.7 F (37.1 C)] 97.5 F (36.4 C) (02/10 0800) Pulse Rate:  [88-104] 101 (02/10 1200) Resp:  [15-26] 24 (02/10 1200) BP: (77-114)/(50-91) 95/59 (02/10 1200) SpO2:  [93 %-100 %] 95 % (02/10 1200) Weight:  [51.7 kg] 51.7 kg (02/09 1630)     Height: 5\' 4"  (162.6 cm) Weight: 51.7 kg BMI (Calculated): 19.55   INTAKE/OUTPUT:  02/09 0701 - 02/10 0700 In: 3857.3 [I.V.:1457.3; IV Piggyback:2400] Out: 100 [Urine:100]  PHYSICAL EXAM:  Physical Exam Constitutional:      General: She is not in acute distress.    Appearance: She is normal weight. She is not ill-appearing.     Comments: Patient opens eyes to verbal stimuli however does not answer questions.   HENT:     Head: Normocephalic  and atraumatic.  Cardiovascular:     Rate and Rhythm: Regular rhythm. Tachycardia present.     Pulses: Normal pulses.     Heart sounds: No murmur.  Pulmonary:     Effort: Pulmonary effort is normal. No respiratory distress.     Breath sounds: Normal breath sounds.  Genitourinary:    Comments: Deferred, urinary management device present Musculoskeletal:     Right lower leg: No edema.  Left lower leg: No edema.  Skin:    General: Skin is warm and dry.       Neurological:     Mental Status: She is alert.     Comments: Opens eyes to name, does not respond to questions  Psychiatric:     Comments: Unable to assess      Sacral Wound (04/04/2019):     Right Hip (04/03/2019):       Labs:  CBC Latest Ref Rng & Units 04/04/2019 04/03/2019 04/03/2019  WBC 4.0 - 10.5 K/uL 19.2(H) 20.6(H) 22.9(H)  Hemoglobin 12.0 - 15.0 g/dL 9.1(L) 9.3(L) 10.0(L)  Hematocrit 36.0 - 46.0 % 29.4(L) 31.1(L) 33.1(L)  Platelets 150 - 400 K/uL 326 344 407(H)   CMP Latest Ref Rng & Units 04/04/2019 04/03/2019 02/25/2019  Glucose 70 - 99 mg/dL 151(H) 243(H) 292(H)  BUN 8 - 23 mg/dL 55(H) 58(H) 42(H)  Creatinine 0.44 - 1.00 mg/dL 1.33(H) 1.67(H) 1.23(H)  Sodium 135 - 145 mmol/L 133(L) 135 132(L)  Potassium 3.5 - 5.1 mmol/L 4.3 4.3 4.1  Chloride 98 - 111 mmol/L 97(L) 96(L) 98  CO2 22 - 32 mmol/L 22 21(L) 23  Calcium 8.9 - 10.3 mg/dL 8.3(L) 8.8(L) 8.2(L)  Total Protein 6.5 - 8.1 g/dL - 7.3 -  Total Bilirubin 0.3 - 1.2 mg/dL - 0.8 -  Alkaline Phos 38 - 126 U/L - 113 -  AST 15 - 41 U/L - 38 -  ALT 0 - 44 U/L - 18 -     Imaging studies:  No new pertinent imaging studies   Assessment/Plan: (ICD-10's: L89.150) 74 y.o. female with unstageable pressure injury to the sacrum given overlying eschar (although likely Stage II/III underneath), complicated by dementia, recent CVA, and multiple comorbid conditions   - Patient will likely benefit from bedside debridement of eschar on sacrum if she can tolerate it.  We will plan to do this tomorrow 02/11. No family at bedside, so we will discuss with patient's daughter in the morning.    - Continue IV ABx (Unasyn); Follow up BCx  - Continue local wound care, frequent repositioning, consider low air loss mattress   - Further management per primary service; we will follow   Thank you for the opportunity to participate in this patient's care.   -- Edison Simon, PA-C [facility] 04/04/2019, 1:00 PM [phone] M-F: 7am - 4pm  I saw and evaluated the patient.  I agree with the above documentation, exam, and plan, which I have edited where appropriate. I agree that this wound is unlikely to be the source of the patient's sepsis.  Fredirick Maudlin  12:00 PM

## 2019-04-04 NOTE — Evaluation (Addendum)
Clinical/Bedside Swallow Evaluation Patient Details  Name: Miranda Ryan MRN: [id number] Date of Birth: Jun 28, 1945  Today's Date: 04/04/2019 Time: SLP Start Time (ACUTE ONLY): 0910 SLP Stop Time (ACUTE ONLY): 1010 SLP Time Calculation (min) (ACUTE ONLY): 60 min  Past Medical History:  Past Medical History:  Diagnosis Date  . Acute CVA (cerebrovascular accident) ([place]) 02/22/2019  . Acute exacerbation of chronic obstructive pulmonary disease (COPD) ([place]) 04/20/2016  . Anxiety   . Bipolar disorder ([place])   . Dementia ([place])   . Depression   . Diabetes mellitus, type II ([place])   . Dysphagia 02/23/2019  . Incontinence of urine   . Low serum thyroid stimulating hormone (TSH) 02/23/2019  . Severe mitral valve stenosis 02/23/2019   Past Surgical History:  Past Surgical History:  Procedure Laterality Date  . CERVIX SURGERY     HPI:  Pt is a 74 y.o. female with a PMH including Bipolar Dis., Dementia, anxiety, Urinary Incontinence w/ UTI, COPD, stroke in 01/2019, Severe mitral valve stenosis brought in by daughter for concern for wound on buttocks, diaphoresis, weakness.  Daughter reports patient had a stroke in December, was cared for at [facility] for period of time recently transitioned home on 03/19/2019 and daughter has been caring for her.  Patient does have a Bedsore which daughter is been caring for which has been worsening recently.  Reported fever this morning.  Patient is unable to provide any history due to Baseline Dementia.  Patient had been nonambulatory since stroke in Dec. 2020 w/ increased weakness and decreased po intake back in Dec. 2020 prior to that admission per daughter.   Assessment / Plan / Recommendation Clinical Impression  Pt appears to present w/ adequate oropharyngeal phase swallow function (Min Oral phase Hesitation w/ increased textured po's noted inconsistently) w/ reduced risk for prandial aspiration when following general aspiration precautions; and given tray setup  and support at meals d/t Cognitive decline (at Baseline, pt has a dx of Dementia and Bipolar Dis.). Pt is also Edentulous at baseline - unsure if consistently wears Dentures. Pt requires assistance at meals for setup and eating/drinking encouragement d/t Cognitive decline. Pt was supported in sitting upright/forward then presentated w/ trials of thin liquids, purees, ice chips. No overt clinical s/s of aspiration noted during/post trials; no decline in vocal quality, respiratory status during/post trials. HR 105, RR 21, O2 sats 96%. Oral phase appeared grossly [facility] for bolus management of thin liquid trials consumed -- timely bolus manipulation and A-P transfer w/ full oral clearing b/t trials noted. W/ trials of increased textured po's, pt exhibited Min Hesitation/oral holding during A-P transfer -- w/ gentle verbal cues and Timd, pt completed A-P transfer/swallow and exhibited full oral clearing. Pt does Not have her Dentures present so trials of solid foods were not given. Pt supported w/ setup for self-feeding but was often DISTRACTED EASILY. OM exam was [facility] w/ no unilateral weakness noted.  Recommend a Puree diet consistency d/t Edentulous status and declined Cognitive status/Dementia baseline; Thin liquids via Cup/Straw w/ monitoring. Rec. general aspiration precautions - reduce distractions at meals; tray setup and feeding support at meals. Monitoring of needs at meals d/t baseline Cognitive decline/Dementia. Rec. Dietician f/u for nutritional support as Dementia can impact pt's overal desire/focus on foods/oral intake. If pt is able to wear her Dentures again, suspect she can return to her baseline diet (soft foods CUT WELL for her for easier chewing) - must ensure secure fit, OR, remain on a Pureed diet for easier  intake overall.  CXR: "No acute abnormalities".  SLP Visit Diagnosis: Dysphagia, oral phase (R13.11)(baseline Edentulous status; Cognitive decline)    Aspiration Risk  Risk for inadequate  nutrition/hydration(reduced aspiration risk w/ general precautions)    Diet Recommendation  Dysphagia level 1 (puree w/ gravies) w/ Thin liquids; general aspiration precautions; feeding support at meals as needed d/t Cognitive decline  Medication Administration: Crushed with puree(for safer swallowing)    Other  Recommendations Recommended Consults: (Dietician f/u) Oral Care Recommendations: Oral care BID;Oral care before and after PO;Staff/trained caregiver to provide oral care Other Recommendations: (n/a)   Follow up Recommendations None      Frequency and Duration min 1 x/week  1 week       Prognosis Prognosis for Safe Diet Advancement: Fair Barriers to Reach Goals: Cognitive deficits;Time post onset;Severity of deficits Barriers/Prognosis Comment: baseline Dementia      Swallow Study   General Date of Onset: 04/03/19 HPI: Pt is a 74 y.o. female with a PMH including Bipolar Dis., Dementia, anxiety, Urinary Incontinence w/ UTI, COPD, stroke in 01/2019, Severe mitral valve stenosis brought in by daughter for concern for wound on buttocks, diaphoresis, weakness.  Daughter reports patient had a stroke in December, was cared for at [facility] for period of time recently transitioned home on 03/19/2019 and daughter has been caring for her.  Patient does have a Bedsore which daughter is been caring for which has been worsening recently.  Reported fever this morning.  Patient is unable to provide any history due to Baseline Dementia.  Patient had been nonambulatory since stroke in Dec. 2020 w/ increased weakness and decreased po intake back in Dec. 2020 prior to that admission per daughter. Type of Study: Bedside Swallow Evaluation Previous Swallow Assessment: 01/2019 Diet Prior to this Study: Dysphagia 1 (puree);Thin liquids(recommended at last d/c d/t Cognitive status, dentition lack) Temperature Spikes Noted: No(wbc 19.2 declining from admission) Respiratory Status: Room air History  of Recent Intubation: No Behavior/Cognition: Alert;Cooperative;Pleasant mood;Confused;Distractible;Requires cueing(baseline Dementia) Oral Cavity Assessment: Dry Oral Care Completed by SLP: Yes Oral Cavity - Dentition: Edentulous Vision: Functional for self-feeding Self-Feeding Abilities: Able to feed self;Needs assist;Needs set up;Total assist Patient Positioning: Upright in bed(needed positioning support) Baseline Vocal Quality: Normal(adequate - minimal verbal engagement) Volitional Cough: Cognitively unable to elicit Volitional Swallow: Unable to elicit    Oral/Motor/Sensory Function Overall Oral Motor/Sensory Function: Within functional limits(functional at baseline)   Ice Chips Ice chips: Within functional limits Presentation: Spoon(fed; 3 trials) Other Comments: adequate bolus management   Thin Liquid Thin Liquid: Within functional limits Presentation: Cup;Self Fed;Straw(supported; ~6 ozs total) Other Comments: water, juice, ensure    Nectar Thick Nectar Thick Liquid: Not tested   Honey Thick Honey Thick Liquid: Not tested   Puree Puree: Within functional limits Presentation: Spoon;Self Fed(supported; 10 trials)   Solid     Solid: Not tested Other Comments: d/t Cognitive decline/status baseline; Edentulous status        Orinda Kenner, MS, CCC-SLP Vona Whiters 04/04/2019,12:37 PM

## 2019-04-05 ENCOUNTER — Encounter: Payer: Self-pay | Admitting: Internal Medicine

## 2019-04-05 ENCOUNTER — Inpatient Hospital Stay (HOSPITAL_COMMUNITY)
Admit: 2019-04-05 | Discharge: 2019-04-05 | Disposition: A | Discharge status: Home / Self Care | Payer: Medicare Other | Attending: Infectious Diseases | Admitting: Infectious Diseases

## 2019-04-05 DIAGNOSIS — R7881 Bacteremia: Secondary | ICD-10-CM

## 2019-04-05 DIAGNOSIS — Z7189 Other specified counseling: Secondary | ICD-10-CM

## 2019-04-05 DIAGNOSIS — Z515 Encounter for palliative care: Secondary | ICD-10-CM

## 2019-04-05 DIAGNOSIS — E440 Moderate protein-calorie malnutrition: Secondary | ICD-10-CM | POA: Insufficient documentation

## 2019-04-05 LAB — COMPREHENSIVE METABOLIC PANEL
ALT: 17 U/L (ref 0–44)
AST: 22 U/L (ref 15–41)
Albumin: 1.7 g/dL — ABNORMAL LOW (ref 3.5–5.0)
Alkaline Phosphatase: 77 U/L (ref 38–126)
Anion gap: 10 (ref 5–15)
BUN: 44 mg/dL — ABNORMAL HIGH (ref 8–23)
CO2: 25 mmol/L (ref 22–32)
Calcium: 8.1 mg/dL — ABNORMAL LOW (ref 8.9–10.3)
Chloride: 103 mmol/L (ref 98–111)
Creatinine, Ser: 1.06 mg/dL — ABNORMAL HIGH (ref 0.44–1.00)
GFR calc Af Amer: 60 mL/min — ABNORMAL LOW (ref >60)
GFR calc non Af Amer: 52 mL/min — ABNORMAL LOW (ref >60)
Glucose, Bld: 146 mg/dL — ABNORMAL HIGH (ref 70–99)
Potassium: 2.5 mmol/L — CL (ref 3.5–5.1)
Sodium: 138 mmol/L (ref 135–145)
Total Bilirubin: 0.4 mg/dL (ref 0.3–1.2)
Total Protein: 6 g/dL — ABNORMAL LOW (ref 6.5–8.1)

## 2019-04-05 LAB — CBC
HCT: 25.6 % — ABNORMAL LOW (ref 36.0–46.0)
Hemoglobin: 8 g/dL — ABNORMAL LOW (ref 12.0–15.0)
MCH: 25.6 pg — ABNORMAL LOW (ref 26.0–34.0)
MCHC: 31.3 g/dL (ref 30.0–36.0)
MCV: 82.1 fL (ref 80.0–100.0)
Platelets: 288 10*3/uL (ref 150–400)
RBC: 3.12 MIL/uL — ABNORMAL LOW (ref 3.87–5.11)
RDW: 17.1 % — ABNORMAL HIGH (ref 11.5–15.5)
WBC: 15.3 10*3/uL — ABNORMAL HIGH (ref 4.0–10.5)
nRBC: 0 % (ref 0.0–0.2)

## 2019-04-05 LAB — URINE CULTURE: Culture: NO GROWTH

## 2019-04-05 LAB — LACTIC ACID, PLASMA: Lactic Acid, Venous: 1.4 mmol/L (ref 0.5–1.9)

## 2019-04-05 LAB — ECHOCARDIOGRAM COMPLETE
Height: 64 in
Weight: 1823.65 oz

## 2019-04-05 LAB — GLUCOSE, CAPILLARY
Glucose-Capillary: 116 mg/dL — ABNORMAL HIGH (ref 70–99)
Glucose-Capillary: 143 mg/dL — ABNORMAL HIGH (ref 70–99)
Glucose-Capillary: 161 mg/dL — ABNORMAL HIGH (ref 70–99)
Glucose-Capillary: 190 mg/dL — ABNORMAL HIGH (ref 70–99)
Glucose-Capillary: 246 mg/dL — ABNORMAL HIGH (ref 70–99)
Glucose-Capillary: 287 mg/dL — ABNORMAL HIGH (ref 70–99)

## 2019-04-05 LAB — POTASSIUM: Potassium: 3.1 mmol/L — ABNORMAL LOW (ref 3.5–5.1)

## 2019-04-05 LAB — MAGNESIUM: Magnesium: 1.6 mg/dL — ABNORMAL LOW (ref 1.7–2.4)

## 2019-04-05 LAB — PHOSPHORUS: Phosphorus: 3.1 mg/dL (ref 2.5–4.6)

## 2019-04-05 MED ORDER — SILVER NITRATE-POT NITRATE 75-25 % EX MISC
10.0000 | CUTANEOUS | Status: DC | PRN
  Filled 2019-04-05: qty 10

## 2019-04-05 MED ORDER — LACTATED RINGERS IV SOLN: INTRAVENOUS | Status: DC

## 2019-04-05 MED ORDER — SODIUM CHLORIDE 0.9 % IV SOLN
INTRAVENOUS | Status: DC | PRN
  Administered 2019-04-05: 500 mL via INTRAVENOUS
  Administered 2019-04-11 (×2): 30 mL via INTRAVENOUS
  Administered 2019-04-11: 50 mL via INTRAVENOUS
  Administered 2019-04-11 – 2019-04-12 (×2): 30 mL via INTRAVENOUS

## 2019-04-05 MED ORDER — LIDOCAINE-EPINEPHRINE 1 %-1:100000 IJ SOLN
20.0000 mL | Freq: Once | INTRAMUSCULAR | Status: DC
  Filled 2019-04-05: qty 20

## 2019-04-05 MED ORDER — POTASSIUM CHLORIDE 10 MEQ/100ML IV SOLN
10.0000 meq | INTRAVENOUS | Status: AC
  Administered 2019-04-05 (×4): 10 meq via INTRAVENOUS
  Filled 2019-04-05 (×4): qty 100

## 2019-04-05 MED ORDER — POTASSIUM CHLORIDE 10 MEQ/100ML IV SOLN
10.0000 meq | INTRAVENOUS | Status: AC
  Administered 2019-04-05 (×2): 10 meq via INTRAVENOUS
  Filled 2019-04-05 (×2): qty 100

## 2019-04-05 MED ORDER — MAGNESIUM SULFATE 4 GM/100ML IV SOLN
4.0000 g | Freq: Once | INTRAVENOUS | Status: AC
  Administered 2019-04-05: 4 g via INTRAVENOUS
  Filled 2019-04-05: qty 100

## 2019-04-05 MED ORDER — POTASSIUM CHLORIDE CRYS ER 20 MEQ PO TBCR: 40.0000 meq | EXTENDED_RELEASE_TABLET | ORAL | Status: DC

## 2019-04-05 MED ORDER — POTASSIUM CHLORIDE 2 MEQ/ML IV SOLN
INTRAVENOUS | Status: DC
  Filled 2019-04-05 (×3): qty 1000

## 2019-04-05 MED ORDER — ENSURE ENLIVE PO LIQD
237.0000 mL | Freq: Two times a day (BID) | ORAL | Status: DC
  Administered 2019-04-05 – 2019-04-06 (×3): 237 mL via ORAL

## 2019-04-05 MED ORDER — SODIUM CHLORIDE 0.9 % IV BOLUS
1000.0000 mL | Freq: Once | INTRAVENOUS | Status: AC
  Administered 2019-04-05: 1000 mL via INTRAVENOUS

## 2019-04-05 NOTE — Progress Notes (Signed)
Initial Nutrition Assessment  DOCUMENTATION CODES:   Non-severe (moderate) malnutrition in context of chronic illness  INTERVENTION:  Provide Ensure Enlive po BID, each supplement provides 350 kcal and 20 grams of protein.  Continue daily MVI.  NUTRITION DIAGNOSIS:   Moderate Malnutrition related to chronic illness(COPD, dementia, dysphagia) as evidenced by moderate fat depletion, moderate muscle depletion.  GOAL:   Patient will meet greater than or equal to 90% of their needs  MONITOR:   PO intake, Supplement acceptance, Labs, Weight trends, Skin, I & O's  REASON FOR ASSESSMENT:   Consult Poor PO, Wound healing  ASSESSMENT:   74 year old female with PMHx of bipolar disorder, anxiety, depression, DM, COPD, dementia, hx CVA, dysphagia, sacral decubitus ulcer admitted with septic shock, bacteremia, leukocytosis, AKI on CKD stage III.   Met with patient at bedside in the ICU. No family members present at time of RD assessment. Patient was not able to provide any nutrition/weight history. She was put on a dysphagia 1 diet with thin liquids on 2/9. She had finishing most of her pureed pancakes (75%) and pureed sausage (75%) and had eaten about 50% of her pureed pineapples.  Medications reviewed and include: Solu-Cortef 50 mg Q6hrs IV, Novolog 0-9 units Q4hrs, MVI daily, Unasyn, LR at 75 mL/hr, magnesium sulfate 4 grams IV once today, potassium chloride 10 mEq IV 4 times today.  Labs reviewed: CBG 116-209, Potassium 2.5, BUN 44, Creatinine 1.06, Magnesium 1.6.  NUTRITION - FOCUSED PHYSICAL EXAM:    Most Recent Value  Orbital Region  Moderate depletion  Upper Arm Region  Moderate depletion  Thoracic and Lumbar Region  Moderate depletion  Buccal Region  Moderate depletion  Temple Region  Moderate depletion  Clavicle Bone Region  Moderate depletion  Clavicle and Acromion Bone Region  Moderate depletion  Scapular Bone Region  Unable to assess  Dorsal Hand  Moderate depletion   Patellar Region  Moderate depletion  Anterior Thigh Region  Moderate depletion  Posterior Calf Region  Moderate depletion  Edema (RD Assessment)  Mild  Hair  Reviewed  Eyes  Unable to assess  Mouth  Unable to assess  Skin  Reviewed  Nails  Reviewed     Diet Order:   Diet Order            DIET - DYS 1 Room service appropriate? Yes; Fluid consistency: Thin  Diet effective now             EDUCATION NEEDS:   No education needs have been identified at this time  Skin:  Skin Assessment: Skin Integrity Issues: Skin Integrity Issues:: DTI, Stage II DTI: bilateral heels Stage II: sacrum (5.5cm x 4cm x 0.5cm)  Last BM:  04/05/2019 small type 4  Height:   Ht Readings from Last 1 Encounters:  04/03/19 '5\' 4"'$  (1.626 m)   Weight:   Wt Readings from Last 1 Encounters:  04/03/19 51.7 kg   Ideal Body Weight:  54.5 kg  BMI:  Body mass index is 19.56 kg/m.  Estimated Nutritional Needs:   Kcal:  1400-1600  Protein:  70-80 grams  Fluid:  1.4-1.6 L/day  Jacklynn Barnacle, MS, RD, LDN Pager number available on Amion

## 2019-04-05 NOTE — Progress Notes (Signed)
PROGRESS NOTE    Miranda Ryan  [id number] DOB: 1945-06-16 DOA: 04/03/2019 PCP: [place]      Assessment & Plan:   Active Problems:   Septic shock (HCC)   Pressure injury of skin   Bacteremia   Goals of care, counseling/discussion   Palliative care by specialist   Encounter for hospice care discussion   Malnutrition of moderate degree  Septic shock: etiology unclear possibly secondary to bacteremia & decubitus ulcer. Continue on IV unasyn as per ID . ICU consulted and recs apprec   Bacteremia: 2 of 2 growing enterococcus, sens. Continue on IV unasyn as per ID. ID following and recs apprec   Sacral decubitus ulcer stage II/III: present on admission. Continue on IV unasyn as per ID. Will have debridement today as per gen surg. Gen surg following and recs apprec   Leukocytosis: likely secondary to infection. Continue on IV abxs   AKI on CKDIIIb: possibly secondary to ATN from shock. Cr continues to trend down. Continue on IVFs. 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 No need for a transfusion at this time. Will continue to monitor   Hypokalemia: KCl repleated. Will continue to monitor   Hypomagnesemia: mg sulfate given. Will continue to monitor   Moderate malnutrition: continue w/ supplements   Metabolic acidosis: resolved  Lactic acidosis: resolved  Hx  recent acute ischemic stroke: with associated UE weakness/dysphagia. Continue aspirin 81mg . Was treated on last discharge with aspirin and Plavix with finishing Plavix on 1/22 per discharge summary. Continue on aspiration precautions   DM2: continue on SSI w/ accuchecks   Dementia: continue w/ supportive care  Bipolar disorder: unknown type or severity. Will continue on home dose of buspirone     DVT prophylaxis: heparin  Code Status: DNR Family Communication:  Disposition Plan: pt's daughter, [name], is considering palliative care/hospice but has not made up her mind  yet    Consultants:   ID  General surgery   ICU  Palliative care   Procedures:    Antimicrobials: unasyn   Subjective: Pt c/o weakness    Objective: Vitals:   04/05/19 1300 04/05/19 1400 04/05/19 1500 04/05/19 1600  BP: 93/66 94/65 102/60 107/66  Pulse:  93 89 90  Resp: 16 17 18  (!) 25  Temp:  97.7 F (36.5 C)    TempSrc:  Axillary    SpO2:  96% 97% 99%  Weight:      Height:        Intake/Output Summary (Last 24 hours) at 04/05/2019 1650 Last data filed at 04/05/2019 1400 Gross per 24 hour  Intake 2409.42 ml  Output 1150 ml  Net 1259.42 ml   Filed Weights   04/03/19 1256 04/03/19 1630  Weight: 61.2 kg 51.7 kg    Examination:  General exam: Appears calm and comfortable  Respiratory system: diminished breath sounds b/l. No rales Cardiovascular system: S1 & S2 +. No  rubs, gallops or clicks.  Gastrointestinal system: Abdomen is nondistended, soft and nontender. Hypoactive bowel sounds heard. Central nervous system: Awake, oriented to person, place. Moves all 4 extremities Psychiatry: Judgement and insight appear abnormal. Flat mood and affect      Data Reviewed: I have personally reviewed following labs and imaging studies  CBC: Recent Labs  Lab 04/03/19 1302 04/03/19 1657 04/04/19 0549 04/05/19 0905  WBC 22.9* 20.6* 19.2* 15.3*  NEUTROABS 20.9*  --   --   --   HGB 10.0* 9.3* 9.1* 8.0*  HCT  33.1* 31.1* 29.4* 25.6*  MCV 83.8 85.7 83.1 82.1  PLT 407* 344 326 [id number]   Basic Metabolic Panel: Recent Labs  Lab 04/03/19 1302 04/04/19 0549 04/05/19 0905  NA 135 133* 138  K 4.3 4.3 2.5*  CL 96* 97* 103  CO2 21* 22 25  GLUCOSE 243* 151* 146*  BUN 58* 55* 44*  CREATININE 1.67* 1.33* 1.06*  CALCIUM 8.8* 8.3* 8.1*  MG  --   --  1.6*  PHOS  --   --  3.1   GFR: Estimated Creatinine Clearance: 38 mL/min (A) (by C-G formula based on SCr of 1.06 mg/dL (H)). Liver Function Tests: Recent Labs  Lab 04/03/19 1302 04/05/19 0905  AST 38 22  ALT 18  17  ALKPHOS 113 77  BILITOT 0.8 0.4  PROT 7.3 6.0*  ALBUMIN 1.9* 1.7*   No results for input(s): LIPASE, AMYLASE in the last 168 hours. No results for input(s): AMMONIA in the last 168 hours. Coagulation Profile: Recent Labs  Lab 04/03/19 1302 04/04/19 0549  INR 1.0 1.0   Cardiac Enzymes: No results for input(s): CKTOTAL, CKMB, CKMBINDEX, TROPONINI in the last 168 hours. BNP (last 3 results) No results for input(s): PROBNP in the last 8760 hours. HbA1C: No results for input(s): HGBA1C in the last 72 hours. CBG: Recent Labs  Lab 04/04/19 1950 04/04/19 2337 04/05/19 0357 04/05/19 0714 04/05/19 1113  GLUCAP 209* 192* 143* 116* 161*   Lipid Profile: No results for input(s): CHOL, HDL, LDLCALC, TRIG, CHOLHDL, LDLDIRECT in the last 72 hours. Thyroid Function Tests: No results for input(s): TSH, T4TOTAL, FREET4, T3FREE, THYROIDAB in the last 72 hours. Anemia Panel: No results for input(s): VITAMINB12, FOLATE, FERRITIN, TIBC, IRON, RETICCTPCT in the last 72 hours. Sepsis Labs: Recent Labs  Lab 04/03/19 1302 04/03/19 1302 04/03/19 1657 04/04/19 0549 04/04/19 0942 04/04/19 1234 04/05/19 0905  PROCALCITON 3.32  --   --  2.40  --   --   --   LATICACIDVEN 5.8*   < > 5.9*  --  1.9 2.0* 1.4   < > = values in this interval not displayed.    Recent Results (from the past 240 hour(s))  Blood Culture (routine x 2)     Status: Abnormal (Preliminary result)   Collection Time: 04/03/19  1:02 PM   Specimen: BLOOD  Result Value Ref Range Status   Specimen Description   Final    BLOOD RIGHT ANTECUBITAL Performed at [facility], [address] [place], [place] [address]    Special Requests   Final    BOTTLES DRAWN AEROBIC AND ANAEROBIC Blood Culture adequate volume Performed at [facility], [address] [place],  [address]    Culture  Setup Time   Final    IN BOTH AEROBIC AND ANAEROBIC BOTTLES GRAM POSITIVE COCCI CRITICAL RESULT CALLED TO,  READ BACK BY AND VERIFIED WITH: DAVID BESANTI ON 04/04/19 AT 0320 [facility]    Culture ENTEROCOCCUS FAECALIS (A)  Final   Report Status PENDING  Incomplete  Blood Culture ID Panel (Reflexed)     Status: Abnormal   Collection Time: 04/03/19  1:02 PM  Result Value Ref Range Status   Enterococcus species DETECTED (A) NOT DETECTED Final    Comment: CRITICAL RESULT CALLED TO, READ BACK BY AND VERIFIED WITH:  [facility] HALLAJI AT [id number] 04/04/19 SNG/SDR    Vancomycin resistance NOT DETECTED NOT DETECTED Final   Listeria monocytogenes NOT DETECTED NOT DETECTED Final   Staphylococcus species NOT DETECTED NOT DETECTED  Final   Staphylococcus aureus (BCID) NOT DETECTED NOT DETECTED Final   Streptococcus species NOT DETECTED NOT DETECTED Final   Streptococcus agalactiae NOT DETECTED NOT DETECTED Final   Streptococcus pneumoniae NOT DETECTED NOT DETECTED Final   Streptococcus pyogenes NOT DETECTED NOT DETECTED Final   Acinetobacter baumannii NOT DETECTED NOT DETECTED Final   Enterobacteriaceae species NOT DETECTED NOT DETECTED Final   Enterobacter cloacae complex NOT DETECTED NOT DETECTED Final   Escherichia coli NOT DETECTED NOT DETECTED Final   Klebsiella oxytoca NOT DETECTED NOT DETECTED Final   Klebsiella pneumoniae NOT DETECTED NOT DETECTED Final   Proteus species NOT DETECTED NOT DETECTED Final   Serratia marcescens NOT DETECTED NOT DETECTED Final   Haemophilus influenzae NOT DETECTED NOT DETECTED Final   Neisseria meningitidis NOT DETECTED NOT DETECTED Final   Pseudomonas aeruginosa NOT DETECTED NOT DETECTED Final   Candida albicans NOT DETECTED NOT DETECTED Final   Candida glabrata NOT DETECTED NOT DETECTED Final   Candida krusei NOT DETECTED NOT DETECTED Final   Candida parapsilosis NOT DETECTED NOT DETECTED Final   Candida tropicalis NOT DETECTED NOT DETECTED Final    Comment: Performed at [facility], [address] [place], [place] [address]  Blood Culture (routine x 2)     Status:  Abnormal (Preliminary result)   Collection Time: 04/03/19  1:08 PM   Specimen: BLOOD  Result Value Ref Range Status   Specimen Description   Final    BLOOD LEFT ANTECUBITAL Performed at [facility], [address], [place], [place] [address]    Special Requests   Final    BOTTLES DRAWN AEROBIC AND ANAEROBIC Blood Culture adequate volume Performed at [facility], [address] [place], [place] [address]    Culture  Setup Time   Final    GRAM POSITIVE COCCI AEROBIC BOTTLE ONLY CRITICAL VALUE NOTED.  VALUE IS CONSISTENT WITH PREVIOUSLY REPORTED AND CALLED VALUE. Performed at [facility], [address], [place], [place] [address]    Culture ENTEROCOCCUS FAECALIS (A)  Final   Report Status PENDING  Incomplete  Respiratory Panel by RT PCR (Flu A&B, Covid) - Nasopharyngeal Swab     Status: None   Collection Time: 04/03/19  1:08 PM   Specimen: Nasopharyngeal Swab  Result Value Ref Range Status   SARS Coronavirus 2 by RT PCR NEGATIVE NEGATIVE Final    Comment: (NOTE) SARS-CoV-2 target nucleic acids are NOT DETECTED. The SARS-CoV-2 RNA is generally detectable in upper respiratoy specimens during the acute phase of infection. The lowest concentration of SARS-CoV-2 viral copies this assay can detect is 131 copies/mL. A negative result does not preclude SARS-Cov-2 infection and should not be used as the sole basis for treatment or other patient management decisions. A negative result may occur with  improper specimen collection/handling, submission of specimen other than nasopharyngeal swab, presence of viral mutation(s) within the areas targeted by this assay, and inadequate number of viral copies (<131 copies/mL). A negative result must be combined with clinical observations, patient history, and epidemiological information. The expected result is Negative. Fact Sheet for Patients:  PinkCheek.be Fact Sheet for Healthcare  Providers:  GravelBags.it This test is not yet ap proved or cleared by the [place] FDA and  has been authorized for detection and/or diagnosis of SARS-CoV-2 by FDA under an Emergency Use Authorization (EUA). This EUA will remain  in effect (meaning this test can be used) for the duration of the COVID-19 declaration under Section 564(b)(1) of the Act,  21 U.S.C. section 360bbb-3(b)(1), unless the authorization is terminated or revoked sooner.    Influenza A by PCR NEGATIVE NEGATIVE Final   Influenza B by PCR NEGATIVE NEGATIVE Final    Comment: (NOTE) The Xpert Xpress SARS-CoV-2/FLU/RSV assay is intended as an aid in  the diagnosis of influenza from Nasopharyngeal swab specimens and  should not be used as a sole basis for treatment. Nasal washings and  aspirates are unacceptable for Xpert Xpress SARS-CoV-2/FLU/RSV  testing. Fact Sheet for Patients: PinkCheek.be Fact Sheet for Healthcare Providers: GravelBags.it This test is not yet approved or cleared by the [place] FDA and  has been authorized for detection and/or diagnosis of SARS-CoV-2 by  FDA under an Emergency Use Authorization (EUA). This EUA will remain  in effect (meaning this test can be used) for the duration of the  Covid-19 declaration under Section 564(b)(1) of the Act, 21  U.S.C. section 360bbb-3(b)(1), unless the authorization is  terminated or revoked. Performed at [facility], [address] [place], [place] [address]   MRSA PCR Screening     Status: None   Collection Time: 04/03/19  4:49 PM   Specimen: Nasal Mucosa; Nasopharyngeal  Result Value Ref Range Status   MRSA by PCR NEGATIVE NEGATIVE Final    Comment:        The GeneXpert MRSA Assay (FDA approved for NASAL specimens only), is one component of a comprehensive MRSA colonization surveillance program. It is not intended to diagnose MRSA infection  nor to guide or monitor treatment for MRSA infections. Performed at [facility], [address], [place], [place] [address]   Urine culture     Status: None   Collection Time: 04/04/19  7:57 AM   Specimen: In/Out Cath Urine  Result Value Ref Range Status   Specimen Description   Final    IN/OUT CATH URINE Performed at [facility], [address] [place], [place] [address]    Special Requests   Final    NONE Performed at [facility], [address], [place], [place] [address]    Culture   Final    NO GROWTH Performed at [facility], [place] [address], [place],  [address]    Report Status 04/05/2019 FINAL  Final         Radiology Studies: No results found.      Scheduled Meds: . aspirin  81 mg Oral Daily  . busPIRone  10 mg Oral BID  . chlorhexidine  15 mL Mouth Rinse BID  . Chlorhexidine Gluconate Cloth  6 each Topical Daily  . feeding supplement (ENSURE ENLIVE)  237 mL Oral BID BM  . heparin  5,000 Units Subcutaneous Q8H  . hydrocortisone sod succinate (SOLU-CORTEF) inj  50 mg Intravenous Q6H  . insulin aspart  0-9 Units Subcutaneous Q4H  . lidocaine-EPINEPHrine  20 mL Intradermal Once  . mouth rinse  15 mL Mouth Rinse q12n4p  . multivitamin with minerals  1 tablet Oral Daily  . sodium chloride flush  3 mL Intravenous Q12H   Continuous Infusions: . ampicillin-sulbactam (UNASYN) IV 3 g (04/05/19 1442)  . lactated ringers with kcl 75 mL/hr at 04/05/19 1355  . potassium chloride 10 mEq (04/05/19 1601)     LOS: 2 days    Time spent: 30 mins     Wyvonnia Dusky, MD [facility] Pager [phone]  If 7PM-7AM, please contact night-coverage [email or web address] 04/05/2019, 4:50 PM

## 2019-04-05 NOTE — Progress Notes (Addendum)
[facility] SURGICAL PROGRESS NOTE (cpt [phone])  Hospital Day(s): 2.    Interval History: Patient seen and examined, no acute events or new complaints overnight. Unable to obtain history secondary to dementia and non-verbal status.   Leukocytosis improved to 15K; afebrile Hypokalemia to 2.5 Renal function improved; UO - 1450 ccs Lactic acid levels normalized  Review of Systems:  Unable to obtain ROS secondary to dementia and non-verbal status   Vital signs in last 24 hours: [min-max] current  Temp:  [97.5 F (36.4 C)-98 F (36.7 C)] 97.9 F (36.6 C) (02/11 0800) Pulse Rate:  [73-101] 87 (02/11 0800) Resp:  [14-24] 16 (02/11 0800) BP: (76-114)/(49-70) 95/59 (02/11 0800) SpO2:  [93 %-100 %] 96 % (02/11 0800)     Height: 5\' 4"  (162.6 cm) Weight: 51.7 kg BMI (Calculated): 19.55   Intake/Output last 2 shifts:  02/10 0701 - 02/11 0700 In: 2646.3 [I.V.:2346.3; IV Piggyback:300] Out: 1450 [Urine:1450]   Physical Exam:  Constitutional: alert, no distress, resting comfortably in bed, non-verbal  HENT: normocephalic without obvious abnormality  Eyes: PERRL, EOM's grossly intact and symmetric  Respiratory: breathing non-labored at rest  Cardiovascular: tachycardia and sinus rhythm  Integumentary: eschar debrided at bedside, underlying Stage II relatively superficial ulceration of the sacrum, there is no purulent, fluctuance, or necrotic tissue appreciable.    Labs:  CBC Latest Ref Rng & Units 04/05/2019 04/04/2019 04/03/2019  WBC 4.0 - 10.5 K/uL 15.3(H) 19.2(H) 20.6(H)  Hemoglobin 12.0 - 15.0 g/dL 8.0(L) 9.1(L) 9.3(L)  Hematocrit 36.0 - 46.0 % 25.6(L) 29.4(L) 31.1(L)  Platelets 150 - 400 K/uL 288 326 344   CMP Latest Ref Rng & Units 04/05/2019 04/04/2019 04/03/2019  Glucose 70 - 99 mg/dL 146(H) 151(H) 243(H)  BUN 8 - 23 mg/dL 44(H) 55(H) 58(H)  Creatinine 0.44 - 1.00 mg/dL 1.06(H) 1.33(H) 1.67(H)  Sodium 135 - 145 mmol/L 138 133(L) 135  Potassium 3.5 - 5.1 mmol/L  2.5(LL) 4.3 4.3  Chloride 98 - 111 mmol/L 103 97(L) 96(L)  CO2 22 - 32 mmol/L 25 22 21(L)  Calcium 8.9 - 10.3 mg/dL 8.1(L) 8.3(L) 8.8(L)  Total Protein 6.5 - 8.1 g/dL 6.0(L) - 7.3  Total Bilirubin 0.3 - 1.2 mg/dL 0.4 - 0.8  Alkaline Phos 38 - 126 U/L 77 - 113  AST 15 - 41 U/L 22 - 38  ALT 0 - 44 U/L 17 - 18    Imaging studies: No new pertinent imaging studies    PROCEDURES:  Procedure: Sharp Excisional Debridement  Indication: Unstageable decubitus ulceration of the sacrum  Preforming Provider: Dr Fredirick Maudlin, MD  Anesthesia: ~10 ccs of 1% lidocaine with epinephrine intradermally  Details of procedure: Consent was obtained via phone with the patient's daughter [name]). The patient was positioned appropriately and 10 ccs of lidocaine with epinephrine was injected intradermally and anesthesia was achieved. Using forceps and a 10 blade the eschar was sharply debrided which revealed underlying stage II ulceration. No purulence or necrosis appreciable. The patient tolerated this well without immediate complication. All sharps were accounted for and disposed of.   Complications: None    Assessment/Plan: (ICD-10's: L89.152) 74 y.o. female with stage II sacral decubitus ulceration s/p sharp excisional debridement of eschar today, complicated by dementia, recent CVA, and multiple comorbid conditions   - Sacral ulceration was debrided at bedside; see above  - Consent obtained on phone with patients daughter and RN  - Continue local wound care; start collagenase BID; frequent repositioning  - I do not appreciate any infection or  necrosis of this ulceration and do not suspect this is the source of her sepsis; continue management of this per primary and consulting teams   All of the above findings and recommendations were discussed with the patient, patient's family (Daughter via phone), and the medical team, and all of the patient's daughter's questions were answered to her expressed  satisfaction.  -- Edison Simon, PA-C [facility] 04/05/2019, 11:35 AM [phone] M-F: 7am - 4pm  I saw and evaluated the patient.  I agree with the above documentation, exam, and plan, which I have edited where appropriate. I personally performed the bedside debridement.  Fredirick Maudlin  7:18 AM

## 2019-04-05 NOTE — Progress Notes (Signed)
*  PRELIMINARY RESULTS* Echocardiogram 2D Echocardiogram has been performed.  Miranda Ryan 04/05/2019, 10:26 AM

## 2019-04-05 NOTE — Progress Notes (Signed)
Pharmacy Electrolyte Monitoring Consult:  Pharmacy consulted to assist in monitoring and replacing electrolytes in this 74 y.o. female admitted on 04/03/2019 with sacral decubitis ulcer and resulting enterococcus faecalis bacteremia.   Labs:  Sodium (mmol/L)  Date Value  04/05/2019 138   Potassium (mmol/L)  Date Value  04/05/2019 3.1 (L)   Magnesium (mg/dL)  Date Value  04/05/2019 1.6 (L)   Phosphorus (mg/dL)  Date Value  04/05/2019 3.1   Calcium (mg/dL)  Date Value  04/05/2019 8.1 (L)   Albumin (g/dL)  Date Value  04/05/2019 1.7 (L)   Corrected Calcium: 9.9   Assessment/Plan: Will order potassium 77mEq IV Q1hr x 2 doses. Patient transitioned to LR/Potassium 28mEq at 79mL/hr.   Will recheck all electrolytes with am labs.   Replace for goal potassium ~ 4 and goal magnesum ~ 2.   Pharmacy will continue to monitor and adjust per consult.   Miranda Ryan 04/05/2019 7:15 PM

## 2019-04-05 NOTE — Consult Note (Signed)
Consultation Note Date: 04/05/2019   Patient Name: Miranda Ryan  DOB: [birth year]  MRN: [id number]  Age / Sex: 74 y.o., female  PCP: [place] Referring Physician: Wyvonnia Dusky, MD  Reason for Consultation: Establishing goals of care  HPI/Patient Profile: 74 y.o. female  with past medical history of acute CVA December 2020 , COPD, bipolar disorder, dementia, anxiety and depression, DM, urinary incontinence, severe mitral valve stenosis, thyroid disease admitted on 04/03/2019 with Enterococcus bacteremia.   Clinical Assessment and Goals of Care: I have reviewed medical records including EPIC notes, labs and imaging, received report from attending and nursing staff, examined the patient and call to daughter, Miranda Ryan to discuss diagnosis prognosis, GOC, EOL wishes, disposition and options.  I introduced Palliative Medicine as specialized medical care for people living with serious illness. It focuses on providing relief from the symptoms and stress of a serious illness.   We discussed a brief life review of the patient.  Miranda Ryan shares that Miranda Ryan's husband died in 2007/06/14.  She was diagnosed with dementia about 1 year ago.  She lives with her daughter.  Miranda Ryan tearfully shares that Miranda Ryan used to dance, go out to eat into the theater and loved cruises.  As far as functional and nutritional status, Miranda Ryan shares that recently Miranda Ryan has started to spit out her food, and she is not surprised that her nutritional status has worsened.  We discussed her current illness and what it means in the larger context of her on-going co-morbidities.  Natural disease trajectory and expectations at EOL were discussed.  Miranda Ryan is very tearful throughout our conversation stating that she understands that blood infection, especially in the elderly, will likely lead to Miranda Ryan's passing.  I  attempted to elicit values and goals of care important to the patient.  Miranda Ryan tearfully states, "she does not want to be there".  She shares that her mother did not want to be rehospitalized, she is ready to go be with her deceased husband.   The difference between aggressive medical intervention and comfort care was considered in light of the patient's goals of care.   Advanced directives, concepts specific to code status, artifical feeding and hydration, and rehospitalization were considered and discussed.  Miranda Ryan endorses DNR status  Residential hospice ideas and concepts were explained and offered.  Miranda Ryan states that she feels hospice care is best for her mother, but she would like to reach out to her brother and her mother's brothers for support.  Questions and concerns were addressed.  PMT to call back in 45 to 60 minutes.  Return call to daughter/surrogate decision maker, Miranda Ryan.  Miranda Ryan shares that she was able to speak with her brother and her mother's brothers who all shared that they wanted her to make decisions related to treatment versus comfort care.  Miranda Ryan shares that she just lost her brother in law Dec 3rd dt liver failure.  Miranda Ryan remains tearful and talking about choices.  She states that she would like  results from echocardiogram, would agree to further invasive testing such as TEE if needed.   She asks for time overnight to consider what is next. I encouraged Miranda Ryan to think about what this time looks like and feels like for Miranda Ryan.  Miranda Ryan repeatedly states "I want a clear conscience".  PMT to follow tomorrow.   HCPOA   NEXT OF KIN -daughter, Miranda Ryan    SUMMARY OF RECOMMENDATIONS   Continue to treat the treatable.  Agreeable to further testing, even invasive testing as needed.   Code Status/Advance Care Planning:  DNR  Symptom Management:   Per hospitalist, no additional needs at this time.  Palliative Prophylaxis:   Palliative Wound Care and  Turn Reposition  Additional Recommendations (Limitations, Scope, Preferences):  Treat the treatable but no CPR no intubation  Psycho-social/Spiritual:   Desire for further Chaplaincy support:no  Additional Recommendations: Caregiving  Support/Resources and Education on Hospice  Prognosis:   Unable to determine, based on patient/family choice for direction of care.  3 to 6 months or less would not be surprising even with full scope treatment.  2 weeks or less would not be surprising with full comfort care based on albumin of 1.7, bacteremia, decreasing functional status and frailty.  Discharge Planning: To be determined, based on outcomes/patient and family choice.      Primary Diagnoses: Present on Admission: . Septic shock ([place])   I have reviewed the medical record, interviewed the patient and family, and examined the patient. The following aspects are pertinent.  Past Medical History:  Diagnosis Date  . Acute CVA (cerebrovascular accident) ([place]) 02/22/2019  . Acute exacerbation of chronic obstructive pulmonary disease (COPD) ([place]) 04/20/2016  . Anxiety   . Bipolar disorder ([place])   . Dementia ([place])   . Depression   . Diabetes mellitus, type II ([place])   . Dysphagia 02/23/2019  . Incontinence of urine   . Low serum thyroid stimulating hormone (TSH) 02/23/2019  . Severe mitral valve stenosis 02/23/2019   Social History   Socioeconomic History  . Marital status: Widowed    Spouse name: Not on file  . Number of children: Not on file  . Years of education: Not on file  . Highest education level: Not on file  Occupational History  . Not on file  Tobacco Use  . Smoking status: Current Every Day Smoker    Packs/day: 1.00    Years: 46.00    Pack years: 46.00    Types: Cigarettes    Start date: 09/16/1964  . Smokeless tobacco: Never Used  Substance and Sexual Activity  . Alcohol use: Not Currently    Alcohol/week: 0.0 standard drinks    Comment: occasionally  . Drug use:  No  . Sexual activity: Not Currently  Other Topics Concern  . Not on file  Social History Narrative  . Not on file   Social Determinants of Health   Financial Resource Strain:   . Difficulty of Paying Living Expenses: Not on file  Food Insecurity:   . Worried About Charity fundraiser in the Last Year: Not on file  . Ran Out of Food in the Last Year: Not on file  Transportation Needs:   . Lack of Transportation (Medical): Not on file  . Lack of Transportation (Non-Medical): Not on file  Physical Activity:   . Days of Exercise per Week: Not on file  . Minutes of Exercise per Session: Not on file  Stress:   . Feeling of Stress :  Not on file  Social Connections:   . Frequency of Communication with Friends and Family: Not on file  . Frequency of Social Gatherings with Friends and Family: Not on file  . Attends Religious Services: Not on file  . Active Member of Clubs or Organizations: Not on file  . Attends Archivist Meetings: Not on file  . Marital Status: Not on file   Family History  Problem Relation Age of Onset  . Liver cancer Mother   . Heart attack Father   . Heart disease Sister   . Heart disease Brother   . Heart disease Brother   . Heart disease Brother    Scheduled Meds: . aspirin  81 mg Oral Daily  . busPIRone  10 mg Oral BID  . chlorhexidine  15 mL Mouth Rinse BID  . Chlorhexidine Gluconate Cloth  6 each Topical Daily  . heparin  5,000 Units Subcutaneous Q8H  . hydrocortisone sod succinate (SOLU-CORTEF) inj  50 mg Intravenous Q6H  . insulin aspart  0-9 Units Subcutaneous Q4H  . lidocaine-EPINEPHrine  20 mL Intradermal Once  . mouth rinse  15 mL Mouth Rinse q12n4p  . multivitamin with minerals  1 tablet Oral Daily  . sodium chloride flush  3 mL Intravenous Q12H   Continuous Infusions: . ampicillin-sulbactam (UNASYN) IV Stopped (04/05/19 0806)  . lactated ringers with kcl    . magnesium sulfate bolus IVPB 45 mL/hr at 04/05/19 1240  . potassium  chloride 10 mEq (04/05/19 1353)   PRN Meds:.acetaminophen **OR** acetaminophen, bisacodyl, ondansetron **OR** ondansetron (ZOFRAN) IV, silver nitrate applicators Medications Prior to Admission:  Prior to Admission medications   Medication Sig Start Date End Date Taking? Authorizing Provider  aspirin EC 81 MG EC tablet Take 1 tablet (81 mg total) by mouth daily. 02/25/19  Yes Samuella Cota, MD  atorvastatin (LIPITOR) 40 MG tablet Take 1 tablet (40 mg total) by mouth daily at 6 PM. 02/24/19  Yes Samuella Cota, MD  busPIRone (BUSPAR) 10 MG tablet Take 10 mg by mouth 2 (two) times daily.   Yes [provider]  feeding supplement, ENSURE ENLIVE, (ENSURE ENLIVE) LIQD Take 237 mLs by mouth 2 (two) times daily between meals. 02/24/19  Yes Samuella Cota, MD  ibuprofen (ADVIL) 200 MG tablet Take 400 mg by mouth every 6 (six) hours as needed.   Yes [provider]  linagliptin (TRADJENTA) 5 MG TABS tablet Take 5 mg by mouth daily.   Yes [provider]  metFORMIN (GLUCOPHAGE) 500 MG tablet Take 1 tablet (500 mg total) by mouth 2 (two) times daily with a meal. 02/24/19 02/24/20 Yes Samuella Cota, MD   Allergies  Allergen Reactions  . Demerol [Meperidine] Other (See Comments)    Pt states she "turned green"    Review of Systems  Unable to perform ROS: Mental status change    Physical Exam Vitals and nursing note reviewed.  Constitutional:      General: She is not in acute distress.    Appearance: She is ill-appearing.  Cardiovascular:     Rate and Rhythm: Tachycardia present.  Pulmonary:     Effort: Pulmonary effort is normal. No respiratory distress.  Abdominal:     General: Abdomen is flat. There is no distension.  Skin:    General: Skin is warm and dry.  Neurological:     Comments: Oriented to self only.   Psychiatric:     Comments: Calm and cooperative, not fearful.  Vital Signs: BP (!) 84/56   Pulse (!) 140   Temp 97.9 F (36.6 C)  (Axillary)   Resp 19   Ht 5\' 4"  (1.626 m)   Wt 51.7 kg   SpO2 95%   BMI 19.56 kg/m  Pain Scale: PAINAD   Pain Score: 0-No pain   SpO2: SpO2: 95 % O2 Device:SpO2: 95 % O2 Flow Rate: .   IO: Intake/output summary:   Intake/Output Summary (Last 24 hours) at 04/05/2019 1354 Last data filed at 04/05/2019 1240 Gross per 24 hour  Intake 3529.94 ml  Output 1450 ml  Net 2079.94 ml    LBM: Last BM Date: 04/04/19 Baseline Weight: Weight: 61.2 kg Most recent weight: Weight: 51.7 kg     Palliative Assessment/Data:   Flowsheet Rows     Most Recent Value  Intake Tab  Referral Department  Hospitalist  Unit at Time of Referral  Intermediate Care Unit  Palliative Care Primary Diagnosis  Sepsis/Infectious Disease  Date Notified  04/05/19  Palliative Care Type  New Palliative care  Reason for referral  Clarify Goals of Care  Date of Admission  04/03/19  Date first seen by Palliative Care  04/05/19  # of days Palliative referral response time  0 Day(s)  # of days IP prior to Palliative referral  2  Clinical Assessment  Palliative Performance Scale Score  20%  Pain Max last 24 hours  Not able to report  Pain Min Last 24 hours  Not able to report  Dyspnea Max Last 24 Hours  Not able to report  Dyspnea Min Last 24 hours  Not able to report  Psychosocial & Spiritual Assessment  Palliative Care Outcomes      Time In: 1300 Time Out: 1430 Time Total: 90 minutes, extended time Greater than 50%  of this time was spent counseling and coordinating care related to the above assessment and plan.  Signed by: Drue Novel, NP   Please contact Palliative Medicine Team phone at [phone] for questions and concerns.  For individual provider: See Shea Evans

## 2019-04-05 NOTE — Progress Notes (Signed)
Pharmacy Electrolyte Monitoring Consult:  Pharmacy consulted to assist in monitoring and replacing electrolytes in this 74 y.o. female admitted on 04/03/2019 with sacral decubitis ulcer and resulting enterococcus faecalis bacteremia.   Labs:  Sodium (mmol/L)  Date Value  04/05/2019 138   Potassium (mmol/L)  Date Value  04/05/2019 2.5 (LL)   Magnesium (mg/dL)  Date Value  04/05/2019 1.6 (L)   Phosphorus (mg/dL)  Date Value  04/05/2019 3.1   Calcium (mg/dL)  Date Value  04/05/2019 8.1 (L)   Albumin (g/dL)  Date Value  04/05/2019 1.7 (L)   Corrected Calcium: 9.9   Assessment/Plan: Will order potassium 35mEq IV Q1hr x 4 doses and magnesium 4g IV x 1. Patient transitioned to LR/Potassium 37mEq at 40mL/hr.   Will obtain potassium at 1800. All electrolytes with am labs.   Replace for goal potassium ~ 4 and goal magnesum ~ 2.   Pharmacy will continue to monitor and adjust per consult.   Terrie Haring L 04/05/2019 11:42 AM

## 2019-04-05 NOTE — Consult Note (Signed)
CRITICAL CARE PROGRESS NOTE    Name: Miranda Ryan MRN: [id number] DOB: March 30, 1945     LOS: 2   SUBJECTIVE FINDINGS & SIGNIFICANT EVENTS   Patient description:  This is a pleasant [age]ogenarian she has a complicated history including moderate protein calorie malnutrition with a background history of COPD dementia and chronic dysphagia, has been at home with most of the care provided by her daughter, she had a history of CVA in the past and has significant cardiac medical history including mitral valve stenosis which is severe, chronic urinary incontinence, diabetes anxiety and depression on top of her dementia, she also has thyroid disease.  She was diagnosed with enterococcal bacteremia 2 days ago on April 03, 2019.  She has a significant stage II sacral decubitus ulcer which was evaluated by surgical team and had sharp excisional debridement of eschar with no residual infection or necrosis remaining.  She was initially placed on stepdown service in the medical intensive care unit with hospitalist primary however patient was noted to have significant hypotension today with SBP is less than 70 and critical care consultation was placed for additional evaluation management.   Lines / Drains: PIV x3  Cultures / Sepsis markers: Blood culture positive with Enterococcus  Antibiotics: Vancomycin and cefepime changing to Unasyn as per ID   Protocols / Consultants: Hospitalist, surgery, ID, PCCM  Tests / Events: Transthoracic echo  Overnight: 04/05/19-patient found profoundly hypotensive and tachycardic with SBP 70s and HR >120.    PAST MEDICAL HISTORY   Past Medical History:  Diagnosis Date  . Acute CVA (cerebrovascular accident) ([place]) 02/22/2019  . Acute exacerbation of chronic obstructive pulmonary disease  (COPD) ([place]) 04/20/2016  . Anxiety   . Bipolar disorder ([place])   . Dementia ([place])   . Depression   . Diabetes mellitus, type II ([place])   . Dysphagia 02/23/2019  . Incontinence of urine   . Low serum thyroid stimulating hormone (TSH) 02/23/2019  . Severe mitral valve stenosis 02/23/2019     SURGICAL HISTORY   Past Surgical History:  Procedure Laterality Date  . CERVIX SURGERY       FAMILY HISTORY   Family History  Problem Relation Age of Onset  . Liver cancer Mother   . Heart attack Father   . Heart disease Sister   . Heart disease Brother   . Heart disease Brother   . Heart disease Brother      SOCIAL HISTORY   Social History   Tobacco Use  . Smoking status: Current Every Day Smoker    Packs/day: 1.00    Years: 46.00    Pack years: 46.00    Types: Cigarettes    Start date: 09/16/1964  . Smokeless tobacco: Never Used  Substance Use Topics  . Alcohol use: Not Currently    Alcohol/week: 0.0 standard drinks    Comment: occasionally  . Drug use: No     MEDICATIONS   Current Medication:  Current Facility-Administered Medications:  .  0.9 %  sodium chloride infusion, , Intravenous, Continuous, Wyvonnia Dusky, MD, Last Rate: 75 mL/hr at 04/05/19 0734, 75 mL/hr at 04/05/19 0734 .  acetaminophen (TYLENOL) tablet 650 mg, 650 mg, Oral, Q6H PRN **OR** acetaminophen (TYLENOL) suppository 650 mg, 650 mg, Rectal, Q6H PRN, Kurtis Bushman, Sahar, MD .  Ampicillin-Sulbactam (UNASYN) 3 g in sodium chloride 0.9 % 100 mL IVPB, 3 g, Intravenous, Q6H, Ravishankar, Jayashree, MD, Last Rate: 200 mL/hr at 04/05/19 0736, 3 g at 04/05/19 0736 .  aspirin  chewable tablet 81 mg, 81 mg, Oral, Daily, Nolberto Hanlon, MD, 81 mg at 04/04/19 0953 .  bisacodyl (DULCOLAX) EC tablet 5 mg, 5 mg, Oral, Daily PRN, Nolberto Hanlon, MD .  busPIRone (BUSPAR) tablet 10 mg, 10 mg, Oral, BID, Wyvonnia Dusky, MD, 10 mg at 04/04/19 2232 .  chlorhexidine (PERIDEX) 0.12 % solution 15 mL, 15 mL, Mouth Rinse, BID,  Wyvonnia Dusky, MD, 15 mL at 04/04/19 2232 .  Chlorhexidine Gluconate Cloth 2 % PADS 6 each, 6 each, Topical, Daily, Blakeney, Dana G, NP .  heparin injection 5,000 Units, 5,000 Units, Subcutaneous, Q8H, Amery, Sahar, MD, 5,000 Units at 04/05/19 0614 .  hydrocortisone sodium succinate (SOLU-CORTEF) 100 MG injection 50 mg, 50 mg, Intravenous, Q6H, Amery, Sahar, MD, 50 mg at 04/05/19 0404 .  insulin aspart (novoLOG) injection 0-9 Units, 0-9 Units, Subcutaneous, Q4H, [place], Bing Neighbors, NP, 1 Units at 04/05/19 0404 .  lidocaine-EPINEPHrine (XYLOCAINE W/EPI) 1 %-1:100000 (with pres) injection 20 mL, 20 mL, Intradermal, Once, Tylene Fantasia, PA-C .  MEDLINE mouth rinse, 15 mL, Mouth Rinse, q12n4p, Wyvonnia Dusky, MD, 15 mL at 04/04/19 1619 .  multivitamin with minerals tablet 1 tablet, 1 tablet, Oral, Daily, Nolberto Hanlon, MD, 1 tablet at 04/04/19 0953 .  ondansetron (ZOFRAN) tablet 4 mg, 4 mg, Oral, Q6H PRN **OR** ondansetron (ZOFRAN) injection 4 mg, 4 mg, Intravenous, Q6H PRN, Nolberto Hanlon, MD .  silver nitrate applicators applicator 10 Stick, 10 Stick, Topical, PRN, Edison Simon R, PA-C .  sodium chloride flush (NS) 0.9 % injection 3 mL, 3 mL, Intravenous, Q12H, Nolberto Hanlon, MD, 3 mL at 04/04/19 2232    ALLERGIES   Demerol [meperidine]    REVIEW OF SYSTEMS     10 point ROS unable to conduct due to dementia  PHYSICAL EXAMINATION   Vital Signs: Temp:  [97.5 F (36.4 C)-98 F (36.7 C)] 97.9 F (36.6 C) (02/11 0800) Pulse Rate:  [73-102] 87 (02/11 0800) Resp:  [14-26] 16 (02/11 0800) BP: (76-114)/(49-70) 95/59 (02/11 0800) SpO2:  [93 %-100 %] 96 % (02/11 0800)  GENERAL: Chronically ill-appearing in no apparent distress HEAD: Normocephalic, atraumatic.  EYES: Pupils equal, round, reactive to light.  No scleral icterus.  MOUTH: Moist mucosal membrane. NECK: Supple. No thyromegaly. No nodules. No JVD.  PULMONARY: Clear to auscultation  bilaterally CARDIOVASCULAR: S1 and S2. Regular rate and rhythm. No murmurs, rubs, or gallops.  GASTROINTESTINAL: Soft, nontender, non-distended. No masses. Positive bowel sounds. No hepatosplenomegaly.  MUSCULOSKELETAL: No swelling, clubbing, or edema.  NEUROLOGIC: Mild distress due to acute illness SKIN: Erythema of heels of both lower extremities, sacral decubitus ulcer stage II status post debridement   PERTINENT DATA     Infusions: . sodium chloride 75 mL/hr (04/05/19 0734)  . ampicillin-sulbactam (UNASYN) IV 3 g (04/05/19 0736)   Scheduled Medications: . aspirin  81 mg Oral Daily  . busPIRone  10 mg Oral BID  . chlorhexidine  15 mL Mouth Rinse BID  . Chlorhexidine Gluconate Cloth  6 each Topical Daily  . heparin  5,000 Units Subcutaneous Q8H  . hydrocortisone sod succinate (SOLU-CORTEF) inj  50 mg Intravenous Q6H  . insulin aspart  0-9 Units Subcutaneous Q4H  . lidocaine-EPINEPHrine  20 mL Intradermal Once  . mouth rinse  15 mL Mouth Rinse q12n4p  . multivitamin with minerals  1 tablet Oral Daily  . sodium chloride flush  3 mL Intravenous Q12H   PRN Medications: acetaminophen **OR** acetaminophen, bisacodyl, ondansetron **OR** ondansetron (ZOFRAN) IV, silver nitrate  applicators Hemodynamic parameters:   Intake/Output: 02/10 0701 - 02/11 0700 In: 2646.3 [I.V.:2346.3; IV Piggyback:300] Out: [id number] [Urine:1450]  Ventilator  Settings:      LAB RESULTS:  Basic Metabolic Panel: Recent Labs  Lab 04/03/19 1302 04/04/19 0549  NA 135 133*  K 4.3 4.3  CL 96* 97*  CO2 21* 22  GLUCOSE 243* 151*  BUN 58* 55*  CREATININE 1.67* 1.33*  CALCIUM 8.8* 8.3*   Liver Function Tests: Recent Labs  Lab 04/03/19 1302  AST 38  ALT 18  ALKPHOS 113  BILITOT 0.8  PROT 7.3  ALBUMIN 1.9*   No results for input(s): LIPASE, AMYLASE in the last 168 hours. No results for input(s): AMMONIA in the last 168 hours. CBC: Recent Labs  Lab 04/03/19 1302 04/03/19 1657 04/04/19 0549  04/05/19 0905  WBC 22.9* 20.6* 19.2* 15.3*  NEUTROABS 20.9*  --   --   --   HGB 10.0* 9.3* 9.1* 8.0*  HCT 33.1* 31.1* 29.4* 25.6*  MCV 83.8 85.7 83.1 82.1  PLT 407* 344 326 288   Cardiac Enzymes: No results for input(s): CKTOTAL, CKMB, CKMBINDEX, TROPONINI in the last 168 hours. BNP: Invalid input(s): POCBNP CBG: Recent Labs  Lab 04/04/19 1614 04/04/19 1950 04/04/19 2337 04/05/19 0357 04/05/19 0714  GLUCAP 163* 209* 192* 143* 116*     IMAGING RESULTS:  Imaging: DG Chest Port 1 View  Result Date: 04/03/2019 CLINICAL DATA:  Wound, weakness, COPD, dementia, diabetes mellitus EXAM: PORTABLE CHEST 1 VIEW COMPARISON:  Portable exam 1315 hours compared to 02/21/2019 FINDINGS: Normal heart size, mediastinal contours, and pulmonary vascularity. Atherosclerotic calcification aorta. Skin folds project over RIGHT chest. Lungs clear. No pulmonary infiltrate, pleural effusion or pneumothorax. Bones demineralized. IMPRESSION: No acute abnormalities. Electronically Signed   By: Lavonia Dana M.D.   On: 04/03/2019 13:27     ASSESSMENT AND PLAN    -Multidisciplinary rounds held today    Septic shock  -Due to enterococcal bacteremia with a sacral decubitus ulcer stage II status post debridement  -ID on case-appreciate input switching antibiotics to Unasyn -Close monitoring of hemodynamics -Vasopressors for MAP of less than 65 -SoluCortef 50 q6h ICU monitoring -Palliative care consultation today    Moderate protein calorie malnutrition -RD dietary consultation    Renal Failure acute on chronic  -follow chem 7 -follow UO -continue Foley Catheter-assess need daily -DC nonessential nephrotoxic medications   NEUROLOGY -Advanced dementia as well as depression and anxiety disorder -s/p CVA with bedbound state at baseline  ID -continue IV abx as prescibed -follow up cultures  GI/Nutrition GI PROPHYLAXIS as indicated DIET-->TF's as tolerated Constipation protocol as  indicated  ENDO - ICU hypoglycemic\Hyperglycemia protocol -check FSBS per protocol   ELECTROLYTES -follow labs as needed -replace as needed -pharmacy consultation   DVT/GI PRX ordered -SCDs  TRANSFUSIONS AS NEEDED MONITOR FSBS ASSESS the need for LABS as needed   Critical care provider statement:    Critical care time (minutes):  33   Critical care time was exclusive of:  Separately billable procedures and treating other patients   Critical care was necessary to treat or prevent imminent or life-threatening deterioration of the following conditions:  septic shock, sacral decubitus, bactermia, multiple comorbid conditions   Critical care was time spent personally by me on the following activities:  Development of treatment plan with patient or surrogate, discussions with consultants, evaluation of patient's response to treatment, examination of patient, obtaining history from patient or surrogate, ordering and performing treatments and interventions, ordering and review  of laboratory studies and re-evaluation of patient's condition.  I assumed direction of critical care for this patient from another provider in my specialty: no    This document was prepared using Dragon voice recognition software and may include unintentional dictation errors.    Ottie Glazier, M.D.  Division of [place] [facility]

## 2019-04-05 NOTE — Progress Notes (Signed)
[place] visited pt. on ICU after consulting briefly w/palliative care NP and MD following ICU rounds.  Pt. reportedly lives w/dtr. who has been significant caregiver and who adamently does not want pt. rehospitalized if she is discharged.  [place] will monitor the need for chaplain support for family. Pt. sitting up in bed when [facility] arrived; awake and able to acknowledge [facility] presence but not apparently able to respond to questions; [place] offered a brief prayer for pt.'s health and for an awareness of divine presence with her in hospital.  No needs expressed at this time.      04/05/19 1125  Clinical Encounter Type  Visited With Patient;Health care provider  Visit Type Initial;Psychological support;Spiritual support;Social support;Critical Care  Referral From Palliative care team;Other (Comment) (Rounding)  Consult/Referral To Chaplain  Spiritual Encounters  Spiritual Needs  (Unknown)  Stress Factors  Family Stress Factors Major life changes;Health changes

## 2019-04-05 NOTE — Progress Notes (Signed)
Speech Language Pathology Treatment: Dysphagia  Patient Details Name: Miranda Ryan MRN: [id number] DOB: 04-05-1945 Today's Date: 04/05/2019 Time: 1275-1700 SLP Time Calculation (min) (ACUTE ONLY): 25 min  Assessment / Plan / Recommendation Clinical Impression  Pt seen today for ongoing assessment of swallowing; toleration of diet -- Pureed diet w/ thin liquids recommended d/t Baseline Dementia and Edentulous status. This diet consistency would best support nutrition/hydration. Pt appears to present w/ adequate oropharyngeal phase swallow function (Min Oral phase Hesitation w/ increased textured po's noted inconsistently) w/ reduced risk for prandial aspiration when following general aspiration precautions; and given tray setup and support at meals d/t Cognitive decline (at Baseline, pt has a dx of Dementia and Bipolar Dis.). Pt is also Edentulous at baseline - unsure if consistently wears Dentures. Pt requires assistance at meals for setup and eating/drinking encouragement d/t Cognitive decline. NSG supported pt in sitting upright/forward as she fed pt thin liquids, purees. No overt clinical s/s of aspiration noted during/post trials; no decline in vocal quality, respiratory status during/post trials. Oral phase appeared grossly [facility] for bolus management of the trials consumed -- fairly timely bolus manipulation and A-P transfer w/ full oral clearing b/t trials noted. Gentle verbal cues and Time given to allow pt to complete oral clearing. Pt does exhibit intermittent distraction during tasks and had to be redirected to po's.  Recommend a Puree diet consistency d/t Edentulous status and declined Cognitive status/Dementia baseline; Thin liquids via Cup/Straw w/ monitoring. Rec. general aspiration precautions - reduce distractions at meals; tray setup and feeding support at meals. Monitoring of needs at meals d/t baseline Cognitive decline/Dementia. Rec. Dietician f/u for nutritional support as Dementia can  impact pt's overal desire/focus on foods/oral intake. If pt is able to wear her Dentures again, suspect she can return to her baseline diet (soft foods CUT WELL for her for easier chewing) - must ensure secure fit, OR, remain on a Pureed diet for easier intake overall now and at discharge. NSG updated. NSG to reconsult ST services if any needs arise during admission.     HPI HPI: Pt is a 74 y.o. female with a PMH including Bipolar Dis., Dementia, anxiety, Urinary Incontinence w/ UTI, COPD, stroke in 01/2019, Severe mitral valve stenosis brought in by daughter for concern for wound on buttocks, diaphoresis, weakness.  Daughter reports patient had a stroke in December, was cared for at [facility] for period of time recently transitioned home on 03/19/2019 and daughter has been caring for her.  Patient does have a [place] which daughter is been caring for which has been worsening recently.  Reported fever this morning.  Patient is unable to provide any history due to Baseline Dementia.  Patient had been nonambulatory since stroke in Dec. 2020 w/ increased weakness and decreased po intake back in Dec. 2020 prior to that admission per daughter.      SLP Plan  All goals met       Recommendations  Diet recommendations: Dysphagia 1 (puree);Thin liquid(baseline diet consistency recommended) Liquids provided via: Cup;Straw(monitor) Medication Administration: Crushed with puree(for safer swallowing now and at discharge Home) Supervision: Staff to assist with self feeding;Patient able to self feed;Intermittent supervision to cue for compensatory strategies Compensations: Minimize environmental distractions;Slow rate;Small sips/bites;Lingual sweep for clearance of pocketing;Multiple dry swallows after each bite/sip;Follow solids with liquid Postural Changes and/or Swallow Maneuvers: Seated upright 90 degrees;Upright 30-60 min after meal                General recommendations: (Dietician f/u)  Oral  Care Recommendations: Oral care BID;Oral care before and after PO;Staff/trained caregiver to provide oral care Follow up Recommendations: None SLP Visit Diagnosis: Dysphagia, oral phase (R13.11)(baseline Dementia, Edentulous) Plan: All goals met       GO                 Miranda Kenner, MS, CCC-SLP Miranda Ryan 04/05/2019, 1:08 PM

## 2019-04-06 LAB — COMPREHENSIVE METABOLIC PANEL
ALT: 18 U/L (ref 0–44)
AST: 25 U/L (ref 15–41)
Albumin: 1.6 g/dL — ABNORMAL LOW (ref 3.5–5.0)
Alkaline Phosphatase: 78 U/L (ref 38–126)
Anion gap: 9 (ref 5–15)
BUN: 39 mg/dL — ABNORMAL HIGH (ref 8–23)
CO2: 23 mmol/L (ref 22–32)
Calcium: 8 mg/dL — ABNORMAL LOW (ref 8.9–10.3)
Chloride: 109 mmol/L (ref 98–111)
Creatinine, Ser: 1 mg/dL (ref 0.44–1.00)
GFR calc Af Amer: >60 mL/min (ref >60)
GFR calc non Af Amer: 55 mL/min — ABNORMAL LOW (ref >60)
Glucose, Bld: 168 mg/dL — ABNORMAL HIGH (ref 70–99)
Potassium: 3.8 mmol/L (ref 3.5–5.1)
Sodium: 141 mmol/L (ref 135–145)
Total Bilirubin: 0.4 mg/dL (ref 0.3–1.2)
Total Protein: 6 g/dL — ABNORMAL LOW (ref 6.5–8.1)

## 2019-04-06 LAB — GLUCOSE, CAPILLARY
Glucose-Capillary: 178 mg/dL — ABNORMAL HIGH (ref 70–99)
Glucose-Capillary: 136 mg/dL — ABNORMAL HIGH (ref 70–99)

## 2019-04-06 LAB — CBC
HCT: 28 % — ABNORMAL LOW (ref 36.0–46.0)
Hemoglobin: 8.2 g/dL — ABNORMAL LOW (ref 12.0–15.0)
MCH: 25.6 pg — ABNORMAL LOW (ref 26.0–34.0)
MCHC: 29.3 g/dL — ABNORMAL LOW (ref 30.0–36.0)
MCV: 87.5 fL (ref 80.0–100.0)
Platelets: 277 10*3/uL (ref 150–400)
RBC: 3.2 MIL/uL — ABNORMAL LOW (ref 3.87–5.11)
RDW: 17.8 % — ABNORMAL HIGH (ref 11.5–15.5)
WBC: 13.2 10*3/uL — ABNORMAL HIGH (ref 4.0–10.5)
nRBC: 0 % (ref 0.0–0.2)

## 2019-04-06 LAB — CULTURE, BLOOD (ROUTINE X 2)
Special Requests: ADEQUATE
Special Requests: ADEQUATE

## 2019-04-06 LAB — MAGNESIUM: Magnesium: 2.6 mg/dL — ABNORMAL HIGH (ref 1.7–2.4)

## 2019-04-06 MED ORDER — HYDROCORTISONE NA SUCCINATE PF 100 MG IJ SOLR
50.0000 mg | Freq: Two times a day (BID) | INTRAMUSCULAR | Status: DC
  Administered 2019-04-06 – 2019-04-11 (×10): 50 mg via INTRAVENOUS
  Filled 2019-04-06 (×11): qty 1

## 2019-04-06 MED ORDER — HALOPERIDOL LACTATE 2 MG/ML PO CONC
0.5000 mg | ORAL | Status: DC | PRN
  Filled 2019-04-06: qty 0.3

## 2019-04-06 MED ORDER — COLLAGENASE 250 UNIT/GM EX OINT
TOPICAL_OINTMENT | Freq: Two times a day (BID) | CUTANEOUS | Status: DC
  Administered 2019-04-11 – 2019-04-12 (×2): 1 via TOPICAL
  Filled 2019-04-06: qty 30

## 2019-04-06 MED ORDER — ONDANSETRON HCL 4 MG/2ML IJ SOLN: 4.0000 mg | Freq: Four times a day (QID) | INTRAMUSCULAR | Status: DC | PRN

## 2019-04-06 MED ORDER — POLYVINYL ALCOHOL 1.4 % OP SOLN
1.0000 [drp] | Freq: Four times a day (QID) | OPHTHALMIC | Status: DC | PRN
  Filled 2019-04-06: qty 15

## 2019-04-06 MED ORDER — HALOPERIDOL 0.5 MG PO TABS
0.5000 mg | ORAL_TABLET | ORAL | Status: DC | PRN
  Filled 2019-04-06: qty 1

## 2019-04-06 MED ORDER — LORAZEPAM 2 MG/ML IJ SOLN
0.5000 mg | INTRAMUSCULAR | Status: DC | PRN
  Administered 2019-04-06: 2 mg via INTRAVENOUS
  Filled 2019-04-06: qty 1

## 2019-04-06 MED ORDER — ACETAMINOPHEN 325 MG PO TABS: 650.0000 mg | ORAL_TABLET | Freq: Four times a day (QID) | ORAL | Status: DC | PRN

## 2019-04-06 MED ORDER — GLYCOPYRROLATE 1 MG PO TABS
1.0000 mg | ORAL_TABLET | ORAL | Status: DC | PRN
  Filled 2019-04-06: qty 1

## 2019-04-06 MED ORDER — ACETAMINOPHEN 650 MG RE SUPP: 650.0000 mg | Freq: Four times a day (QID) | RECTAL | Status: DC | PRN

## 2019-04-06 MED ORDER — BIOTENE DRY MOUTH MT LIQD: 15.0000 mL | OROMUCOSAL | Status: DC | PRN

## 2019-04-06 MED ORDER — ONDANSETRON 4 MG PO TBDP
4.0000 mg | ORAL_TABLET | Freq: Four times a day (QID) | ORAL | Status: DC | PRN
  Filled 2019-04-06: qty 1

## 2019-04-06 MED ORDER — GLYCOPYRROLATE 0.2 MG/ML IJ SOLN
0.2000 mg | INTRAMUSCULAR | Status: DC | PRN
  Filled 2019-04-06: qty 1

## 2019-04-06 MED ORDER — HALOPERIDOL LACTATE 5 MG/ML IJ SOLN: 0.5000 mg | INTRAMUSCULAR | Status: DC | PRN

## 2019-04-06 MED ORDER — MORPHINE SULFATE (PF) 2 MG/ML IV SOLN
1.0000 mg | INTRAVENOUS | Status: DC | PRN
  Administered 2019-04-07: 1 mg via INTRAVENOUS
  Filled 2019-04-06: qty 1

## 2019-04-06 NOTE — Progress Notes (Signed)
PROGRESS NOTE    Miranda Ryan  [id number] DOB: July 26, 1945 DOA: 04/03/2019 PCP: [place]      Assessment & Plan:   Active Problems:   Septic shock (HCC)   Pressure injury of skin   Bacteremia   Goals of care, counseling/discussion   Palliative care by specialist   Encounter for hospice care discussion   Malnutrition of moderate degree  Septic shock: etiology unclear possibly secondary to bacteremia & decubitus ulcer. Abxs stopped as pt's daughter decided to proceed w/ comfort care only .   Bacteremia: 2 of 2 growing enterococcus. Comfort care only   Sacral decubitus ulcer stage II/III: present on admission. Comfort care only   Leukocytosis: likely secondary to infection.   AKI on CKDIIIb: possibly secondary to ATN from shock.  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  Hypokalemia: WNL today   Moderate malnutrition: continue w/ supplements as tolerated   Metabolic acidosis: resolved  Lactic acidosis: resolved  Hx  recent acute ischemic stroke: with associated UE weakness/dysphagia.  Was treated on last discharge with aspirin and Plavix with finishing Plavix on 1/22 per discharge summary.   DM2: comfort care only   Dementia: continue w/ supportive care  Bipolar disorder: unknown type or severity. Will continue on home dose of buspirone     DVT prophylaxis: comfort care only  Code Status: DNR Family Communication:  Disposition Plan: pt's daughter, [name], decided to proceed w/ full comfort call so all abxs will be stopped   Consultants:   ID  General surgery   ICU  Palliative care   Procedures:    Antimicrobials: abx d/c   Subjective: Pt c/o malaise   Objective: Vitals:   04/06/19 0300 04/06/19 0400 04/06/19 0500 04/06/19 0600  BP: 123/82 108/81 111/86 (!) 128/97  Pulse: 80 66 78 84  Resp: 13 13 17 13   Temp:  97.8 F (36.6 C)    TempSrc:  Oral    SpO2: 98% 98% 98% 96%  Weight:      Height:        Intake/Output  Summary (Last 24 hours) at 04/06/2019 0847 Last data filed at 04/06/2019 0600 Gross per 24 hour  Intake 2829.27 ml  Output 925 ml  Net 1904.27 ml   Filed Weights   04/03/19 1256 04/03/19 1630  Weight: 61.2 kg 51.7 kg    Examination:  General exam: Appears calm and comfortable  Respiratory system: decreased breath sounds b/l. No rhonchi, wheezes  Cardiovascular system: S1 & S2 +. No  rubs, gallops or clicks.  Gastrointestinal system: Abdomen is nondistended, soft and nontender. Hypoactive bowel sounds heard. Central nervous system: Awake, oriented to person, place. Moves all 4 extremities Psychiatry: Judgement and insight appear abnormal. Flat mood and affect      Data Reviewed: I have personally reviewed following labs and imaging studies  CBC: Recent Labs  Lab 04/03/19 1302 04/03/19 1657 04/04/19 0549 04/05/19 0905 04/06/19 0446  WBC 22.9* 20.6* 19.2* 15.3* 13.2*  NEUTROABS 20.9*  --   --   --   --   HGB 10.0* 9.3* 9.1* 8.0* 8.2*  HCT 33.1* 31.1* 29.4* 25.6* 28.0*  MCV 83.8 85.7 83.1 82.1 87.5  PLT 407* 344 326 288 [id number]   Basic Metabolic Panel: Recent Labs  Lab 04/03/19 1302 04/04/19 0549 04/05/19 0905 04/05/19 1758 04/06/19 0446  NA 135 133* 138  --  141  K 4.3 4.3 2.5* 3.1* 3.8  CL 96* 97* 103  --  109  CO2 21* 22  25  --  23  GLUCOSE 243* 151* 146*  --  168*  BUN 58* 55* 44*  --  39*  CREATININE 1.67* 1.33* 1.06*  --  1.00  CALCIUM 8.8* 8.3* 8.1*  --  8.0*  MG  --   --  1.6*  --  2.6*  PHOS  --   --  3.1  --   --    GFR: Estimated Creatinine Clearance: 40.3 mL/min (by C-G formula based on SCr of 1 mg/dL). Liver Function Tests: Recent Labs  Lab 04/03/19 1302 04/05/19 0905 04/06/19 0446  AST 38 22 25  ALT 18 17 18   ALKPHOS 113 77 78  BILITOT 0.8 0.4 0.4  PROT 7.3 6.0* 6.0*  ALBUMIN 1.9* 1.7* 1.6*   No results for input(s): LIPASE, AMYLASE in the last 168 hours. No results for input(s): AMMONIA in the last 168 hours. Coagulation  Profile: Recent Labs  Lab 04/03/19 1302 04/04/19 0549  INR 1.0 1.0   Cardiac Enzymes: No results for input(s): CKTOTAL, CKMB, CKMBINDEX, TROPONINI in the last 168 hours. BNP (last 3 results) No results for input(s): PROBNP in the last 8760 hours. HbA1C: No results for input(s): HGBA1C in the last 72 hours. CBG: Recent Labs  Lab 04/05/19 1550 04/05/19 1930 04/05/19 2349 04/06/19 0414 04/06/19 0743  GLUCAP 190* 246* 287* 178* 136*   Lipid Profile: No results for input(s): CHOL, HDL, LDLCALC, TRIG, CHOLHDL, LDLDIRECT in the last 72 hours. Thyroid Function Tests: No results for input(s): TSH, T4TOTAL, FREET4, T3FREE, THYROIDAB in the last 72 hours. Anemia Panel: No results for input(s): VITAMINB12, FOLATE, FERRITIN, TIBC, IRON, RETICCTPCT in the last 72 hours. Sepsis Labs: Recent Labs  Lab 04/03/19 1302 04/03/19 1302 04/03/19 1657 04/04/19 0549 04/04/19 0942 04/04/19 1234 04/05/19 0905  PROCALCITON 3.32  --   --  2.40  --   --   --   LATICACIDVEN 5.8*   < > 5.9*  --  1.9 2.0* 1.4   < > = values in this interval not displayed.    Recent Results (from the past 240 hour(s))  Blood Culture (routine x 2)     Status: Abnormal   Collection Time: 04/03/19  1:02 PM   Specimen: BLOOD  Result Value Ref Range Status   Specimen Description   Final    BLOOD RIGHT ANTECUBITAL Performed at [facility], [address], [place], [place] [address]    Special Requests   Final    BOTTLES DRAWN AEROBIC AND ANAEROBIC Blood Culture adequate volume Performed at [facility], [address] [place], [place] [address]    Culture  Setup Time   Final    IN BOTH AEROBIC AND ANAEROBIC BOTTLES GRAM POSITIVE COCCI CRITICAL RESULT CALLED TO, READ BACK BY AND VERIFIED WITH: DAVID BESANTI ON 04/04/19 AT 0320 [facility]    Culture ENTEROCOCCUS FAECALIS (A)  Final   Report Status 04/06/2019 FINAL  Final   Organism ID, Bacteria ENTEROCOCCUS FAECALIS  Final      Susceptibility    Enterococcus faecalis - MIC*    AMPICILLIN <=2 SENSITIVE Sensitive     VANCOMYCIN 1 SENSITIVE Sensitive     GENTAMICIN SYNERGY SENSITIVE Sensitive     * ENTEROCOCCUS FAECALIS  Blood Culture ID Panel (Reflexed)     Status: Abnormal   Collection Time: 04/03/19  1:02 PM  Result Value Ref Range Status   Enterococcus species DETECTED (A) NOT DETECTED Final    Comment: CRITICAL RESULT CALLED TO, READ BACK BY AND  VERIFIED WITH:  Nancy Fetter AT [id number] 04/04/19 SNG/SDR    Vancomycin resistance NOT DETECTED NOT DETECTED Final   Listeria monocytogenes NOT DETECTED NOT DETECTED Final   Staphylococcus species NOT DETECTED NOT DETECTED Final   Staphylococcus aureus (BCID) NOT DETECTED NOT DETECTED Final   Streptococcus species NOT DETECTED NOT DETECTED Final   Streptococcus agalactiae NOT DETECTED NOT DETECTED Final   Streptococcus pneumoniae NOT DETECTED NOT DETECTED Final   Streptococcus pyogenes NOT DETECTED NOT DETECTED Final   Acinetobacter baumannii NOT DETECTED NOT DETECTED Final   Enterobacteriaceae species NOT DETECTED NOT DETECTED Final   Enterobacter cloacae complex NOT DETECTED NOT DETECTED Final   Escherichia coli NOT DETECTED NOT DETECTED Final   Klebsiella oxytoca NOT DETECTED NOT DETECTED Final   Klebsiella pneumoniae NOT DETECTED NOT DETECTED Final   Proteus species NOT DETECTED NOT DETECTED Final   Serratia marcescens NOT DETECTED NOT DETECTED Final   Haemophilus influenzae NOT DETECTED NOT DETECTED Final   Neisseria meningitidis NOT DETECTED NOT DETECTED Final   Pseudomonas aeruginosa NOT DETECTED NOT DETECTED Final   Candida albicans NOT DETECTED NOT DETECTED Final   Candida glabrata NOT DETECTED NOT DETECTED Final   Candida krusei NOT DETECTED NOT DETECTED Final   Candida parapsilosis NOT DETECTED NOT DETECTED Final   Candida tropicalis NOT DETECTED NOT DETECTED Final    Comment: Performed at [facility], [address] [place], [place] [address]  Blood Culture  (routine x 2)     Status: Abnormal   Collection Time: 04/03/19  1:08 PM   Specimen: BLOOD  Result Value Ref Range Status   Specimen Description   Final    BLOOD LEFT ANTECUBITAL Performed at [facility], [address], [place], [place] [address]    Special Requests   Final    BOTTLES DRAWN AEROBIC AND ANAEROBIC Blood Culture adequate volume Performed at [facility], [address] [place], [place] [address]    Culture  Setup Time   Final    GRAM POSITIVE COCCI AEROBIC BOTTLE ONLY CRITICAL VALUE NOTED.  VALUE IS CONSISTENT WITH PREVIOUSLY REPORTED AND CALLED VALUE. Performed at [facility], [address], [place], [place] [address]    Culture (A)  Final    ENTEROCOCCUS FAECALIS SUSCEPTIBILITIES PERFORMED ON PREVIOUS CULTURE WITHIN THE LAST 5 DAYS. Performed at [facility], [place] [address] [place], [place] [address]    Report Status 04/06/2019 FINAL  Final  Respiratory Panel by RT PCR (Flu A&B, Covid) - Nasopharyngeal Swab     Status: None   Collection Time: 04/03/19  1:08 PM   Specimen: Nasopharyngeal Swab  Result Value Ref Range Status   SARS Coronavirus 2 by RT PCR NEGATIVE NEGATIVE Final    Comment: (NOTE) SARS-CoV-2 target nucleic acids are NOT DETECTED. The SARS-CoV-2 RNA is generally detectable in upper respiratoy specimens during the acute phase of infection. The lowest concentration of SARS-CoV-2 viral copies this assay can detect is 131 copies/mL. A negative result does not preclude SARS-Cov-2 infection and should not be used as the sole basis for treatment or other patient management decisions. A negative result may occur with  improper specimen collection/handling, submission of specimen other than nasopharyngeal swab, presence of viral mutation(s) within the areas targeted by this assay, and inadequate number of viral copies (<131 copies/mL). A negative result must be combined with clinical observations, patient history,  and epidemiological information. The expected result is Negative. Fact Sheet for Patients:  PinkCheek.be Fact Sheet for Healthcare  Providers:  GravelBags.it This test is not yet ap proved or cleared by the [place] and  has been authorized for detection and/or diagnosis of SARS-CoV-2 by FDA under an Emergency Use Authorization (EUA). This EUA will remain  in effect (meaning this test can be used) for the duration of the COVID-19 declaration under Section 564(b)(1) of the Act, 21 U.S.C. section 360bbb-3(b)(1), unless the authorization is terminated or revoked sooner.    Influenza A by PCR NEGATIVE NEGATIVE Final   Influenza B by PCR NEGATIVE NEGATIVE Final    Comment: (NOTE) The Xpert Xpress SARS-CoV-2/FLU/RSV assay is intended as an aid in  the diagnosis of influenza from Nasopharyngeal swab specimens and  should not be used as a sole basis for treatment. Nasal washings and  aspirates are unacceptable for Xpert Xpress SARS-CoV-2/FLU/RSV  testing. Fact Sheet for Patients: PinkCheek.be Fact Sheet for Healthcare Providers: GravelBags.it This test is not yet approved or cleared by the [place] FDA and  has been authorized for detection and/or diagnosis of SARS-CoV-2 by  FDA under an Emergency Use Authorization (EUA). This EUA will remain  in effect (meaning this test can be used) for the duration of the  Covid-19 declaration under Section 564(b)(1) of the Act, 21  U.S.C. section 360bbb-3(b)(1), unless the authorization is  terminated or revoked. Performed at [facility], [address] [place], [place] [address]   MRSA PCR Screening     Status: None   Collection Time: 04/03/19  4:49 PM   Specimen: Nasal Mucosa; Nasopharyngeal  Result Value Ref Range Status   MRSA by PCR NEGATIVE NEGATIVE Final    Comment:        The GeneXpert MRSA Assay  (FDA approved for NASAL specimens only), is one component of a comprehensive MRSA colonization surveillance program. It is not intended to diagnose MRSA infection nor to guide or monitor treatment for MRSA infections. Performed at [facility], [address] [place], [place] [address]   Urine culture     Status: None   Collection Time: 04/04/19  7:57 AM   Specimen: In/Out Cath Urine  Result Value Ref Range Status   Specimen Description   Final    IN/OUT CATH URINE Performed at [facility], [address], [place], [place] [address]    Special Requests   Final    NONE Performed at [facility], [address], [place], [place] [address]    Culture   Final    NO GROWTH Performed at [facility], [place] [address], [place], [place] [address]    Report Status 04/05/2019 FINAL  Final         Radiology Studies: ECHOCARDIOGRAM COMPLETE  Result Date: 04/05/2019    ECHOCARDIOGRAM REPORT   Patient Name:   Miranda Ryan Date of Exam: 04/05/2019 Medical Rec #:  [id number]      Height:       64.0 in Accession #:    [id number]     Weight:       114.0 lb Date of Birth:  [birth year]       BSA:          1.54 m Patient Age:    8 years       BP:           79/54 mmHg Patient Gender: F              HR:  96 bpm. Exam Location:  [facility] Procedure: 2D Echo, Color Doppler and Cardiac Doppler Indications:     R78.81 Bacteremia  History:         Patient has prior history of Echocardiogram examinations, most                  recent 02/22/2019. COPD and Stroke; Risk Factors:Diabetes.  Sonographer:     Charmayne Sheer RDCS (AE) Referring Phys:  [id number] Tsosie Billing Diagnosing Phys: Ida Rogue MD  Sonographer Comments: Image acquisition challenging due to patient body habitus. IMPRESSIONS  1. Left ventricular ejection fraction, by estimation, is 55 to 60%. The left ventricle has normal function. The left ventrical has no regional wall motion abnormalities. There  is mildly increased left ventricular hypertrophy. Left ventricular diastolic parameters are consistent with Grade I diastolic dysfunction (impaired relaxation).  2. Right ventricular systolic function is normal. The right ventricular size is normal.  3. The mitral valve was not well visualized. no evidence of mitral valve regurgitation. No evidence of mitral stenosis.  4. The aortic valve was not well visualized. Aortic valve regurgitation is not visualized. No aortic stenosis is present.  5. The inferior vena cava is normal in size with greater than 50% respiratory variability, suggesting right atrial pressure of 3 mmHg.  6. Valves not well visualized. Challenging image study. FINDINGS  Left Ventricle: Left ventricular ejection fraction, by estimation, is 55 to 60%. The left ventricle has normal function. The left ventricle has no regional wall motion abnormalities. The left ventricular internal cavity size was normal in size. There is  mildly increased left ventricular hypertrophy. Left ventricular diastolic parameters are consistent with Grade I diastolic dysfunction (impaired relaxation). Right Ventricle: The right ventricular size is normal. No increase in right ventricular wall thickness. Right ventricular systolic function is normal. Left Atrium: Left atrial size was normal in size. Right Atrium: Right atrial size was normal in size. Pericardium: There is no evidence of pericardial effusion. Mitral Valve: The mitral valve was not well visualized. Normal mobility of the mitral valve leaflets. Moderate mitral annular calcification. No evidence of mitral valve regurgitation. No evidence of mitral valve stenosis. MV peak gradient, 33.2 mmHg. The  mean mitral valve gradient is 10.0 mmHg. Tricuspid Valve: The tricuspid valve is normal in structure. Tricuspid valve regurgitation is not demonstrated. No evidence of tricuspid stenosis. Aortic Valve: The aortic valve was not well visualized. Aortic valve regurgitation  is not visualized. No aortic stenosis is present. Aortic valve mean gradient measures 8.0 mmHg. Aortic valve peak gradient measures 13.0 mmHg. Aortic valve area, by VTI measures 2.47 cm. Pulmonic Valve: The pulmonic valve was normal in structure. Pulmonic valve regurgitation is not visualized. No evidence of pulmonic stenosis. Aorta: The aortic root is normal in size and structure. Venous: The inferior vena cava is normal in size with greater than 50% respiratory variability, suggesting right atrial pressure of 3 mmHg. The inferior vena cava and the hepatic vein show a normal flow pattern. IAS/Shunts: No atrial level shunt detected by color flow Doppler.  LEFT VENTRICLE PLAX 2D LVIDd:         3.47 cm  Diastology LVIDs:         2.24 cm  LV e' lateral:   8.70 cm/s LV PW:         0.99 cm  LV E/e' lateral: 14.5 LV IVS:        1.01 cm  LV e' medial:    5.11 cm/s LVOT diam:  1.80 cm  LV E/e' medial:  24.7 LV SV:         72.78 ml LV SV Index:   21.57 LVOT Area:     2.54 cm  LEFT ATRIUM           Index       RIGHT ATRIUM           Index LA diam:      2.70 cm 1.75 cm/m  RA Area:     10.50 cm LA Vol (A4C): 29.9 ml 19.38 ml/m RA Volume:   21.30 ml  13.83 ml/m  AORTIC VALVE                    PULMONIC VALVE AV Area (Vmax):    2.32 cm     PV Vmax:       1.55 m/s AV Area (Vmean):   2.21 cm     PV Vmean:      109.000 cm/s AV Area (VTI):     2.47 cm     PV VTI:        0.288 m AV Vmax:           180.00 cm/s  PV Peak grad:  9.6 mmHg AV Vmean:          129.000 cm/s PV Mean grad:  6.0 mmHg AV VTI:            0.295 m AV Peak Grad:      13.0 mmHg AV Mean Grad:      8.0 mmHg LVOT Vmax:         164.00 cm/s LVOT Vmean:        112.000 cm/s LVOT VTI:          0.286 m LVOT/AV VTI ratio: 0.97  AORTA Ao Root diam: 2.80 cm MITRAL VALVE MV Area (PHT): 2.99 cm              SHUNTS MV Peak grad:  33.2 mmHg             Systemic VTI:  0.29 m MV Mean grad:  10.0 mmHg             Systemic Diam: 1.80 cm MV Vmax:       2.88 m/s MV Vmean:       139.0 cm/s MV Decel Time: 254 msec MV E velocity: 126.00 cm/s 103 cm/s MV A velocity: 200.00 cm/s 70.3 cm/s MV E/A ratio:  0.63        1.5 Ida Rogue MD Electronically signed by Ida Rogue MD Signature Date/Time: 04/05/2019/6:34:24 PM    Final         Scheduled Meds: . aspirin  81 mg Oral Daily  . busPIRone  10 mg Oral BID  . chlorhexidine  15 mL Mouth Rinse BID  . Chlorhexidine Gluconate Cloth  6 each Topical Daily  . collagenase   Topical BID  . feeding supplement (ENSURE ENLIVE)  237 mL Oral BID BM  . heparin  5,000 Units Subcutaneous Q8H  . hydrocortisone sod succinate (SOLU-CORTEF) inj  50 mg Intravenous Q6H  . insulin aspart  0-9 Units Subcutaneous Q4H  . lidocaine-EPINEPHrine  20 mL Intradermal Once  . mouth rinse  15 mL Mouth Rinse q12n4p  . multivitamin with minerals  1 tablet Oral Daily  . sodium chloride flush  3 mL Intravenous Q12H   Continuous Infusions: . sodium chloride 5 mL/hr at 04/06/19 0600  . ampicillin-sulbactam (UNASYN) IV Stopped (04/06/19 0236)  .  lactated ringers with kcl 75 mL/hr at 04/06/19 0600     LOS: 3 days    Time spent: 30 mins     Wyvonnia Dusky, MD [facility] Pager [phone]  If 7PM-7AM, please contact night-coverage [email or web address] 04/06/2019, 8:47 AM

## 2019-04-06 NOTE — Progress Notes (Signed)
Nutrition Brief Follow-Up Note  Chart reviewed. Patient now transitioning to comfort care.   No further nutrition interventions warranted at this time. Please re-consult RD as needed.   Mehgan Santmyer King, MS, RD, LDN Pager number available on Amion   

## 2019-04-06 NOTE — Progress Notes (Signed)
Palliative: Miranda Ryan is resting quietly in bed.  She appears acutely/chronically ill and quite frail.  She will briefly make but not keep eye contact as I talk with her.  She is able to make her basic needs known.  There is no family at bedside at this time.  I asked Miranda Ryan, "are you done with all this?".  She makes and keeps eye contact as she looks at me and states "done".  I share that we will continuing to care for her regardless of what happens.  We talked about hospice type care, and she agrees.  Ask if she would like something to eat or drink, and she states yes.  I ask if she would like ice cream, what flavor, she states "chocolate".  She is able to eat a full cup of ice cream without signs and symptoms of choking.  Conference with CCM attending and bedside nursing staff related to patient condition, needs, goals of care discussion.  Call to daughter/healthcare surrogate, Miranda Ryan.  We talked about my conversation with Miranda Ryan, her sharing that she is done.  Miranda Ryan is tearful stating that her mother has had this for quite some time.  Miranda Ryan tells a story of much family loss recently, and also of contacting her aunt who shares a story of how her uncle died with bacteremia.    We talked about hospice care, at home versus facility.  At this point although Miranda Ryan would like to have her mother at home she feels like residential hospice, [place] facility, would be best.  Miranda Ryan is agreeable to full comfort care, we talked about unburdening Miranda Ryan for medications and treatments that are changing what is happening, no further lab draws, antibiotics, medicines that are changing what is happening.  Conference with medical team related to [place] [facility] [place].  Orders adjusted.   Plan:    Full comfort care.  Residential hospice with [facility]. Prognosis: 2 weeks or less based on bacteremia with patient and family decision for comfort care only, let nature take its  course.  Qualified with albumin of 1.6, decreasing functional status.   55 minutes, extended time  Quinn Axe, NP Palliative Medicine Team Team Phone # [phone] Greater than 50% of this time was spent counseling and coordinating care related to the above assessment and plan.

## 2019-04-06 NOTE — Progress Notes (Signed)
New referral for [facility] hospice home received from [facility].  Patient information given to referral. Approval has been received from hospice medical director.  [facility] is unable to offer a bed today. Patient post transfer to rm 220. Patient denied pain, appeared some what anxious. Discussed with staff RN Misty, she will continue to assess. No family at bedside. Flo Shanks BSN, RN, [facility] [employer] [phone]

## 2019-04-06 NOTE — Progress Notes (Signed)
[facility] SURGICAL PROGRESS NOTE (cpt [phone])  Hospital Day(s): 3.    Interval History: Patient seen and examined, no acute events or new complaints overnight. Unable to obtain history secondary to dementia and non-verbal status.  Leukocytosis improved to 13K; afebrile Hypokalemia resolved Renal function improved; UO - 1.1 L   Review of Systems:  Unable to obtain ROS secondary to dementia and non-verbal status   Vital signs in last 24 hours: [min-max] current  Temp:  [97.7 F (36.5 C)-97.9 F (36.6 C)] 97.8 F (36.6 C) (02/12 0400) Pulse Rate:  [57-140] 84 (02/12 0600) Resp:  [13-25] 13 (02/12 0600) BP: (79-133)/(51-100) 128/97 (02/12 0600) SpO2:  [95 %-100 %] 96 % (02/12 0600)     Height: 5\' 4"  (162.6 cm) Weight: 51.7 kg BMI (Calculated): 19.55   Intake/Output last 2 shifts:  02/11 0701 - 02/12 0700 In: 3179.3 [P.O.:350; I.V.:1735.1; IV Piggyback:1094.1] Out: 1175 [Urine:1175]   Physical Exam:  Constitutional: alert, no distress, resting comfortably in bed, more vocal this morning HENT: normocephalic without obvious abnormality  Eyes: PERRL, EOM's grossly intact and symmetric  Respiratory: breathing non-labored at rest  Cardiovascular: regular rate and sinus rhythm  Integumentary: Stage II relatively superficial ulceration of the sacrum, there is no purulent, fluctuance, or necrotic tissue appreciable.   Sacral Wound (04/06/2019):       Labs:  CBC Latest Ref Rng & Units 04/06/2019 04/05/2019 04/04/2019  WBC 4.0 - 10.5 K/uL 13.2(H) 15.3(H) 19.2(H)  Hemoglobin 12.0 - 15.0 g/dL 8.2(L) 8.0(L) 9.1(L)  Hematocrit 36.0 - 46.0 % 28.0(L) 25.6(L) 29.4(L)  Platelets 150 - 400 K/uL 277 288 326   CMP Latest Ref Rng & Units 04/06/2019 04/05/2019 04/05/2019  Glucose 70 - 99 mg/dL 168(H) - 146(H)  BUN 8 - 23 mg/dL 39(H) - 44(H)  Creatinine 0.44 - 1.00 mg/dL 1.00 - 1.06(H)  Sodium 135 - 145 mmol/L 141 - 138  Potassium 3.5 - 5.1 mmol/L 3.8 3.1(L) 2.5(LL)  Chloride  98 - 111 mmol/L 109 - 103  CO2 22 - 32 mmol/L 23 - 25  Calcium 8.9 - 10.3 mg/dL 8.0(L) - 8.1(L)  Total Protein 6.5 - 8.1 g/dL 6.0(L) - 6.0(L)  Total Bilirubin 0.3 - 1.2 mg/dL 0.4 - 0.4  Alkaline Phos 38 - 126 U/L 78 - 77  AST 15 - 41 U/L 25 - 22  ALT 0 - 44 U/L 18 - 17    Imaging studies: No new pertinent imaging studies  Assessment/Plan: (ICD-10's: L89.152) 74 y.o. female with stage II sacral decubitus ulceration s/p sharp excisional debridement of eschar on [id number], complicated by dementia, recent CVA, and multiple comorbid conditions   - Continue local wound care; start collagenase BID; frequent repositioning  - No further surgical intervention; nothing further to debride from sacral wound  - Continue Abx per primary  - Palliative following  - Further management per primary service  All of the above findings and recommendations were discussed with the medical team.  -- Edison Simon, PA-C [facility] 04/06/2019, 7:31 AM [phone] M-F: 7am - 4pm

## 2019-04-06 NOTE — Progress Notes (Signed)
CRITICAL CARE PROGRESS NOTE    Name: Miranda Ryan MRN: [id number] DOB: 09-Mar-1945     LOS: 3   SUBJECTIVE FINDINGS & SIGNIFICANT EVENTS   Patient description:  This is a pleasant octogenarian she has a complicated history including moderate protein calorie malnutrition with a background history of COPD dementia and chronic dysphagia, has been at home with most of the care provided by her daughter, she had a history of CVA in the past and has significant cardiac medical history including mitral valve stenosis which is severe, chronic urinary incontinence, diabetes anxiety and depression on top of her dementia, she also has thyroid disease.  She was diagnosed with enterococcal bacteremia 2 days ago on April 03, 2019.  She has a significant stage II sacral decubitus ulcer which was evaluated by surgical team and had sharp excisional debridement of eschar with no residual infection or necrosis remaining.  She was initially placed on stepdown service in the medical intensive care unit with hospitalist primary however patient was noted to have significant hypotension today with SBP is less than 70 and critical care consultation was placed for additional evaluation management.   Lines / Drains: PIV x3  Cultures / Sepsis markers: Blood culture positive with Enterococcus  Antibiotics: Vancomycin and cefepime changing to Unasyn as per ID   Protocols / Consultants: Hospitalist, surgery, ID, PCCM  Tests / Events: Transthoracic echo  Overnight: 04/05/19-patient found profoundly hypotensive and tachycardic with SBP 70s and HR >120.  04/06/19 -patient is s/p comfort measures this am.  Full comfort care orders as per PALS  PAST MEDICAL HISTORY   Past Medical History:  Diagnosis Date  . Acute CVA (cerebrovascular  accident) ([place]) 02/22/2019  . Acute exacerbation of chronic obstructive pulmonary disease (COPD) ([place]) 04/20/2016  . Anxiety   . Bipolar disorder ([place])   . Dementia ([place])   . Depression   . Diabetes mellitus, type II ([place])   . Dysphagia 02/23/2019  . Incontinence of urine   . Low serum thyroid stimulating hormone (TSH) 02/23/2019  . Severe mitral valve stenosis 02/23/2019     SURGICAL HISTORY   Past Surgical History:  Procedure Laterality Date  . CERVIX SURGERY       FAMILY HISTORY   Family History  Problem Relation Age of Onset  . Liver cancer Mother   . Heart attack Father   . Heart disease Sister   . Heart disease Brother   . Heart disease Brother   . Heart disease Brother      SOCIAL HISTORY   Social History   Tobacco Use  . Smoking status: Current Every Day Smoker    Packs/day: 1.00    Years: 46.00    Pack years: 46.00    Types: Cigarettes    Start date: 09/16/1964  . Smokeless tobacco: Never Used  Substance Use Topics  . Alcohol use: Not Currently    Alcohol/week: 0.0 standard drinks    Comment: occasionally  . Drug use: No     MEDICATIONS   Current Medication:  Current Facility-Administered Medications:  .  0.9 %  sodium chloride infusion, , Intravenous, PRN, Sharion Settler, NP, Last Rate: 5 mL/hr at 04/06/19 0600, Rate Verify at 04/06/19 0600 .  acetaminophen (TYLENOL) tablet 650 mg, 650 mg, Oral, Q6H PRN **OR** acetaminophen (TYLENOL) suppository 650 mg, 650 mg, Rectal, Q6H PRN, Dove, Tasha A, NP .  antiseptic oral rinse (BIOTENE) solution 15 mL, 15 mL, Topical, PRN, Dove, Tasha A, NP .  busPIRone (BUSPAR)  tablet 10 mg, 10 mg, Oral, BID, Wyvonnia Dusky, MD, 10 mg at 04/06/19 1013 .  chlorhexidine (PERIDEX) 0.12 % solution 15 mL, 15 mL, Mouth Rinse, BID, Wyvonnia Dusky, MD, 15 mL at 04/06/19 1014 .  collagenase (SANTYL) ointment, , Topical, BID, Tylene Fantasia, PA-C, Given at 04/06/19 1013 .  glycopyrrolate (ROBINUL) tablet 1 mg, 1 mg,  Oral, Q4H PRN **OR** glycopyrrolate (ROBINUL) injection 0.2 mg, 0.2 mg, Subcutaneous, Q4H PRN **OR** glycopyrrolate (ROBINUL) injection 0.2 mg, 0.2 mg, Intravenous, Q4H PRN, Dove, Tasha A, NP .  haloperidol (HALDOL) tablet 0.5 mg, 0.5 mg, Oral, Q4H PRN **OR** haloperidol (HALDOL) 2 MG/ML solution 0.5 mg, 0.5 mg, Sublingual, Q4H PRN **OR** haloperidol lactate (HALDOL) injection 0.5 mg, 0.5 mg, Intravenous, Q4H PRN, Dove, Tasha A, NP .  hydrocortisone sodium succinate (SOLU-CORTEF) 100 MG injection 50 mg, 50 mg, Intravenous, BID, Dekayla Prestridge, MD .  LORazepam (ATIVAN) injection 0.5-2 mg, 0.5-2 mg, Intravenous, Q4H PRN, Dove, Tasha A, NP .  morphine 2 MG/ML injection 1-2 mg, 1-2 mg, Intravenous, Q2H PRN, Dove, Tasha A, NP .  ondansetron (ZOFRAN-ODT) disintegrating tablet 4 mg, 4 mg, Oral, Q6H PRN **OR** ondansetron (ZOFRAN) injection 4 mg, 4 mg, Intravenous, Q6H PRN, Dove, Tasha A, NP .  polyvinyl alcohol (LIQUIFILM TEARS) 1.4 % ophthalmic solution 1 drop, 1 drop, Both Eyes, QID PRN, Dove, Tasha A, NP .  silver nitrate applicators applicator 10 Stick, 10 Stick, Topical, PRN, Edison Simon R, PA-C .  sodium chloride flush (NS) 0.9 % injection 3 mL, 3 mL, Intravenous, Q12H, Kurtis Bushman, Sahar, MD, 3 mL at 04/06/19 1015    ALLERGIES   Demerol [meperidine]    REVIEW OF SYSTEMS     10 point ROS unable to conduct due to dementia  PHYSICAL EXAMINATION   Vital Signs: Temp:  [97.7 F (36.5 C)-97.8 F (36.6 C)] 97.8 F (36.6 C) (02/12 0400) Pulse Rate:  [57-99] 95 (02/12 1300) Resp:  [13-25] 21 (02/12 1300) BP: (102-143)/(60-100) 126/79 (02/12 1300) SpO2:  [95 %-100 %] 97 % (02/12 1300)  GENERAL: Chronically ill-appearing in no apparent distress HEAD: Normocephalic, atraumatic.  EYES: Pupils equal, round, reactive to light.  No scleral icterus.  MOUTH: Moist mucosal membrane. NECK: Supple. No thyromegaly. No nodules. No JVD.  PULMONARY: Clear to auscultation bilaterally CARDIOVASCULAR: S1  and S2. Regular rate and rhythm. No murmurs, rubs, or gallops.  GASTROINTESTINAL: Soft, nontender, non-distended. No masses. Positive bowel sounds. No hepatosplenomegaly.  MUSCULOSKELETAL: No swelling, clubbing, or edema.  NEUROLOGIC: Mild distress due to acute illness SKIN: Erythema of heels of both lower extremities, sacral decubitus ulcer stage II status post debridement   PERTINENT DATA     Infusions: . sodium chloride 5 mL/hr at 04/06/19 0600   Scheduled Medications: . busPIRone  10 mg Oral BID  . chlorhexidine  15 mL Mouth Rinse BID  . collagenase   Topical BID  . hydrocortisone sod succinate (SOLU-CORTEF) inj  50 mg Intravenous BID  . sodium chloride flush  3 mL Intravenous Q12H   PRN Medications: sodium chloride, acetaminophen **OR** acetaminophen, antiseptic oral rinse, glycopyrrolate **OR** glycopyrrolate **OR** glycopyrrolate, haloperidol **OR** haloperidol **OR** haloperidol lactate, LORazepam, morphine injection, ondansetron **OR** ondansetron (ZOFRAN) IV, polyvinyl alcohol, silver nitrate applicators Hemodynamic parameters:   Intake/Output: 02/11 0701 - 02/12 0700 In: 3179.3 [P.O.:350; I.V.:1735.1; IV Piggyback:1094.1] Out: [place] [id number]  Ventilator  Settings:      LAB RESULTS:  Basic Metabolic Panel: Recent Labs  Lab 04/03/19 1302 04/03/19 1302 04/04/19 0549 04/04/19 0549 04/05/19 [id number]  04/05/19 0905 04/05/19 1758 04/06/19 0446  NA 135  --  133*  --  138  --   --  141  K 4.3   < > 4.3   < > 2.5*   < > 3.1* 3.8  CL 96*  --  97*  --  103  --   --  109  CO2 21*  --  22  --  25  --   --  23  GLUCOSE 243*  --  151*  --  146*  --   --  168*  BUN 58*  --  55*  --  44*  --   --  39*  CREATININE 1.67*  --  1.33*  --  1.06*  --   --  1.00  CALCIUM 8.8*  --  8.3*  --  8.1*  --   --  8.0*  MG  --   --   --   --  1.6*  --   --  2.6*  PHOS  --   --   --   --  3.1  --   --   --    < > = values in this interval not displayed.   Liver Function  Tests: Recent Labs  Lab 04/03/19 1302 04/05/19 0905 04/06/19 0446  AST 38 22 25  ALT 18 17 18   ALKPHOS 113 77 78  BILITOT 0.8 0.4 0.4  PROT 7.3 6.0* 6.0*  ALBUMIN 1.9* 1.7* 1.6*   No results for input(s): LIPASE, AMYLASE in the last 168 hours. No results for input(s): AMMONIA in the last 168 hours. CBC: Recent Labs  Lab 04/03/19 1302 04/03/19 1657 04/04/19 0549 04/05/19 0905 04/06/19 0446  WBC 22.9* 20.6* 19.2* 15.3* 13.2*  NEUTROABS 20.9*  --   --   --   --   HGB 10.0* 9.3* 9.1* 8.0* 8.2*  HCT 33.1* 31.1* 29.4* 25.6* 28.0*  MCV 83.8 85.7 83.1 82.1 87.5  PLT 407* 344 326 288 277   Cardiac Enzymes: No results for input(s): CKTOTAL, CKMB, CKMBINDEX, TROPONINI in the last 168 hours. BNP: Invalid input(s): POCBNP CBG: Recent Labs  Lab 04/05/19 1550 04/05/19 1930 04/05/19 2349 04/06/19 0414 04/06/19 0743  GLUCAP 190* 246* 287* 178* 136*     IMAGING RESULTS:  Imaging: ECHOCARDIOGRAM COMPLETE  Result Date: 04/05/2019    ECHOCARDIOGRAM REPORT   Patient Name:   Miranda Ryan Date of Exam: 04/05/2019 Medical Rec #:  [id number]      Height:       64.0 in Accession #:    [id number]     Weight:       114.0 lb Date of Birth:  1945/03/11       BSA:          1.54 m Patient Age:    74 years       BP:           79/54 mmHg Patient Gender: F              HR:           96 bpm. Exam Location:  [facility] Procedure: 2D Echo, Color Doppler and Cardiac Doppler Indications:     R78.81 Bacteremia  History:         Patient has prior history of Echocardiogram examinations, most                  recent 02/22/2019. COPD and Stroke; Risk Factors:Diabetes.  Sonographer:  Charmayne Sheer RDCS (AE) Referring Phys:  [id number] Tsosie Billing Diagnosing Phys: Ida Rogue MD  Sonographer Comments: Image acquisition challenging due to patient body habitus. IMPRESSIONS  1. Left ventricular ejection fraction, by estimation, is 55 to 60%. The left ventricle has normal function. The left ventrical has no  regional wall motion abnormalities. There is mildly increased left ventricular hypertrophy. Left ventricular diastolic parameters are consistent with Grade I diastolic dysfunction (impaired relaxation).  2. Right ventricular systolic function is normal. The right ventricular size is normal.  3. The mitral valve was not well visualized. no evidence of mitral valve regurgitation. No evidence of mitral stenosis.  4. The aortic valve was not well visualized. Aortic valve regurgitation is not visualized. No aortic stenosis is present.  5. The inferior vena cava is normal in size with greater than 50% respiratory variability, suggesting right atrial pressure of 3 mmHg.  6. Valves not well visualized. Challenging image study. FINDINGS  Left Ventricle: Left ventricular ejection fraction, by estimation, is 55 to 60%. The left ventricle has normal function. The left ventricle has no regional wall motion abnormalities. The left ventricular internal cavity size was normal in size. There is  mildly increased left ventricular hypertrophy. Left ventricular diastolic parameters are consistent with Grade I diastolic dysfunction (impaired relaxation). Right Ventricle: The right ventricular size is normal. No increase in right ventricular wall thickness. Right ventricular systolic function is normal. Left Atrium: Left atrial size was normal in size. Right Atrium: Right atrial size was normal in size. Pericardium: There is no evidence of pericardial effusion. Mitral Valve: The mitral valve was not well visualized. Normal mobility of the mitral valve leaflets. Moderate mitral annular calcification. No evidence of mitral valve regurgitation. No evidence of mitral valve stenosis. MV peak gradient, 33.2 mmHg. The  mean mitral valve gradient is 10.0 mmHg. Tricuspid Valve: The tricuspid valve is normal in structure. Tricuspid valve regurgitation is not demonstrated. No evidence of tricuspid stenosis. Aortic Valve: The aortic valve was not  well visualized. Aortic valve regurgitation is not visualized. No aortic stenosis is present. Aortic valve mean gradient measures 8.0 mmHg. Aortic valve peak gradient measures 13.0 mmHg. Aortic valve area, by VTI measures 2.47 cm. Pulmonic Valve: The pulmonic valve was normal in structure. Pulmonic valve regurgitation is not visualized. No evidence of pulmonic stenosis. Aorta: The aortic root is normal in size and structure. Venous: The inferior vena cava is normal in size with greater than 50% respiratory variability, suggesting right atrial pressure of 3 mmHg. The inferior vena cava and the hepatic vein show a normal flow pattern. IAS/Shunts: No atrial level shunt detected by color flow Doppler.  LEFT VENTRICLE PLAX 2D LVIDd:         3.47 cm  Diastology LVIDs:         2.24 cm  LV e' lateral:   8.70 cm/s LV PW:         0.99 cm  LV E/e' lateral: 14.5 LV IVS:        1.01 cm  LV e' medial:    5.11 cm/s LVOT diam:     1.80 cm  LV E/e' medial:  24.7 LV SV:         72.78 ml LV SV Index:   21.57 LVOT Area:     2.54 cm  LEFT ATRIUM           Index       RIGHT ATRIUM           Index LA  diam:      2.70 cm 1.75 cm/m  RA Area:     10.50 cm LA Vol (A4C): 29.9 ml 19.38 ml/m RA Volume:   21.30 ml  13.83 ml/m  AORTIC VALVE                    PULMONIC VALVE AV Area (Vmax):    2.32 cm     PV Vmax:       1.55 m/s AV Area (Vmean):   2.21 cm     PV Vmean:      109.000 cm/s AV Area (VTI):     2.47 cm     PV VTI:        0.288 m AV Vmax:           180.00 cm/s  PV Peak grad:  9.6 mmHg AV Vmean:          129.000 cm/s PV Mean grad:  6.0 mmHg AV VTI:            0.295 m AV Peak Grad:      13.0 mmHg AV Mean Grad:      8.0 mmHg LVOT Vmax:         164.00 cm/s LVOT Vmean:        112.000 cm/s LVOT VTI:          0.286 m LVOT/AV VTI ratio: 0.97  AORTA Ao Root diam: 2.80 cm MITRAL VALVE MV Area (PHT): 2.99 cm              SHUNTS MV Peak grad:  33.2 mmHg             Systemic VTI:  0.29 m MV Mean grad:  10.0 mmHg             Systemic Diam:  1.80 cm MV Vmax:       2.88 m/s MV Vmean:      139.0 cm/s MV Decel Time: 254 msec MV E velocity: 126.00 cm/s 103 cm/s MV A velocity: 200.00 cm/s 70.3 cm/s MV E/A ratio:  0.63        1.5 Ida Rogue MD Electronically signed by Ida Rogue MD Signature Date/Time: 04/05/2019/6:34:24 PM    Final      ASSESSMENT AND PLAN    -Multidisciplinary rounds held today    Septic shock  -Due to enterococcal bacteremia with a sacral decubitus ulcer stage II status post debridement  -ID on case-appreciate input switching antibiotics to Unasyn -Close monitoring of hemodynamics -Vasopressors for MAP of less than 65 -SoluCortef 50 q6h ICU monitoring -Palliative care consultation today    Moderate protein calorie malnutrition -RD dietary consultation    Renal Failure acute on chronic  -follow chem 7 -follow UO -continue Foley Catheter-assess need daily -DC nonessential nephrotoxic medications   NEUROLOGY -Advanced dementia as well as depression and anxiety disorder -s/p CVA with bedbound state at baseline  ID -continue IV abx as prescibed -follow up cultures  GI/Nutrition GI PROPHYLAXIS as indicated DIET-->TF's as tolerated Constipation protocol as indicated  ENDO - ICU hypoglycemic\Hyperglycemia protocol -check FSBS per protocol   ELECTROLYTES -follow labs as needed -replace as needed -pharmacy consultation   DVT/GI PRX ordered -SCDs  TRANSFUSIONS AS NEEDED MONITOR FSBS ASSESS the need for LABS as needed   Critical care provider statement:    Critical care time (minutes):  33   Critical care time was exclusive of:  Separately billable procedures and treating other patients   Critical care was necessary to  treat or prevent imminent or life-threatening deterioration of the following conditions:  septic shock, sacral decubitus, bactermia, multiple comorbid conditions   Critical care was time spent personally by me on the following activities:  Development of  treatment plan with patient or surrogate, discussions with consultants, evaluation of patient's response to treatment, examination of patient, obtaining history from patient or surrogate, ordering and performing treatments and interventions, ordering and review of laboratory studies and re-evaluation of patient's condition.  I assumed direction of critical care for this patient from another provider in my specialty: no    This document was prepared using Dragon voice recognition software and may include unintentional dictation errors.    Ottie Glazier, M.D.  Division of [place]

## 2019-04-06 NOTE — Clinical Social Work Note (Signed)
Made hospice house referral to Flo Shanks, RN from [employer] [facility]. No bed available at this time. Will continue to follow.  Dayton Scrape, [place]

## 2019-04-07 DIAGNOSIS — L89152 Pressure ulcer of sacral region, stage 2: Secondary | ICD-10-CM

## 2019-04-07 NOTE — Progress Notes (Signed)
04/07/2019  Subjective: No acute events overnight.  Patient was transferred out of the ICU to a regular room yesterday.  After further discussion with the family, the family has plans for palliative care and transitioning to comfort measures.  Today, the patient denies any pain.  Vital signs: Temp:  [97.7 F (36.5 C)-98.7 F (37.1 C)] 97.7 F (36.5 C) (02/13 0651) Pulse Rate:  [82-102] 82 (02/13 0651) Resp:  [18-28] 18 (02/13 0651) BP: (119-130)/(79-95) 121/89 (02/13 0651) SpO2:  [95 %-100 %] 100 % (02/13 0651)   Intake/Output: 02/12 0701 - 02/13 0700 In: 0  Out: 1900 [Urine:1900] Last BM Date: 04/06/19  Physical Exam: Constitutional: No acute distress Skin: Further evaluation of her sacral decubitus wound shows that the skin still has areas of necrosis.  Labs:  Recent Labs    04/05/19 0905 04/06/19 0446  WBC 15.3* 13.2*  HGB 8.0* 8.2*  HCT 25.6* 28.0*  PLT 288 277   Recent Labs    04/05/19 0905 04/05/19 0905 04/05/19 1758 04/06/19 0446  NA 138  --   --  141  K 2.5*   < > 3.1* 3.8  CL 103  --   --  109  CO2 25  --   --  23  GLUCOSE 146*  --   --  168*  BUN 44*  --   --  39*  CREATININE 1.06*  --   --  1.00  CALCIUM 8.1*  --   --  8.0*   < > = values in this interval not displayed.   No results for input(s): LABPROT, INR in the last 72 hours.  Imaging: No results found.  Assessment/Plan: This is a 74 y.o. female admitted recently with sepsis and status post bedside debridement by Dr. Celine Ahr of a sacral decubitus wound.  -Upon further evaluation of the wound, there is still necrotic edges will benefit from further debridement.  Although the patient is transition to comfort measures as part of her goals of care, I think that leaving this wound as it is, may increase the risk of infection there which I think would cause more harm in trying to keep her comfortable.  Thus I proceeded to do a bedside sharp excisional debridement using a 10 blade scalpel of the  necrotic edges measuring for total of 5 x 3 cm wound.  This was debrided down to healthier tissue which had oozing edges.  Pressure was applied for hemostasis.  Then the wound was dressed with Santyl ointment, Xeroform gauze, and Mepilex foam dressing.  Patient tolerated the procedure well and there were no complications and sharps were appropriately disposed of.   Melvyn Neth, MD [facility]

## 2019-04-07 NOTE — Progress Notes (Signed)
PROGRESS NOTE    Miranda Ryan  [id number] DOB: 09/09/1945 DOA: 04/03/2019 PCP: [place]      Assessment & Plan:   Active Problems:   Septic shock (HCC)   Pressure injury of skin   Bacteremia   Goals of care, counseling/discussion   Palliative care by specialist   Encounter for hospice care discussion   Malnutrition of moderate degree  Septic shock: etiology unclear possibly secondary to bacteremia & decubitus ulcer. Abxs stopped as pt's daughter decided to proceed w/ comfort care only .   Bacteremia: 2 of 2 growing enterococcus. Continue w/ comfort care only   Sacral decubitus ulcer stage II/III: present on admission. Comfort care only   Leukocytosis: likely secondary to infection.   AKI on CKDIIIb: possibly secondary to ATN from shock.  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  Hypokalemia: will not be checking labs anymore   Moderate malnutrition: continue w/ supplements as tolerated   Metabolic acidosis: resolved  Lactic acidosis: resolved  Hx recent acute ischemic stroke: with associated UE weakness/dysphagia.  Was treated on last discharge with aspirin and Plavix with finishing Plavix on 1/22 per discharge summary.   DM2: comfort care only   Dementia: continue w/ supportive care  Bipolar disorder: unknown type or severity. Will continue on home dose of buspirone     DVT prophylaxis: comfort care only  Code Status: DNR Family Communication:  Disposition Plan: pt's daughter, [name], decided to proceed w/ full comfort care only. No beds available. Will continue w/ comfort care    Consultants:   ID  General surgery   ICU  Palliative care   Procedures:    Antimicrobials: abx d/c   Subjective: Pt requests for some sugar. Pt denies any complaints   Objective: Vitals:   04/06/19 1500 04/06/19 1600 04/06/19 1641 04/07/19 0651  BP: (!) 119/95 121/80 130/88 121/89  Pulse:  100 (!) 102 82  Resp: (!) 28 (!) 21 18 18   Temp:    98.7 F (37.1 C) 97.7 F (36.5 C)  TempSrc:   Oral Oral  SpO2:  98% 95% 100%  Weight:      Height:        Intake/Output Summary (Last 24 hours) at 04/07/2019 0729 Last data filed at 04/07/2019 [id number] Gross per 24 hour  Intake 0 ml  Output 1900 ml  Net -1900 ml   Filed Weights   04/03/19 1256 04/03/19 1630  Weight: 61.2 kg 51.7 kg    Examination:  General exam: Appears calm and comfortable  Respiratory system: diminished breath sounds b/l. No rales  Cardiovascular system: S1 & S2 +. No  rubs, gallops or clicks.  Gastrointestinal system: Abdomen is nondistended, soft and nontender. Normal bowel sounds heard. Central nervous system: Awake, oriented to person, place. Moves all 4 extremities Psychiatry: Judgement and insight appear abnormal. Flat mood and affect      Data Reviewed: I have personally reviewed following labs and imaging studies  CBC: Recent Labs  Lab 04/03/19 1302 04/03/19 1657 04/04/19 0549 04/05/19 0905 04/06/19 0446  WBC 22.9* 20.6* 19.2* 15.3* 13.2*  NEUTROABS 20.9*  --   --   --   --   HGB 10.0* 9.3* 9.1* 8.0* 8.2*  HCT 33.1* 31.1* 29.4* 25.6* 28.0*  MCV 83.8 85.7 83.1 82.1 87.5  PLT 407* 344 326 288 [id number]   Basic Metabolic Panel: Recent Labs  Lab 04/03/19 1302 04/04/19 0549 04/05/19 0905 04/05/19 1758 04/06/19 0446  NA 135 133* 138  --  141  K 4.3 4.3 2.5* 3.1* 3.8  CL 96* 97* 103  --  109  CO2 21* 22 25  --  23  GLUCOSE 243* 151* 146*  --  168*  BUN 58* 55* 44*  --  39*  CREATININE 1.67* 1.33* 1.06*  --  1.00  CALCIUM 8.8* 8.3* 8.1*  --  8.0*  MG  --   --  1.6*  --  2.6*  PHOS  --   --  3.1  --   --    GFR: Estimated Creatinine Clearance: 40.3 mL/min (by C-G formula based on SCr of 1 mg/dL). Liver Function Tests: Recent Labs  Lab 04/03/19 1302 04/05/19 0905 04/06/19 0446  AST 38 22 25  ALT 18 17 18   ALKPHOS 113 77 78  BILITOT 0.8 0.4 0.4  PROT 7.3 6.0* 6.0*  ALBUMIN 1.9* 1.7* 1.6*   No results for input(s): LIPASE, AMYLASE  in the last 168 hours. No results for input(s): AMMONIA in the last 168 hours. Coagulation Profile: Recent Labs  Lab 04/03/19 1302 04/04/19 0549  INR 1.0 1.0   Cardiac Enzymes: No results for input(s): CKTOTAL, CKMB, CKMBINDEX, TROPONINI in the last 168 hours. BNP (last 3 results) No results for input(s): PROBNP in the last 8760 hours. HbA1C: No results for input(s): HGBA1C in the last 72 hours. CBG: Recent Labs  Lab 04/05/19 1550 04/05/19 1930 04/05/19 2349 04/06/19 0414 04/06/19 0743  GLUCAP 190* 246* 287* 178* 136*   Lipid Profile: No results for input(s): CHOL, HDL, LDLCALC, TRIG, CHOLHDL, LDLDIRECT in the last 72 hours. Thyroid Function Tests: No results for input(s): TSH, T4TOTAL, FREET4, T3FREE, THYROIDAB in the last 72 hours. Anemia Panel: No results for input(s): VITAMINB12, FOLATE, FERRITIN, TIBC, IRON, RETICCTPCT in the last 72 hours. Sepsis Labs: Recent Labs  Lab 04/03/19 1302 04/03/19 1302 04/03/19 1657 04/04/19 0549 04/04/19 0942 04/04/19 1234 04/05/19 0905  PROCALCITON 3.32  --   --  2.40  --   --   --   LATICACIDVEN 5.8*   < > 5.9*  --  1.9 2.0* 1.4   < > = values in this interval not displayed.    Recent Results (from the past 240 hour(s))  Blood Culture (routine x 2)     Status: Abnormal   Collection Time: 04/03/19  1:02 PM   Specimen: BLOOD  Result Value Ref Range Status   Specimen Description   Final    BLOOD RIGHT ANTECUBITAL Performed at [facility], [address], [place], [place] [address]    Special Requests   Final    BOTTLES DRAWN AEROBIC AND ANAEROBIC Blood Culture adequate volume Performed at [facility], [address] [place], [place] [address]    Culture  Setup Time   Final    IN BOTH AEROBIC AND ANAEROBIC BOTTLES GRAM POSITIVE COCCI CRITICAL RESULT CALLED TO, READ BACK BY AND VERIFIED WITH: DAVID BESANTI ON 04/04/19 AT 0320 [facility]    Culture ENTEROCOCCUS FAECALIS (A)  Final   Report Status 04/06/2019  FINAL  Final   Organism ID, Bacteria ENTEROCOCCUS FAECALIS  Final      Susceptibility   Enterococcus faecalis - MIC*    AMPICILLIN <=2 SENSITIVE Sensitive     VANCOMYCIN 1 SENSITIVE Sensitive     GENTAMICIN SYNERGY SENSITIVE Sensitive     * ENTEROCOCCUS FAECALIS  Blood Culture ID Panel (Reflexed)     Status: Abnormal   Collection Time: 04/03/19  1:02 PM  Result Value Ref Range Status  Enterococcus species DETECTED (A) NOT DETECTED Final    Comment: CRITICAL RESULT CALLED TO, READ BACK BY AND VERIFIED WITH:  SHEEMA HALLAJI AT [id number] 04/04/19 SNG/SDR    Vancomycin resistance NOT DETECTED NOT DETECTED Final   Listeria monocytogenes NOT DETECTED NOT DETECTED Final   Staphylococcus species NOT DETECTED NOT DETECTED Final   Staphylococcus aureus (BCID) NOT DETECTED NOT DETECTED Final   Streptococcus species NOT DETECTED NOT DETECTED Final   Streptococcus agalactiae NOT DETECTED NOT DETECTED Final   Streptococcus pneumoniae NOT DETECTED NOT DETECTED Final   Streptococcus pyogenes NOT DETECTED NOT DETECTED Final   Acinetobacter baumannii NOT DETECTED NOT DETECTED Final   Enterobacteriaceae species NOT DETECTED NOT DETECTED Final   Enterobacter cloacae complex NOT DETECTED NOT DETECTED Final   Escherichia coli NOT DETECTED NOT DETECTED Final   Klebsiella oxytoca NOT DETECTED NOT DETECTED Final   Klebsiella pneumoniae NOT DETECTED NOT DETECTED Final   Proteus species NOT DETECTED NOT DETECTED Final   Serratia marcescens NOT DETECTED NOT DETECTED Final   Haemophilus influenzae NOT DETECTED NOT DETECTED Final   Neisseria meningitidis NOT DETECTED NOT DETECTED Final   Pseudomonas aeruginosa NOT DETECTED NOT DETECTED Final   Candida albicans NOT DETECTED NOT DETECTED Final   Candida glabrata NOT DETECTED NOT DETECTED Final   Candida krusei NOT DETECTED NOT DETECTED Final   Candida parapsilosis NOT DETECTED NOT DETECTED Final   Candida tropicalis NOT DETECTED NOT DETECTED Final    Comment:  Performed at [facility] [address] [place], [place] [address]  Blood Culture (routine x 2)     Status: Abnormal   Collection Time: 04/03/19  1:08 PM   Specimen: BLOOD  Result Value Ref Range Status   Specimen Description   Final    BLOOD LEFT ANTECUBITAL Performed at [facility], [address] [place], [place] [address]    Special Requests   Final    BOTTLES DRAWN AEROBIC AND ANAEROBIC Blood Culture adequate volume Performed at [facility], [address] [place], [place] [address]    Culture  Setup Time   Final    GRAM POSITIVE COCCI IN BOTH AEROBIC AND ANAEROBIC BOTTLES CRITICAL VALUE NOTED.  VALUE IS CONSISTENT WITH PREVIOUSLY REPORTED AND CALLED VALUE. Performed at [facility], [address], [place], [place] [address]    Culture (A)  Final    ENTEROCOCCUS FAECALIS SUSCEPTIBILITIES PERFORMED ON PREVIOUS CULTURE WITHIN THE LAST 5 DAYS. Performed at [facility], [place] [address], [place],  [address]    Report Status 04/06/2019 FINAL  Final  Respiratory Panel by RT PCR (Flu A&B, Covid) - Nasopharyngeal Swab     Status: None   Collection Time: 04/03/19  1:08 PM   Specimen: Nasopharyngeal Swab  Result Value Ref Range Status   SARS Coronavirus 2 by RT PCR NEGATIVE NEGATIVE Final    Comment: (NOTE) SARS-CoV-2 target nucleic acids are NOT DETECTED. The SARS-CoV-2 RNA is generally detectable in upper respiratoy specimens during the acute phase of infection. The lowest concentration of SARS-CoV-2 viral copies this assay can detect is 131 copies/mL. A negative result does not preclude SARS-Cov-2 infection and should not be used as the sole basis for treatment or other patient management decisions. A negative result may occur with  improper specimen collection/handling, submission of specimen other than nasopharyngeal swab, presence of viral mutation(s) within the areas targeted by this assay, and inadequate number of  viral copies (<131 copies/mL). A negative result must be combined with  clinical observations, patient history, and epidemiological information. The expected result is Negative. Fact Sheet for Patients:  PinkCheek.be Fact Sheet for Healthcare Providers:  GravelBags.it This test is not yet ap proved or cleared by the [place] FDA and  has been authorized for detection and/or diagnosis of SARS-CoV-2 by FDA under an Emergency Use Authorization (EUA). This EUA will remain  in effect (meaning this test can be used) for the duration of the COVID-19 declaration under Section 564(b)(1) of the Act, 21 U.S.C. section 360bbb-3(b)(1), unless the authorization is terminated or revoked sooner.    Influenza A by PCR NEGATIVE NEGATIVE Final   Influenza B by PCR NEGATIVE NEGATIVE Final    Comment: (NOTE) The Xpert Xpress SARS-CoV-2/FLU/RSV assay is intended as an aid in  the diagnosis of influenza from Nasopharyngeal swab specimens and  should not be used as a sole basis for treatment. Nasal washings and  aspirates are unacceptable for Xpert Xpress SARS-CoV-2/FLU/RSV  testing. Fact Sheet for Patients: PinkCheek.be Fact Sheet for Healthcare Providers: GravelBags.it This test is not yet approved or cleared by the [place] FDA and  has been authorized for detection and/or diagnosis of SARS-CoV-2 by  FDA under an Emergency Use Authorization (EUA). This EUA will remain  in effect (meaning this test can be used) for the duration of the  Covid-19 declaration under Section 564(b)(1) of the Act, 21  U.S.C. section 360bbb-3(b)(1), unless the authorization is  terminated or revoked. Performed at [facility], [address] [place], [place] [address]   MRSA PCR Screening     Status: None   Collection Time: 04/03/19  4:49 PM   Specimen: Nasal Mucosa; Nasopharyngeal  Result  Value Ref Range Status   MRSA by PCR NEGATIVE NEGATIVE Final    Comment:        The GeneXpert MRSA Assay (FDA approved for NASAL specimens only), is one component of a comprehensive MRSA colonization surveillance program. It is not intended to diagnose MRSA infection nor to guide or monitor treatment for MRSA infections. Performed at [facility], [address] [place], [place] [address]   Urine culture     Status: None   Collection Time: 04/04/19  7:57 AM   Specimen: In/Out Cath Urine  Result Value Ref Range Status   Specimen Description   Final    IN/OUT CATH URINE Performed at [facility], [address] [place], [place] [address]    Special Requests   Final    NONE Performed at [facility] [address] [place], [place] [address]    Culture   Final    NO GROWTH Performed at [facility], [place] [address], [place], [place] [address]    Report Status 04/05/2019 FINAL  Final  Culture, blood (Routine X 2) w Reflex to ID Panel     Status: None (Preliminary result)   Collection Time: 04/06/19  2:02 PM   Specimen: BLOOD  Result Value Ref Range Status   Specimen Description BLOOD BLOOD LEFT HAND  Final   Special Requests   Final    BOTTLES DRAWN AEROBIC AND ANAEROBIC Blood Culture adequate volume   Culture   Final    NO GROWTH < 24 HOURS Performed at [facility], [address] [place], [place] [address]    Report Status PENDING  Incomplete         Radiology Studies: ECHOCARDIOGRAM COMPLETE  Result Date: 04/05/2019    ECHOCARDIOGRAM REPORT   Patient Name:  Kaliann K Borgen Date of Exam: 04/05/2019 Medical Rec #:  [id number]      Height:       64.0 in Accession #:    [id number]     Weight:       114.0 lb Date of Birth:  [birth year]       BSA:          1.54 m Patient Age:    50 years       BP:           79/54 mmHg Patient Gender: F              HR:           96 bpm. Exam Location:  [facility] Procedure: 2D Echo, Color Doppler  and Cardiac Doppler Indications:     R78.81 Bacteremia  History:         Patient has prior history of Echocardiogram examinations, most                  recent 02/22/2019. COPD and Stroke; Risk Factors:Diabetes.  Sonographer:     Charmayne Sheer RDCS (AE) Referring Phys:  [id number] Tsosie Billing Diagnosing Phys: Ida Rogue MD  Sonographer Comments: Image acquisition challenging due to patient body habitus. IMPRESSIONS  1. Left ventricular ejection fraction, by estimation, is 55 to 60%. The left ventricle has normal function. The left ventrical has no regional wall motion abnormalities. There is mildly increased left ventricular hypertrophy. Left ventricular diastolic parameters are consistent with Grade I diastolic dysfunction (impaired relaxation).  2. Right ventricular systolic function is normal. The right ventricular size is normal.  3. The mitral valve was not well visualized. no evidence of mitral valve regurgitation. No evidence of mitral stenosis.  4. The aortic valve was not well visualized. Aortic valve regurgitation is not visualized. No aortic stenosis is present.  5. The inferior vena cava is normal in size with greater than 50% respiratory variability, suggesting right atrial pressure of 3 mmHg.  6. Valves not well visualized. Challenging image study. FINDINGS  Left Ventricle: Left ventricular ejection fraction, by estimation, is 55 to 60%. The left ventricle has normal function. The left ventricle has no regional wall motion abnormalities. The left ventricular internal cavity size was normal in size. There is  mildly increased left ventricular hypertrophy. Left ventricular diastolic parameters are consistent with Grade I diastolic dysfunction (impaired relaxation). Right Ventricle: The right ventricular size is normal. No increase in right ventricular wall thickness. Right ventricular systolic function is normal. Left Atrium: Left atrial size was normal in size. Right Atrium: Right atrial size  was normal in size. Pericardium: There is no evidence of pericardial effusion. Mitral Valve: The mitral valve was not well visualized. Normal mobility of the mitral valve leaflets. Moderate mitral annular calcification. No evidence of mitral valve regurgitation. No evidence of mitral valve stenosis. MV peak gradient, 33.2 mmHg. The  mean mitral valve gradient is 10.0 mmHg. Tricuspid Valve: The tricuspid valve is normal in structure. Tricuspid valve regurgitation is not demonstrated. No evidence of tricuspid stenosis. Aortic Valve: The aortic valve was not well visualized. Aortic valve regurgitation is not visualized. No aortic stenosis is present. Aortic valve mean gradient measures 8.0 mmHg. Aortic valve peak gradient measures 13.0 mmHg. Aortic valve area, by VTI measures 2.47 cm. Pulmonic Valve: The pulmonic valve was normal in structure. Pulmonic valve regurgitation is not visualized. No evidence of pulmonic stenosis. Aorta: The aortic root is normal in size and structure. Venous: The inferior  vena cava is normal in size with greater than 50% respiratory variability, suggesting right atrial pressure of 3 mmHg. The inferior vena cava and the hepatic vein show a normal flow pattern. IAS/Shunts: No atrial level shunt detected by color flow Doppler.  LEFT VENTRICLE PLAX 2D LVIDd:         3.47 cm  Diastology LVIDs:         2.24 cm  LV e' lateral:   8.70 cm/s LV PW:         0.99 cm  LV E/e' lateral: 14.5 LV IVS:        1.01 cm  LV e' medial:    5.11 cm/s LVOT diam:     1.80 cm  LV E/e' medial:  24.7 LV SV:         72.78 ml LV SV Index:   21.57 LVOT Area:     2.54 cm  LEFT ATRIUM           Index       RIGHT ATRIUM           Index LA diam:      2.70 cm 1.75 cm/m  RA Area:     10.50 cm LA Vol (A4C): 29.9 ml 19.38 ml/m RA Volume:   21.30 ml  13.83 ml/m  AORTIC VALVE                    PULMONIC VALVE AV Area (Vmax):    2.32 cm     PV Vmax:       1.55 m/s AV Area (Vmean):   2.21 cm     PV Vmean:      109.000 cm/s AV  Area (VTI):     2.47 cm     PV VTI:        0.288 m AV Vmax:           180.00 cm/s  PV Peak grad:  9.6 mmHg AV Vmean:          129.000 cm/s PV Mean grad:  6.0 mmHg AV VTI:            0.295 m AV Peak Grad:      13.0 mmHg AV Mean Grad:      8.0 mmHg LVOT Vmax:         164.00 cm/s LVOT Vmean:        112.000 cm/s LVOT VTI:          0.286 m LVOT/AV VTI ratio: 0.97  AORTA Ao Root diam: 2.80 cm MITRAL VALVE MV Area (PHT): 2.99 cm              SHUNTS MV Peak grad:  33.2 mmHg             Systemic VTI:  0.29 m MV Mean grad:  10.0 mmHg             Systemic Diam: 1.80 cm MV Vmax:       2.88 m/s MV Vmean:      139.0 cm/s MV Decel Time: 254 msec MV E velocity: 126.00 cm/s 103 cm/s MV A velocity: 200.00 cm/s 70.3 cm/s MV E/A ratio:  0.63        1.5 Ida Rogue MD Electronically signed by Ida Rogue MD Signature Date/Time: 04/05/2019/6:34:24 PM    Final         Scheduled Meds: . busPIRone  10 mg Oral BID  . chlorhexidine  15 mL Mouth Rinse BID  .  collagenase   Topical BID  . hydrocortisone sod succinate (SOLU-CORTEF) inj  50 mg Intravenous BID  . sodium chloride flush  3 mL Intravenous Q12H   Continuous Infusions: . sodium chloride Stopped (04/06/19 1539)     LOS: 4 days    Time spent: 30 mins     Wyvonnia Dusky, MD [facility] Pager [phone]  If 7PM-7AM, please contact night-coverage [email or web address] 04/07/2019, 7:29 AM

## 2019-04-08 NOTE — Plan of Care (Signed)
Patient has a  sacral decubitus ulcer that has been debrided and dressings are changed twice daily. Patient is also being turned every 2 hours to keep her off the ulcer.  Will continue to monitor.  Miranda Ryan

## 2019-04-08 NOTE — Progress Notes (Signed)
PROGRESS NOTE    Miranda Ryan  [id number] DOB: [birth year] DOA: 04/03/2019 PCP: [place]      Assessment & Plan:   Active Problems:   Septic shock (HCC)   Pressure injury of skin   Bacteremia   Goals of care, counseling/discussion   Palliative care by specialist   Encounter for hospice care discussion   Malnutrition of moderate degree  Septic shock: etiology unclear possibly secondary to bacteremia & decubitus ulcer. Abxs stopped as pt's daughter decided to proceed w/ comfort care only .   Bacteremia: 2 of 2 growing enterococcus. Continue w/ comfort care only   Sacral decubitus ulcer stage II/III: present on admission. Comfort care only   Leukocytosis: likely secondary to infection.   AKI on CKDIIIb: possibly secondary to ATN from shock.  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  Hypokalemia: will not be checking labs anymore   Moderate malnutrition: continue w/ supplements as tolerated   Metabolic acidosis: resolved  Lactic acidosis: resolved  Hx recent acute ischemic stroke: with associated UE weakness/dysphagia.  Was treated on last discharge with aspirin and Plavix with finishing Plavix on 1/22 per discharge summary.   DM2: comfort care only   Dementia: continue w/ supportive care  Bipolar disorder: unknown type or severity. Will continue on home dose of buspirone     DVT prophylaxis: comfort care only  Code Status: DNR Family Communication:  Disposition Plan: pt's daughter, [name], decided to proceed w/ full comfort care only. No beds available. Will continue w/ comfort care    Consultants:   ID  General surgery   ICU  Palliative care   Procedures:    Antimicrobials: abx d/c   Subjective:  Pt c/o weakness  Objective: Vitals:   04/06/19 1641 04/07/19 0651 04/07/19 1542 04/07/19 2126  BP: 130/88 121/89 (!) 153/74 140/83  Pulse: (!) 102 82 (!) 112 77  Resp: 18 18 19 20   Temp: 98.7 F (37.1 C) 97.7 F (36.5 C) 97.9  F (36.6 C) 98 F (36.7 C)  TempSrc: Oral Oral Oral Oral  SpO2: 95% 100% 100% 96%  Weight:      Height:        Intake/Output Summary (Last 24 hours) at 04/08/2019 0717 Last data filed at 04/08/2019 0449 Gross per 24 hour  Intake 363 ml  Output 1502 ml  Net -1139 ml   Filed Weights   04/03/19 1256 04/03/19 1630  Weight: 61.2 kg 51.7 kg    Examination:  General exam: Appears calm and comfortable  Respiratory system: decreased breath sounds b/l. No rales  Cardiovascular system: S1 & S2 +. No  rubs, gallops or clicks.  Gastrointestinal system: Abdomen is nondistended, soft and nontender. Normal bowel sounds heard. Central nervous system: Awake, oriented to person, place. Moves all 4 extremities Psychiatry: Judgement and insight appear abnormal. Flat mood and affect      Data Reviewed: I have personally reviewed following labs and imaging studies  CBC: Recent Labs  Lab 04/03/19 1302 04/03/19 1657 04/04/19 0549 04/05/19 0905 04/06/19 0446  WBC 22.9* 20.6* 19.2* 15.3* 13.2*  NEUTROABS 20.9*  --   --   --   --   HGB 10.0* 9.3* 9.1* 8.0* 8.2*  HCT 33.1* 31.1* 29.4* 25.6* 28.0*  MCV 83.8 85.7 83.1 82.1 87.5  PLT 407* 344 326 288 [id number]   Basic Metabolic Panel: Recent Labs  Lab 04/03/19 1302 04/04/19 0549 04/05/19 0905 04/05/19 1758 04/06/19 0446  NA 135 133* 138  --  141  K 4.3 4.3 2.5* 3.1* 3.8  CL 96* 97* 103  --  109  CO2 21* 22 25  --  23  GLUCOSE 243* 151* 146*  --  168*  BUN 58* 55* 44*  --  39*  CREATININE 1.67* 1.33* 1.06*  --  1.00  CALCIUM 8.8* 8.3* 8.1*  --  8.0*  MG  --   --  1.6*  --  2.6*  PHOS  --   --  3.1  --   --    GFR: Estimated Creatinine Clearance: 40.3 mL/min (by C-G formula based on SCr of 1 mg/dL). Liver Function Tests: Recent Labs  Lab 04/03/19 1302 04/05/19 0905 04/06/19 0446  AST 38 22 25  ALT 18 17 18   ALKPHOS 113 77 78  BILITOT 0.8 0.4 0.4  PROT 7.3 6.0* 6.0*  ALBUMIN 1.9* 1.7* 1.6*   No results for input(s): LIPASE,  AMYLASE in the last 168 hours. No results for input(s): AMMONIA in the last 168 hours. Coagulation Profile: Recent Labs  Lab 04/03/19 1302 04/04/19 0549  INR 1.0 1.0   Cardiac Enzymes: No results for input(s): CKTOTAL, CKMB, CKMBINDEX, TROPONINI in the last 168 hours. BNP (last 3 results) No results for input(s): PROBNP in the last 8760 hours. HbA1C: No results for input(s): HGBA1C in the last 72 hours. CBG: Recent Labs  Lab 04/05/19 1550 04/05/19 1930 04/05/19 2349 04/06/19 0414 04/06/19 0743  GLUCAP 190* 246* 287* 178* 136*   Lipid Profile: No results for input(s): CHOL, HDL, LDLCALC, TRIG, CHOLHDL, LDLDIRECT in the last 72 hours. Thyroid Function Tests: No results for input(s): TSH, T4TOTAL, FREET4, T3FREE, THYROIDAB in the last 72 hours. Anemia Panel: No results for input(s): VITAMINB12, FOLATE, FERRITIN, TIBC, IRON, RETICCTPCT in the last 72 hours. Sepsis Labs: Recent Labs  Lab 04/03/19 1302 04/03/19 1302 04/03/19 1657 04/04/19 0549 04/04/19 0942 04/04/19 1234 04/05/19 0905  PROCALCITON 3.32  --   --  2.40  --   --   --   LATICACIDVEN 5.8*   < > 5.9*  --  1.9 2.0* 1.4   < > = values in this interval not displayed.    Recent Results (from the past 240 hour(s))  Blood Culture (routine x 2)     Status: Abnormal   Collection Time: 04/03/19  1:02 PM   Specimen: BLOOD  Result Value Ref Range Status   Specimen Description   Final    BLOOD RIGHT ANTECUBITAL Performed at [facility], [address], [place], [place] [address]    Special Requests   Final    BOTTLES DRAWN AEROBIC AND ANAEROBIC Blood Culture adequate volume Performed at [facility], [address] [place], [place] [address]    Culture  Setup Time   Final    IN BOTH AEROBIC AND ANAEROBIC BOTTLES GRAM POSITIVE COCCI CRITICAL RESULT CALLED TO, READ BACK BY AND VERIFIED WITH: DAVID BESANTI ON 04/04/19 AT 0320 [facility]    Culture ENTEROCOCCUS FAECALIS (A)  Final   Report Status  04/06/2019 FINAL  Final   Organism ID, Bacteria ENTEROCOCCUS FAECALIS  Final      Susceptibility   Enterococcus faecalis - MIC*    AMPICILLIN <=2 SENSITIVE Sensitive     VANCOMYCIN 1 SENSITIVE Sensitive     GENTAMICIN SYNERGY SENSITIVE Sensitive     * ENTEROCOCCUS FAECALIS  Blood Culture ID Panel (Reflexed)     Status: Abnormal   Collection Time: 04/03/19  1:02 PM  Result Value Ref Range Status  Enterococcus species DETECTED (A) NOT DETECTED Final    Comment: CRITICAL RESULT CALLED TO, READ BACK BY AND VERIFIED WITH:  SHEEMA HALLAJI AT [id number] 04/04/19 SNG/SDR    Vancomycin resistance NOT DETECTED NOT DETECTED Final   Listeria monocytogenes NOT DETECTED NOT DETECTED Final   Staphylococcus species NOT DETECTED NOT DETECTED Final   Staphylococcus aureus (BCID) NOT DETECTED NOT DETECTED Final   Streptococcus species NOT DETECTED NOT DETECTED Final   Streptococcus agalactiae NOT DETECTED NOT DETECTED Final   Streptococcus pneumoniae NOT DETECTED NOT DETECTED Final   Streptococcus pyogenes NOT DETECTED NOT DETECTED Final   Acinetobacter baumannii NOT DETECTED NOT DETECTED Final   Enterobacteriaceae species NOT DETECTED NOT DETECTED Final   Enterobacter cloacae complex NOT DETECTED NOT DETECTED Final   Escherichia coli NOT DETECTED NOT DETECTED Final   Klebsiella oxytoca NOT DETECTED NOT DETECTED Final   Klebsiella pneumoniae NOT DETECTED NOT DETECTED Final   Proteus species NOT DETECTED NOT DETECTED Final   Serratia marcescens NOT DETECTED NOT DETECTED Final   Haemophilus influenzae NOT DETECTED NOT DETECTED Final   Neisseria meningitidis NOT DETECTED NOT DETECTED Final   Pseudomonas aeruginosa NOT DETECTED NOT DETECTED Final   Candida albicans NOT DETECTED NOT DETECTED Final   Candida glabrata NOT DETECTED NOT DETECTED Final   Candida krusei NOT DETECTED NOT DETECTED Final   Candida parapsilosis NOT DETECTED NOT DETECTED Final   Candida tropicalis NOT DETECTED NOT DETECTED Final     Comment: Performed at [facility], [address] [place], [place] [address]  Blood Culture (routine x 2)     Status: Abnormal   Collection Time: 04/03/19  1:08 PM   Specimen: BLOOD  Result Value Ref Range Status   Specimen Description   Final    BLOOD LEFT ANTECUBITAL Performed at [facility], [address], [place], [place] [address]    Special Requests   Final    BOTTLES DRAWN AEROBIC AND ANAEROBIC Blood Culture adequate volume Performed at [facility], [address] [place], [place] [address]    Culture  Setup Time   Final    GRAM POSITIVE COCCI IN BOTH AEROBIC AND ANAEROBIC BOTTLES CRITICAL VALUE NOTED.  VALUE IS CONSISTENT WITH PREVIOUSLY REPORTED AND CALLED VALUE. Performed at [facility], [address] [place], [place] [address]    Culture (A)  Final    ENTEROCOCCUS FAECALIS SUSCEPTIBILITIES PERFORMED ON PREVIOUS CULTURE WITHIN THE LAST 5 DAYS. Performed at [facility], [place] [address] [place], [place] [address]    Report Status 04/06/2019 FINAL  Final  Respiratory Panel by RT PCR (Flu A&B, Covid) - Nasopharyngeal Swab     Status: None   Collection Time: 04/03/19  1:08 PM   Specimen: Nasopharyngeal Swab  Result Value Ref Range Status   SARS Coronavirus 2 by RT PCR NEGATIVE NEGATIVE Final    Comment: (NOTE) SARS-CoV-2 target nucleic acids are NOT DETECTED. The SARS-CoV-2 RNA is generally detectable in upper respiratoy specimens during the acute phase of infection. The lowest concentration of SARS-CoV-2 viral copies this assay can detect is 131 copies/mL. A negative result does not preclude SARS-Cov-2 infection and should not be used as the sole basis for treatment or other patient management decisions. A negative result may occur with  improper specimen collection/handling, submission of specimen other than nasopharyngeal swab, presence of viral mutation(s) within the areas targeted by this assay, and inadequate  number of viral copies (<131 copies/mL). A negative result must be combined with  clinical observations, patient history, and epidemiological information. The expected result is Negative. Fact Sheet for Patients:  PinkCheek.be Fact Sheet for Healthcare Providers:  GravelBags.it This test is not yet ap proved or cleared by the [place] FDA and  has been authorized for detection and/or diagnosis of SARS-CoV-2 by FDA under an Emergency Use Authorization (EUA). This EUA will remain  in effect (meaning this test can be used) for the duration of the COVID-19 declaration under Section 564(b)(1) of the Act, 21 U.S.C. section 360bbb-3(b)(1), unless the authorization is terminated or revoked sooner.    Influenza A by PCR NEGATIVE NEGATIVE Final   Influenza B by PCR NEGATIVE NEGATIVE Final    Comment: (NOTE) The Xpert Xpress SARS-CoV-2/FLU/RSV assay is intended as an aid in  the diagnosis of influenza from Nasopharyngeal swab specimens and  should not be used as a sole basis for treatment. Nasal washings and  aspirates are unacceptable for Xpert Xpress SARS-CoV-2/FLU/RSV  testing. Fact Sheet for Patients: PinkCheek.be Fact Sheet for Healthcare Providers: GravelBags.it This test is not yet approved or cleared by the [place] FDA and  has been authorized for detection and/or diagnosis of SARS-CoV-2 by  FDA under an Emergency Use Authorization (EUA). This EUA will remain  in effect (meaning this test can be used) for the duration of the  Covid-19 declaration under Section 564(b)(1) of the Act, 21  U.S.C. section 360bbb-3(b)(1), unless the authorization is  terminated or revoked. Performed at [facility], [address] [place], [place] [address]   MRSA PCR Screening     Status: None   Collection Time: 04/03/19  4:49 PM   Specimen: Nasal Mucosa;  Nasopharyngeal  Result Value Ref Range Status   MRSA by PCR NEGATIVE NEGATIVE Final    Comment:        The GeneXpert MRSA Assay (FDA approved for NASAL specimens only), is one component of a comprehensive MRSA colonization surveillance program. It is not intended to diagnose MRSA infection nor to guide or monitor treatment for MRSA infections. Performed at [facility], [address], [place], [place] [address]   Urine culture     Status: None   Collection Time: 04/04/19  7:57 AM   Specimen: In/Out Cath Urine  Result Value Ref Range Status   Specimen Description   Final    IN/OUT CATH URINE Performed at [facility], [address], [place], [place] [address]    Special Requests   Final    NONE Performed at [facility], [address] [place], [place] [address]    Culture   Final    NO GROWTH Performed at [facility], [address], [place], [place] [address]    Report Status 04/05/2019 FINAL  Final  Culture, blood (Routine X 2) w Reflex to ID Panel     Status: None (Preliminary result)   Collection Time: 04/06/19  2:02 PM   Specimen: BLOOD  Result Value Ref Range Status   Specimen Description BLOOD BLOOD LEFT HAND  Final   Special Requests   Final    BOTTLES DRAWN AEROBIC AND ANAEROBIC Blood Culture adequate volume   Culture   Final    NO GROWTH < 24 HOURS Performed at [facility], [address], [place],  [address]    Report Status PENDING  Incomplete         Radiology Studies: No results found.      Scheduled Meds: . busPIRone  10 mg Oral  BID  . chlorhexidine  15 mL Mouth Rinse BID  . collagenase   Topical BID  . hydrocortisone sod succinate (SOLU-CORTEF) inj  50 mg Intravenous BID  . sodium chloride flush  3 mL Intravenous Q12H   Continuous Infusions: . sodium chloride Stopped (04/06/19 1539)     LOS: 5 days    Time spent: 10 mins     Wyvonnia Dusky, MD [facility] Pager [phone]  If 7PM-7AM, please contact night-coverage [email or web address] 04/08/2019, 7:17 AM

## 2019-04-08 NOTE — Progress Notes (Signed)
04/08/19  As patient is comfort measures only, surgery team will sign off for now, so as to minimize the number of times she has to be turned and cause any discomfort with wound evaluation.  She can continue with Santyl dressing changes BID as currently doing.  Please feel free to call or consult if any concerns with her wound.  Olean Ree, MD

## 2019-04-08 NOTE — TOC Progression Note (Signed)
Follow up call to [facility] to check bed status. This Education officer, museum spoke with [place].  Per Caryl Pina no bed available today, patient remains on waiting list.   Elliot Gurney, [place] [employer] [phone]

## 2019-04-09 LAB — GLUCOSE, CAPILLARY: Glucose-Capillary: 213 mg/dL — ABNORMAL HIGH (ref 70–99)

## 2019-04-09 MED ORDER — SODIUM CHLORIDE 0.9 % IV SOLN
1.5000 g | Freq: Four times a day (QID) | INTRAVENOUS | Status: DC
  Administered 2019-04-09: 1.5 g via INTRAVENOUS
  Filled 2019-04-09 (×2): qty 4
  Filled 2019-04-09: qty 1.5

## 2019-04-09 MED ORDER — ENOXAPARIN SODIUM 30 MG/0.3ML ~~LOC~~ SOLN
30.0000 mg | SUBCUTANEOUS | Status: DC
  Administered 2019-04-09: 30 mg via SUBCUTANEOUS
  Filled 2019-04-09: qty 0.3

## 2019-04-09 MED ORDER — SODIUM CHLORIDE 0.9 % IV SOLN
3.0000 g | Freq: Four times a day (QID) | INTRAVENOUS | Status: DC
  Administered 2019-04-09 – 2019-04-11 (×7): 3 g via INTRAVENOUS
  Filled 2019-04-09: qty 8
  Filled 2019-04-09 (×2): qty 3
  Filled 2019-04-09: qty 8
  Filled 2019-04-09 (×3): qty 3
  Filled 2019-04-09: qty 8
  Filled 2019-04-09: qty 3
  Filled 2019-04-09 (×3): qty 8

## 2019-04-09 MED ORDER — INSULIN ASPART 100 UNIT/ML ~~LOC~~ SOLN
0.0000 [IU] | Freq: Three times a day (TID) | SUBCUTANEOUS | Status: DC
  Administered 2019-04-09 – 2019-04-10 (×2): 2 [IU] via SUBCUTANEOUS
  Administered 2019-04-10: 4 [IU] via SUBCUTANEOUS
  Administered 2019-04-10 – 2019-04-11 (×3): 1 [IU] via SUBCUTANEOUS
  Administered 2019-04-11: 4 [IU] via SUBCUTANEOUS
  Administered 2019-04-12 (×2): 1 [IU] via SUBCUTANEOUS
  Filled 2019-04-09 (×9): qty 1

## 2019-04-09 NOTE — Progress Notes (Signed)
Notified by [facility] that patient's daughter was no longer interested in a hospice home bed. Family has chosen to pursue further treatment. Referral and hospice home notified. Writer will not at this time engage with patient and family. Thank you. Flo Shanks BSN, RN, [facility] [employer] [phone]

## 2019-04-09 NOTE — Progress Notes (Signed)
PROGRESS NOTE    Miranda Ryan  [id number] DOB: [birth year] DOA: 04/03/2019 PCP: [facility]      Assessment & Plan:   Active Problems:   Septic shock (HCC)   Pressure injury of skin   Bacteremia   Goals of care, counseling/discussion   Palliative care by specialist   Encounter for hospice care discussion   Malnutrition of moderate degree  Septic shock: etiology unclear possibly secondary to bacteremia & decubitus ulcer. Abxs restarted as per request of pt's daughter   Bacteremia: 2 of 2 growing enterococcus. Restarted IV unasyn   Sacral decubitus ulcer stage II/III: present on admission. Continue w/ wound care  Leukocytosis: likely secondary to infection.   AKI on CKDIIIb: possibly secondary to ATN from shock.  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  Hypokalemia: will check labs in the AM   Moderate malnutrition: continue w/ supplements as tolerated   Metabolic acidosis: resolved  Lactic acidosis: resolved  Hx recent acute ischemic stroke: with associated UE weakness/dysphagia.  Was treated on last discharge with aspirin and Plavix with finishing Plavix on 1/22 per discharge summary.   DM2: will start SSI w/ accuchecks   Dementia: continue w/ supportive care  Bipolar disorder: unknown type or severity. Will continue on home dose of buspirone     DVT prophylaxis: SCDs. lovenox Code Status: DNR Family Communication:  Disposition Plan: pt's daughter, [name], decided to restart abxs to proceed w/ treatment. No longer comfort care  Consultants:   ID  General surgery   ICU  Palliative care   Procedures:    Antimicrobials: unasyn   Subjective:  Pt c/o being hot  Objective: Vitals:   04/07/19 0651 04/07/19 1542 04/07/19 2126 04/08/19 2108  BP: 121/89 (!) 153/74 140/83 127/84  Pulse: 82 (!) 112 77 (!) 59  Resp: 18 19 20 18   Temp: 97.7 F (36.5 C) 97.9 F (36.6 C) 98 F (36.7 C) 97.6 F (36.4 C)  TempSrc: Oral Oral Oral Oral   SpO2: 100% 100% 96% 96%  Weight:      Height:        Intake/Output Summary (Last 24 hours) at 04/09/2019 0754 Last data filed at 04/08/2019 2202 Gross per 24 hour  Intake 437 ml  Output -  Net 437 ml   Filed Weights   04/03/19 1256 04/03/19 1630  Weight: 61.2 kg 51.7 kg    Examination:  General exam: Appears calm and comfortable  Respiratory system: diminished breath sounds b/l. No rales  Cardiovascular system: S1 & S2 +. No  rubs, gallops or clicks.  Gastrointestinal system: Abdomen is nondistended, soft and nontender. Normal bowel sounds heard. Central nervous system: Awake, oriented to person.. Moves all 4 extremities Psychiatry: Judgement and insight appear abnormal. Flat mood and affect      Data Reviewed: I have personally reviewed following labs and imaging studies  CBC: Recent Labs  Lab 04/03/19 1302 04/03/19 1657 04/04/19 0549 04/05/19 0905 04/06/19 0446  WBC 22.9* 20.6* 19.2* 15.3* 13.2*  NEUTROABS 20.9*  --   --   --   --   HGB 10.0* 9.3* 9.1* 8.0* 8.2*  HCT 33.1* 31.1* 29.4* 25.6* 28.0*  MCV 83.8 85.7 83.1 82.1 87.5  PLT 407* 344 326 288 [id number]   Basic Metabolic Panel: Recent Labs  Lab 04/03/19 1302 04/04/19 0549 04/05/19 0905 04/05/19 1758 04/06/19 0446  NA 135 133* 138  --  141  K 4.3 4.3 2.5* 3.1* 3.8  CL 96* 97* 103  --  109  CO2 21* 22 25  --  23  GLUCOSE 243* 151* 146*  --  168*  BUN 58* 55* 44*  --  39*  CREATININE 1.67* 1.33* 1.06*  --  1.00  CALCIUM 8.8* 8.3* 8.1*  --  8.0*  MG  --   --  1.6*  --  2.6*  PHOS  --   --  3.1  --   --    GFR: Estimated Creatinine Clearance: 40.3 mL/min (by C-G formula based on SCr of 1 mg/dL). Liver Function Tests: Recent Labs  Lab 04/03/19 1302 04/05/19 0905 04/06/19 0446  AST 38 22 25  ALT 18 17 18   ALKPHOS 113 77 78  BILITOT 0.8 0.4 0.4  PROT 7.3 6.0* 6.0*  ALBUMIN 1.9* 1.7* 1.6*   No results for input(s): LIPASE, AMYLASE in the last 168 hours. No results for input(s): AMMONIA in the last  168 hours. Coagulation Profile: Recent Labs  Lab 04/03/19 1302 04/04/19 0549  INR 1.0 1.0   Cardiac Enzymes: No results for input(s): CKTOTAL, CKMB, CKMBINDEX, TROPONINI in the last 168 hours. BNP (last 3 results) No results for input(s): PROBNP in the last 8760 hours. HbA1C: No results for input(s): HGBA1C in the last 72 hours. CBG: Recent Labs  Lab 04/05/19 1550 04/05/19 1930 04/05/19 2349 04/06/19 0414 04/06/19 0743  GLUCAP 190* 246* 287* 178* 136*   Lipid Profile: No results for input(s): CHOL, HDL, LDLCALC, TRIG, CHOLHDL, LDLDIRECT in the last 72 hours. Thyroid Function Tests: No results for input(s): TSH, T4TOTAL, FREET4, T3FREE, THYROIDAB in the last 72 hours. Anemia Panel: No results for input(s): VITAMINB12, FOLATE, FERRITIN, TIBC, IRON, RETICCTPCT in the last 72 hours. Sepsis Labs: Recent Labs  Lab 04/03/19 1302 04/03/19 1302 04/03/19 1657 04/04/19 0549 04/04/19 0942 04/04/19 1234 04/05/19 0905  PROCALCITON 3.32  --   --  2.40  --   --   --   LATICACIDVEN 5.8*   < > 5.9*  --  1.9 2.0* 1.4   < > = values in this interval not displayed.    Recent Results (from the past 240 hour(s))  Blood Culture (routine x 2)     Status: Abnormal   Collection Time: 04/03/19  1:02 PM   Specimen: BLOOD  Result Value Ref Range Status   Specimen Description   Final    BLOOD RIGHT ANTECUBITAL Performed at [facility], [address] [place], [place] [address]    Special Requests   Final    BOTTLES DRAWN AEROBIC AND ANAEROBIC Blood Culture adequate volume Performed at [facility], [address] [place], [place] [address]    Culture  Setup Time   Final    IN BOTH AEROBIC AND ANAEROBIC BOTTLES GRAM POSITIVE COCCI CRITICAL RESULT CALLED TO, READ BACK BY AND VERIFIED WITH: DAVID BESANTI ON 04/04/19 AT 0320 [facility]    Culture ENTEROCOCCUS FAECALIS (A)  Final   Report Status 04/06/2019 FINAL  Final   Organism ID, Bacteria ENTEROCOCCUS FAECALIS  Final       Susceptibility   Enterococcus faecalis - MIC*    AMPICILLIN <=2 SENSITIVE Sensitive     VANCOMYCIN 1 SENSITIVE Sensitive     GENTAMICIN SYNERGY SENSITIVE Sensitive     * ENTEROCOCCUS FAECALIS  Blood Culture ID Panel (Reflexed)     Status: Abnormal   Collection Time: 04/03/19  1:02 PM  Result Value Ref Range Status   Enterococcus species DETECTED (A) NOT DETECTED Final    Comment: CRITICAL RESULT CALLED TO, READ  BACK BY AND VERIFIED WITH:  Nancy Fetter AT [id number] 04/04/19 SNG/SDR    Vancomycin resistance NOT DETECTED NOT DETECTED Final   Listeria monocytogenes NOT DETECTED NOT DETECTED Final   Staphylococcus species NOT DETECTED NOT DETECTED Final   Staphylococcus aureus (BCID) NOT DETECTED NOT DETECTED Final   Streptococcus species NOT DETECTED NOT DETECTED Final   Streptococcus agalactiae NOT DETECTED NOT DETECTED Final   Streptococcus pneumoniae NOT DETECTED NOT DETECTED Final   Streptococcus pyogenes NOT DETECTED NOT DETECTED Final   Acinetobacter baumannii NOT DETECTED NOT DETECTED Final   Enterobacteriaceae species NOT DETECTED NOT DETECTED Final   Enterobacter cloacae complex NOT DETECTED NOT DETECTED Final   Escherichia coli NOT DETECTED NOT DETECTED Final   Klebsiella oxytoca NOT DETECTED NOT DETECTED Final   Klebsiella pneumoniae NOT DETECTED NOT DETECTED Final   Proteus species NOT DETECTED NOT DETECTED Final   Serratia marcescens NOT DETECTED NOT DETECTED Final   Haemophilus influenzae NOT DETECTED NOT DETECTED Final   Neisseria meningitidis NOT DETECTED NOT DETECTED Final   Pseudomonas aeruginosa NOT DETECTED NOT DETECTED Final   Candida albicans NOT DETECTED NOT DETECTED Final   Candida glabrata NOT DETECTED NOT DETECTED Final   Candida krusei NOT DETECTED NOT DETECTED Final   Candida parapsilosis NOT DETECTED NOT DETECTED Final   Candida tropicalis NOT DETECTED NOT DETECTED Final    Comment: Performed at [facility], [address] [place],  [place] [address]  Blood Culture (routine x 2)     Status: Abnormal   Collection Time: 04/03/19  1:08 PM   Specimen: BLOOD  Result Value Ref Range Status   Specimen Description   Final    BLOOD LEFT ANTECUBITAL Performed at [facility], [address], [place], [place] [address]    Special Requests   Final    BOTTLES DRAWN AEROBIC AND ANAEROBIC Blood Culture adequate volume Performed at [facility], [address] [place], [place] [address]    Culture  Setup Time   Final    GRAM POSITIVE COCCI IN BOTH AEROBIC AND ANAEROBIC BOTTLES CRITICAL VALUE NOTED.  VALUE IS CONSISTENT WITH PREVIOUSLY REPORTED AND CALLED VALUE. Performed at [facility], [address] [place], [place] [address]    Culture (A)  Final    ENTEROCOCCUS FAECALIS SUSCEPTIBILITIES PERFORMED ON PREVIOUS CULTURE WITHIN THE LAST 5 DAYS. Performed at [facility], [place] [address] [place], [place] [address]    Report Status 04/06/2019 FINAL  Final  Respiratory Panel by RT PCR (Flu A&B, Covid) - Nasopharyngeal Swab     Status: None   Collection Time: 04/03/19  1:08 PM   Specimen: Nasopharyngeal Swab  Result Value Ref Range Status   SARS Coronavirus 2 by RT PCR NEGATIVE NEGATIVE Final    Comment: (NOTE) SARS-CoV-2 target nucleic acids are NOT DETECTED. The SARS-CoV-2 RNA is generally detectable in upper respiratoy specimens during the acute phase of infection. The lowest concentration of SARS-CoV-2 viral copies this assay can detect is 131 copies/mL. A negative result does not preclude SARS-Cov-2 infection and should not be used as the sole basis for treatment or other patient management decisions. A negative result may occur with  improper specimen collection/handling, submission of specimen other than nasopharyngeal swab, presence of viral mutation(s) within the areas targeted by this assay, and inadequate number of viral copies (<131 copies/mL). A negative result must be combined with  clinical observations, patient history, and epidemiological information. The expected result is Negative. Fact Sheet for Patients:  PinkCheek.be Fact Sheet for Healthcare Providers:  GravelBags.it This test is not yet ap proved or cleared by the [place] FDA and  has been authorized for detection and/or diagnosis of SARS-CoV-2 by FDA under an Emergency Use Authorization (EUA). This EUA will remain  in effect (meaning this test can be used) for the duration of the COVID-19 declaration under Section 564(b)(1) of the Act, 21 U.S.C. section 360bbb-3(b)(1), unless the authorization is terminated or revoked sooner.    Influenza A by PCR NEGATIVE NEGATIVE Final   Influenza B by PCR NEGATIVE NEGATIVE Final    Comment: (NOTE) The Xpert Xpress SARS-CoV-2/FLU/RSV assay is intended as an aid in  the diagnosis of influenza from Nasopharyngeal swab specimens and  should not be used as a sole basis for treatment. Nasal washings and  aspirates are unacceptable for Xpert Xpress SARS-CoV-2/FLU/RSV  testing. Fact Sheet for Patients: PinkCheek.be Fact Sheet for Healthcare Providers: GravelBags.it This test is not yet approved or cleared by the [place] FDA and  has been authorized for detection and/or diagnosis of SARS-CoV-2 by  FDA under an Emergency Use Authorization (EUA). This EUA will remain  in effect (meaning this test can be used) for the duration of the  Covid-19 declaration under Section 564(b)(1) of the Act, 21  U.S.C. section 360bbb-3(b)(1), unless the authorization is  terminated or revoked. Performed at [facility], [address] [place], [place] [address]   MRSA PCR Screening     Status: None   Collection Time: 04/03/19  4:49 PM   Specimen: Nasal Mucosa; Nasopharyngeal  Result Value Ref Range Status   MRSA by PCR NEGATIVE NEGATIVE Final     Comment:        The GeneXpert MRSA Assay (FDA approved for NASAL specimens only), is one component of a comprehensive MRSA colonization surveillance program. It is not intended to diagnose MRSA infection nor to guide or monitor treatment for MRSA infections. Performed at [facility], [address], [place], [place] [address]   Urine culture     Status: None   Collection Time: 04/04/19  7:57 AM   Specimen: In/Out Cath Urine  Result Value Ref Range Status   Specimen Description   Final    IN/OUT CATH URINE Performed at [facility], [address] [place], [place] [address]    Special Requests   Final    NONE Performed at [facility], [address], [place], [place] [address]    Culture   Final    NO GROWTH Performed at [facility], [place] [address], [place], [place] [address]    Report Status 04/05/2019 FINAL  Final  Culture, blood (Routine X 2) w Reflex to ID Panel     Status: None (Preliminary result)   Collection Time: 04/06/19  2:02 PM   Specimen: BLOOD  Result Value Ref Range Status   Specimen Description BLOOD BLOOD LEFT HAND  Final   Special Requests   Final    BOTTLES DRAWN AEROBIC AND ANAEROBIC Blood Culture adequate volume   Culture   Final    NO GROWTH 2 DAYS Performed at [facility], [address] [place], [place] [address]    Report Status PENDING  Incomplete         Radiology Studies: No results found.      Scheduled Meds: . busPIRone  10 mg Oral BID  . chlorhexidine  15 mL Mouth Rinse BID  . collagenase   Topical BID  .  hydrocortisone sod succinate (SOLU-CORTEF) inj  50 mg Intravenous BID  . sodium chloride flush  3 mL Intravenous Q12H   Continuous Infusions: . sodium chloride Stopped (04/06/19 1539)     LOS: 6 days    Time spent: 10 mins     Wyvonnia Dusky, MD [facility] Pager [phone]  If 7PM-7AM, please contact night-coverage [email or web address] 04/09/2019, 7:54  AM

## 2019-04-09 NOTE — Progress Notes (Signed)
Notified Dr. Lenise Herald that patient has been having multiple BM's today and that possibly she is having BM/ [place] discharge from her vagina. Will continue to monitor and tell next shift to notify Dr. If they see this again.

## 2019-04-09 NOTE — Progress Notes (Signed)
   04/09/19 1400  Clinical Encounter Type  Visited With Patient  Visit Type Initial  Referral From Nurse  Consult/Referral To Chaplain  While doing round, Chaplain stopped in to visit with patient. Patient didn't appear to understand what Chaplain was saying until she mentioned Reese's Cup on her bed, than patient looked up and slightly smiled.

## 2019-04-09 NOTE — Care Management Important Message (Signed)
Important Message  Patient Details  Name: Miranda Ryan MRN: [id number] Date of Birth: 1945/06/14   Medicare Important Message Given:  Yes     Dannette Barbara 04/09/2019, 11:42 AM

## 2019-04-09 NOTE — Progress Notes (Signed)
ID  Pt with enterococcus bacteremia  O/e Awake Confused Has smeared her face with chocolate   Patient Vitals for the past 24 hrs:  BP Temp Temp src Pulse Resp SpO2  04/08/19 2108 127/84 97.6 F (36.4 C) Oral (!) 59 18 96 %    CBC Latest Ref Rng & Units 04/06/2019 04/05/2019 04/04/2019  WBC 4.0 - 10.5 K/uL 13.2(H) 15.3(H) 19.2(H)  Hemoglobin 12.0 - 15.0 g/dL 8.2(L) 8.0(L) 9.1(L)  Hematocrit 36.0 - 46.0 % 28.0(L) 25.6(L) 29.4(L)  Platelets 150 - 400 K/uL 277 288 326    CMP Latest Ref Rng & Units 04/06/2019 04/05/2019 04/05/2019  Glucose 70 - 99 mg/dL 168(H) - 146(H)  BUN 8 - 23 mg/dL 39(H) - 44(H)  Creatinine 0.44 - 1.00 mg/dL 1.00 - 1.06(H)  Sodium 135 - 145 mmol/L 141 - 138  Potassium 3.5 - 5.1 mmol/L 3.8 3.1(L) 2.5(LL)  Chloride 98 - 111 mmol/L 109 - 103  CO2 22 - 32 mmol/L 23 - 25  Calcium 8.9 - 10.3 mg/dL 8.0(L) - 8.1(L)  Total Protein 6.5 - 8.1 g/dL 6.0(L) - 6.0(L)  Total Bilirubin 0.3 - 1.2 mg/dL 0.4 - 0.4  Alkaline Phos 38 - 126 U/L 78 - 77  AST 15 - 41 U/L 25 - 22  ALT 0 - 44 U/L 18 - 17    Impression/ recommendation  Enterococcus bacteremia Unclear why she has it. Could be the sacral decubitus UC Neg 2 d echo- suboptimal- valves not visualized well Discussed with daughter who wants to treat her for bacteremia with IV antibiotics and rescind comfort care for now. Will treat with unasyn until 04/19/19 Discussed with Care team

## 2019-04-10 ENCOUNTER — Inpatient Hospital Stay: Payer: Self-pay

## 2019-04-10 LAB — CBC
HCT: 25.8 % — ABNORMAL LOW (ref 36.0–46.0)
Hemoglobin: 8.1 g/dL — ABNORMAL LOW (ref 12.0–15.0)
MCH: 26 pg (ref 26.0–34.0)
MCHC: 31.4 g/dL (ref 30.0–36.0)
MCV: 83 fL (ref 80.0–100.0)
Platelets: 275 10*3/uL (ref 150–400)
RBC: 3.11 MIL/uL — ABNORMAL LOW (ref 3.87–5.11)
RDW: 18.5 % — ABNORMAL HIGH (ref 11.5–15.5)
WBC: 12.8 10*3/uL — ABNORMAL HIGH (ref 4.0–10.5)
nRBC: 0 % (ref 0.0–0.2)

## 2019-04-10 LAB — GLUCOSE, CAPILLARY
Glucose-Capillary: 196 mg/dL — ABNORMAL HIGH (ref 70–99)
Glucose-Capillary: 161 mg/dL — ABNORMAL HIGH (ref 70–99)
Glucose-Capillary: 233 mg/dL — ABNORMAL HIGH (ref 70–99)
Glucose-Capillary: 303 mg/dL — ABNORMAL HIGH (ref 70–99)
Glucose-Capillary: 294 mg/dL — ABNORMAL HIGH (ref 70–99)

## 2019-04-10 LAB — BASIC METABOLIC PANEL
Anion gap: 13 (ref 5–15)
BUN: 24 mg/dL — ABNORMAL HIGH (ref 8–23)
CO2: 32 mmol/L (ref 22–32)
Calcium: 7.7 mg/dL — ABNORMAL LOW (ref 8.9–10.3)
Chloride: 92 mmol/L — ABNORMAL LOW (ref 98–111)
Creatinine, Ser: 0.97 mg/dL (ref 0.44–1.00)
GFR calc Af Amer: >60 mL/min (ref >60)
GFR calc non Af Amer: 58 mL/min — ABNORMAL LOW (ref >60)
Glucose, Bld: 189 mg/dL — ABNORMAL HIGH (ref 70–99)
Potassium: 2.1 mmol/L — CL (ref 3.5–5.1)
Sodium: 137 mmol/L (ref 135–145)

## 2019-04-10 MED ORDER — ENSURE ENLIVE PO LIQD
237.0000 mL | Freq: Two times a day (BID) | ORAL | Status: DC
  Administered 2019-04-10 – 2019-04-12 (×5): 237 mL via ORAL

## 2019-04-10 MED ORDER — CHLORHEXIDINE GLUCONATE CLOTH 2 % EX PADS
6.0000 | MEDICATED_PAD | Freq: Every day | CUTANEOUS | Status: DC
  Administered 2019-04-10 – 2019-04-12 (×3): 6 via TOPICAL

## 2019-04-10 MED ORDER — POTASSIUM CHLORIDE 10 MEQ/100ML IV SOLN
10.0000 meq | INTRAVENOUS | Status: AC
  Administered 2019-04-10 (×6): 10 meq via INTRAVENOUS
  Filled 2019-04-10 (×4): qty 100

## 2019-04-10 MED ORDER — ADULT MULTIVITAMIN W/MINERALS CH
1.0000 | ORAL_TABLET | Freq: Every day | ORAL | Status: DC
  Administered 2019-04-11 – 2019-04-12 (×2): 1 via ORAL
  Filled 2019-04-10 (×2): qty 1

## 2019-04-10 MED ORDER — SODIUM CHLORIDE 0.9% FLUSH: 10.0000 mL | INTRAVENOUS | Status: DC | PRN

## 2019-04-10 MED ORDER — ASCORBIC ACID 500 MG PO TABS
500.0000 mg | ORAL_TABLET | Freq: Two times a day (BID) | ORAL | Status: DC
  Administered 2019-04-10 – 2019-04-12 (×4): 500 mg via ORAL
  Filled 2019-04-10 (×4): qty 1

## 2019-04-10 MED ORDER — ENOXAPARIN SODIUM 40 MG/0.4ML ~~LOC~~ SOLN
40.0000 mg | SUBCUTANEOUS | Status: DC
  Administered 2019-04-10 – 2019-04-12 (×3): 40 mg via SUBCUTANEOUS
  Filled 2019-04-10 (×3): qty 0.4

## 2019-04-10 NOTE — Progress Notes (Signed)
PROGRESS NOTE    Miranda Ryan  [id number] DOB: [birth year] DOA: 04/03/2019 PCP: [facility]      Assessment & Plan:   Active Problems:   Septic shock (HCC)   Pressure injury of skin   Bacteremia   Goals of care, counseling/discussion   Palliative care by specialist   Encounter for hospice care discussion   Malnutrition of moderate degree  Septic shock: etiology unclear possibly secondary to bacteremia & decubitus ulcer. Continue on IV abxs   Bacteremia: 2 of 2 growing enterococcus. Continue on IV unasyn. Repeat blood cx NGTD   Sacral decubitus ulcer stage II/III: present on admission. Continue w/ wound care  Leukocytosis: likely secondary to infection.   AKI on CKDIIIb: Cr is better than baseline. 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 No need for a transfusion at this time. Will continue to monitor   Hypokalemia: KCl repleated. Will continue to monitor    Moderate malnutrition: continue w/ supplements as tolerated   Metabolic acidosis: resolved  Lactic acidosis: resolved  Hx recent acute ischemic stroke: with associated UE weakness/dysphagia.  Was treated on last discharge with aspirin and Plavix with finishing Plavix on 1/22 per discharge summary.   DM2: will continue on SSI w/ accuchecks   Dementia: continue w/ supportive care  Bipolar disorder: unknown type or severity. Will continue on home dose of buspirone     DVT prophylaxis: SCDs. lovenox Code Status: DNR Family Communication:  Disposition Plan: discussed w/ pt's daughter, [name], via phone and answered  her questions; please call her tomorrow w/ updates   Consultants:   ID  General surgery   ICU  Palliative care   Procedures:    Antimicrobials: unasyn   Subjective:  Pt c/o fatigue  Objective: Vitals:   04/07/19 2126 04/08/19 2108 04/09/19 1956 04/10/19 0413  BP: 140/83 127/84 97/73 (!) 155/60  Pulse: 77 (!) 59 (!) 101 75  Resp: 20 18  16 17   Temp: 98 F (36.7 C) 97.6 F (36.4 C) 97.9 F (36.6 C) 98.2 F (36.8 C)  TempSrc: Oral Oral Oral   SpO2: 96% 96% 99% 95%  Weight:      Height:       No intake or output data in the 24 hours ending 04/10/19 0753 Filed Weights   04/03/19 1256 04/03/19 1630  Weight: 61.2 kg 51.7 kg    Examination:  General exam: Appears calm and comfortable  Respiratory system: decreased breath sounds b/l. No wheezes Cardiovascular system: S1 & S2 +. No  rubs, gallops or clicks.  Gastrointestinal system: Abdomen is nondistended, soft and nontender. Normal bowel sounds heard. Central nervous system: Awake, oriented to person. Moves all 4 extremities Psychiatry: Judgement and insight appear abnormal. Flat mood and affect      Data Reviewed: I have personally reviewed following labs and imaging studies  CBC: Recent Labs  Lab 04/03/19 1302 04/03/19 1302 04/03/19 1657 04/04/19 0549 04/05/19 0905 04/06/19 0446 04/10/19 0454  WBC 22.9*   < > 20.6* 19.2* 15.3* 13.2* 12.8*  NEUTROABS 20.9*  --   --   --   --   --   --   HGB 10.0*   < > 9.3* 9.1* 8.0* 8.2* 8.1*  HCT 33.1*   < > 31.1* 29.4* 25.6* 28.0* 25.8*  MCV 83.8   < > 85.7 83.1 82.1 87.5 83.0  PLT 407*   < > 344 326 288 277 275   < > = values in  this interval not displayed.   Basic Metabolic Panel: Recent Labs  Lab 04/03/19 1302 04/03/19 1302 04/04/19 0549 04/05/19 0905 04/05/19 1758 04/06/19 0446 04/10/19 0454  NA 135  --  133* 138  --  141 137  K 4.3   < > 4.3 2.5* 3.1* 3.8 2.1*  CL 96*  --  97* 103  --  109 92*  CO2 21*  --  22 25  --  23 32  GLUCOSE 243*  --  151* 146*  --  168* 189*  BUN 58*  --  55* 44*  --  39* 24*  CREATININE 1.67*  --  1.33* 1.06*  --  1.00 0.97  CALCIUM 8.8*  --  8.3* 8.1*  --  8.0* 7.7*  MG  --   --   --  1.6*  --  2.6*  --   PHOS  --   --   --  3.1  --   --   --    < > = values in this interval not displayed.   GFR: Estimated Creatinine Clearance: 41.5 mL/min (by C-G formula based on  SCr of 0.97 mg/dL). Liver Function Tests: Recent Labs  Lab 04/03/19 1302 04/05/19 0905 04/06/19 0446  AST 38 22 25  ALT 18 17 18   ALKPHOS 113 77 78  BILITOT 0.8 0.4 0.4  PROT 7.3 6.0* 6.0*  ALBUMIN 1.9* 1.7* 1.6*   No results for input(s): LIPASE, AMYLASE in the last 168 hours. No results for input(s): AMMONIA in the last 168 hours. Coagulation Profile: Recent Labs  Lab 04/03/19 1302 04/04/19 0549  INR 1.0 1.0   Cardiac Enzymes: No results for input(s): CKTOTAL, CKMB, CKMBINDEX, TROPONINI in the last 168 hours. BNP (last 3 results) No results for input(s): PROBNP in the last 8760 hours. HbA1C: No results for input(s): HGBA1C in the last 72 hours. CBG: Recent Labs  Lab 04/05/19 2349 04/06/19 0414 04/06/19 0743 04/09/19 1747 04/10/19 0406  GLUCAP 287* 178* 136* 213* 196*   Lipid Profile: No results for input(s): CHOL, HDL, LDLCALC, TRIG, CHOLHDL, LDLDIRECT in the last 72 hours. Thyroid Function Tests: No results for input(s): TSH, T4TOTAL, FREET4, T3FREE, THYROIDAB in the last 72 hours. Anemia Panel: No results for input(s): VITAMINB12, FOLATE, FERRITIN, TIBC, IRON, RETICCTPCT in the last 72 hours. Sepsis Labs: Recent Labs  Lab 04/03/19 1302 04/03/19 1302 04/03/19 1657 04/04/19 0549 04/04/19 0942 04/04/19 1234 04/05/19 0905  PROCALCITON 3.32  --   --  2.40  --   --   --   LATICACIDVEN 5.8*   < > 5.9*  --  1.9 2.0* 1.4   < > = values in this interval not displayed.    Recent Results (from the past 240 hour(s))  Blood Culture (routine x 2)     Status: Abnormal   Collection Time: 04/03/19  1:02 PM   Specimen: BLOOD  Result Value Ref Range Status   Specimen Description   Final    BLOOD RIGHT ANTECUBITAL Performed at [facility], [address] [place], [place] [address]    Special Requests   Final    BOTTLES DRAWN AEROBIC AND ANAEROBIC Blood Culture adequate volume Performed at [facility], [address] [place], [place]  [address]    Culture  Setup Time   Final    IN BOTH AEROBIC AND ANAEROBIC BOTTLES GRAM POSITIVE COCCI CRITICAL RESULT CALLED TO, READ BACK BY AND VERIFIED WITH: DAVID BESANTI ON 04/04/19 AT [place] [facility]  Culture ENTEROCOCCUS FAECALIS (A)  Final   Report Status 04/06/2019 FINAL  Final   Organism ID, Bacteria ENTEROCOCCUS FAECALIS  Final      Susceptibility   Enterococcus faecalis - MIC*    AMPICILLIN <=2 SENSITIVE Sensitive     VANCOMYCIN 1 SENSITIVE Sensitive     GENTAMICIN SYNERGY SENSITIVE Sensitive     * ENTEROCOCCUS FAECALIS  Blood Culture ID Panel (Reflexed)     Status: Abnormal   Collection Time: 04/03/19  1:02 PM  Result Value Ref Range Status   Enterococcus species DETECTED (A) NOT DETECTED Final    Comment: CRITICAL RESULT CALLED TO, READ BACK BY AND VERIFIED WITH:  SHEEMA HALLAJI AT [id number] 04/04/19 SNG/SDR    Vancomycin resistance NOT DETECTED NOT DETECTED Final   Listeria monocytogenes NOT DETECTED NOT DETECTED Final   Staphylococcus species NOT DETECTED NOT DETECTED Final   Staphylococcus aureus (BCID) NOT DETECTED NOT DETECTED Final   Streptococcus species NOT DETECTED NOT DETECTED Final   Streptococcus agalactiae NOT DETECTED NOT DETECTED Final   Streptococcus pneumoniae NOT DETECTED NOT DETECTED Final   Streptococcus pyogenes NOT DETECTED NOT DETECTED Final   Acinetobacter baumannii NOT DETECTED NOT DETECTED Final   Enterobacteriaceae species NOT DETECTED NOT DETECTED Final   Enterobacter cloacae complex NOT DETECTED NOT DETECTED Final   Escherichia coli NOT DETECTED NOT DETECTED Final   Klebsiella oxytoca NOT DETECTED NOT DETECTED Final   Klebsiella pneumoniae NOT DETECTED NOT DETECTED Final   Proteus species NOT DETECTED NOT DETECTED Final   Serratia marcescens NOT DETECTED NOT DETECTED Final   Haemophilus influenzae NOT DETECTED NOT DETECTED Final   Neisseria meningitidis NOT DETECTED NOT DETECTED Final   Pseudomonas aeruginosa NOT DETECTED NOT DETECTED Final    Candida albicans NOT DETECTED NOT DETECTED Final   Candida glabrata NOT DETECTED NOT DETECTED Final   Candida krusei NOT DETECTED NOT DETECTED Final   Candida parapsilosis NOT DETECTED NOT DETECTED Final   Candida tropicalis NOT DETECTED NOT DETECTED Final    Comment: Performed at [facility], [address] [place], [place] [address]  Blood Culture (routine x 2)     Status: Abnormal   Collection Time: 04/03/19  1:08 PM   Specimen: BLOOD  Result Value Ref Range Status   Specimen Description   Final    BLOOD LEFT ANTECUBITAL Performed at [facility], [address] [place], [place] [address]    Special Requests   Final    BOTTLES DRAWN AEROBIC AND ANAEROBIC Blood Culture adequate volume Performed at [facility], [address] [place], [place] [address]    Culture  Setup Time   Final    GRAM POSITIVE COCCI IN BOTH AEROBIC AND ANAEROBIC BOTTLES CRITICAL VALUE NOTED.  VALUE IS CONSISTENT WITH PREVIOUSLY REPORTED AND CALLED VALUE. Performed at [facility], [address], [place], [place] [address]    Culture (A)  Final    ENTEROCOCCUS FAECALIS SUSCEPTIBILITIES PERFORMED ON PREVIOUS CULTURE WITHIN THE LAST 5 DAYS. Performed at [facility], [place] [address] [place],  [address]    Report Status 04/06/2019 FINAL  Final  Respiratory Panel by RT PCR (Flu A&B, Covid) - Nasopharyngeal Swab     Status: None   Collection Time: 04/03/19  1:08 PM   Specimen: Nasopharyngeal Swab  Result Value Ref Range Status   SARS Coronavirus 2 by RT PCR NEGATIVE NEGATIVE Final    Comment: (NOTE) SARS-CoV-2 target nucleic acids are NOT DETECTED. The SARS-CoV-2 RNA is generally  detectable in upper respiratoy specimens during the acute phase of infection. The lowest concentration of SARS-CoV-2 viral copies this assay can detect is 131 copies/mL. A negative result does not preclude SARS-Cov-2 infection and should not be used as the sole basis for  treatment or other patient management decisions. A negative result may occur with  improper specimen collection/handling, submission of specimen other than nasopharyngeal swab, presence of viral mutation(s) within the areas targeted by this assay, and inadequate number of viral copies (<131 copies/mL). A negative result must be combined with clinical observations, patient history, and epidemiological information. The expected result is Negative. Fact Sheet for Patients:  PinkCheek.be Fact Sheet for Healthcare Providers:  GravelBags.it This test is not yet ap proved or cleared by the [place] FDA and  has been authorized for detection and/or diagnosis of SARS-CoV-2 by FDA under an Emergency Use Authorization (EUA). This EUA will remain  in effect (meaning this test can be used) for the duration of the COVID-19 declaration under Section 564(b)(1) of the Act, 21 U.S.C. section 360bbb-3(b)(1), unless the authorization is terminated or revoked sooner.    Influenza A by PCR NEGATIVE NEGATIVE Final   Influenza B by PCR NEGATIVE NEGATIVE Final    Comment: (NOTE) The Xpert Xpress SARS-CoV-2/FLU/RSV assay is intended as an aid in  the diagnosis of influenza from Nasopharyngeal swab specimens and  should not be used as a sole basis for treatment. Nasal washings and  aspirates are unacceptable for Xpert Xpress SARS-CoV-2/FLU/RSV  testing. Fact Sheet for Patients: PinkCheek.be Fact Sheet for Healthcare Providers: GravelBags.it This test is not yet approved or cleared by the [place] FDA and  has been authorized for detection and/or diagnosis of SARS-CoV-2 by  FDA under an Emergency Use Authorization (EUA). This EUA will remain  in effect (meaning this test can be used) for the duration of the  Covid-19 declaration under Section 564(b)(1) of the Act, 21  U.S.C. section  360bbb-3(b)(1), unless the authorization is  terminated or revoked. Performed at [facility], [address] [place], [place] [address]   MRSA PCR Screening     Status: None   Collection Time: 04/03/19  4:49 PM   Specimen: Nasal Mucosa; Nasopharyngeal  Result Value Ref Range Status   MRSA by PCR NEGATIVE NEGATIVE Final    Comment:        The GeneXpert MRSA Assay (FDA approved for NASAL specimens only), is one component of a comprehensive MRSA colonization surveillance program. It is not intended to diagnose MRSA infection nor to guide or monitor treatment for MRSA infections. Performed at [facility], [address], [place], [place] [address]   Urine culture     Status: None   Collection Time: 04/04/19  7:57 AM   Specimen: In/Out Cath Urine  Result Value Ref Range Status   Specimen Description   Final    IN/OUT CATH URINE Performed at [facility], [address] [place], [place] [address]    Special Requests   Final    NONE Performed at [facility], [address] [place], [place] [address]    Culture   Final    NO GROWTH Performed at [facility], [place] [address], [place], [place] [address]    Report Status 04/05/2019 FINAL  Final  Culture, blood (Routine X 2) w Reflex to ID Panel     Status: None (Preliminary result)   Collection Time: 04/06/19  2:02 PM   Specimen: BLOOD  Result  Value Ref Range Status   Specimen Description BLOOD BLOOD LEFT HAND  Final   Special Requests   Final    BOTTLES DRAWN AEROBIC AND ANAEROBIC Blood Culture adequate volume   Culture   Final    NO GROWTH 3 DAYS Performed at [facility], [address], [place], [place] [address]    Report Status PENDING  Incomplete  Aerobic Culture (superficial specimen)     Status: None (Preliminary result)   Collection Time: 04/09/19  7:44 PM   Specimen: Wound  Result Value Ref Range Status   Specimen Description   Final     WOUND Performed at [facility], [address] [place], [place] [address]    Special Requests   Final    NONE Performed at [facility], [address] [place], [place] [address]    Gram Stain   Final    RARE WBC PRESENT, PREDOMINANTLY PMN FEW YEAST Performed at [facility], [place] [address] [place], [place] [address]    Culture PENDING  Incomplete   Report Status PENDING  Incomplete         Radiology Studies: No results found.      Scheduled Meds: . busPIRone  10 mg Oral BID  . chlorhexidine  15 mL Mouth Rinse BID  . collagenase   Topical BID  . enoxaparin (LOVENOX) injection  40 mg Subcutaneous Q24H  . hydrocortisone sod succinate (SOLU-CORTEF) inj  50 mg Intravenous BID  . insulin aspart  0-6 Units Subcutaneous TID WC  . sodium chloride flush  3 mL Intravenous Q12H   Continuous Infusions: . sodium chloride Stopped (04/06/19 1539)  . ampicillin-sulbactam (UNASYN) IV 3 g (04/10/19 0432)  . potassium chloride 10 mEq (04/10/19 0717)     LOS: 7 days    Time spent: 30 mins     Wyvonnia Dusky, MD [facility] Pager [phone]  If 7PM-7AM, please contact night-coverage [email or web address] 04/10/2019, 7:53 AM

## 2019-04-10 NOTE — TOC Progression Note (Signed)
Transition of Care [facility]) - Progression Note    Patient Details  Name: Miranda Ryan MRN: [id number] Date of Birth: [birth year]  Transition of Care [facility]) CM/SW Contact  Beverly Sessions, RN Phone Number: 04/10/2019, 9:56 AM  Clinical Narrative:      RNCM attempted to call daughter to discuss discharging planning for when patient is medically ready for discharge.  No answer.  Voicemail box full.  Will attempt at a later time      Expected Discharge Plan and Services                                                 Social Determinants of Health (SDOH) Interventions    Readmission Risk Interventions No flowsheet data found.

## 2019-04-10 NOTE — Progress Notes (Signed)
Notified NP Randol Kern about patient's potassium level of 2.1. Six runs of potassium were ordered for patient.  Will continue to monitor.  Christene Slates

## 2019-04-10 NOTE — Progress Notes (Signed)
Nutrition Follow Up Note   DOCUMENTATION CODES:   Non-severe (moderate) malnutrition in context of chronic illness  INTERVENTION:   Provide Ensure Enlive po BID, each supplement provides 350 kcal and 20 grams of protein.  Daily MVI  Vitamin C '500mg'$  po BID   NUTRITION DIAGNOSIS:   Moderate Malnutrition related to chronic illness(COPD, dementia, dysphagia) as evidenced by moderate fat depletion, moderate muscle depletion.  GOAL:   Patient will meet greater than or equal to 90% of their needs  -not met   MONITOR:   PO intake, Supplement acceptance, Labs, Weight trends, Skin, I & O's  ASSESSMENT:   74 year old female with PMHx of bipolar disorder, anxiety, depression, DM, COPD, dementia, hx CVA, dysphagia, sacral decubitus ulcer admitted with septic shock, bacteremia, leukocytosis, AKI on CKD stage III.   Per chart review, pt/family no longer wishes to be on comfort care. RD will add back supplements and vitamins to help pt meet her estimated needs and support wound healing. Pt documented to be eating anywhere from 20-100% of meals in hospital. No new weight since 2/9; will request weekly weights.   Medications reviewed and include: lovenox, insulin, unasyn, KCl  Labs reviewed: K 2.1(L), BUN 24(H) P 3.1 wnl- 2/11 Wbc- 12.8(H), Hgb 8.1(L), Hct 25.8(L) cbgs- 213, 196, 161 x 24 hrs  Diet Order:   Diet Order            DIET - DYS 1 Room service appropriate? Yes; Fluid consistency: Thin  Diet effective now             EDUCATION NEEDS:   No education needs have been identified at this time  Skin:  Skin Assessment: Skin Integrity Issues: Skin Integrity Issues:: DTI, Stage II DTI: bilateral heels Stage II: sacrum (5.5cm x 4cm x 0.5cm)  Last BM:  2/16- type 6  Height:   Ht Readings from Last 1 Encounters:  04/03/19 '5\' 4"'$  (1.626 m)   Weight:   Wt Readings from Last 1 Encounters:  04/03/19 51.7 kg   Ideal Body Weight:  54.5 kg  BMI:  Body mass index is 19.56  kg/m.  Estimated Nutritional Needs:   Kcal:  1400-1600  Protein:  70-80 grams  Fluid:  1.4-1.6 L/day  Koleen Distance MS, RD, LDN Contact information available in Amion

## 2019-04-10 NOTE — Progress Notes (Signed)
VAS consulted for PIV. 3 unsuccessful attempt made to start PIV, veins blew easily, were tiny and skin is fragile and bruised. Jodie RN made aware. Pt.on IV antibiotic and K runs ordered. RN will notify MD to obtain PICC line order.

## 2019-04-10 NOTE — TOC Progression Note (Signed)
Transition of Care [facility]) - Progression Note    Patient Details  Name: Miranda Ryan MRN: [id number] Date of Birth: [birth year]  Transition of Care [facility]) CM/SW Contact  Beverly Sessions, RN Phone Number: 04/10/2019, 9:55 AM  Clinical Narrative:      Notified by bedside RN and MD that family no longer wanted hospice home and comfort care, that they wanted to purse treatment  Notified by ID pharmacist that patient would likely need IV antibiotics at discharge       Expected Discharge Plan and Services                                                 Social Determinants of Health (SDOH) Interventions    Readmission Risk Interventions No flowsheet data found.

## 2019-04-10 NOTE — Progress Notes (Signed)
Peripherally Inserted Central Catheter/Midline Placement  The IV Nurse has discussed with the patient and/or persons authorized to consent for the patient, the purpose of this procedure and the potential benefits and risks involved with this procedure.  The benefits include less needle sticks, lab draws from the catheter, and the patient may be discharged home with the catheter. Risks include, but not limited to, infection, bleeding, blood clot (thrombus formation), and puncture of an artery; nerve damage and irregular heartbeat and possibility to perform a PICC exchange if needed/ordered by physician.  Alternatives to this procedure were also discussed.  Bard Power PICC patient education guide, fact sheet on infection prevention and patient information card has been provided to patient /or left at bedside.    PICC/Midline Placement Documentation  PICC Single Lumen [id number] PICC Left Basilic 37 cm 0 cm (Active)  Indication for Insertion or Continuance of Line Poor Vasculature-patient has had multiple peripheral attempts or PIVs lasting less than 24 hours 04/10/19 1531  Exposed Catheter (cm) 0 cm 04/10/19 1531  Site Assessment Clean;Dry;Intact 04/10/19 1531  Line Status Flushed;Blood return noted;Saline locked 04/10/19 1531  Dressing Type Transparent 04/10/19 1531  Dressing Status Clean;Dry;Intact;Antimicrobial disc in place 04/10/19 1531  Dressing Change Due 04/17/19 04/10/19 1531       Scotty Court 04/10/2019, 3:33 PM

## 2019-04-11 DIAGNOSIS — E440 Moderate protein-calorie malnutrition: Secondary | ICD-10-CM

## 2019-04-11 DIAGNOSIS — E876 Hypokalemia: Secondary | ICD-10-CM

## 2019-04-11 DIAGNOSIS — L89150 Pressure ulcer of sacral region, unstageable: Secondary | ICD-10-CM

## 2019-04-11 DIAGNOSIS — R197 Diarrhea, unspecified: Secondary | ICD-10-CM

## 2019-04-11 LAB — CBC
HCT: 25.4 % — ABNORMAL LOW (ref 36.0–46.0)
Hemoglobin: 8 g/dL — ABNORMAL LOW (ref 12.0–15.0)
MCH: 26.2 pg (ref 26.0–34.0)
MCHC: 31.5 g/dL (ref 30.0–36.0)
MCV: 83.3 fL (ref 80.0–100.0)
Platelets: 287 10*3/uL (ref 150–400)
RBC: 3.05 MIL/uL — ABNORMAL LOW (ref 3.87–5.11)
RDW: 18.8 % — ABNORMAL HIGH (ref 11.5–15.5)
WBC: 10.2 10*3/uL (ref 4.0–10.5)
nRBC: 0 % (ref 0.0–0.2)

## 2019-04-11 LAB — POTASSIUM: Potassium: 3.7 mmol/L (ref 3.5–5.1)

## 2019-04-11 LAB — BASIC METABOLIC PANEL
Anion gap: 14 (ref 5–15)
BUN: 21 mg/dL (ref 8–23)
CO2: 34 mmol/L — ABNORMAL HIGH (ref 22–32)
Calcium: 7.5 mg/dL — ABNORMAL LOW (ref 8.9–10.3)
Chloride: 92 mmol/L — ABNORMAL LOW (ref 98–111)
Creatinine, Ser: 0.87 mg/dL (ref 0.44–1.00)
GFR calc Af Amer: >60 mL/min (ref >60)
GFR calc non Af Amer: >60 mL/min (ref >60)
Glucose, Bld: 214 mg/dL — ABNORMAL HIGH (ref 70–99)
Potassium: <2 mmol/L — CL (ref 3.5–5.1)
Sodium: 140 mmol/L (ref 135–145)

## 2019-04-11 LAB — GLUCOSE, CAPILLARY
Glucose-Capillary: 175 mg/dL — ABNORMAL HIGH (ref 70–99)
Glucose-Capillary: 176 mg/dL — ABNORMAL HIGH (ref 70–99)
Glucose-Capillary: 350 mg/dL — ABNORMAL HIGH (ref 70–99)
Glucose-Capillary: 285 mg/dL — ABNORMAL HIGH (ref 70–99)

## 2019-04-11 LAB — CULTURE, BLOOD (ROUTINE X 2)
Culture: NO GROWTH
Special Requests: ADEQUATE

## 2019-04-11 LAB — MAGNESIUM: Magnesium: 1.4 mg/dL — ABNORMAL LOW (ref 1.7–2.4)

## 2019-04-11 MED ORDER — PREDNISONE 20 MG PO TABS: 20.0000 mg | ORAL_TABLET | Freq: Every day | ORAL | Status: DC

## 2019-04-11 MED ORDER — METRONIDAZOLE 500 MG PO TABS
500.0000 mg | ORAL_TABLET | Freq: Three times a day (TID) | ORAL | Status: DC
  Administered 2019-04-11 – 2019-04-12 (×3): 500 mg via ORAL
  Filled 2019-04-11 (×5): qty 1

## 2019-04-11 MED ORDER — POTASSIUM CHLORIDE CRYS ER 20 MEQ PO TBCR
40.0000 meq | EXTENDED_RELEASE_TABLET | Freq: Two times a day (BID) | ORAL | Status: DC
  Administered 2019-04-11 – 2019-04-12 (×3): 40 meq via ORAL
  Filled 2019-04-11 (×3): qty 2

## 2019-04-11 MED ORDER — MAGNESIUM SULFATE 2 GM/50ML IV SOLN
2.0000 g | Freq: Once | INTRAVENOUS | Status: AC
  Administered 2019-04-11: 2 g via INTRAVENOUS
  Filled 2019-04-11: qty 50

## 2019-04-11 MED ORDER — LABETALOL HCL 5 MG/ML IV SOLN
5.0000 mg | INTRAVENOUS | Status: DC | PRN
  Administered 2019-04-11 (×2): 5 mg via INTRAVENOUS
  Filled 2019-04-11 (×2): qty 4

## 2019-04-11 MED ORDER — PREDNISONE 20 MG PO TABS
60.0000 mg | ORAL_TABLET | Freq: Every day | ORAL | Status: DC
  Administered 2019-04-12: 60 mg via ORAL
  Filled 2019-04-11: qty 3

## 2019-04-11 MED ORDER — POTASSIUM CHLORIDE 10 MEQ/100ML IV SOLN
10.0000 meq | INTRAVENOUS | Status: AC
  Administered 2019-04-11 (×6): 10 meq via INTRAVENOUS
  Filled 2019-04-11 (×6): qty 100

## 2019-04-11 MED ORDER — SODIUM CHLORIDE 0.9 % IV SOLN
2.0000 g | Freq: Four times a day (QID) | INTRAVENOUS | Status: DC
  Administered 2019-04-11 – 2019-04-12 (×4): 2 g via INTRAVENOUS
  Filled 2019-04-11 (×5): qty 2000
  Filled 2019-04-11: qty 2
  Filled 2019-04-11 (×3): qty 2000

## 2019-04-11 MED ORDER — PREDNISONE 20 MG PO TABS: 40.0000 mg | ORAL_TABLET | Freq: Every day | ORAL | Status: DC

## 2019-04-11 MED ORDER — PANTOPRAZOLE SODIUM 40 MG PO TBEC
40.0000 mg | DELAYED_RELEASE_TABLET | Freq: Every day | ORAL | Status: DC
  Administered 2019-04-12: 40 mg via ORAL
  Filled 2019-04-11 (×2): qty 1

## 2019-04-11 NOTE — Progress Notes (Signed)
   Date of Admission:  04/03/2019     ID: Miranda Ryan is a 74 y.o. female  Active Problems:   Septic shock ([place])   Pressure injury of skin   Bacteremia   Goals of care, counseling/discussion   Palliative care by specialist   Encounter for hospice care discussion   Malnutrition of moderate degree    Subjective: Has diarrhea Does not c/o pain abdomen No fever Ate well  Medications:  . vitamin C  500 mg Oral BID  . busPIRone  10 mg Oral BID  . chlorhexidine  15 mL Mouth Rinse BID  . Chlorhexidine Gluconate Cloth  6 each Topical Daily  . collagenase   Topical BID  . enoxaparin (LOVENOX) injection  40 mg Subcutaneous Q24H  . feeding supplement (ENSURE ENLIVE)  237 mL Oral BID BM  . hydrocortisone sod succinate (SOLU-CORTEF) inj  50 mg Intravenous BID  . insulin aspart  0-6 Units Subcutaneous TID WC  . multivitamin with minerals  1 tablet Oral Daily  . potassium chloride  40 mEq Oral BID  . sodium chloride flush  3 mL Intravenous Q12H    Objective: Vital signs in last 24 hours: Temp:  [97.5 F (36.4 C)-97.8 F (36.6 C)] 97.5 F (36.4 C) (02/17 0745) Pulse Rate:  [59-110] 97 (02/17 1004) Resp:  [16-20] 16 (02/17 0745) BP: (89-184)/(67-125) 179/84 (02/17 1004) SpO2:  [93 %-97 %] 93 % (02/17 0745)  PHYSICAL EXAM:  General: Awake, responds to simple commands, confused Lungs: Clear to auscultation bilaterally. No Wheezing or Rhonchi. No rales. Heart: Regular rate and rhythm, no murmur, rub or gallop. Abdomen: Soft, non-tender,not distended. Bowel sounds normal. No masses Sacral decubitus unstaegable Neurologic: did not examine in detail Lab Results Recent Labs    04/10/19 0454 04/11/19 0551  WBC 12.8* 10.2  HGB 8.1* 8.0*  HCT 25.8* 25.4*  NA 137 140  K 2.1* <2.0*  CL 92* 92*  CO2 32 34*  BUN 24* 21  CREATININE 0.97 0.87   Liver Panel No results for input(s): PROT, ALBUMIN, AST, ALT, ALKPHOS, BILITOT, BILIDIR, IBILI in the last 72 hours. Sedimentation  Rate No results for input(s): ESRSEDRATE in the last 72 hours. C-Reactive Protein No results for input(s): CRP in the last 72 hours.  Microbiology:  Studies/Results: [place] EKG SITE RITE  Result Date: 04/10/2019 If Site Rite image not attached, placement could not be confirmed due to current cardiac rhythm.    Assessment/Plan:   Enterococcus bacteremia on unasyn Source likely thought to be the sacral decub, UC N,  2 d was suboptimal but daughter did not want TEE- intially patient was comfort care but daughter wanted to complete IV antibiotic so it was rescinded Need antibiotic until 04/19/19  Diarrhea- has no fever, abdominal pain, tenderness, leucocytosis- so less likely it is cdiff- more antibiotic related Will change unasyn to ampicillin + flagyl ( for anerobic coverage for the sacral decubitus Watch for any change in her condition

## 2019-04-11 NOTE — Progress Notes (Signed)
Palliative: Miranda Ryan is resting quietly in bed.  She will open her eyes, but not speak to me.  I ask if she wants something to drink, and she nods yes.  She is able to take a sip from a straw without overt s/s of aspiration. There is no family at bedside at this time.  Daughter carmine arrives as Miranda Ryan is having another drink.  As she sees Carmine, she responds by lifting her head from the pillow and with a clear "hello!".  [name] states that Miranda Ryan is "looking so much better" and "doing so good".  We talk about low potassium yesterday and today, and the treatment plan.  [name] then looks at her mother and asks if she wants to get better and come home to [facility].  Miranda Ryan states that she does.  Conference with bedside nursing staff related to patient condition, diarrhea, plan of care.  Nursing staff shares that they also believed Miranda Ryan to be nonverbal.  Plan:   Continue to treat the treatable.  IV antibiotics until 2/25. Unsure of disposition.   58 minutes Quinn Axe, NP Palliative Medicine Team Team Phone # [phone] Greater than 50% of this time was spent counseling and coordinating care related to the above assessment and plan.

## 2019-04-11 NOTE — Progress Notes (Signed)
Patient's blood pressure was 184/78. Orders were placed for labetalol. Will continue to monitor.  Christene Slates  04/11/2019  6:28 AM

## 2019-04-11 NOTE — Progress Notes (Signed)
Inpatient Diabetes Program Recommendations  AACE/ADA: New Consensus Statement on Inpatient Glycemic Control (2015)  Target Ranges:  Prepandial:   less than 140 mg/dL      [facility] postprandial:   less than 180 mg/dL (1-2 hours)      Critically ill patients:  140 - 180 mg/dL   Lab Results  Component Value Date   GLUCAP 175 (H) 04/11/2019   HGBA1C 7.8 (H) 02/22/2019    Review of Glycemic Control Results for SAYLEE, LORD (MRN [id number]) as of 04/11/2019 10:43  Ref. Range 04/10/2019 07:42 04/10/2019 11:54 04/10/2019 16:44 04/10/2019 22:28 04/11/2019 08:08  Glucose-Capillary Latest Ref Range: 70 - 99 mg/dL 161 (H) 233 (H) 303 (H) 294 (H) 175 (H)   Diabetes history: DM 2 Outpatient Diabetes medications: Tradjenta 5 mg Daily, Metformin 500 mg bid Current orders for Inpatient glycemic control:  Novolog 0-6 units tid   A1c 7.8% on 02/22/19 Ensure Enlive bid between meals Solucortef 50 units bid  Inpatient Diabetes Program Recommendations:    Glucose trends increase after meal intake.   Pt may benefit from Novolog 2-3 units tid meal coverage if eating >50% of meals.  Thanks,  Tama Headings RN, MSN, BC-ADM Inpatient Diabetes Coordinator Team Pager [phone] (8a-5p)

## 2019-04-11 NOTE — Progress Notes (Signed)
PHARMACY CONSULT NOTE FOR:  OUTPATIENT  PARENTERAL ANTIBIOTIC THERAPY (OPAT)  Indication: Enterococcus Bacteremia Regimen: Ampicillin 2 grams IV Q6 hours until 04/19/2019 End date: 04/19/2019  IV antibiotic discharge orders are pended. To discharging provider:  please sign these orders via discharge navigator,  Select New Orders & click on the button choice - Manage This Unsigned Work.     Thank you for allowing pharmacy to be a part of this patient's care.  Miranda Ryan A Miranda Ryan 04/11/2019, 6:41 PM

## 2019-04-11 NOTE — Treatment Plan (Signed)
Diagnosis: Enterococcus bacteremia Baseline Creatinine <1    Allergies  Allergen Reactions  . Demerol [Meperidine] Other (See Comments)    Pt states she "turned green"     OPAT Orders Discharge antibiotics: Ampicillin 2  grams IV every 6 hours until 04/19/19  [facility] Per Protocol:including placement of biopatch  Labs weekly while on IV antibiotics: X__ CBC with differential  X__ CMP  _X_ Please pull line at completion of IV antibiotics  Fax weekly labs to [phone] No clinic follow up needed If any questions call [phone]

## 2019-04-11 NOTE — TOC Initial Note (Addendum)
Transition of Care [facility]) - Initial/Assessment Note    Patient Details  Name: Miranda Ryan MRN: [id number] Date of Birth: 10-Feb-1946  [facility] CM/SW Contact:    Candie Chroman, LCSW Phone Number: 04/11/2019, 9:54 AM  Clinical Narrative:   CSW called daughter, introduced role, and explained that discharge planning would be discussed. Patient recently discharged home from [facility] but was not set up with home health services. She was sent home with a hospital bed, hoyer lift, and wheelchair. CSW made daughter aware that per ID note, patient will be on IV abx until 2/25. Will provide CMS scores for home health agencies when daughter arrives today.           2:08 pm: Emailed daughter CMS scores for home health agencies that serve her zip code.      Expected Discharge Plan: [place] Barriers to Discharge: Continued Medical Work up   Patient Goals and CMS Choice        Expected Discharge Plan and Services Expected Discharge Plan: [place] Choice: [place] arrangements for the past 2 months: Single Family Home                                      Prior Living Arrangements/Services Living arrangements for the past 2 months: Single Family Home Lives with:: Adult Children Patient language and need for interpreter reviewed:: Yes Do you feel safe going back to the place where you live?: Yes      Need for Family Participation in Patient Care: Yes (Comment) Care giver support system in place?: Yes (comment) Current home services: DME Criminal Activity/Legal Involvement Pertinent to Current Situation/Hospitalization: No - Comment as needed  Activities of Daily Living      Permission Sought/Granted Permission sought to share information with : Family Supports    Share Information with NAME: [name]     Permission granted to share info w Relationship: Daughter  Permission granted to  share info w Contact Information: [phone]  Emotional Assessment Appearance:: Appears stated age Attitude/Demeanor/Rapport: Unable to Assess Affect (typically observed): Unable to Assess Orientation: : Oriented to Self, Oriented to Place, Oriented to Situation Alcohol / Substance Use: Not Applicable Psych Involvement: No (comment)  Admission diagnosis:  Dehydration [E86.0] Dysphagia [R13.10] Septic shock (HCC) [A41.9, R65.21] Pressure injury of sacral region, stage 2 [facility]) [L89.152] Patient Active Problem List   Diagnosis Date Noted  . Malnutrition of moderate degree 04/05/2019  . Goals of care, counseling/discussion   . Palliative care by specialist   . Encounter for hospice care discussion   . Pressure injury of skin 04/04/2019  . Bacteremia   . Septic shock ([place]) 04/03/2019  . Dysphagia 02/23/2019  . Low serum thyroid stimulating hormone (TSH) 02/23/2019  . Severe mitral valve stenosis 02/23/2019  . Acute CVA (cerebrovascular accident) ([place]) 02/22/2019  . Generalized weakness   . Suspected UTI   . Ischemic stroke diagnosed during current admission ([place]) 02/21/2019  . Acute exacerbation of chronic obstructive pulmonary disease (COPD) ([place]) 04/20/2016   PCP:  [facility]:   [facility], [place] - [address] [place] [address] Phone: [phone] Fax: [phone]  [place], [place] - [address] AT [facility] [address]  [place] [address] Phone: [phone] Fax: [phone]     Social Determinants of Health (SDOH) Interventions    Readmission Risk Interventions No flowsheet data found.

## 2019-04-11 NOTE — Progress Notes (Addendum)
PROGRESS NOTE    Miranda Ryan  [id number] DOB: 15-Oct-1945 DOA: 04/03/2019 PCP: [facility]    Brief Narrative:  Miranda Ryan is a 74 y.o. female with medical history significant of 74 year old woman presented with generalized weakness, decreased oral intake, difficulty swallowing, facial droop, difficulty ambulation who was discharged from the hospital on 02/24/19 after being admitted for acute ischemic stroke with with associated leftupper andlower extremity weakness, dysphagia, acute encephalopathy, generalized weakness, decreased oral intake. was brought in by daughter who takes care of the mother for "fever" as she was diaphoretic and the bed was wet.  She was found with septic shock    Consultants:   ID, general surgery, ICU, palliative care  Procedures: Echo  Antimicrobials:   Unasyn   Subjective: Per nsg, pt's HR jumped from low to 90's. K was low. She has several soft stools yesterday that are frequent but no diarrhea. Pt does not open eyes fully for me. Does not speak to me either, may nod  Objective: Vitals:   04/11/19 0607 04/11/19 0745 04/11/19 1004 04/11/19 1126  BP: (!) 184/78 (!) 179/107 (!) 179/84 (!) 165/77  Pulse: (!) 59 99 97 75  Resp:  16    Temp:  (!) 97.5 F (36.4 C)    TempSrc:  Oral    SpO2:  93%    Weight:      Height:        Intake/Output Summary (Last 24 hours) at 04/11/2019 1511 Last data filed at 04/11/2019 1459 Gross per 24 hour  Intake 1227 ml  Output 400 ml  Net 827 ml   Filed Weights   04/03/19 1256 04/03/19 1630  Weight: 61.2 kg 51.7 kg    Examination:  General exam: Appears calm and comfortable  Respiratory system: Clear to auscultation.  With poor respiratory effort normal. Cardiovascular system: S1 & S2 heard, RRR. No JVD, murmurs, rubs, gallops or clicks.  Gastrointestinal system: Abdomen is nondistended, soft and nontender.  Normal bowel sounds heard. Central nervous system: Alert and awake.  Grossly  intact  extremities: No edema Decubitus ulcer, base clean, no discharge. Personally looked with nsg. Skin: Warm dry Psychiatry: . Mood & affect appropriate appropriate for current setting.     Data Reviewed: I have personally reviewed following labs and imaging studies  CBC: Recent Labs  Lab 04/05/19 0905 04/06/19 0446 04/10/19 0454 04/11/19 0551  WBC 15.3* 13.2* 12.8* 10.2  HGB 8.0* 8.2* 8.1* 8.0*  HCT 25.6* 28.0* 25.8* 25.4*  MCV 82.1 87.5 83.0 83.3  PLT 288 277 275 [id number]   Basic Metabolic Panel: Recent Labs  Lab 04/05/19 0905 04/05/19 1758 04/06/19 0446 04/10/19 0454 04/11/19 0551  NA 138  --  141 137 140  K 2.5* 3.1* 3.8 2.1* <2.0*  CL 103  --  109 92* 92*  CO2 25  --  23 32 34*  GLUCOSE 146*  --  168* 189* 214*  BUN 44*  --  39* 24* 21  CREATININE 1.06*  --  1.00 0.97 0.87  CALCIUM 8.1*  --  8.0* 7.7* 7.5*  MG 1.6*  --  2.6*  --  1.4*  PHOS 3.1  --   --   --   --    GFR: Estimated Creatinine Clearance: 46.3 mL/min (by C-G formula based on SCr of 0.87 mg/dL). Liver Function Tests: Recent Labs  Lab 04/05/19 0905 04/06/19 0446  AST 22 25  ALT 17 18  ALKPHOS 77 78  BILITOT 0.4 0.4  PROT 6.0* 6.0*  ALBUMIN 1.7* 1.6*   No results for input(s): LIPASE, AMYLASE in the last 168 hours. No results for input(s): AMMONIA in the last 168 hours. Coagulation Profile: No results for input(s): INR, PROTIME in the last 168 hours. Cardiac Enzymes: No results for input(s): CKTOTAL, CKMB, CKMBINDEX, TROPONINI in the last 168 hours. BNP (last 3 results) No results for input(s): PROBNP in the last 8760 hours. HbA1C: No results for input(s): HGBA1C in the last 72 hours. CBG: Recent Labs  Lab 04/10/19 1154 04/10/19 1644 04/10/19 2228 04/11/19 0808 04/11/19 1159  GLUCAP 233* 303* 294* 175* 176*   Lipid Profile: No results for input(s): CHOL, HDL, LDLCALC, TRIG, CHOLHDL, LDLDIRECT in the last 72 hours. Thyroid Function Tests: No results for input(s): TSH,  T4TOTAL, FREET4, T3FREE, THYROIDAB in the last 72 hours. Anemia Panel: No results for input(s): VITAMINB12, FOLATE, FERRITIN, TIBC, IRON, RETICCTPCT in the last 72 hours. Sepsis Labs: Recent Labs  Lab 04/05/19 0905  LATICACIDVEN 1.4    Recent Results (from the past 240 hour(s))  Blood Culture (routine x 2)     Status: Abnormal   Collection Time: 04/03/19  1:02 PM   Specimen: BLOOD  Result Value Ref Range Status   Specimen Description   Final    BLOOD RIGHT ANTECUBITAL Performed at [facility], [address] [place], [place] [address]    Special Requests   Final    BOTTLES DRAWN AEROBIC AND ANAEROBIC Blood Culture adequate volume Performed at [facility], [address] [place], [place] [address]    Culture  Setup Time   Final    IN BOTH AEROBIC AND ANAEROBIC BOTTLES GRAM POSITIVE COCCI CRITICAL RESULT CALLED TO, READ BACK BY AND VERIFIED WITH: DAVID BESANTI ON 04/04/19 AT 0320 [facility]    Culture ENTEROCOCCUS FAECALIS (A)  Final   Report Status 04/06/2019 FINAL  Final   Organism ID, Bacteria ENTEROCOCCUS FAECALIS  Final      Susceptibility   Enterococcus faecalis - MIC*    AMPICILLIN <=2 SENSITIVE Sensitive     VANCOMYCIN 1 SENSITIVE Sensitive     GENTAMICIN SYNERGY SENSITIVE Sensitive     * ENTEROCOCCUS FAECALIS  Blood Culture ID Panel (Reflexed)     Status: Abnormal   Collection Time: 04/03/19  1:02 PM  Result Value Ref Range Status   Enterococcus species DETECTED (A) NOT DETECTED Final    Comment: CRITICAL RESULT CALLED TO, READ BACK BY AND VERIFIED WITH:  SHEEMA HALLAJI AT [id number] 04/04/19 SNG/SDR    Vancomycin resistance NOT DETECTED NOT DETECTED Final   Listeria monocytogenes NOT DETECTED NOT DETECTED Final   Staphylococcus species NOT DETECTED NOT DETECTED Final   Staphylococcus aureus (BCID) NOT DETECTED NOT DETECTED Final   Streptococcus species NOT DETECTED NOT DETECTED Final   Streptococcus agalactiae NOT DETECTED NOT DETECTED Final    Streptococcus pneumoniae NOT DETECTED NOT DETECTED Final   Streptococcus pyogenes NOT DETECTED NOT DETECTED Final   Acinetobacter baumannii NOT DETECTED NOT DETECTED Final   Enterobacteriaceae species NOT DETECTED NOT DETECTED Final   Enterobacter cloacae complex NOT DETECTED NOT DETECTED Final   Escherichia coli NOT DETECTED NOT DETECTED Final   Klebsiella oxytoca NOT DETECTED NOT DETECTED Final   Klebsiella pneumoniae NOT DETECTED NOT DETECTED Final   Proteus species NOT DETECTED NOT DETECTED Final   Serratia marcescens NOT DETECTED NOT DETECTED Final   Haemophilus influenzae NOT DETECTED NOT DETECTED Final   Neisseria meningitidis NOT DETECTED NOT DETECTED Final   Pseudomonas aeruginosa  NOT DETECTED NOT DETECTED Final   Candida albicans NOT DETECTED NOT DETECTED Final   Candida glabrata NOT DETECTED NOT DETECTED Final   Candida krusei NOT DETECTED NOT DETECTED Final   Candida parapsilosis NOT DETECTED NOT DETECTED Final   Candida tropicalis NOT DETECTED NOT DETECTED Final    Comment: Performed at [facility], [address] [place], [place] [address]  Blood Culture (routine x 2)     Status: Abnormal   Collection Time: 04/03/19  1:08 PM   Specimen: BLOOD  Result Value Ref Range Status   Specimen Description   Final    BLOOD LEFT ANTECUBITAL Performed at [facility], [address] [place], [place] [address]    Special Requests   Final    BOTTLES DRAWN AEROBIC AND ANAEROBIC Blood Culture adequate volume Performed at [facility], [address] [place], [place] [address]    Culture  Setup Time   Final    GRAM POSITIVE COCCI IN BOTH AEROBIC AND ANAEROBIC BOTTLES CRITICAL VALUE NOTED.  VALUE IS CONSISTENT WITH PREVIOUSLY REPORTED AND CALLED VALUE. Performed at [facility], [address], [place], [place] [address]    Culture (A)  Final    ENTEROCOCCUS FAECALIS SUSCEPTIBILITIES PERFORMED ON PREVIOUS CULTURE WITHIN THE LAST 5  DAYS. Performed at [facility], [place] [address] [place], [place] [address]    Report Status 04/06/2019 FINAL  Final  Respiratory Panel by RT PCR (Flu A&B, Covid) - Nasopharyngeal Swab     Status: None   Collection Time: 04/03/19  1:08 PM   Specimen: Nasopharyngeal Swab  Result Value Ref Range Status   SARS Coronavirus 2 by RT PCR NEGATIVE NEGATIVE Final    Comment: (NOTE) SARS-CoV-2 target nucleic acids are NOT DETECTED. The SARS-CoV-2 RNA is generally detectable in upper respiratoy specimens during the acute phase of infection. The lowest concentration of SARS-CoV-2 viral copies this assay can detect is 131 copies/mL. A negative result does not preclude SARS-Cov-2 infection and should not be used as the sole basis for treatment or other patient management decisions. A negative result may occur with  improper specimen collection/handling, submission of specimen other than nasopharyngeal swab, presence of viral mutation(s) within the areas targeted by this assay, and inadequate number of viral copies (<131 copies/mL). A negative result must be combined with clinical observations, patient history, and epidemiological information. The expected result is Negative. Fact Sheet for Patients:  PinkCheek.be Fact Sheet for Healthcare Providers:  GravelBags.it This test is not yet ap proved or cleared by the [place] FDA and  has been authorized for detection and/or diagnosis of SARS-CoV-2 by FDA under an Emergency Use Authorization (EUA). This EUA will remain  in effect (meaning this test can be used) for the duration of the COVID-19 declaration under Section 564(b)(1) of the Act, 21 U.S.C. section 360bbb-3(b)(1), unless the authorization is terminated or revoked sooner.    Influenza A by PCR NEGATIVE NEGATIVE Final   Influenza B by PCR NEGATIVE NEGATIVE Final    Comment: (NOTE) The Xpert Xpress SARS-CoV-2/FLU/RSV assay  is intended as an aid in  the diagnosis of influenza from Nasopharyngeal swab specimens and  should not be used as a sole basis for treatment. Nasal washings and  aspirates are unacceptable for Xpert Xpress SARS-CoV-2/FLU/RSV  testing. Fact Sheet for Patients: PinkCheek.be Fact Sheet for Healthcare Providers: GravelBags.it This test is not yet approved or cleared by the [place] FDA and  has been authorized for detection and/or  diagnosis of SARS-CoV-2 by  FDA under an Emergency Use Authorization (EUA). This EUA will remain  in effect (meaning this test can be used) for the duration of the  Covid-19 declaration under Section 564(b)(1) of the Act, 21  U.S.C. section 360bbb-3(b)(1), unless the authorization is  terminated or revoked. Performed at [facility], [address] [place], [place] [address]   MRSA PCR Screening     Status: None   Collection Time: 04/03/19  4:49 PM   Specimen: Nasal Mucosa; Nasopharyngeal  Result Value Ref Range Status   MRSA by PCR NEGATIVE NEGATIVE Final    Comment:        The GeneXpert MRSA Assay (FDA approved for NASAL specimens only), is one component of a comprehensive MRSA colonization surveillance program. It is not intended to diagnose MRSA infection nor to guide or monitor treatment for MRSA infections. Performed at [facility], [address], [place], [place] [address]   Urine culture     Status: None   Collection Time: 04/04/19  7:57 AM   Specimen: In/Out Cath Urine  Result Value Ref Range Status   Specimen Description   Final    IN/OUT CATH URINE Performed at [facility], [address], [place], [place] [address]    Special Requests   Final    NONE Performed at [facility], [address], [place], [place] [address]    Culture   Final    NO GROWTH Performed at [facility], [place] [address], [place], [place] [address]     Report Status 04/05/2019 FINAL  Final  Culture, blood (Routine X 2) w Reflex to ID Panel     Status: None   Collection Time: 04/06/19  2:02 PM   Specimen: BLOOD  Result Value Ref Range Status   Specimen Description BLOOD BLOOD LEFT HAND  Final   Special Requests   Final    BOTTLES DRAWN AEROBIC AND ANAEROBIC Blood Culture adequate volume   Culture   Final    NO GROWTH 5 DAYS Performed at [facility], [address], [place], [place] [address]    Report Status 04/11/2019 FINAL  Final  Aerobic Culture (superficial specimen)     Status: None (Preliminary result)   Collection Time: 04/09/19  7:44 PM   Specimen: Wound  Result Value Ref Range Status   Specimen Description   Final    WOUND Performed at [facility], [address] [place], [place] [address]    Special Requests   Final    NONE Performed at [facility], [address] [place], [place] [address]    Gram Stain RARE WBC PRESENT, PREDOMINANTLY PMN FEW YEAST   Final   Culture   Final    MODERATE YEAST CULTURE REINCUBATED FOR BETTER GROWTH Performed at [facility], [place] [address] [place], [place] [address]    Report Status PENDING  Incomplete         Radiology Studies: [place] EKG SITE RITE  Result Date: 04/10/2019 If Site Rite image not attached, placement could not be confirmed due to current cardiac rhythm.       Scheduled Meds: . vitamin C  500 mg Oral BID  . busPIRone  10 mg Oral BID  . chlorhexidine  15 mL Mouth Rinse BID  . Chlorhexidine Gluconate Cloth  6 each Topical Daily  . collagenase   Topical BID  . enoxaparin (LOVENOX) injection  40 mg Subcutaneous Q24H  .  feeding supplement (ENSURE ENLIVE)  237 mL Oral BID BM  . insulin aspart  0-6 Units Subcutaneous TID WC  . multivitamin with minerals  1 tablet Oral Daily  . pantoprazole  40 mg Oral Daily  . potassium chloride  40 mEq Oral BID  . sodium chloride flush  3 mL Intravenous Q12H   Continuous  Infusions: . sodium chloride 30 mL (04/11/19 1256)  . ampicillin-sulbactam (UNASYN) IV 3 g (04/11/19 1151)  . potassium chloride 10 mEq (04/11/19 1417)    Assessment & Plan:   Active Problems:   Septic shock (HCC)   Pressure injury of skin   Bacteremia   Goals of care, counseling/discussion   Palliative care by specialist   Encounter for hospice care discussion   Malnutrition of moderate degree   Septic shock: etiology  to bacteremia with Enter. Facael.  & decubitus ulcer. Continue on IV Unasyn till 2/25 DC IV steroids and start p.o. taper.  Bacteremia: 2 of 2 growing Enter. Face. Continue on IV unasyn 2/25 ID following- rec. Continue iv tx until 2/25. Will need PICC placed upon d/c. Repeat blood cx NGTD -pending  Sacral decubitus ulcer stage II/III: present on admission. Continue w/ wound care  Leukocytosis: likely secondary to infection and steroid IV Resolved Continue to monitor.   AKI on CKDIIIb:  Improved with IV hydration. 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 No need for a transfusion at this time. Will continue to monitor   Hypokalemia: KCl repleated. Will continue to monitor    Moderate malnutrition: continue w/ supplements as tolerated   Metabolic acidosis: resolved  Lactic acidosis: resolved  Hx recent acute ischemic stroke: with associated UEweakness/dysphagia.  Wastreated on last discharge with aspirin and Plavix with finishing Plavix on 1/22 per discharge summary.   DM2: will continue on SSI w/ accuchecks   Dementia: continue w/ supportive care  Bipolar disorder: unknown type or severity. Will continue on home dose of buspirone   Increased bowel movement-not quite diarrhea.  Is having multiple episodes.possibly 2/2 abx? Will ck stool culture/cdiff.  Hypokalemia- likely from increased BM Replace aggressively with po and IV.  Ck K level post K supplement.   DVT prophylaxis: SCDs. lovenox Code Status:  DNR Family Communication:  Had a long conversation with the daughter about discharging her home with home health when she she is stable, also updating her with labs and plans , and needing IV antibiotics until February 25.  Patient daughter became very angry and rude, raising her voice c/o not being happy with ER care, the rehab care, and also her mothers care here. She was yelling on the phone that she her mom needs to stay in the hospital until her abx are completed as she is concerned that patient pulls out her IV lines . Tried explaining to her , I am not sending her mother home today or tomorrow , that I am just updating her about her current status and the near future plans once she is stable to be discharged home. Explained to her it was my job as the provider who took over her mothers care to uptdate her. Also explained to her that I will talk to CM about whether her insurance will cover and allow her to stay here until abx was completed. Other option is rehab but pt started yelling at me again and demanding for me to transfer her to the "boss". I told her unfortuently I cannot transfer her as I dont know  how to do that, that she needs to call the hospital operator and they can get her to the right place. A  I tried to explain to her that we will discuss this with case management depending on what her insurance will cover.  Patient continued being rude and irate, again c/o of ER care at previous admission and other things and I told daughter unfortunately I cannot make any comments about her previous care as I was not involved during that time.  The conversation ended with her understanding that case management will look into her mother's insurance to see if there is coverage for her to stay here to complete her IV antibiotics and if not then she will need to go home with home health when medically stable   Disposition Plan:  Go home with home health., will need iv abx until 2/25. D/c when medically  stable as currently has the above issues. Case management updated with plans.     LOS: 8 days   Time spent: 50 minutes with more than 50% on [place], MD [facility] Pager [phone]  If 7PM-7AM, please contact night-coverage [email or web address] Password [facility] 04/11/2019, 3:11 PM

## 2019-04-12 LAB — AEROBIC CULTURE W GRAM STAIN (SUPERFICIAL SPECIMEN)

## 2019-04-12 LAB — BASIC METABOLIC PANEL
Anion gap: 8 (ref 5–15)
BUN: 20 mg/dL (ref 8–23)
CO2: 34 mmol/L — ABNORMAL HIGH (ref 22–32)
Calcium: 7.5 mg/dL — ABNORMAL LOW (ref 8.9–10.3)
Chloride: 97 mmol/L — ABNORMAL LOW (ref 98–111)
Creatinine, Ser: 0.92 mg/dL (ref 0.44–1.00)
GFR calc Af Amer: >60 mL/min (ref >60)
GFR calc non Af Amer: >60 mL/min (ref >60)
Glucose, Bld: 189 mg/dL — ABNORMAL HIGH (ref 70–99)
Potassium: 3.5 mmol/L (ref 3.5–5.1)
Sodium: 139 mmol/L (ref 135–145)

## 2019-04-12 LAB — GLUCOSE, CAPILLARY
Glucose-Capillary: 166 mg/dL — ABNORMAL HIGH (ref 70–99)
Glucose-Capillary: 161 mg/dL — ABNORMAL HIGH (ref 70–99)

## 2019-04-12 LAB — RESPIRATORY PANEL BY RT PCR (FLU A&B, COVID)
Influenza A by PCR: NEGATIVE
Influenza B by PCR: NEGATIVE
SARS Coronavirus 2 by RT PCR: NEGATIVE

## 2019-04-12 MED ORDER — BACID PO TABS
2.0000 | ORAL_TABLET | Freq: Three times a day (TID) | ORAL | Status: DC
  Filled 2019-04-12 (×2): qty 2

## 2019-04-12 MED ORDER — POLYVINYL ALCOHOL 1.4 % OP SOLN: 1.0000 [drp] | Freq: Four times a day (QID) | OPHTHALMIC | 0 refills | Status: AC | PRN

## 2019-04-12 MED ORDER — ACETAMINOPHEN 325 MG PO TABS: 650.0000 mg | ORAL_TABLET | Freq: Four times a day (QID) | ORAL | Status: AC | PRN

## 2019-04-12 MED ORDER — AMPICILLIN IV (FOR PTA / DISCHARGE USE ONLY): 2.0000 g | Freq: Four times a day (QID) | INTRAVENOUS | 0 refills | Status: AC

## 2019-04-12 MED ORDER — RISAQUAD PO CAPS: 1.0000 | ORAL_CAPSULE | Freq: Three times a day (TID) | ORAL | Status: AC

## 2019-04-12 MED ORDER — PREDNISONE 20 MG PO TABS: 20.0000 mg | ORAL_TABLET | Freq: Every day | ORAL | 0 refills | Status: AC

## 2019-04-12 MED ORDER — SODIUM CHLORIDE 0.9 % IV SOLN: 2.0000 g | Freq: Four times a day (QID) | INTRAVENOUS | Status: AC

## 2019-04-12 MED ORDER — PANTOPRAZOLE SODIUM 40 MG PO TBEC: 40.0000 mg | DELAYED_RELEASE_TABLET | Freq: Every day | ORAL | Status: AC

## 2019-04-12 MED ORDER — POTASSIUM CHLORIDE CRYS ER 20 MEQ PO TBCR: 40.0000 meq | EXTENDED_RELEASE_TABLET | Freq: Every day | ORAL | 0 refills | Status: DC

## 2019-04-12 MED ORDER — RISAQUAD PO CAPS: 1.0000 | ORAL_CAPSULE | Freq: Three times a day (TID) | ORAL | Status: DC

## 2019-04-12 MED ORDER — POTASSIUM CHLORIDE CRYS ER 20 MEQ PO TBCR: 40.0000 meq | EXTENDED_RELEASE_TABLET | Freq: Once | ORAL | Status: DC

## 2019-04-12 MED ORDER — ADULT MULTIVITAMIN W/MINERALS CH: 1.0000 | ORAL_TABLET | Freq: Every day | ORAL | Status: AC

## 2019-04-12 MED ORDER — METRONIDAZOLE 500 MG PO TABS: 500.0000 mg | ORAL_TABLET | Freq: Three times a day (TID) | ORAL | Status: AC

## 2019-04-12 MED ORDER — COLLAGENASE 250 UNIT/GM EX OINT: TOPICAL_OINTMENT | Freq: Two times a day (BID) | CUTANEOUS | 0 refills | Status: AC

## 2019-04-12 MED ORDER — PREDNISONE 20 MG PO TABS: 40.0000 mg | ORAL_TABLET | Freq: Every day | ORAL | Status: AC

## 2019-04-12 MED ORDER — SILVER NITRATE-POT NITRATE 75-25 % EX MISC: 10.0000 | CUTANEOUS | 0 refills | Status: DC | PRN

## 2019-04-12 MED ORDER — ASCORBIC ACID 500 MG PO TABS: 500.0000 mg | ORAL_TABLET | Freq: Two times a day (BID) | ORAL | Status: AC

## 2019-04-12 NOTE — Progress Notes (Signed)
Inpatient Diabetes Program Recommendations  AACE/ADA: New Consensus Statement on Inpatient Glycemic Control   Target Ranges:  Prepandial:   less than 140 mg/dL      [facility] postprandial:   less than 180 mg/dL (1-2 hours)      Critically ill patients:  140 - 180 mg/dL   Results for Miranda Ryan, Miranda Ryan (MRN [id number]) as of 04/12/2019 12:01  Ref. Range 04/11/2019 08:08 04/11/2019 11:59 04/11/2019 16:58 04/11/2019 20:53 04/12/2019 07:45  Glucose-Capillary Latest Ref Range: 70 - 99 mg/dL 175 (H) 176 (H) 350 (H) 285 (H) 166 (H)   Review of Glycemic Control  Diabetes history: DM2 Outpatient Diabetes medications: Tradjenta 5 mg daily, Metformin 500 mg BID Current orders for Inpatient glycemic control: Novolog 0-6 units TID with meals; Prednisone taper  Inpatient Diabetes Program Recommendations:    Insulin-Correction: Please consider adding Novolog 0-5 units QHS for bedtime correction.  Insulin-Meal Coverage: If steroids are continued and post prandial glucose is consistenty over 180 mg/dl, please consider ordering Novolog 2 units TID with meals for meal coverage if patient eats at least 50% of meals.  Thanks, Barnie Alderman, RN, MSN, CDE Diabetes Coordinator Inpatient Diabetes Program [phone] (Team Pager from 8am to 5pm)

## 2019-04-12 NOTE — Progress Notes (Signed)
Miranda Ryan to be D/C'd Skilled nursing facility per MD order.  AVS printed and placed in packet for receiving facility.  Allergies as of 04/12/2019      Reactions   Demerol [meperidine] Other (See Comments)   Pt states she "turned green"       Medication List    STOP taking these medications   ibuprofen 200 MG tablet Commonly known as: ADVIL     TAKE these medications   acetaminophen 325 MG tablet Commonly known as: TYLENOL Take 2 tablets (650 mg total) by mouth every 6 (six) hours as needed for mild pain (or Fever >/= 101).   acidophilus Caps capsule Take 1 capsule by mouth 3 (three) times daily.   ampicillin  IVPB Inject 2 g into the vein every 6 (six) hours for 8 days. Indication:  Enterococcus Bacteremia Last Day of Therapy:  04/19/2019 [facility] Per Protocol:including placement of biopatch  Labs weekly while on IV antibiotics: X__ CBC with differential X__ CMP  Please pull line at completion of IV antibiotics  Fax weekly labs to [phone] No clinic follow up needed If any questions call [phone]   ampicillin 2 g in sodium chloride 0.9 % 100 mL Inject 2 g into the vein every 6 (six) hours for 7 days.   ascorbic acid 500 MG tablet Commonly known as: VITAMIN C Take 1 tablet (500 mg total) by mouth 2 (two) times daily.   aspirin 81 MG EC tablet Take 1 tablet (81 mg total) by mouth daily.   atorvastatin 40 MG tablet Commonly known as: LIPITOR Take 1 tablet (40 mg total) by mouth daily at 6 PM.   busPIRone 10 MG tablet Commonly known as: BUSPAR Take 10 mg by mouth 2 (two) times daily.   collagenase ointment Commonly known as: SANTYL Apply topically 2 (two) times daily.   feeding supplement (ENSURE ENLIVE) Liqd Take 237 mLs by mouth 2 (two) times daily between meals.   metFORMIN 500 MG tablet Commonly known as: Glucophage Take 1 tablet (500 mg total) by mouth 2 (two) times daily with a meal.   metroNIDAZOLE 500 MG tablet Commonly known  as: FLAGYL Take 1 tablet (500 mg total) by mouth every 8 (eight) hours for 7 days.   multivitamin with minerals Tabs tablet Take 1 tablet by mouth daily. Start taking on: April 13, 2019   pantoprazole 40 MG tablet Commonly known as: PROTONIX Take 1 tablet (40 mg total) by mouth daily. Start taking on: April 13, 2019   polyvinyl alcohol 1.4 % ophthalmic solution Commonly known as: LIQUIFILM TEARS Place 1 drop into both eyes 4 (four) times daily as needed for dry eyes.   potassium chloride SA 20 MEQ tablet Commonly known as: KLOR-CON Take 2 tablets (40 mEq total) by mouth daily for 1 day. Start taking on: April 13, 2019   predniSONE 20 MG tablet Commonly known as: DELTASONE Take 2 tablets (40 mg total) by mouth daily with breakfast for 1 day. Start taking on: April 13, 2019   predniSONE 20 MG tablet Commonly known as: DELTASONE Take 1 tablet (20 mg total) by mouth daily with breakfast for 2 days. Start taking on: April 15, 2019   silver nitrate applicators [id number] % applicator Apply 10 Sticks topically as needed for bleeding.   Tradjenta 5 MG Tabs tablet Generic drug: linagliptin Take 5 mg by mouth daily.            Home Infusion Instuctions  (From admission, onward)  Start     Ordered   04/12/19 0000  Home infusion instructions    Question:  Instructions  Answer:  Flushing of vascular access device: 0.9% NaCl pre/post medication administration and prn patency; Heparin 100 u/ml, 5ml for implanted ports and Heparin 10u/ml, 2ml for all other central venous catheters.   04/12/19 1426          Vitals:   04/12/19 0604 04/12/19 1416  BP: (!) 159/64 (!) 153/85  Pulse: (!) 113 97  Resp:  14  Temp: 99.1 F (37.3 C) 98.4 F (36.9 C)  SpO2: 93% 97%    Pt denies pain at this time. No complaints noted.  An After Visit Summary was printed and given to EMS transport for receiving facility. Patient escorted via EMS and D/C to [facility].  Miranda Ryan 04/12/2019 4:12 PM

## 2019-04-12 NOTE — TOC Transition Note (Addendum)
Transition of Care [facility]) - CM/SW Discharge Note   Patient Details  Name: Miranda Ryan MRN: [id number] Date of Birth: Sep 29, 1945  [facility] CM/SW Contact:  Beverly Sessions, RN Phone Number: 04/12/2019, 2:53 PM   Clinical Narrative:    After lengthy discussion daughter has decided to purse SNF placement for IV antibiotics.   [facility] preference is [facility]. Additional clinical sent.  Tina at [facility] aware fl2 signed.  Bed search complete - [facility] able to offer and accept today.  Bed accepted.    Nurse to perform rapid covid test,  Then patient will discharge via EMS.   Daughter aware  EMS packet and DNR on chart      Barriers to Discharge: Continued Medical Work up   Patient Goals and CMS Choice        Discharge Placement                       Discharge Plan and Services     Post Acute Care Choice: Home Health                               Social Determinants of Health (SDOH) Interventions     Readmission Risk Interventions No flowsheet data found.

## 2019-04-12 NOTE — Care Management Important Message (Signed)
Important Message  Patient Details  Name: [name] MRN: [id number] Date of Birth: August 22, 1945   Medicare Important Message Given:  Yes     Dannette Barbara 04/12/2019, 12:18 PM

## 2019-04-12 NOTE — NC FL2 (Signed)
[place] LEVEL OF CARE SCREENING TOOL     IDENTIFICATION  Patient Name: Miranda Ryan Birthdate: 02/02/1946 Sex: female Admission Date (Current Location): 04/03/2019  [facility] and [place] Number:  Engineering geologist and Address:         Provider Number: [id number]  Attending Physician Name and Address:  Nolberto Hanlon, MD  Relative Name and Phone Number:       Current Level of Care: Hospital Recommended Level of Care: [place] Prior Approval Number:    Date Approved/Denied:   PASRR Number: pending  Discharge Plan: SNF    Current Diagnoses: Patient Active Problem List   Diagnosis Date Noted  . Malnutrition of moderate degree 04/05/2019  . Goals of care, counseling/discussion   . Palliative care by specialist   . Encounter for hospice care discussion   . Pressure injury of skin 04/04/2019  . Bacteremia   . Septic shock ([place]) 04/03/2019  . Dysphagia 02/23/2019  . Low serum thyroid stimulating hormone (TSH) 02/23/2019  . Severe mitral valve stenosis 02/23/2019  . Acute CVA (cerebrovascular accident) ([place]) 02/22/2019  . Generalized weakness   . Suspected UTI   . Ischemic stroke diagnosed during current admission ([place]) 02/21/2019  . Acute exacerbation of chronic obstructive pulmonary disease (COPD) ([place]) 04/20/2016    Orientation RESPIRATION BLADDER Height & Weight     Self  Normal Incontinent, External catheter Weight: 51.7 kg Height:  5\' 4"  (162.6 cm)  BEHAVIORAL SYMPTOMS/MOOD NEUROLOGICAL BOWEL NUTRITION STATUS      Incontinent Diet(DYS 1 thin liquid)  AMBULATORY STATUS COMMUNICATION OF NEEDS Skin   Total Care   PU Stage and Appropriate Care, Bruising                       Personal Care Assistance Level of Assistance              Functional Limitations Info             SPECIAL CARE FACTORS FREQUENCY  (IV antibiotics)                    Contractures Contractures Info: Not present    Additional  Factors Info  Code Status, Allergies Code Status Info: DNR Allergies Info: Demerol           Current Medications (04/12/2019):  This is the current hospital active medication list Current Facility-Administered Medications  Medication Dose Route Frequency Provider Last Rate Last Admin  . 0.9 %  sodium chloride infusion   Intravenous PRN Sharion Settler, NP 10 mL/hr at 04/11/19 1637 50 mL at 04/11/19 1637  . acetaminophen (TYLENOL) tablet 650 mg  650 mg Oral Q6H PRN Quinn Axe A, NP       Or  . acetaminophen (TYLENOL) suppository 650 mg  650 mg Rectal Q6H PRN Dove, Tasha A, NP      . ampicillin (OMNIPEN) 2 g in sodium chloride 0.9 % 100 mL IVPB  2 g Intravenous Q6H Tsosie Billing, MD 300 mL/hr at 04/12/19 0512 2 g at 04/12/19 0512  . antiseptic oral rinse (BIOTENE) solution 15 mL  15 mL Topical PRN Dove, Tasha A, NP      . ascorbic acid (VITAMIN C) tablet 500 mg  500 mg Oral BID Wyvonnia Dusky, MD   500 mg at 04/12/19 [id number]  . busPIRone (BUSPAR) tablet 10 mg  10 mg Oral BID Wyvonnia Dusky, MD   10 mg at 04/12/19 [id number]  .  chlorhexidine (PERIDEX) 0.12 % solution 15 mL  15 mL Mouth Rinse BID Wyvonnia Dusky, MD   15 mL at 04/12/19 0940  . Chlorhexidine Gluconate Cloth 2 % PADS 6 each  6 each Topical Daily Wyvonnia Dusky, MD   6 each at 04/12/19 0940  . collagenase (SANTYL) ointment   Topical BID Tylene Fantasia, PA-C   1 application at [id number]  . enoxaparin (LOVENOX) injection 40 mg  40 mg Subcutaneous Q24H Dallie Piles, RPH   40 mg at 04/11/19 1415  . feeding supplement (ENSURE ENLIVE) (ENSURE ENLIVE) liquid 237 mL  237 mL Oral BID BM Wyvonnia Dusky, MD   237 mL at 04/11/19 1447  . glycopyrrolate (ROBINUL) tablet 1 mg  1 mg Oral Q4H PRN Dove, Tasha A, NP       Or  . glycopyrrolate (ROBINUL) injection 0.2 mg  0.2 mg Subcutaneous Q4H PRN Dove, Tasha A, NP       Or  . glycopyrrolate (ROBINUL) injection 0.2 mg  0.2 mg Intravenous Q4H PRN Dove, Tasha A, NP       . haloperidol (HALDOL) tablet 0.5 mg  0.5 mg Oral Q4H PRN Dove, Tasha A, NP       Or  . haloperidol (HALDOL) 2 MG/ML solution 0.5 mg  0.5 mg Sublingual Q4H PRN Dove, Tasha A, NP       Or  . haloperidol lactate (HALDOL) injection 0.5 mg  0.5 mg Intravenous Q4H PRN Dove, Tasha A, NP      . insulin aspart (novoLOG) injection 0-6 Units  0-6 Units Subcutaneous TID WC Wyvonnia Dusky, MD   1 Units at 04/12/19 0830  . labetalol (NORMODYNE) injection 5 mg  5 mg Intravenous Q2H PRN Lang Snow, NP   5 mg at 04/11/19 1006  . LORazepam (ATIVAN) injection 0.5-2 mg  0.5-2 mg Intravenous Q4H PRN Dove, Tasha A, NP   2 mg at 04/06/19 1845  . metroNIDAZOLE (FLAGYL) tablet 500 mg  500 mg Oral Q8H Tsosie Billing, MD   500 mg at 04/12/19 [id number]  . morphine 2 MG/ML injection 1-2 mg  1-2 mg Intravenous Q2H PRN Dove, Tasha A, NP   1 mg at 04/07/19 1618  . multivitamin with minerals tablet 1 tablet  1 tablet Oral Daily Wyvonnia Dusky, MD   1 tablet at 04/12/19 [phone]  . ondansetron (ZOFRAN-ODT) disintegrating tablet 4 mg  4 mg Oral Q6H PRN Dove, Tasha A, NP       Or  . ondansetron (ZOFRAN) injection 4 mg  4 mg Intravenous Q6H PRN Dove, Tasha A, NP      . pantoprazole (PROTONIX) EC tablet 40 mg  40 mg Oral Daily Nolberto Hanlon, MD   40 mg at 04/12/19 0939  . polyvinyl alcohol (LIQUIFILM TEARS) 1.4 % ophthalmic solution 1 drop  1 drop Both Eyes QID PRN Dove, Tasha A, NP      . potassium chloride SA (KLOR-CON) CR tablet 40 mEq  40 mEq Oral BID Nolberto Hanlon, MD   40 mEq at 04/12/19 0938  . predniSONE (DELTASONE) tablet 60 mg  60 mg Oral Q breakfast Nolberto Hanlon, MD   60 mg at 04/12/19 [id number]   Followed by  . [START ON 04/14/2019] predniSONE (DELTASONE) tablet 40 mg  40 mg Oral Q breakfast Nolberto Hanlon, MD       Followed by  . [START ON 04/16/2019] predniSONE (DELTASONE) tablet 20 mg  20 mg Oral Q breakfast Amery, Sahar,  MD      . silver nitrate applicators applicator 10 Stick  10 Stick Topical PRN  Tylene Fantasia, PA-C      . sodium chloride flush (NS) 0.9 % injection 10-40 mL  10-40 mL Intracatheter PRN Wyvonnia Dusky, MD      . sodium chloride flush (NS) 0.9 % injection 3 mL  3 mL Intravenous Q12H Nolberto Hanlon, MD   3 mL at 04/12/19 0940     Discharge Medications: Please see discharge summary for a list of discharge medications.  Relevant Imaging Results:  Relevant Lab Results:   Additional Information SS# [id number]  Beverly Sessions, RN

## 2019-04-12 NOTE — Discharge Summary (Signed)
Miranda Ryan [id number] DOB: [birth year] DOA: 04/03/2019  PCP: [place] date: 04/03/2019 Discharge date: 04/12/2019  Admitted From: Home Disposition: [facility]  Recommendations for Outpatient Follow-up:  1. Follow up with PCP in 1 week 2. Please obtain BMP/CBC in 2 days. 3. Monitor potassium level      Discharge Condition:Stable CODE STATUS: DNR Diet recommendation:  Dysphagia 1 diet, pure with thin liquids liberalize diet  Brief/Interim Summary: Miranda Ryan is a 74 y.o. female with medical history significant of 74 year old woman presented with generalized weakness, decreased oral intake, difficulty swallowing, facial droop, difficulty ambulation who was discharged from the hospital on 02/24/19 after being admitted for acute ischemic stroke with with associated leftupper andlower extremity weakness, dysphagia, acute encephalopathy, generalized weakness, decreased oral intake. was brought in by daughter who takes care of the mother for "fever" as she was diaphoretic and the bed was wet.   Patient has a wound also on her buttocks and daughter was concerned it was causing her issue. In the ED patient was found hypotensive initially at 62/47, tachycardic, temp 96.3 , wbc 22.9, covid test pending,  Creatinine 1.67, lactic acid 5.8, procalcitonin of 3.32.    She was admitted to the stepdown unit and was started on IV antibiotics.  Blood cultures were taken.  She was fluid resuscitated as she was in septic shock.  Her septic shock was due to bacteremia which was Enterococcus faecalis likely secondary to decubitus ulcer.  She was started on IV Unasyn and will need to continue this until February 25 and metronidazole p.o. until 225 as well.  A PICC line was placed for her to receive IV antibiotics.  Repeat blood cultures have been negative to date.  She has been having soft stools but without profound leukocytosis.  Infectious disease via chat felt that this was not C.  difficile and likely is from Unasyn side effect.  Infectious disease recommended loperamide as needed if continued.  Today she has been stable.  Her potassium due to her soft multiple bowels was as low as 2 and has been resuscitated.  She did receive a potassium dose today.  She will need to take KCl 40 mEq tomorrow and the following day and have her potassium level checked in 2 days.  Her hemodynamics are stable today.  She also had acute kidney injury on chronic kidney disease stage IIIb which improved with IV hydration.  She was started on IV steroids on admission for septic shock and has been tapered to p.o. steroids.  She is stable to be discharged to rehab today.  Her potassium level is normal.  She does have a PICC line for her IV antibiotics which needs to be discontinued on the last day of her IV antibiotics completion.   #Septic shock: etiology  to bacteremia with Enter. Facael.  & decubitus ulcer.Continue on IV Unasyn till 2/25 Continue metronidazole till 2/25 Add lactobacillus  Bacteremia: 2 of 2 growing Enter. Face.Continue onIV unasyn 2/25 ID following- rec. Continue iv tx until 2/25.  D/C picc when iv abx completed  Repeat blood cx NGTD   Sacral decubitus ulcer stage II/III: present on admission. Continue w/ wound care  Leukocytosis: likely secondary to infection and steroid IV Resolved Continue to monitor.   AKI on CKDIIIb: Improved with IV hydration. 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 Will need priotic monitoring continue to monitor  Hypokalemia:Repleated agreesively, now resolved.  Needs monitoring. Kcl today , and next 2 days give kcl 20meq daily, then stop on Sunday 2/21. Check level on 2/21  Moderate malnutrition: continue w/ supplements as tolerated   Metabolic acidosis: resolved  Lactic acidosis: resolved. 2/2 shock  Hx recent acute ischemic stroke: with associated  UEweakness/dysphagia. Wastreated on last discharge with aspirin and Plavix with finishing Plavix on 1/22 per discharge summary.   DM2: willcontinue onSSI w/ accuchecks   Dementia: continue w/ supportive care  Bipolar disorder: unknown type or severity. Will continue on home dose of buspirone   Increased bowel movement-not quite diarrhea.  Is having multiple episodes.possibly 2/2 abx? Will ck stool culture/cdiff.  H Discharge Diagnoses:  Active Problems:   Septic shock (HCC)   Pressure injury of skin   Bacteremia   Goals of care, counseling/discussion   Palliative care by specialist   Encounter for hospice care discussion   Malnutrition of moderate degree    Discharge Instructions  Discharge Instructions    Call MD for:  temperature >100.4   Complete by: As directed    Diet - low sodium heart healthy   Complete by: As directed    Home infusion instructions   Complete by: As directed    Instructions: Flushing of vascular access device: 0.9% NaCl pre/post medication administration and prn patency; Heparin 100 u/ml, 93ml for implanted ports and Heparin 10u/ml, 28ml for all other central venous catheters.   Increase activity slowly   Complete by: As directed      Allergies as of 04/12/2019      Reactions   Demerol [meperidine] Other (See Comments)   Pt states she "turned green"       Medication List    STOP taking these medications   ibuprofen 200 MG tablet Commonly known as: ADVIL     TAKE these medications   acetaminophen 325 MG tablet Commonly known as: TYLENOL Take 2 tablets (650 mg total) by mouth every 6 (six) hours as needed for mild pain (or Fever >/= 101).   acidophilus Caps capsule Take 1 capsule by mouth 3 (three) times daily.   ampicillin  IVPB Inject 2 g into the vein every 6 (six) hours for 8 days. Indication:  Enterococcus Bacteremia Last Day of Therapy:  04/19/2019 [facility] Per Protocol:including placement of biopatch  Labs weekly  while on IV antibiotics: X__ CBC with differential X__ CMP  Please pull line at completion of IV antibiotics  Fax weekly labs to [phone] No clinic follow up needed If any questions call [phone]   ampicillin 2 g in sodium chloride 0.9 % 100 mL Inject 2 g into the vein every 6 (six) hours for 7 days.   ascorbic acid 500 MG tablet Commonly known as: VITAMIN C Take 1 tablet (500 mg total) by mouth 2 (two) times daily.   aspirin 81 MG EC tablet Take 1 tablet (81 mg total) by mouth daily.   atorvastatin 40 MG tablet Commonly known as: LIPITOR Take 1 tablet (40 mg total) by mouth daily at 6 PM.   busPIRone 10 MG tablet Commonly known as: BUSPAR Take 10 mg by mouth 2 (two) times daily.   collagenase ointment Commonly known as: SANTYL Apply topically 2 (two) times daily.   feeding supplement (ENSURE ENLIVE) Liqd Take 237 mLs by mouth 2 (two) times daily between meals.   metFORMIN 500 MG tablet Commonly known as: Glucophage Take 1 tablet (500 mg total) by mouth 2 (two) times daily with a meal.   metroNIDAZOLE  500 MG tablet Commonly known as: FLAGYL Take 1 tablet (500 mg total) by mouth every 8 (eight) hours for 7 days.   multivitamin with minerals Tabs tablet Take 1 tablet by mouth daily. Start taking on: April 13, 2019   pantoprazole 40 MG tablet Commonly known as: PROTONIX Take 1 tablet (40 mg total) by mouth daily. Start taking on: April 13, 2019   polyvinyl alcohol 1.4 % ophthalmic solution Commonly known as: LIQUIFILM TEARS Place 1 drop into both eyes 4 (four) times daily as needed for dry eyes.   potassium chloride SA 20 MEQ tablet Commonly known as: KLOR-CON Take 2 tablets (40 mEq total) by mouth daily for 1 day. Start taking on: April 13, 2019   predniSONE 20 MG tablet Commonly known as: DELTASONE Take 2 tablets (40 mg total) by mouth daily with breakfast for 1 day. Start taking on: April 13, 2019   predniSONE 20 MG  tablet Commonly known as: DELTASONE Take 1 tablet (20 mg total) by mouth daily with breakfast for 2 days. Start taking on: April 15, 2019   silver nitrate applicators [id number] % applicator Apply 10 Sticks topically as needed for bleeding.   Tradjenta 5 MG Tabs tablet Generic drug: linagliptin Take 5 mg by mouth daily.            Home Infusion Instuctions  (From admission, onward)         Start     Ordered   04/12/19 0000  Home infusion instructions    Question:  Instructions  Answer:  Flushing of vascular access device: 0.9% NaCl pre/post medication administration and prn patency; Heparin 100 u/ml, 64ml for implanted ports and Heparin 10u/ml, 78ml for all other central venous catheters.   04/12/19 1426         Contact information for after-discharge care    Destination    [facility] .   Service: Skilled Nursing Contact information: [place] [phone]             Allergies  Allergen Reactions  . Demerol [Meperidine] Other (See Comments)    Pt states she "turned green"     Consultations:  ID, ID, general surgery, ICU, palliative care   Procedures/Studies: DG Chest Port 1 View  Result Date: 04/03/2019 CLINICAL DATA:  Wound, weakness, COPD, dementia, diabetes mellitus EXAM: PORTABLE CHEST 1 VIEW COMPARISON:  Portable exam 1315 hours compared to 02/21/2019 FINDINGS: Normal heart size, mediastinal contours, and pulmonary vascularity. Atherosclerotic calcification aorta. Skin folds project over RIGHT chest. Lungs clear. No pulmonary infiltrate, pleural effusion or pneumothorax. Bones demineralized. IMPRESSION: No acute abnormalities. Electronically Signed   By: Lavonia Dana M.D.   On: 04/03/2019 13:27   ECHOCARDIOGRAM COMPLETE  Result Date: 04/05/2019    ECHOCARDIOGRAM REPORT   Patient Name:   IVONNA DELMONICO Date of Exam: 04/05/2019 Medical Rec #:  [id number]      Height:       64.0 in Accession  #:    [id number]     Weight:       114.0 lb Date of Birth:  [birth year]       BSA:          1.54 m Patient Age:    19 years       BP:           79/54 mmHg Patient Gender: F              HR:  96 bpm. Exam Location:  [facility] Procedure: 2D Echo, Color Doppler and Cardiac Doppler Indications:     R78.81 Bacteremia  History:         Patient has prior history of Echocardiogram examinations, most                  recent 02/22/2019. COPD and Stroke; Risk Factors:Diabetes.  Sonographer:     Charmayne Sheer RDCS (AE) Referring Phys:  [id number] Tsosie Billing Diagnosing Phys: Ida Rogue MD  Sonographer Comments: Image acquisition challenging due to patient body habitus. IMPRESSIONS  1. Left ventricular ejection fraction, by estimation, is 55 to 60%. The left ventricle has normal function. The left ventrical has no regional wall motion abnormalities. There is mildly increased left ventricular hypertrophy. Left ventricular diastolic parameters are consistent with Grade I diastolic dysfunction (impaired relaxation).  2. Right ventricular systolic function is normal. The right ventricular size is normal.  3. The mitral valve was not well visualized. no evidence of mitral valve regurgitation. No evidence of mitral stenosis.  4. The aortic valve was not well visualized. Aortic valve regurgitation is not visualized. No aortic stenosis is present.  5. The inferior vena cava is normal in size with greater than 50% respiratory variability, suggesting right atrial pressure of 3 mmHg.  6. Valves not well visualized. Challenging image study. FINDINGS  Left Ventricle: Left ventricular ejection fraction, by estimation, is 55 to 60%. The left ventricle has normal function. The left ventricle has no regional wall motion abnormalities. The left ventricular internal cavity size was normal in size. There is  mildly increased left ventricular hypertrophy. Left ventricular diastolic parameters are consistent with Grade I diastolic  dysfunction (impaired relaxation). Right Ventricle: The right ventricular size is normal. No increase in right ventricular wall thickness. Right ventricular systolic function is normal. Left Atrium: Left atrial size was normal in size. Right Atrium: Right atrial size was normal in size. Pericardium: There is no evidence of pericardial effusion. Mitral Valve: The mitral valve was not well visualized. Normal mobility of the mitral valve leaflets. Moderate mitral annular calcification. No evidence of mitral valve regurgitation. No evidence of mitral valve stenosis. MV peak gradient, 33.2 mmHg. The  mean mitral valve gradient is 10.0 mmHg. Tricuspid Valve: The tricuspid valve is normal in structure. Tricuspid valve regurgitation is not demonstrated. No evidence of tricuspid stenosis. Aortic Valve: The aortic valve was not well visualized. Aortic valve regurgitation is not visualized. No aortic stenosis is present. Aortic valve mean gradient measures 8.0 mmHg. Aortic valve peak gradient measures 13.0 mmHg. Aortic valve area, by VTI measures 2.47 cm. Pulmonic Valve: The pulmonic valve was normal in structure. Pulmonic valve regurgitation is not visualized. No evidence of pulmonic stenosis. Aorta: The aortic root is normal in size and structure. Venous: The inferior vena cava is normal in size with greater than 50% respiratory variability, suggesting right atrial pressure of 3 mmHg. The inferior vena cava and the hepatic vein show a normal flow pattern. IAS/Shunts: No atrial level shunt detected by color flow Doppler.  LEFT VENTRICLE PLAX 2D LVIDd:         3.47 cm  Diastology LVIDs:         2.24 cm  LV e' lateral:   8.70 cm/s LV PW:         0.99 cm  LV E/e' lateral: 14.5 LV IVS:        1.01 cm  LV e' medial:    5.11 cm/s LVOT diam:  1.80 cm  LV E/e' medial:  24.7 LV SV:         72.78 ml LV SV Index:   21.57 LVOT Area:     2.54 cm  LEFT ATRIUM           Index       RIGHT ATRIUM           Index LA diam:      2.70 cm  1.75 cm/m  RA Area:     10.50 cm LA Vol (A4C): 29.9 ml 19.38 ml/m RA Volume:   21.30 ml  13.83 ml/m  AORTIC VALVE                    PULMONIC VALVE AV Area (Vmax):    2.32 cm     PV Vmax:       1.55 m/s AV Area (Vmean):   2.21 cm     PV Vmean:      109.000 cm/s AV Area (VTI):     2.47 cm     PV VTI:        0.288 m AV Vmax:           180.00 cm/s  PV Peak grad:  9.6 mmHg AV Vmean:          129.000 cm/s PV Mean grad:  6.0 mmHg AV VTI:            0.295 m AV Peak Grad:      13.0 mmHg AV Mean Grad:      8.0 mmHg LVOT Vmax:         164.00 cm/s LVOT Vmean:        112.000 cm/s LVOT VTI:          0.286 m LVOT/AV VTI ratio: 0.97  AORTA Ao Root diam: 2.80 cm MITRAL VALVE MV Area (PHT): 2.99 cm              SHUNTS MV Peak grad:  33.2 mmHg             Systemic VTI:  0.29 m MV Mean grad:  10.0 mmHg             Systemic Diam: 1.80 cm MV Vmax:       2.88 m/s MV Vmean:      139.0 cm/s MV Decel Time: 254 msec MV E velocity: 126.00 cm/s 103 cm/s MV A velocity: 200.00 cm/s 70.3 cm/s MV E/A ratio:  0.63        1.5 Ida Rogue MD Electronically signed by Ida Rogue MD Signature Date/Time: 04/05/2019/6:34:24 PM    Final    [place] EKG SITE RITE  Result Date: 04/10/2019 If Site Rite image not attached, placement could not be confirmed due to current cardiac rhythm.     Subjective:   Discharge Exam: Vitals:   04/12/19 0604 04/12/19 1416  BP: (!) 159/64 (!) 153/85  Pulse: (!) 113 97  Resp:  14  Temp: 99.1 F (37.3 C) 98.4 F (36.9 C)  SpO2: 93% 97%   Vitals:   04/11/19 2054 04/12/19 0029 04/12/19 0604 04/12/19 1416  BP: (!) 147/92 (!) 144/84 (!) 159/64 (!) 153/85  Pulse: 99 (!) 114 (!) 113 97  Resp: 20 (!) 28  14  Temp: 99.7 F (37.6 C)  99.1 F (37.3 C) 98.4 F (36.9 C)  TempSrc: Oral  Oral Oral  SpO2: 100% 96% 93% 97%  Weight:      Height:  General: Pt is alert, awake, not in acute distress Cardiovascular: RRR, S1/S2 +, no rubs, no gallops Respiratory: CTA bilaterally, no wheezing, no  rhonchi Abdominal: Soft, NT, ND, bowel sounds + Extremities: no edema, no cyanosis    The results of significant diagnostics from this hospitalization (including imaging, microbiology, ancillary and laboratory) are listed below for reference.     Microbiology: Recent Results (from the past 240 hour(s))  Blood Culture (routine x 2)     Status: Abnormal   Collection Time: 04/03/19  1:02 PM   Specimen: BLOOD  Result Value Ref Range Status   Specimen Description   Final    BLOOD RIGHT ANTECUBITAL Performed at [facility], [address], [place], [place] [address]    Special Requests   Final    BOTTLES DRAWN AEROBIC AND ANAEROBIC Blood Culture adequate volume Performed at [facility], [address] [place], [place] [address]    Culture  Setup Time   Final    IN BOTH AEROBIC AND ANAEROBIC BOTTLES GRAM POSITIVE COCCI CRITICAL RESULT CALLED TO, READ BACK BY AND VERIFIED WITH: DAVID BESANTI ON 04/04/19 AT 0320 [facility]    Culture ENTEROCOCCUS FAECALIS (A)  Final   Report Status 04/06/2019 FINAL  Final   Organism ID, Bacteria ENTEROCOCCUS FAECALIS  Final      Susceptibility   Enterococcus faecalis - MIC*    AMPICILLIN <=2 SENSITIVE Sensitive     VANCOMYCIN 1 SENSITIVE Sensitive     GENTAMICIN SYNERGY SENSITIVE Sensitive     * ENTEROCOCCUS FAECALIS  Blood Culture ID Panel (Reflexed)     Status: Abnormal   Collection Time: 04/03/19  1:02 PM  Result Value Ref Range Status   Enterococcus species DETECTED (A) NOT DETECTED Final    Comment: CRITICAL RESULT CALLED TO, READ BACK BY AND VERIFIED WITH:  SHEEMA HALLAJI AT [id number] 04/04/19 SNG/SDR    Vancomycin resistance NOT DETECTED NOT DETECTED Final   Listeria monocytogenes NOT DETECTED NOT DETECTED Final   Staphylococcus species NOT DETECTED NOT DETECTED Final   Staphylococcus aureus (BCID) NOT DETECTED NOT DETECTED Final   Streptococcus species NOT DETECTED NOT DETECTED Final   Streptococcus agalactiae NOT DETECTED NOT  DETECTED Final   Streptococcus pneumoniae NOT DETECTED NOT DETECTED Final   Streptococcus pyogenes NOT DETECTED NOT DETECTED Final   Acinetobacter baumannii NOT DETECTED NOT DETECTED Final   Enterobacteriaceae species NOT DETECTED NOT DETECTED Final   Enterobacter cloacae complex NOT DETECTED NOT DETECTED Final   Escherichia coli NOT DETECTED NOT DETECTED Final   Klebsiella oxytoca NOT DETECTED NOT DETECTED Final   Klebsiella pneumoniae NOT DETECTED NOT DETECTED Final   Proteus species NOT DETECTED NOT DETECTED Final   Serratia marcescens NOT DETECTED NOT DETECTED Final   Haemophilus influenzae NOT DETECTED NOT DETECTED Final   Neisseria meningitidis NOT DETECTED NOT DETECTED Final   Pseudomonas aeruginosa NOT DETECTED NOT DETECTED Final   Candida albicans NOT DETECTED NOT DETECTED Final   Candida glabrata NOT DETECTED NOT DETECTED Final   Candida krusei NOT DETECTED NOT DETECTED Final   Candida parapsilosis NOT DETECTED NOT DETECTED Final   Candida tropicalis NOT DETECTED NOT DETECTED Final    Comment: Performed at [facility], [address] [place], [place] [address]  Blood Culture (routine x 2)     Status: Abnormal   Collection Time: 04/03/19  1:08 PM   Specimen: BLOOD  Result Value Ref Range Status   Specimen Description   Final    BLOOD LEFT ANTECUBITAL  Performed at [facility], [address], [place], [place] [address]    Special Requests   Final    BOTTLES DRAWN AEROBIC AND ANAEROBIC Blood Culture adequate volume Performed at [facility], [address] [place], [place] [address]    Culture  Setup Time   Final    GRAM POSITIVE COCCI IN BOTH AEROBIC AND ANAEROBIC BOTTLES CRITICAL VALUE NOTED.  VALUE IS CONSISTENT WITH PREVIOUSLY REPORTED AND CALLED VALUE. Performed at [facility], [address], [place], [place] [address]    Culture (A)  Final    ENTEROCOCCUS FAECALIS SUSCEPTIBILITIES PERFORMED ON PREVIOUS CULTURE WITHIN  THE LAST 5 DAYS. Performed at [facility], [place] [address] [place], [place] [address]    Report Status 04/06/2019 FINAL  Final  Respiratory Panel by RT PCR (Flu A&B, Covid) - Nasopharyngeal Swab     Status: None   Collection Time: 04/03/19  1:08 PM   Specimen: Nasopharyngeal Swab  Result Value Ref Range Status   SARS Coronavirus 2 by RT PCR NEGATIVE NEGATIVE Final    Comment: (NOTE) SARS-CoV-2 target nucleic acids are NOT DETECTED. The SARS-CoV-2 RNA is generally detectable in upper respiratoy specimens during the acute phase of infection. The lowest concentration of SARS-CoV-2 viral copies this assay can detect is 131 copies/mL. A negative result does not preclude SARS-Cov-2 infection and should not be used as the sole basis for treatment or other patient management decisions. A negative result may occur with  improper specimen collection/handling, submission of specimen other than nasopharyngeal swab, presence of viral mutation(s) within the areas targeted by this assay, and inadequate number of viral copies (<131 copies/mL). A negative result must be combined with clinical observations, patient history, and epidemiological information. The expected result is Negative. Fact Sheet for Patients:  PinkCheek.be Fact Sheet for Healthcare Providers:  GravelBags.it This test is not yet ap proved or cleared by the [place] FDA and  has been authorized for detection and/or diagnosis of SARS-CoV-2 by FDA under an Emergency Use Authorization (EUA). This EUA will remain  in effect (meaning this test can be used) for the duration of the COVID-19 declaration under Section 564(b)(1) of the Act, 21 U.S.C. section 360bbb-3(b)(1), unless the authorization is terminated or revoked sooner.    Influenza A by PCR NEGATIVE NEGATIVE Final   Influenza B by PCR NEGATIVE NEGATIVE Final    Comment: (NOTE) The Xpert Xpress  SARS-CoV-2/FLU/RSV assay is intended as an aid in  the diagnosis of influenza from Nasopharyngeal swab specimens and  should not be used as a sole basis for treatment. Nasal washings and  aspirates are unacceptable for Xpert Xpress SARS-CoV-2/FLU/RSV  testing. Fact Sheet for Patients: PinkCheek.be Fact Sheet for Healthcare Providers: GravelBags.it This test is not yet approved or cleared by the [place] FDA and  has been authorized for detection and/or diagnosis of SARS-CoV-2 by  FDA under an Emergency Use Authorization (EUA). This EUA will remain  in effect (meaning this test can be used) for the duration of the  Covid-19 declaration under Section 564(b)(1) of the Act, 21  U.S.C. section 360bbb-3(b)(1), unless the authorization is  terminated or revoked. Performed at [facility], [address] [place], [place] [address]   MRSA PCR Screening     Status: None   Collection Time: 04/03/19  4:49 PM   Specimen: Nasal Mucosa; Nasopharyngeal  Result Value Ref Range Status   MRSA by PCR NEGATIVE NEGATIVE Final    Comment:  The GeneXpert MRSA Assay (FDA approved for NASAL specimens only), is one component of a comprehensive MRSA colonization surveillance program. It is not intended to diagnose MRSA infection nor to guide or monitor treatment for MRSA infections. Performed at [facility], [address] [place], [place] [address]   Urine culture     Status: None   Collection Time: 04/04/19  7:57 AM   Specimen: In/Out Cath Urine  Result Value Ref Range Status   Specimen Description   Final    IN/OUT CATH URINE Performed at [facility], [address], [place], [place] [address]    Special Requests   Final    NONE Performed at [facility], [address] [place], [place] [address]    Culture   Final    NO GROWTH Performed at [facility], [place] [address], [place], [place] [address]    Report Status 04/05/2019 FINAL  Final  Culture, blood (Routine X 2) w Reflex to ID Panel     Status: None   Collection Time: 04/06/19  2:02 PM   Specimen: BLOOD  Result Value Ref Range Status   Specimen Description BLOOD BLOOD LEFT HAND  Final   Special Requests   Final    BOTTLES DRAWN AEROBIC AND ANAEROBIC Blood Culture adequate volume   Culture   Final    NO GROWTH 5 DAYS Performed at [facility], [address] [place], [place] [address]    Report Status 04/11/2019 FINAL  Final  Aerobic Culture (superficial specimen)     Status: None   Collection Time: 04/09/19  7:44 PM   Specimen: Wound  Result Value Ref Range Status   Specimen Description   Final    WOUND Performed at [facility], [address] [place], [place] [address]    Special Requests   Final    NONE Performed at [facility], [address] [place], [place] [address]    Gram Stain   Final    RARE WBC PRESENT, PREDOMINANTLY PMN FEW YEAST Performed at [facility], [place] [address] [place],  [address]    Culture MODERATE CANDIDA GLABRATA FEW CANDIDA KRUSEI   Final   Report Status 04/12/2019 FINAL  Final     Labs: BNP (last 3 results) No results for input(s): BNP in the last 8760 hours. Basic Metabolic Panel: Recent Labs  Lab 04/06/19 0446 04/10/19 0454 04/11/19 0551 04/11/19 1904 04/12/19 0417  NA 141 137 140  --  139  K 3.8 2.1* <2.0* 3.7 3.5  CL 109 92* 92*  --  97*  CO2 23 32 34*  --  34*  GLUCOSE 168* 189* 214*  --  189*  BUN 39* 24* 21  --  20  CREATININE 1.00 0.97 0.87  --  0.92  CALCIUM 8.0* 7.7* 7.5*  --  7.5*  MG 2.6*  --  1.4*  --   --    Liver Function Tests: Recent Labs  Lab 04/06/19 0446  AST 25  ALT 18  ALKPHOS 78  BILITOT 0.4  PROT 6.0*  ALBUMIN 1.6*   No results for input(s): LIPASE, AMYLASE in the last 168 hours. No results for input(s): AMMONIA in the last 168 hours. CBC: Recent Labs  Lab  04/06/19 0446 04/10/19 0454 04/11/19 0551  WBC 13.2* 12.8* 10.2  HGB 8.2* 8.1* 8.0*  HCT 28.0* 25.8* 25.4*  MCV 87.5 83.0 83.3  PLT 277 275 287   Cardiac  Enzymes: No results for input(s): CKTOTAL, CKMB, CKMBINDEX, TROPONINI in the last 168 hours. BNP: Invalid input(s): POCBNP CBG: Recent Labs  Lab 04/11/19 1159 04/11/19 1658 04/11/19 2053 04/12/19 0745 04/12/19 1224  GLUCAP 176* 350* 285* 166* 161*   D-Dimer No results for input(s): DDIMER in the last 72 hours. Hgb A1c No results for input(s): HGBA1C in the last 72 hours. Lipid Profile No results for input(s): CHOL, HDL, LDLCALC, TRIG, CHOLHDL, LDLDIRECT in the last 72 hours. Thyroid function studies No results for input(s): TSH, T4TOTAL, T3FREE, THYROIDAB in the last 72 hours.  Invalid input(s): FREET3 Anemia work up No results for input(s): VITAMINB12, FOLATE, FERRITIN, TIBC, IRON, RETICCTPCT in the last 72 hours. Urinalysis    Component Value Date/Time   COLORURINE YELLOW (A) 04/04/2019 0757   APPEARANCEUR HAZY (A) 04/04/2019 0757   LABSPEC 1.017 04/04/2019 0757   PHURINE 5.0 04/04/2019 0757   GLUCOSEU NEGATIVE 04/04/2019 0757   HGBUR NEGATIVE 04/04/2019 0757   [place] NEGATIVE 04/04/2019 0757   KETONESUR NEGATIVE 04/04/2019 0757   PROTEINUR 30 (A) 04/04/2019 0757   NITRITE NEGATIVE 04/04/2019 0757   LEUKOCYTESUR NEGATIVE 04/04/2019 0757   Sepsis Labs Invalid input(s): PROCALCITONIN,  WBC,  LACTICIDVEN Microbiology Recent Results (from the past 240 hour(s))  Blood Culture (routine x 2)     Status: Abnormal   Collection Time: 04/03/19  1:02 PM   Specimen: BLOOD  Result Value Ref Range Status   Specimen Description   Final    BLOOD RIGHT ANTECUBITAL Performed at [facility], [address] [place], [place] [address]    Special Requests   Final    BOTTLES DRAWN AEROBIC AND ANAEROBIC Blood Culture adequate volume Performed at [facility], [address] [place], [place]  [address]    Culture  Setup Time   Final    IN BOTH AEROBIC AND ANAEROBIC BOTTLES GRAM POSITIVE COCCI CRITICAL RESULT CALLED TO, READ BACK BY AND VERIFIED WITH: DAVID BESANTI ON 04/04/19 AT 0320 [facility]    Culture ENTEROCOCCUS FAECALIS (A)  Final   Report Status 04/06/2019 FINAL  Final   Organism ID, Bacteria ENTEROCOCCUS FAECALIS  Final      Susceptibility   Enterococcus faecalis - MIC*    AMPICILLIN <=2 SENSITIVE Sensitive     VANCOMYCIN 1 SENSITIVE Sensitive     GENTAMICIN SYNERGY SENSITIVE Sensitive     * ENTEROCOCCUS FAECALIS  Blood Culture ID Panel (Reflexed)     Status: Abnormal   Collection Time: 04/03/19  1:02 PM  Result Value Ref Range Status   Enterococcus species DETECTED (A) NOT DETECTED Final    Comment: CRITICAL RESULT CALLED TO, READ BACK BY AND VERIFIED WITH:  SHEEMA HALLAJI AT [id number] 04/04/19 SNG/SDR    Vancomycin resistance NOT DETECTED NOT DETECTED Final   Listeria monocytogenes NOT DETECTED NOT DETECTED Final   Staphylococcus species NOT DETECTED NOT DETECTED Final   Staphylococcus aureus (BCID) NOT DETECTED NOT DETECTED Final   Streptococcus species NOT DETECTED NOT DETECTED Final   Streptococcus agalactiae NOT DETECTED NOT DETECTED Final   Streptococcus pneumoniae NOT DETECTED NOT DETECTED Final   Streptococcus pyogenes NOT DETECTED NOT DETECTED Final   Acinetobacter baumannii NOT DETECTED NOT DETECTED Final   Enterobacteriaceae species NOT DETECTED NOT DETECTED Final   Enterobacter cloacae complex NOT DETECTED NOT DETECTED Final   Escherichia coli NOT DETECTED NOT DETECTED Final   Klebsiella oxytoca NOT DETECTED NOT DETECTED Final   Klebsiella pneumoniae NOT DETECTED NOT DETECTED Final   Proteus species NOT  DETECTED NOT DETECTED Final   Serratia marcescens NOT DETECTED NOT DETECTED Final   Haemophilus influenzae NOT DETECTED NOT DETECTED Final   Neisseria meningitidis NOT DETECTED NOT DETECTED Final   Pseudomonas aeruginosa NOT DETECTED NOT DETECTED Final    Candida albicans NOT DETECTED NOT DETECTED Final   Candida glabrata NOT DETECTED NOT DETECTED Final   Candida krusei NOT DETECTED NOT DETECTED Final   Candida parapsilosis NOT DETECTED NOT DETECTED Final   Candida tropicalis NOT DETECTED NOT DETECTED Final    Comment: Performed at [facility], [address] [place], [place] [address]  Blood Culture (routine x 2)     Status: Abnormal   Collection Time: 04/03/19  1:08 PM   Specimen: BLOOD  Result Value Ref Range Status   Specimen Description   Final    BLOOD LEFT ANTECUBITAL Performed at [facility], [address] [place], [place] [address]    Special Requests   Final    BOTTLES DRAWN AEROBIC AND ANAEROBIC Blood Culture adequate volume Performed at [facility], [address] [place], [place] [address]    Culture  Setup Time   Final    GRAM POSITIVE COCCI IN BOTH AEROBIC AND ANAEROBIC BOTTLES CRITICAL VALUE NOTED.  VALUE IS CONSISTENT WITH PREVIOUSLY REPORTED AND CALLED VALUE. Performed at [facility], [address], [place], [place] [address]    Culture (A)  Final    ENTEROCOCCUS FAECALIS SUSCEPTIBILITIES PERFORMED ON PREVIOUS CULTURE WITHIN THE LAST 5 DAYS. Performed at [facility], [place] [address] [place], [place] [address]    Report Status 04/06/2019 FINAL  Final  Respiratory Panel by RT PCR (Flu A&B, Covid) - Nasopharyngeal Swab     Status: None   Collection Time: 04/03/19  1:08 PM   Specimen: Nasopharyngeal Swab  Result Value Ref Range Status   SARS Coronavirus 2 by RT PCR NEGATIVE NEGATIVE Final    Comment: (NOTE) SARS-CoV-2 target nucleic acids are NOT DETECTED. The SARS-CoV-2 RNA is generally detectable in upper respiratoy specimens during the acute phase of infection. The lowest concentration of SARS-CoV-2 viral copies this assay can detect is 131 copies/mL. A negative result does not preclude SARS-Cov-2 infection and should not be used as the sole basis for  treatment or other patient management decisions. A negative result may occur with  improper specimen collection/handling, submission of specimen other than nasopharyngeal swab, presence of viral mutation(s) within the areas targeted by this assay, and inadequate number of viral copies (<131 copies/mL). A negative result must be combined with clinical observations, patient history, and epidemiological information. The expected result is Negative. Fact Sheet for Patients:  PinkCheek.be Fact Sheet for Healthcare Providers:  GravelBags.it This test is not yet ap proved or cleared by the [place] FDA and  has been authorized for detection and/or diagnosis of SARS-CoV-2 by FDA under an Emergency Use Authorization (EUA). This EUA will remain  in effect (meaning this test can be used) for the duration of the COVID-19 declaration under Section 564(b)(1) of the Act, 21 U.S.C. section 360bbb-3(b)(1), unless the authorization is terminated or revoked sooner.    Influenza A by PCR NEGATIVE NEGATIVE Final   Influenza B by PCR NEGATIVE NEGATIVE Final    Comment: (NOTE) The Xpert Xpress SARS-CoV-2/FLU/RSV assay is intended as an aid in  the diagnosis of influenza from Nasopharyngeal swab specimens and  should not be used as a sole basis for treatment. Nasal washings and  aspirates are unacceptable for Xpert Xpress  SARS-CoV-2/FLU/RSV  testing. Fact Sheet for Patients: PinkCheek.be Fact Sheet for Healthcare Providers: GravelBags.it This test is not yet approved or cleared by the [place] FDA and  has been authorized for detection and/or diagnosis of SARS-CoV-2 by  FDA under an Emergency Use Authorization (EUA). This EUA will remain  in effect (meaning this test can be used) for the duration of the  Covid-19 declaration under Section 564(b)(1) of the Act, 21  U.S.C. section  360bbb-3(b)(1), unless the authorization is  terminated or revoked. Performed at [facility], [address] [place], [place] [address]   MRSA PCR Screening     Status: None   Collection Time: 04/03/19  4:49 PM   Specimen: Nasal Mucosa; Nasopharyngeal  Result Value Ref Range Status   MRSA by PCR NEGATIVE NEGATIVE Final    Comment:        The GeneXpert MRSA Assay (FDA approved for NASAL specimens only), is one component of a comprehensive MRSA colonization surveillance program. It is not intended to diagnose MRSA infection nor to guide or monitor treatment for MRSA infections. Performed at [facility], [address] [place], [place] [address]   Urine culture     Status: None   Collection Time: 04/04/19  7:57 AM   Specimen: In/Out Cath Urine  Result Value Ref Range Status   Specimen Description   Final    IN/OUT CATH URINE Performed at [facility], [address], [place], [place] [address]    Special Requests   Final    NONE Performed at [facility], [address] [place], [place] [address]    Culture   Final    NO GROWTH Performed at [facility], [place] [address], [place], [place] [address]    Report Status 04/05/2019 FINAL  Final  Culture, blood (Routine X 2) w Reflex to ID Panel     Status: None   Collection Time: 04/06/19  2:02 PM   Specimen: BLOOD  Result Value Ref Range Status   Specimen Description BLOOD BLOOD LEFT HAND  Final   Special Requests   Final    BOTTLES DRAWN AEROBIC AND ANAEROBIC Blood Culture adequate volume   Culture   Final    NO GROWTH 5 DAYS Performed at [facility], [address] [place], [place] [address]    Report Status 04/11/2019 FINAL  Final  Aerobic Culture (superficial specimen)     Status: None   Collection Time: 04/09/19  7:44 PM   Specimen: Wound  Result Value Ref Range Status   Specimen Description   Final    WOUND Performed at [facility], [address] [place], [place] [address]    Special Requests   Final    NONE Performed at [facility], [address] [place], [place] [address]    Gram Stain   Final    RARE WBC PRESENT, PREDOMINANTLY PMN FEW YEAST Performed at [facility], [place] [address], [place], [place] [address]    Culture MODERATE CANDIDA GLABRATA FEW CANDIDA KRUSEI   Final   Report Status 04/12/2019 FINAL  Final     Time coordinating discharge: Over 30 minutes  SIGNED:   Nolberto Hanlon, MD  [facility] 04/12/2019, 2:35 PM Pager   If 7PM-7AM, please contact night-coverage [email or web address] Password [email or web address]

## 2019-04-19 ENCOUNTER — Telehealth: Payer: Self-pay

## 2019-04-19 NOTE — Telephone Encounter (Signed)
Spoke to [employer] at [facility] and they have orders to remove PICC after last dose/ [facility]

## 2019-04-23 ENCOUNTER — Encounter: Payer: Self-pay | Admitting: Emergency Medicine

## 2019-04-23 ENCOUNTER — Inpatient Hospital Stay
Admission: EM | Admit: 2019-04-23 | Discharge: 2019-04-27 | DRG: 853 | Disposition: A | Discharge status: Home Health | Payer: Medicare Other | Attending: Internal Medicine | Admitting: Internal Medicine

## 2019-04-23 ENCOUNTER — Other Ambulatory Visit: Payer: Self-pay

## 2019-04-23 ENCOUNTER — Emergency Department: Payer: Medicare Other

## 2019-04-23 DIAGNOSIS — N179 Acute kidney failure, unspecified: Secondary | ICD-10-CM | POA: Diagnosis present

## 2019-04-23 DIAGNOSIS — N1831 Chronic kidney disease, stage 3a: Secondary | ICD-10-CM | POA: Diagnosis not present

## 2019-04-23 DIAGNOSIS — E875 Hyperkalemia: Secondary | ICD-10-CM | POA: Diagnosis present

## 2019-04-23 DIAGNOSIS — D251 Intramural leiomyoma of uterus: Secondary | ICD-10-CM | POA: Diagnosis present

## 2019-04-23 DIAGNOSIS — F419 Anxiety disorder, unspecified: Secondary | ICD-10-CM | POA: Diagnosis present

## 2019-04-23 DIAGNOSIS — A419 Sepsis, unspecified organism: Secondary | ICD-10-CM | POA: Diagnosis present

## 2019-04-23 DIAGNOSIS — K219 Gastro-esophageal reflux disease without esophagitis: Secondary | ICD-10-CM | POA: Diagnosis present

## 2019-04-23 DIAGNOSIS — M1612 Unilateral primary osteoarthritis, left hip: Secondary | ICD-10-CM | POA: Diagnosis present

## 2019-04-23 DIAGNOSIS — E872 Acidosis: Secondary | ICD-10-CM | POA: Diagnosis present

## 2019-04-23 DIAGNOSIS — Z66 Do not resuscitate: Secondary | ICD-10-CM | POA: Diagnosis present

## 2019-04-23 DIAGNOSIS — E876 Hypokalemia: Secondary | ICD-10-CM | POA: Diagnosis present

## 2019-04-23 DIAGNOSIS — L89153 Pressure ulcer of sacral region, stage 3: Secondary | ICD-10-CM | POA: Diagnosis not present

## 2019-04-23 DIAGNOSIS — F32A Depression, unspecified: Secondary | ICD-10-CM | POA: Diagnosis present

## 2019-04-23 DIAGNOSIS — Z20822 Contact with and (suspected) exposure to covid-19: Secondary | ICD-10-CM | POA: Diagnosis present

## 2019-04-23 DIAGNOSIS — F0390 Unspecified dementia without behavioral disturbance: Secondary | ICD-10-CM | POA: Diagnosis present

## 2019-04-23 DIAGNOSIS — Z79899 Other long term (current) drug therapy: Secondary | ICD-10-CM

## 2019-04-23 DIAGNOSIS — R000 Tachycardia, unspecified: Secondary | ICD-10-CM | POA: Diagnosis not present

## 2019-04-23 DIAGNOSIS — R7881 Bacteremia: Secondary | ICD-10-CM

## 2019-04-23 DIAGNOSIS — E1122 Type 2 diabetes mellitus with diabetic chronic kidney disease: Secondary | ICD-10-CM | POA: Diagnosis present

## 2019-04-23 DIAGNOSIS — I639 Cerebral infarction, unspecified: Secondary | ICD-10-CM | POA: Diagnosis present

## 2019-04-23 DIAGNOSIS — L8990 Pressure ulcer of unspecified site, unspecified stage: Secondary | ICD-10-CM

## 2019-04-23 DIAGNOSIS — R5381 Other malaise: Secondary | ICD-10-CM

## 2019-04-23 DIAGNOSIS — L89216 Pressure-induced deep tissue damage of right hip: Secondary | ICD-10-CM | POA: Diagnosis present

## 2019-04-23 DIAGNOSIS — N888 Other specified noninflammatory disorders of cervix uteri: Secondary | ICD-10-CM | POA: Diagnosis present

## 2019-04-23 DIAGNOSIS — Z800 Family history of malignant neoplasm of digestive organs: Secondary | ICD-10-CM

## 2019-04-23 DIAGNOSIS — R197 Diarrhea, unspecified: Secondary | ICD-10-CM | POA: Diagnosis present

## 2019-04-23 DIAGNOSIS — L89154 Pressure ulcer of sacral region, stage 4: Secondary | ICD-10-CM | POA: Diagnosis present

## 2019-04-23 DIAGNOSIS — E860 Dehydration: Secondary | ICD-10-CM | POA: Diagnosis present

## 2019-04-23 DIAGNOSIS — E440 Moderate protein-calorie malnutrition: Secondary | ICD-10-CM | POA: Diagnosis present

## 2019-04-23 DIAGNOSIS — Z7984 Long term (current) use of oral hypoglycemic drugs: Secondary | ICD-10-CM

## 2019-04-23 DIAGNOSIS — J449 Chronic obstructive pulmonary disease, unspecified: Secondary | ICD-10-CM | POA: Diagnosis present

## 2019-04-23 DIAGNOSIS — Z7189 Other specified counseling: Secondary | ICD-10-CM

## 2019-04-23 DIAGNOSIS — N183 Chronic kidney disease, stage 3 unspecified: Secondary | ICD-10-CM | POA: Diagnosis present

## 2019-04-23 DIAGNOSIS — D649 Anemia, unspecified: Secondary | ICD-10-CM | POA: Diagnosis present

## 2019-04-23 DIAGNOSIS — N1832 Chronic kidney disease, stage 3b: Secondary | ICD-10-CM | POA: Diagnosis present

## 2019-04-23 DIAGNOSIS — Z8249 Family history of ischemic heart disease and other diseases of the circulatory system: Secondary | ICD-10-CM

## 2019-04-23 DIAGNOSIS — E785 Hyperlipidemia, unspecified: Secondary | ICD-10-CM | POA: Diagnosis present

## 2019-04-23 DIAGNOSIS — Z7401 Bed confinement status: Secondary | ICD-10-CM

## 2019-04-23 DIAGNOSIS — F17210 Nicotine dependence, cigarettes, uncomplicated: Secondary | ICD-10-CM | POA: Diagnosis present

## 2019-04-23 DIAGNOSIS — Z7982 Long term (current) use of aspirin: Secondary | ICD-10-CM

## 2019-04-23 DIAGNOSIS — I050 Rheumatic mitral stenosis: Secondary | ICD-10-CM

## 2019-04-23 DIAGNOSIS — F319 Bipolar disorder, unspecified: Secondary | ICD-10-CM | POA: Diagnosis present

## 2019-04-23 DIAGNOSIS — R531 Weakness: Secondary | ICD-10-CM

## 2019-04-23 DIAGNOSIS — F329 Major depressive disorder, single episode, unspecified: Secondary | ICD-10-CM | POA: Diagnosis present

## 2019-04-23 DIAGNOSIS — Z8673 Personal history of transient ischemic attack (TIA), and cerebral infarction without residual deficits: Secondary | ICD-10-CM

## 2019-04-23 DIAGNOSIS — M609 Myositis, unspecified: Secondary | ICD-10-CM | POA: Diagnosis present

## 2019-04-23 LAB — CBC WITH DIFFERENTIAL/PLATELET
Abs Immature Granulocytes: 0.07 10*3/uL (ref 0.00–0.07)
Basophils Absolute: 0 10*3/uL (ref 0.0–0.1)
Basophils Relative: 0 %
Eosinophils Absolute: 0.1 10*3/uL (ref 0.0–0.5)
Eosinophils Relative: 1 %
HCT: 26.1 % — ABNORMAL LOW (ref 36.0–46.0)
Hemoglobin: 7.8 g/dL — ABNORMAL LOW (ref 12.0–15.0)
Immature Granulocytes: 1 %
Lymphocytes Relative: 11 %
Lymphs Abs: 1.1 10*3/uL (ref 0.7–4.0)
MCH: 27.6 pg (ref 26.0–34.0)
MCHC: 29.9 g/dL — ABNORMAL LOW (ref 30.0–36.0)
MCV: 92.2 fL (ref 80.0–100.0)
Monocytes Absolute: 0.5 10*3/uL (ref 0.1–1.0)
Monocytes Relative: 5 %
Neutro Abs: 8.6 10*3/uL — ABNORMAL HIGH (ref 1.7–7.7)
Neutrophils Relative %: 82 %
Platelets: 246 10*3/uL (ref 150–400)
RBC: 2.83 MIL/uL — ABNORMAL LOW (ref 3.87–5.11)
RDW: 23.4 % — ABNORMAL HIGH (ref 11.5–15.5)
Smear Review: ADEQUATE
WBC: 10.7 10*3/uL — ABNORMAL HIGH (ref 4.0–10.5)
nRBC: 0 % (ref 0.0–0.2)

## 2019-04-23 LAB — COMPREHENSIVE METABOLIC PANEL
ALT: 16 U/L (ref 0–44)
AST: 23 U/L (ref 15–41)
Albumin: 1.9 g/dL — ABNORMAL LOW (ref 3.5–5.0)
Alkaline Phosphatase: 117 U/L (ref 38–126)
Anion gap: 10 (ref 5–15)
BUN: 23 mg/dL (ref 8–23)
CO2: 25 mmol/L (ref 22–32)
Calcium: 8 mg/dL — ABNORMAL LOW (ref 8.9–10.3)
Chloride: 106 mmol/L (ref 98–111)
Creatinine, Ser: 1.39 mg/dL — ABNORMAL HIGH (ref 0.44–1.00)
GFR calc Af Amer: 43 mL/min — ABNORMAL LOW (ref >60)
GFR calc non Af Amer: 37 mL/min — ABNORMAL LOW (ref >60)
Glucose, Bld: 100 mg/dL — ABNORMAL HIGH (ref 70–99)
Potassium: 6.1 mmol/L — ABNORMAL HIGH (ref 3.5–5.1)
Sodium: 141 mmol/L (ref 135–145)
Total Bilirubin: 0.6 mg/dL (ref 0.3–1.2)
Total Protein: 6 g/dL — ABNORMAL LOW (ref 6.5–8.1)

## 2019-04-23 LAB — POC SARS CORONAVIRUS 2 AG: SARS Coronavirus 2 Ag: NEGATIVE

## 2019-04-23 LAB — GLUCOSE, CAPILLARY
Glucose-Capillary: 84 mg/dL (ref 70–99)
Glucose-Capillary: 98 mg/dL (ref 70–99)

## 2019-04-23 LAB — LACTIC ACID, PLASMA: Lactic Acid, Venous: 2.6 mmol/L (ref 0.5–1.9)

## 2019-04-23 LAB — PATHOLOGIST SMEAR REVIEW

## 2019-04-23 IMAGING — DX DG CHEST 1V PORT
1 series · 1 of 1 positions shown · non-contrast
Comparison: [DATE]

CLINICAL DATA: Pt to ED by [facility] due to family concerns over wound.
Pt recently hospitalized for sepsis and discharged to [REDACTED]. Pt sent home on Sat after antibiotic therapy at [facility].
Pt's daughter also reports pt had diarrhea over the .*comment was
truncated*fever, ams, eval for infiltrate

EXAM:
PORTABLE CHEST 1 VIEW

[chest ap]
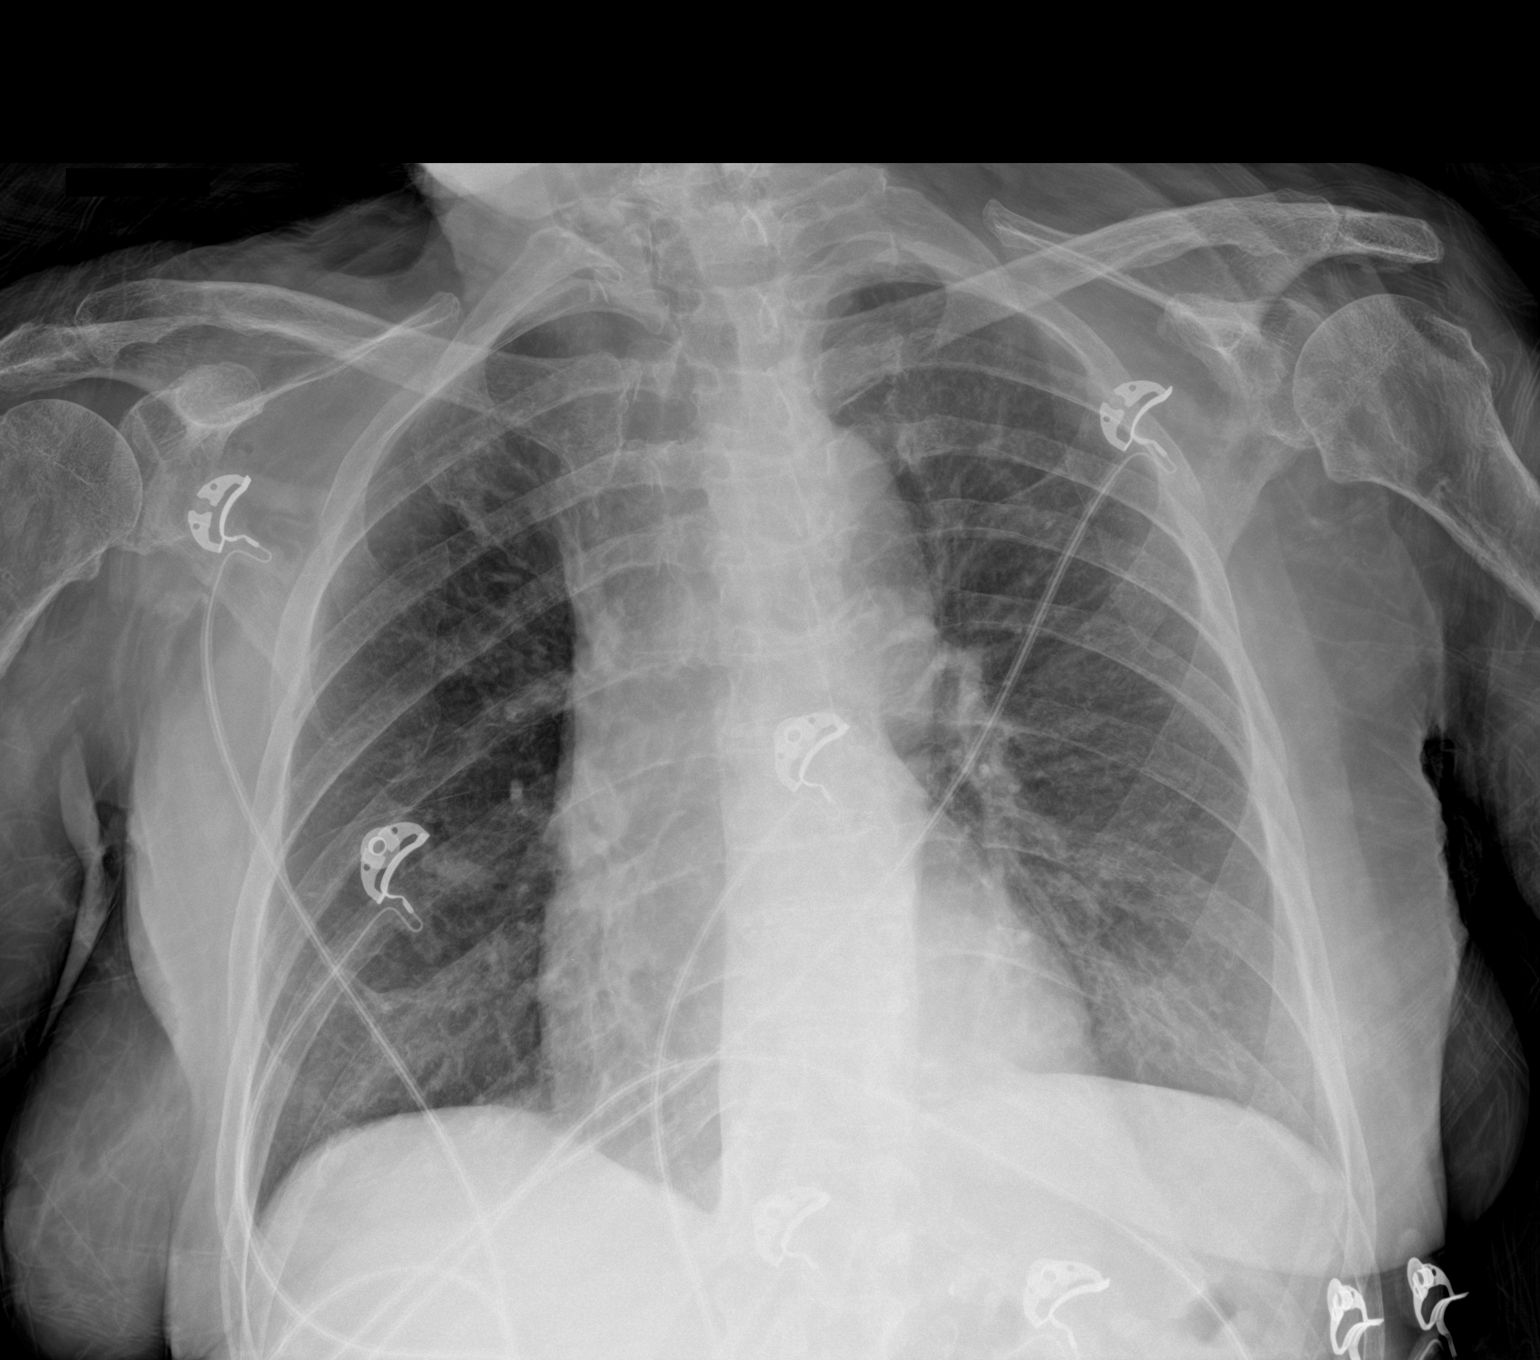

[1 of 1 positions shown; findings below may reference images not displayed]

FINDINGS: Normal cardiac silhouette. Mild increased airspace disease lung
bases. No focal infiltrate. No pleural fluid. No pneumothorax.
IMPRESSION: Subtle bibasilar airspace disease could represent pulmonary edema or
viral pneumonia.

## 2019-04-23 MED ORDER — LIDOCAINE HCL (PF) 1 % IJ SOLN
5.0000 mL | Freq: Once | INTRAMUSCULAR | Status: AC
  Administered 2019-04-23: 5 mL via INTRADERMAL

## 2019-04-23 MED ORDER — COLLAGENASE 250 UNIT/GM EX OINT
TOPICAL_OINTMENT | Freq: Two times a day (BID) | CUTANEOUS | Status: DC
  Filled 2019-04-23: qty 30

## 2019-04-23 MED ORDER — ATORVASTATIN CALCIUM 20 MG PO TABS
40.0000 mg | ORAL_TABLET | Freq: Every day | ORAL | Status: DC
  Administered 2019-04-23 – 2019-04-26 (×4): 40 mg via ORAL
  Filled 2019-04-23 (×4): qty 2

## 2019-04-23 MED ORDER — PANTOPRAZOLE SODIUM 40 MG PO TBEC
40.0000 mg | DELAYED_RELEASE_TABLET | Freq: Every day | ORAL | Status: DC
  Administered 2019-04-24 – 2019-04-27 (×4): 40 mg via ORAL
  Filled 2019-04-23 (×4): qty 1

## 2019-04-23 MED ORDER — INSULIN ASPART 100 UNIT/ML ~~LOC~~ SOLN
0.0000 [IU] | Freq: Three times a day (TID) | SUBCUTANEOUS | Status: DC
  Administered 2019-04-25: 2 [IU] via SUBCUTANEOUS
  Administered 2019-04-25: 3 [IU] via SUBCUTANEOUS
  Administered 2019-04-27: 2 [IU] via SUBCUTANEOUS
  Filled 2019-04-23 (×3): qty 1

## 2019-04-23 MED ORDER — RISAQUAD PO CAPS
1.0000 | ORAL_CAPSULE | Freq: Three times a day (TID) | ORAL | Status: DC
  Administered 2019-04-23 – 2019-04-27 (×12): 1 via ORAL
  Filled 2019-04-23 (×12): qty 1

## 2019-04-23 MED ORDER — SODIUM ZIRCONIUM CYCLOSILICATE 10 G PO PACK
10.0000 g | PACK | Freq: Every day | ORAL | Status: AC
  Administered 2019-04-24 – 2019-04-25 (×2): 10 g via ORAL
  Filled 2019-04-23 (×2): qty 1

## 2019-04-23 MED ORDER — INSULIN ASPART 100 UNIT/ML ~~LOC~~ SOLN: 0.0000 [IU] | Freq: Every day | SUBCUTANEOUS | Status: DC

## 2019-04-23 MED ORDER — ONDANSETRON HCL 4 MG/2ML IJ SOLN: 4.0000 mg | Freq: Four times a day (QID) | INTRAMUSCULAR | Status: DC | PRN

## 2019-04-23 MED ORDER — DEXTROSE 50 % IV SOLN
1.0000 | Freq: Once | INTRAVENOUS | Status: AC
  Administered 2019-04-23: 50 mL via INTRAVENOUS
  Filled 2019-04-23: qty 50

## 2019-04-23 MED ORDER — SODIUM CHLORIDE 0.9 % IV BOLUS
250.0000 mL | Freq: Once | INTRAVENOUS | Status: AC
  Administered 2019-04-23: 250 mL via INTRAVENOUS

## 2019-04-23 MED ORDER — ACETAMINOPHEN 650 MG RE SUPP: 650.0000 mg | Freq: Four times a day (QID) | RECTAL | Status: DC | PRN

## 2019-04-23 MED ORDER — CALCIUM GLUCONATE 10 % IV SOLN
1.0000 g | Freq: Once | INTRAVENOUS | Status: AC
  Administered 2019-04-23: 1 g via INTRAVENOUS
  Filled 2019-04-23: qty 10

## 2019-04-23 MED ORDER — ONDANSETRON HCL 4 MG PO TABS: 4.0000 mg | ORAL_TABLET | Freq: Four times a day (QID) | ORAL | Status: DC | PRN

## 2019-04-23 MED ORDER — SODIUM CHLORIDE 0.9 % IV SOLN: INTRAVENOUS | Status: DC

## 2019-04-23 MED ORDER — ASPIRIN EC 81 MG PO TBEC
81.0000 mg | DELAYED_RELEASE_TABLET | Freq: Every day | ORAL | Status: DC
  Administered 2019-04-24: 81 mg via ORAL
  Filled 2019-04-23: qty 1

## 2019-04-23 MED ORDER — NICOTINE 21 MG/24HR TD PT24
21.0000 mg | MEDICATED_PATCH | Freq: Every day | TRANSDERMAL | Status: DC
  Administered 2019-04-23 – 2019-04-26 (×2): 21 mg via TRANSDERMAL
  Filled 2019-04-23 (×4): qty 1

## 2019-04-23 MED ORDER — ALBUTEROL SULFATE (2.5 MG/3ML) 0.083% IN NEBU: 2.5000 mg | INHALATION_SOLUTION | RESPIRATORY_TRACT | Status: DC | PRN

## 2019-04-23 MED ORDER — INSULIN ASPART 100 UNIT/ML ~~LOC~~ SOLN
4.0000 [IU] | Freq: Once | SUBCUTANEOUS | Status: AC
  Administered 2019-04-23: 4 [IU] via INTRAVENOUS
  Filled 2019-04-23: qty 1

## 2019-04-23 MED ORDER — ENOXAPARIN SODIUM 30 MG/0.3ML ~~LOC~~ SOLN
30.0000 mg | SUBCUTANEOUS | Status: DC
  Administered 2019-04-23: 30 mg via SUBCUTANEOUS
  Filled 2019-04-23: qty 0.3

## 2019-04-23 MED ORDER — ACETAMINOPHEN 325 MG PO TABS
650.0000 mg | ORAL_TABLET | Freq: Four times a day (QID) | ORAL | Status: DC | PRN
  Administered 2019-04-25: 650 mg via ORAL
  Filled 2019-04-23: qty 2

## 2019-04-23 MED ORDER — ADULT MULTIVITAMIN W/MINERALS CH
1.0000 | ORAL_TABLET | Freq: Every day | ORAL | Status: DC
  Administered 2019-04-24 – 2019-04-27 (×4): 1 via ORAL
  Filled 2019-04-23 (×5): qty 1

## 2019-04-23 MED ORDER — SODIUM CHLORIDE 0.9 % IV SOLN: Freq: Once | INTRAVENOUS | Status: AC

## 2019-04-23 MED ORDER — SODIUM CHLORIDE 0.9 % IV BOLUS
1000.0000 mL | Freq: Once | INTRAVENOUS | Status: AC
  Administered 2019-04-23: 1000 mL via INTRAVENOUS

## 2019-04-23 MED ORDER — SODIUM ZIRCONIUM CYCLOSILICATE 10 G PO PACK
10.0000 g | PACK | Freq: Every day | ORAL | Status: DC
  Administered 2019-04-23: 10 g via ORAL
  Filled 2019-04-23: qty 1

## 2019-04-23 MED ORDER — BUSPIRONE HCL 10 MG PO TABS
10.0000 mg | ORAL_TABLET | Freq: Two times a day (BID) | ORAL | Status: DC
  Administered 2019-04-23 – 2019-04-27 (×8): 10 mg via ORAL
  Filled 2019-04-23 (×9): qty 1

## 2019-04-23 MED ORDER — ASCORBIC ACID 500 MG PO TABS
500.0000 mg | ORAL_TABLET | Freq: Two times a day (BID) | ORAL | Status: DC
  Administered 2019-04-23 – 2019-04-27 (×8): 500 mg via ORAL
  Filled 2019-04-23 (×8): qty 1

## 2019-04-23 MED ORDER — POLYVINYL ALCOHOL 1.4 % OP SOLN
1.0000 [drp] | Freq: Four times a day (QID) | OPHTHALMIC | Status: DC | PRN
  Filled 2019-04-23: qty 15

## 2019-04-23 MED ORDER — SODIUM CHLORIDE 0.9 % IV SOLN
1.0000 g | INTRAVENOUS | Status: DC
  Administered 2019-04-23 – 2019-04-25 (×3): 1 g via INTRAVENOUS
  Filled 2019-04-23: qty 1
  Filled 2019-04-23 (×2): qty 10
  Filled 2019-04-23: qty 1

## 2019-04-23 MED ORDER — HYDRALAZINE HCL 20 MG/ML IJ SOLN: 5.0000 mg | INTRAMUSCULAR | Status: DC | PRN

## 2019-04-23 NOTE — ED Triage Notes (Signed)
Pt to ED by [facility] due to family concerns over wound. Pt recently hospitalized for sepsis and discharged to [facility]. Pt sent home on Sat after antibiotic therapy at [facility]. Pt's daughter also reports pt had diarrhea over the weekend.

## 2019-04-23 NOTE — ED Provider Notes (Addendum)
[facility] Emergency Department Provider Note    First MD Initiated Contact with Patient 04/23/19 1159     (approximate)  I have reviewed the triage vital signs and the nursing notes.   HISTORY  Chief Complaint Wound Check  Level V Caveat:  AMS  HPI Miranda Ryan is a 74 y.o. female with the below listed past medical history with recent mission the hospital for septic shock presents to the ER for evaluation of several episodes of loose stool over the weekend as well as fever reported at home.  Patient not febrile on arrival.  Patient unable to provide much history.  Denies that she is in any pain.  Has reportedly completed her course of antibiotics for sepsis.  Was previously at [facility] and was subsequently discharged home over the weekend.    Past Medical History:  Diagnosis Date  . Acute CVA (cerebrovascular accident) ([place]) 02/22/2019  . Acute exacerbation of chronic obstructive pulmonary disease (COPD) ([place]) 04/20/2016  . Anxiety   . Bipolar disorder ([place])   . Dementia ([place])   . Depression   . Diabetes mellitus, type II ([place])   . Dysphagia 02/23/2019  . Incontinence of urine   . Low serum thyroid stimulating hormone (TSH) 02/23/2019  . Severe mitral valve stenosis 02/23/2019   Family History  Problem Relation Age of Onset  . Liver cancer Mother   . Heart attack Father   . Heart disease Sister   . Heart disease Brother   . Heart disease Brother   . Heart disease Brother    Past Surgical History:  Procedure Laterality Date  . CERVIX SURGERY     Patient Active Problem List   Diagnosis Date Noted  . Hyperkalemia 04/23/2019  . Stroke ([place]) 04/23/2019  . HLD (hyperlipidemia) 04/23/2019  . COPD (chronic obstructive pulmonary disease) ([place]) 04/23/2019  . GERD (gastroesophageal reflux disease) 04/23/2019  . Depression 04/23/2019  . CKD (chronic kidney disease), stage IIIa 04/23/2019  . Diarrhea 04/23/2019  . Sacral decubitus ulcer, stage  III ([employer]) 04/23/2019  . Normocytic anemia 04/23/2019  . Malnutrition of moderate degree 04/05/2019  . Goals of care, counseling/discussion   . Palliative care by specialist   . Encounter for hospice care discussion   . Pressure injury of skin 04/04/2019  . Bacteremia   . Septic shock ([place]) 04/03/2019  . Dysphagia 02/23/2019  . Low serum thyroid stimulating hormone (TSH) 02/23/2019  . Severe mitral valve stenosis 02/23/2019  . Acute CVA (cerebrovascular accident) ([place]) 02/22/2019  . Generalized weakness   . Suspected UTI   . Ischemic stroke diagnosed during current admission ([place]) 02/21/2019  . Acute exacerbation of chronic obstructive pulmonary disease (COPD) ([place]) 04/20/2016      Prior to Admission medications   Medication Sig Start Date End Date Taking? Authorizing Provider  acetaminophen (TYLENOL) 325 MG tablet Take 2 tablets (650 mg total) by mouth every 6 (six) hours as needed for mild pain (or Fever >/= 101). 04/12/19   Nolberto Hanlon, MD  acidophilus (RISAQUAD) CAPS capsule Take 1 capsule by mouth 3 (three) times daily. 04/12/19   Nolberto Hanlon, MD  ascorbic acid (VITAMIN C) 500 MG tablet Take 1 tablet (500 mg total) by mouth 2 (two) times daily. 04/12/19   Nolberto Hanlon, MD  aspirin EC 81 MG EC tablet Take 1 tablet (81 mg total) by mouth daily. 02/25/19   Samuella Cota, MD  atorvastatin (LIPITOR) 40 MG tablet Take 1 tablet (40 mg total) by  mouth daily at 6 PM. 02/24/19   Samuella Cota, MD  busPIRone (BUSPAR) 10 MG tablet Take 10 mg by mouth 2 (two) times daily.    [provider]  collagenase (SANTYL) ointment Apply topically 2 (two) times daily. 04/12/19   Nolberto Hanlon, MD  feeding supplement, ENSURE ENLIVE, (ENSURE ENLIVE) LIQD Take 237 mLs by mouth 2 (two) times daily between meals. 02/24/19   Samuella Cota, MD  linagliptin (TRADJENTA) 5 MG TABS tablet Take 5 mg by mouth daily.    [provider]  metFORMIN (GLUCOPHAGE) 500 MG tablet Take 1 tablet (500 mg  total) by mouth 2 (two) times daily with a meal. 02/24/19 02/24/20  Samuella Cota, MD  Multiple Vitamin (MULTIVITAMIN WITH MINERALS) TABS tablet Take 1 tablet by mouth daily. 04/13/19   Nolberto Hanlon, MD  pantoprazole (PROTONIX) 40 MG tablet Take 1 tablet (40 mg total) by mouth daily. 04/13/19   Nolberto Hanlon, MD  polyvinyl alcohol (LIQUIFILM TEARS) 1.4 % ophthalmic solution Place 1 drop into both eyes 4 (four) times daily as needed for dry eyes. 04/12/19   Nolberto Hanlon, MD  potassium chloride SA (KLOR-CON) 20 MEQ tablet Take 2 tablets (40 mEq total) by mouth daily for 1 day. 04/13/19 04/14/19  Nolberto Hanlon, MD  silver nitrate applicators [id number] % applicator Apply 10 Sticks topically as needed for bleeding. 04/12/19   Nolberto Hanlon, MD    Allergies Demerol [meperidine]    Social History Social History   Tobacco Use  . Smoking status: Current Every Day Smoker    Packs/day: 1.00    Years: 46.00    Pack years: 46.00    Types: Cigarettes    Start date: 09/16/1964  . Smokeless tobacco: Never Used  Substance Use Topics  . Alcohol use: Not Currently    Alcohol/week: 0.0 standard drinks    Comment: occasionally  . Drug use: No    Review of Systems Patient denies headaches, rhinorrhea, blurry vision, numbness, shortness of breath, chest pain, edema, cough, abdominal pain, nausea, vomiting, diarrhea, dysuria, fevers, rashes or hallucinations unless otherwise stated above in HPI. ____________________________________________   PHYSICAL EXAM:  VITAL SIGNS: Vitals:   04/23/19 1400 04/23/19 1512  BP: (!) 153/73 (!) 147/68  Pulse: 86 98  Resp: (!) 24 16  Temp:    SpO2: 100% 97%    Constitutional: Alert, chronically ill appearing Eyes: Conjunctivae are normal.  Head: Atraumatic. Nose: No congestion/rhinnorhea. Mouth/Throat: Mucous membranes are moist.   Neck: No stridor. Painless ROM.  Cardiovascular: Normal rate, regular rhythm. Grossly normal heart sounds.  Good peripheral  circulation. Respiratory: Normal respiratory effort.  No retractions. Lungs CTAB. Gastrointestinal: Soft and nontender. No distention. No abdominal bruits. No CVA tenderness. Genitourinary: normal external genitalia, stageIII sacral decubitus ulcer, foul smelling, no surrounding erythema Musculoskeletal: No lower extremity tenderness nor edema.  No joint effusions. Neurologic:   No gross focal neurologic deficits are appreciated. No facial droop Skin:  Skin is warm, dry and intact. No rash noted. Psychiatric: calm and cooperative  ____________________________________________   LABS (all labs ordered are listed, but only abnormal results are displayed)  Results for orders placed or performed during the hospital encounter of 04/23/19 (from the past 24 hour(s))  CBC with Differential/Platelet     Status: Abnormal   Collection Time: 04/23/19 12:47 PM  Result Value Ref Range   WBC 10.7 (H) 4.0 - 10.5 K/uL   RBC 2.83 (L) 3.87 - 5.11 MIL/uL   Hemoglobin 7.8 (L) 12.0 - 15.0  g/dL   HCT 26.1 (L) 36.0 - 46.0 %   MCV 92.2 80.0 - 100.0 fL   MCH 27.6 26.0 - 34.0 pg   MCHC 29.9 (L) 30.0 - 36.0 g/dL   RDW 23.4 (H) 11.5 - 15.5 %   Platelets 246 150 - 400 K/uL   nRBC 0.0 0.0 - 0.2 %   Neutrophils Relative % 82 %   Neutro Abs 8.6 (H) 1.7 - 7.7 K/uL   Lymphocytes Relative 11 %   Lymphs Abs 1.1 0.7 - 4.0 K/uL   Monocytes Relative 5 %   Monocytes Absolute 0.5 0.1 - 1.0 K/uL   Eosinophils Relative 1 %   Eosinophils Absolute 0.1 0.0 - 0.5 K/uL   Basophils Relative 0 %   Basophils Absolute 0.0 0.0 - 0.1 K/uL   WBC Morphology MORPHOLOGY UNREMARKABLE    Smear Review PLATELETS APPEAR ADEQUATE    Immature Granulocytes 1 %   Abs Immature Granulocytes 0.07 0.00 - 0.07 K/uL   Schistocytes PRESENT    Polychromasia PRESENT    Ovalocytes PRESENT   Comprehensive metabolic panel     Status: Abnormal   Collection Time: 04/23/19 12:47 PM  Result Value Ref Range   Sodium 141 135 - 145 mmol/L   Potassium 6.1  (H) 3.5 - 5.1 mmol/L   Chloride 106 98 - 111 mmol/L   CO2 25 22 - 32 mmol/L   Glucose, Bld 100 (H) 70 - 99 mg/dL   BUN 23 8 - 23 mg/dL   Creatinine, Ser 1.39 (H) 0.44 - 1.00 mg/dL   Calcium 8.0 (L) 8.9 - 10.3 mg/dL   Total Protein 6.0 (L) 6.5 - 8.1 g/dL   Albumin 1.9 (L) 3.5 - 5.0 g/dL   AST 23 15 - 41 U/L   ALT 16 0 - 44 U/L   Alkaline Phosphatase 117 38 - 126 U/L   Total Bilirubin 0.6 0.3 - 1.2 mg/dL   GFR calc non Af Amer 37 (L) >60 mL/min   GFR calc Af Amer 43 (L) >60 mL/min   Anion gap 10 5 - 15  Lactic acid, plasma     Status: Abnormal   Collection Time: 04/23/19 12:47 PM  Result Value Ref Range   Lactic Acid, Venous 2.6 (HH) 0.5 - 1.9 mmol/L  POC SARS Coronavirus 2 Ag     Status: None   Collection Time: 04/23/19  1:46 PM  Result Value Ref Range   SARS Coronavirus 2 Ag NEGATIVE NEGATIVE   ____________________________________________  EKG My review and personal interpretation at Time: 11:53   Indication: hyperkalemia  Rate: 100  Rhythm: sinus Axis: normal Other: peaked t waves, normal interval, no stemi ____________________________________________  RADIOLOGY  I personally reviewed all radiographic images ordered to evaluate for the above acute complaints and reviewed radiology reports and findings.  These findings were personally discussed with the patient.  Please see medical record for radiology report.  ____________________________________________   PROCEDURES  Procedure(s) performed:  .Critical Care Performed by: Merlyn Lot, MD Authorized by: Merlyn Lot, MD   Critical care provider statement:    Critical care time (minutes):  35   Critical care time was exclusive of:  Separately billable procedures and treating other patients   Critical care was necessary to treat or prevent imminent or life-threatening deterioration of the following conditions:  Metabolic crisis and renal failure   Critical care was time spent personally by me on the  following activities:  Development of treatment plan with patient or surrogate, discussions with  consultants, evaluation of patient's response to treatment, examination of patient, obtaining history from patient or surrogate, ordering and performing treatments and interventions, ordering and review of laboratory studies, ordering and review of radiographic studies, pulse oximetry, re-evaluation of patient's condition and review of old charts    Due to difficulty with obtaining IV access, a 20G peripheral IV catheter was inserted using US guidance into the LUE.  The site was prepped with chlorhexidine and allowed to dry.  The patient tolerated the procedure without any complications.   Critical Care performed: yes ____________________________________________   INITIAL IMPRESSION / ASSESSMENT AND PLAN / ED COURSE  Pertinent labs & imaging results that were available during my care of the patient were reviewed by me and considered in my medical decision making (see chart for details).   DDX: Dehydration, AKI, electrolyte abnormality, C. difficile, sepsis, medication effect.  Miranda Ryan is a 74 y.o. who presents to the ED with symptoms as described above.  Patient afebrile and vital signs are stable but frail and with multiple chronic medical conditions.  Very difficult IV access appears significantly edematous but also intravascularly depleted at the same time.  The patient will be placed on continuous pulse oximetry and telemetry for monitoring.  Laboratory evaluation will be sent to evaluate for the above complaints.     Clinical Course as of Apr 23 1526  Mon Apr 23, 2019  1335 Blood work does show evidence of AKI with hyperkalemia.  Very low albumin does have pitting edema throughout, anasarca appearing.   [PR]  1359 Patient has been temporized she did have peak T waves on EKG and telemetry monitoring.  Discussed case with Dr. Candiss Norse nephrology does agree with IV fluids, holding K. Dur as  well as starting Lokelma.  Does have mildly elevated lactic acidosis which I suspect is secondary to dehydration and diarrheal losses as compared to sepsis. Will discuss with hospitalist for admission.     [PR]    Clinical Course User Index [PR] Merlyn Lot, MD    The patient was evaluated in Emergency Department today for the symptoms described in the history of present illness. He/she was evaluated in the context of the global COVID-19 pandemic, which necessitated consideration that the patient might be at risk for infection with the SARS-CoV-2 virus that causes COVID-19. Institutional protocols and algorithms that pertain to the evaluation of patients at risk for COVID-19 are in a state of rapid change based on information released by regulatory bodies including the CDC and federal and state organizations. These policies and algorithms were followed during the patient's care in the ED.  As part of my medical decision making, I reviewed the following data within the [place] notes reviewed and incorporated, Labs reviewed, notes from prior ED visits and  Controlled Substance Database   ____________________________________________   FINAL CLINICAL IMPRESSION(S) / ED DIAGNOSES  Final diagnoses:  Acute hyperkalemia  AKI (acute kidney injury) ([place])      NEW MEDICATIONS STARTED DURING THIS VISIT:  New Prescriptions   No medications on file     Note:  This document was prepared using Dragon voice recognition software and may include unintentional dictation errors.    Merlyn Lot, MD 04/23/19 1418    Merlyn Lot, MD 04/23/19 [phone]

## 2019-04-23 NOTE — Progress Notes (Signed)
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 Wound with intact edges, Wound bed with pink and red patches of tissue. Minimal sluff. Wound tunnels. Moderate serosang. Drainage. Covered with pink foam upon admission. Foam removed and skin observed in assessment. Wound size hasn't changed since last assessment in Feb. Wound looks deeper, and can see bone and tissue of coccyx. Sm area size of pencil eraser on right lower back. Heels bilat. Boggy. Wound care nurse to evaluate in am.

## 2019-04-23 NOTE — H&P (Signed)
History and Physical    Miranda BALLEN [id number] DOB: [birth year] DOA: 04/23/2019  Referring MD/NP/PA:   PCP: [place]   Patient coming from:  The patient is coming from home.  At baseline, pt is independent for most of ADL.        Chief Complaint: Diarrhea, fever  HPI: Miranda Ryan is a 74 y.o. female with medical history significant of hyperlipidemia, diabetes mellitus, COPD, stroke, GERD, depression, anxiety, severe mitral valve stenosis, dementia, bipolar, tobacco abuse, CKD-3, recent admission due to septic shock secondary to bacteremia, who presents with diarrhea and fever.  Per her daughter who is at the bedside, patient was recently hospitalized from 2/9-2/18 due to septic shock secondary to bacteremia Tree surgeon. Facael) from decubitus ulcer.pt continued on IVUnasyn till 2/25. She was discharged to [facility]. Pt was sent home on Sat after complete course of antibiotics.  Her daughter states that the patient developed diarrhea over the weekend.  Patient has had multiple loose stool bowel movement.  Daughter treated patient with Imodium with little improvement.  No active nausea vomiting or abdominal pain.  Patient does not have chest pain, cough.  She has mild shortness of breath.  Patient has fever for 101 this morning.  In ED, her temperature is 97.7.  No symptoms of UTI or unilateral weakness.  ED Course: pt was found to have WBC 10.7, lactic acid 2.7, negative Covid Ag test, pending Covid PCR, renal function close to baseline, potassium 6.1, temperature 97.7, blood pressure 170/79, heart rate 100, RR 24, oxygen saturation 100% on room air.  Chest x-ray showed stable bilateral basilar airspace disease.  Patient is admitted to [facility] bed as inpatient.  Review of Systems:   General: has fevers, no chills, no body weight gain, has fatigue HEENT: no blurry vision, hearing changes or sore throat Respiratory: no dyspnea, coughing, wheezing CV: no chest pain, no  palpitations GI: no nausea, vomiting, abdominal pain, has diarrhea, no constipation GU: no dysuria, burning on urination, increased urinary frequency, hematuria  Ext: no leg edema Neuro: no unilateral weakness, numbness, or tingling, no vision change or hearing loss Skin: no rash. Has stage III sacral ulcer MSK: No muscle spasm, no deformity, no limitation of range of movement in spin Heme: No easy bruising.  Travel history: No recent long distant travel.  Allergy:  Allergies  Allergen Reactions  . Demerol [Meperidine] Other (See Comments)    Pt states she "turned green"     Past Medical History:  Diagnosis Date  . Acute CVA (cerebrovascular accident) ([place]) 02/22/2019  . Acute exacerbation of chronic obstructive pulmonary disease (COPD) ([place]) 04/20/2016  . Anxiety   . Bipolar disorder ([place])   . Dementia ([place])   . Depression   . Diabetes mellitus, type II ([place])   . Dysphagia 02/23/2019  . Incontinence of urine   . Low serum thyroid stimulating hormone (TSH) 02/23/2019  . Severe mitral valve stenosis 02/23/2019    Past Surgical History:  Procedure Laterality Date  . CERVIX SURGERY      Social History:  reports that she has been smoking cigarettes. She started smoking about 54 years ago. She has a 46.00 pack-year smoking history. She has never used smokeless tobacco. She reports previous alcohol use. She reports that she does not use drugs.  Family History:  Family History  Problem Relation Age of Onset  . Liver cancer Mother   . Heart attack Father   . Heart disease Sister   . Heart disease  Brother   . Heart disease Brother   . Heart disease Brother      Prior to Admission medications   Medication Sig Start Date End Date Taking? Authorizing Provider  acetaminophen (TYLENOL) 325 MG tablet Take 2 tablets (650 mg total) by mouth every 6 (six) hours as needed for mild pain (or Fever >/= 101). 04/12/19   Nolberto Hanlon, MD  acidophilus (RISAQUAD) CAPS capsule Take 1 capsule by  mouth 3 (three) times daily. 04/12/19   Nolberto Hanlon, MD  ascorbic acid (VITAMIN C) 500 MG tablet Take 1 tablet (500 mg total) by mouth 2 (two) times daily. 04/12/19   Nolberto Hanlon, MD  aspirin EC 81 MG EC tablet Take 1 tablet (81 mg total) by mouth daily. 02/25/19   Samuella Cota, MD  atorvastatin (LIPITOR) 40 MG tablet Take 1 tablet (40 mg total) by mouth daily at 6 PM. 02/24/19   Samuella Cota, MD  busPIRone (BUSPAR) 10 MG tablet Take 10 mg by mouth 2 (two) times daily.    [provider]  collagenase (SANTYL) ointment Apply topically 2 (two) times daily. 04/12/19   Nolberto Hanlon, MD  feeding supplement, ENSURE ENLIVE, (ENSURE ENLIVE) LIQD Take 237 mLs by mouth 2 (two) times daily between meals. 02/24/19   Samuella Cota, MD  linagliptin (TRADJENTA) 5 MG TABS tablet Take 5 mg by mouth daily.    [provider]  metFORMIN (GLUCOPHAGE) 500 MG tablet Take 1 tablet (500 mg total) by mouth 2 (two) times daily with a meal. 02/24/19 02/24/20  Samuella Cota, MD  Multiple Vitamin (MULTIVITAMIN WITH MINERALS) TABS tablet Take 1 tablet by mouth daily. 04/13/19   Nolberto Hanlon, MD  pantoprazole (PROTONIX) 40 MG tablet Take 1 tablet (40 mg total) by mouth daily. 04/13/19   Nolberto Hanlon, MD  polyvinyl alcohol (LIQUIFILM TEARS) 1.4 % ophthalmic solution Place 1 drop into both eyes 4 (four) times daily as needed for dry eyes. 04/12/19   Nolberto Hanlon, MD  potassium chloride SA (KLOR-CON) 20 MEQ tablet Take 2 tablets (40 mEq total) by mouth daily for 1 day. 04/13/19 04/14/19  Nolberto Hanlon, MD  silver nitrate applicators [id number] % applicator Apply 10 Sticks topically as needed for bleeding. 04/12/19   Nolberto Hanlon, MD    Physical Exam: Vitals:   04/23/19 1400 04/23/19 1512 04/23/19 1530 04/23/19 1730  BP: (!) 153/73 (!) 147/68 (!) 170/68 (!) 157/65  Pulse: 86 98 (!) 47 90  Resp: (!) 24 16 (!) 21   Temp:    98.7 F (37.1 C)  SpO2: 100% 97% (!) 89% 97%  Weight:      Height:       General: Not  in acute distress HEENT:       Eyes: PERRL, EOMI, no scleral icterus.       ENT: No discharge from the ears and nose, no pharynx injection, no tonsillar enlargement.        Neck: No JVD, no bruit, no mass felt. Heme: No neck lymph node enlargement. Cardiac: S1/S2, RRR, No gallops or rubs. Respiratory: No rales, wheezing, rhonchi or rubs. GI: Soft, nondistended, nontender, no rebound pain, no organomegaly, BS present. GU: No hematuria Ext: No pitting leg edema bilaterally. 2+DP/PT pulse bilaterally. Musculoskeletal: No joint deformities, No joint redness or warmth, no limitation of ROM in spin. Skin: has stage III sacral ulcer Neuro: Alert, cranial nerves II-XII grossly intact, moves all extremities normally. Psych: Patient is not psychotic, no suicidal or hemocidal ideation.  Labs on Admission: I have personally reviewed following labs and imaging studies  CBC: Recent Labs  Lab 04/23/19 1247  WBC 10.7*  NEUTROABS 8.6*  HGB 7.8*  HCT 26.1*  MCV 92.2  PLT 0000000   Basic Metabolic Panel: Recent Labs  Lab 04/23/19 1247  NA 141  K 6.1*  CL 106  CO2 25  GLUCOSE 100*  BUN 23  CREATININE 1.39*  CALCIUM 8.0*   GFR: Estimated Creatinine Clearance: 29.4 mL/min (A) (by C-G formula based on SCr of 1.39 mg/dL (H)). Liver Function Tests: Recent Labs  Lab 04/23/19 1247  AST 23  ALT 16  ALKPHOS 117  BILITOT 0.6  PROT 6.0*  ALBUMIN 1.9*   No results for input(s): LIPASE, AMYLASE in the last 168 hours. No results for input(s): AMMONIA in the last 168 hours. Coagulation Profile: No results for input(s): INR, PROTIME in the last 168 hours. Cardiac Enzymes: No results for input(s): CKTOTAL, CKMB, CKMBINDEX, TROPONINI in the last 168 hours. BNP (last 3 results) No results for input(s): PROBNP in the last 8760 hours. HbA1C: No results for input(s): HGBA1C in the last 72 hours. CBG: Recent Labs  Lab 04/23/19 1735  GLUCAP 84   Lipid Profile: No results for input(s): CHOL,  HDL, LDLCALC, TRIG, CHOLHDL, LDLDIRECT in the last 72 hours. Thyroid Function Tests: No results for input(s): TSH, T4TOTAL, FREET4, T3FREE, THYROIDAB in the last 72 hours. Anemia Panel: No results for input(s): VITAMINB12, FOLATE, FERRITIN, TIBC, IRON, RETICCTPCT in the last 72 hours. Urine analysis:    Component Value Date/Time   COLORURINE YELLOW (A) 04/04/2019 0757   APPEARANCEUR HAZY (A) 04/04/2019 0757   LABSPEC 1.017 04/04/2019 0757   PHURINE 5.0 04/04/2019 0757   GLUCOSEU NEGATIVE 04/04/2019 0757   HGBUR NEGATIVE 04/04/2019 0757   BILIRUBINUR NEGATIVE 04/04/2019 [place] 04/04/2019 0757   PROTEINUR 30 (A) 04/04/2019 0757   NITRITE NEGATIVE 04/04/2019 0757   LEUKOCYTESUR NEGATIVE 04/04/2019 0757   Sepsis Labs: @LABRCNTIP (procalcitonin:4,lacticidven:4) )No results found for this or any previous visit (from the past 240 hour(s)).   Radiological Exams on Admission: DG Chest Portable 1 View  Result Date: 04/23/2019 CLINICAL DATA:  Pt to ED by [facility] due to family concerns over wound. Pt recently hospitalized for sepsis and discharged to [facility]. Pt sent home on Sat after antibiotic therapy at [facility]. Pt's daughter also reports pt had diarrhea over the .*comment was truncated*fever, ams, eval for infiltrate EXAM: PORTABLE CHEST 1 VIEW COMPARISON:  04/03/2019 FINDINGS: Normal cardiac silhouette. Mild increased airspace disease lung bases. No focal infiltrate. No pleural fluid. No pneumothorax. IMPRESSION: Subtle bibasilar airspace disease could represent pulmonary edema or viral pneumonia. Electronically Signed   By: Suzy Bouchard M.D.   On: 04/23/2019 12:53     EKG: Independently reviewed.  Sinus rhythm, QTC 444, LAE, LAD, mild T wave peaking  Assessment/Plan Principal Problem:   Hyperkalemia Active Problems:   Bacteremia   Stroke (HCC)   HLD (hyperlipidemia)   COPD (chronic obstructive pulmonary disease) (HCC)   GERD (gastroesophageal reflux  disease)   Depression   CKD (chronic kidney disease), stage IIIa   Diarrhea   Sacral decubitus ulcer, stage III (HCC)   Normocytic anemia   Hyperkalemia: K 6.1. pt had hypokalemia in previous admission, and discharged potassium supplement, which is likely the etiology.  -Admitted to MedSurg bed as inpatient -Patient was treated with calcium gluconate, D50, NovoLog -Lokelma 10 g daily x2 (ED physician discussed that this plan with nephrologist, Dr.  Candiss Norse)  Recent bacteremia Tree surgeon. Facael) and suspected sepsis: Patient completed a course of antibiotics.  Daughter states that patient had a fever of 101.  She has mild leukocytosis 10.7, tachycardia, tachypnea.  Lactic acid is elevated at 2.6, suspecting sepsis. -Start Rocephin empirically -Follow-up blood culture and urinalysis -trend lactic acid -IVF: total of 1.25 L of NS, then 75 cc/h  Stroke ([place]): -ASA and lipitor  HLD (hyperlipidemia) -Lipitor  COPD (chronic obstructive pulmonary disease) ([place]): stable -prn albuterol  GERD (gastroesophageal reflux disease) -Protonix  Depression: No suicidal homicidal ideation -Continue home medications  CKD (chronic kidney disease), stage IIIa: Close to baseline.  Baseline creatinine 1.0-1.3.  Her creatinine is 1.39, BUN 23 -Patient is on IV fluid -Follow-up with BMP  Diarrhea: -check D diff pcr  Sacral decubitus ulcer, stage III ([place]): -continue wound care  Normocytic anemia: Hgb no significant change, 8.0  On 04/11/19 -->7.8 -f/u by CBC    Inpatient status:  # Patient requires inpatient status due to high intensity of service, high risk for further deterioration and high frequency of surveillance required.  I certify that at the point of admission it is my clinical judgment that the patient will require inpatient hospital care spanning beyond 2 midnights from the point of admission.  . This patient has multiple chronic comorbidities including hyperlipidemia, diabetes  mellitus, COPD, stroke, GERD, depression, anxiety, severe mitral valve stenosis, dementia, bipolar, tobacco abuse, CKD-3, recent admission due to septic shock secondary to bacteremia ([facility]). . Now patient has presenting with fever, possible sepsis, diarrhea, hyperkalemia . The worrisome physical exam findings include stage III sacral ulcer . The initial radiographic and laboratory data are worrisome because of hyperkalemia, elevated lactic acid, . Current medical needs: please see my assessment and plan . Predictability of an adverse outcome (risk): Patient has multiple comorbidities as listed above. Now presents with fever, possible sepsis, diarrhea, hyperkalemia. Patient's presentation is highly complicated.  Patient is at high risk of deteriorating.  Will need to be treated in hospital for at least 2 days.           DVT ppx: SQ Lovenox Code Status: DNR per her daughter Family Communication:  Yes, patient's daughter at bed side Disposition Plan:  Anticipate discharge back to previous home environment Consults called:  None Admission status: Med-surg bed as inpt    Date of Service 04/23/2019    [facility]   If 7PM-7AM, please contact night-coverage [email or web address] 04/23/2019, 6:29 PM

## 2019-04-23 NOTE — Progress Notes (Signed)
Anticoagulation monitoring(Lovenox):  74 yo female ordered Lovenox 40 mg Q24h  Filed Weights   04/23/19 1156  Weight: 116 lb (52.6 kg)   BMI    Lab Results  Component Value Date   CREATININE 1.39 (H) 04/23/2019   CREATININE 0.92 04/12/2019   CREATININE 0.87 04/11/2019   Estimated Creatinine Clearance: 29.4 mL/min (A) (by C-G formula based on SCr of 1.39 mg/dL (H)). Hemoglobin & Hematocrit     Component Value Date/Time   HGB 7.8 (L) 04/23/2019 1247   HCT 26.1 (L) 04/23/2019 1247     Per Protocol for Patient with estCrcl < 30 ml/min and BMI < 40, will transition to Lovenox 30 mg Q24h.

## 2019-04-24 ENCOUNTER — Inpatient Hospital Stay: Payer: Medicare Other

## 2019-04-24 LAB — LACTIC ACID, PLASMA
Lactic Acid, Venous: 1.9 mmol/L (ref 0.5–1.9)
Lactic Acid, Venous: 2.4 mmol/L (ref 0.5–1.9)
Lactic Acid, Venous: 2.5 mmol/L (ref 0.5–1.9)

## 2019-04-24 LAB — BASIC METABOLIC PANEL
Anion gap: 10 (ref 5–15)
BUN: 22 mg/dL (ref 8–23)
CO2: 22 mmol/L (ref 22–32)
Calcium: 7.6 mg/dL — ABNORMAL LOW (ref 8.9–10.3)
Chloride: 108 mmol/L (ref 98–111)
Creatinine, Ser: 1.19 mg/dL — ABNORMAL HIGH (ref 0.44–1.00)
GFR calc Af Amer: 52 mL/min — ABNORMAL LOW (ref >60)
GFR calc non Af Amer: 45 mL/min — ABNORMAL LOW (ref >60)
Glucose, Bld: 81 mg/dL (ref 70–99)
Potassium: 5.2 mmol/L — ABNORMAL HIGH (ref 3.5–5.1)
Sodium: 140 mmol/L (ref 135–145)

## 2019-04-24 LAB — CBC
HCT: 26.6 % — ABNORMAL LOW (ref 36.0–46.0)
Hemoglobin: 7.8 g/dL — ABNORMAL LOW (ref 12.0–15.0)
MCH: 27.3 pg (ref 26.0–34.0)
MCHC: 29.3 g/dL — ABNORMAL LOW (ref 30.0–36.0)
MCV: 93 fL (ref 80.0–100.0)
Platelets: 190 10*3/uL (ref 150–400)
RBC: 2.86 MIL/uL — ABNORMAL LOW (ref 3.87–5.11)
RDW: 23.5 % — ABNORMAL HIGH (ref 11.5–15.5)
WBC: 8.8 10*3/uL (ref 4.0–10.5)
nRBC: 0 % (ref 0.0–0.2)

## 2019-04-24 LAB — GLUCOSE, CAPILLARY
Glucose-Capillary: 63 mg/dL — ABNORMAL LOW (ref 70–99)
Glucose-Capillary: 91 mg/dL (ref 70–99)
Glucose-Capillary: 92 mg/dL (ref 70–99)
Glucose-Capillary: 86 mg/dL (ref 70–99)
Glucose-Capillary: 138 mg/dL — ABNORMAL HIGH (ref 70–99)

## 2019-04-24 LAB — SARS CORONAVIRUS 2 (TAT 6-24 HRS): SARS Coronavirus 2: NEGATIVE

## 2019-04-24 LAB — POTASSIUM: Potassium: 4.3 mmol/L (ref 3.5–5.1)

## 2019-04-24 IMAGING — DX DG PELVIS 1-2V
1 series · 1 of 1 positions shown · non-contrast
Comparison: None.

CLINICAL DATA: Pressure ulcer posterior pelvis

EXAM:
PELVIS - 1-2 VIEW

[pelvis ap]
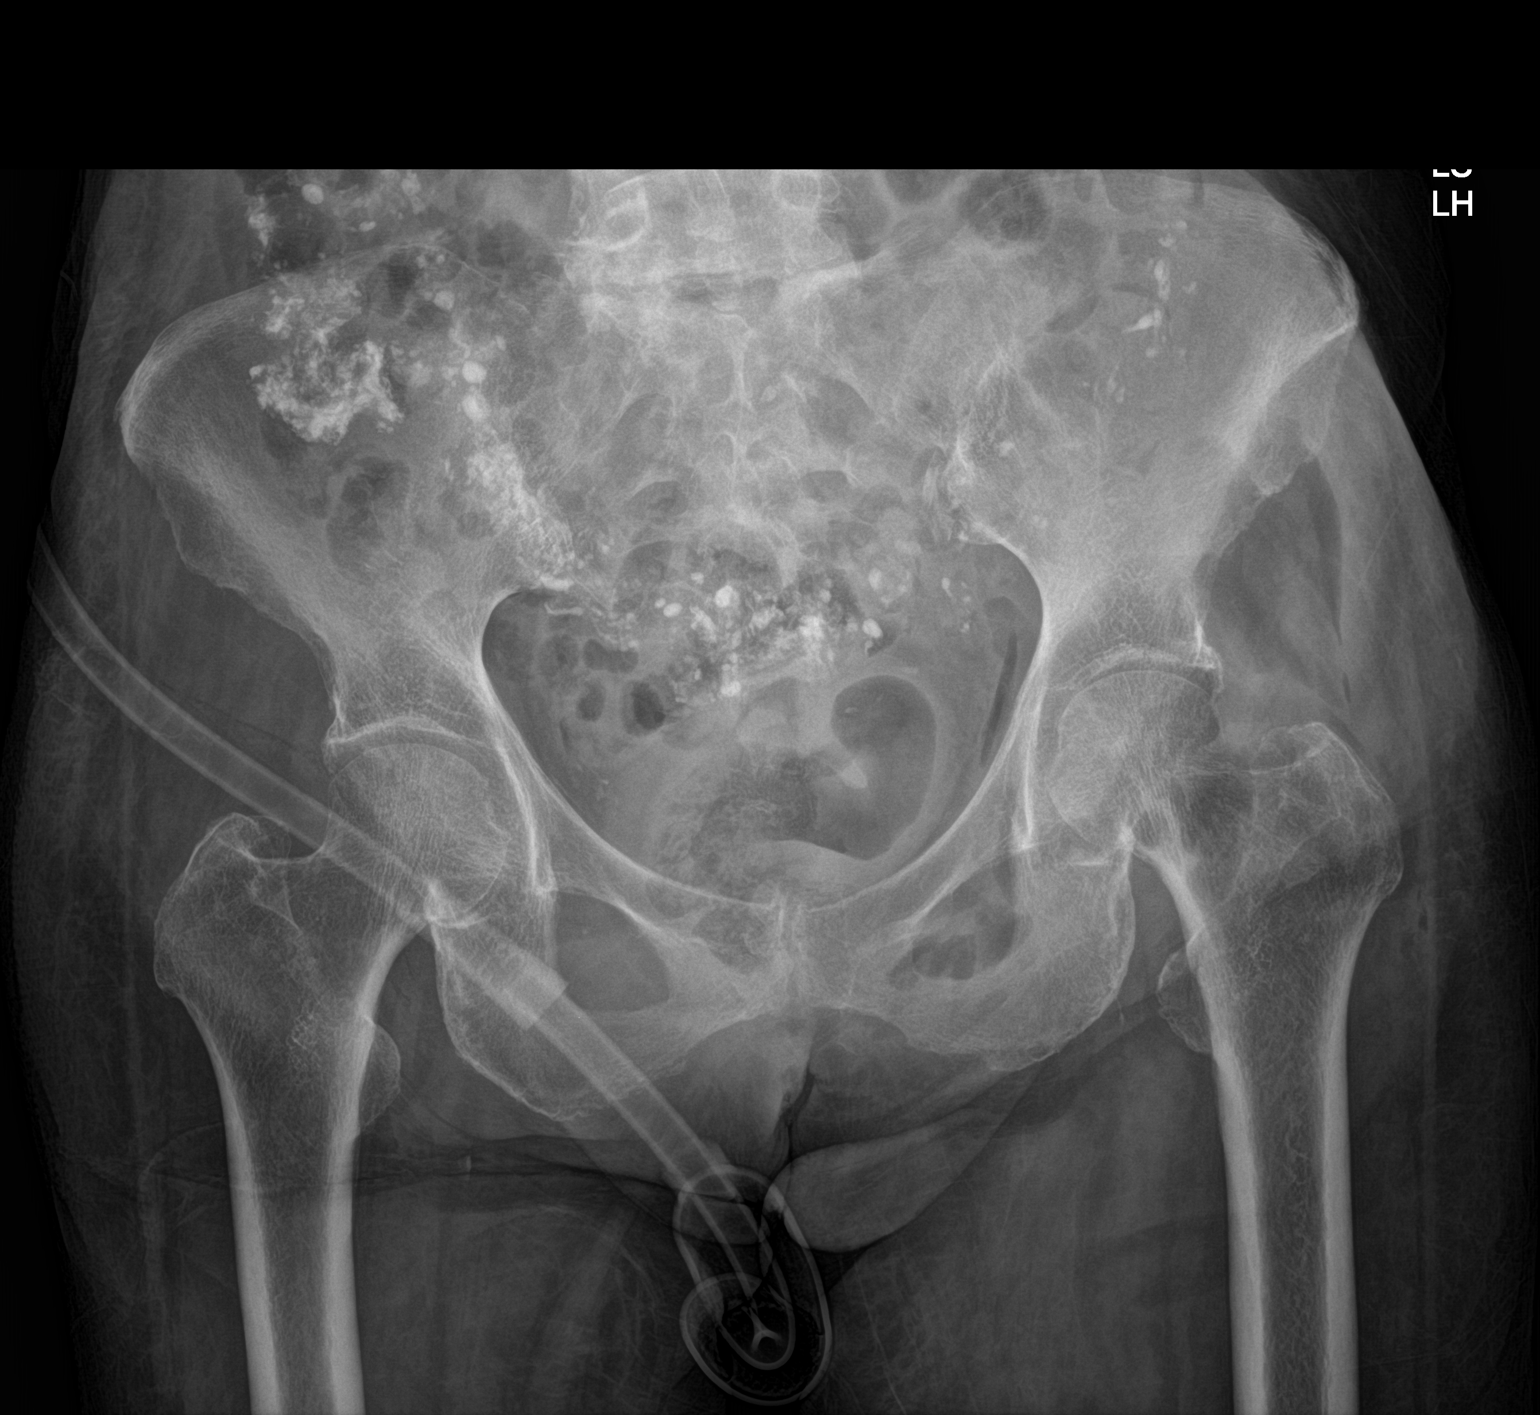

[1 of 1 positions shown; findings below may reference images not displayed]

FINDINGS: No destructive osseous changes within limitation of overlying bowel
gas and residual barium. Sacroiliac joints are obscured.
IMPRESSION: No gross abnormality.  Suboptimal evaluation for osteomyelitis.

## 2019-04-24 IMAGING — MR MR PELVIS W/O CM
4 of 5 series · 29 of 48 positions shown · non-contrast
Comparison: None.

CLINICAL DATA: Question of osteomyelitis, sacral decubitus ulcer

EXAM:
MRI PELVIS WITHOUT CONTRAST
TECHNIQUE: Multiplanar multisequence MR imaging of the pelvis was performed. No
intravenous contrast was administered.

[Series 6: STIR · coronal · 4.0mm · 1.12mm/px · 9 of 41 slices shown]
[im 1/41]
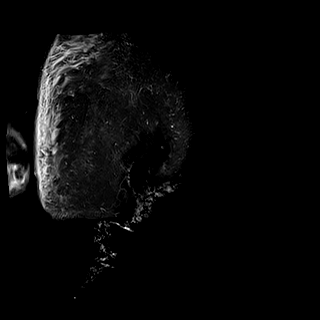
[im 6/41]
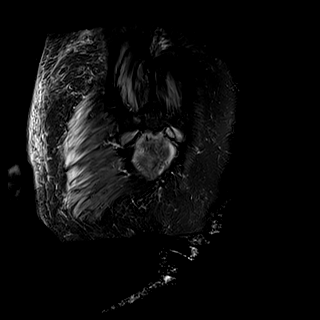
[im 11/41]
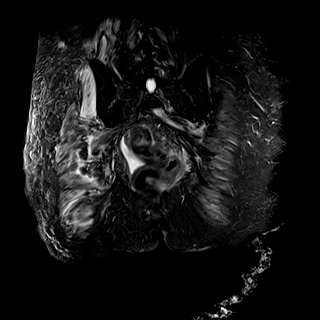
[im 16/41]
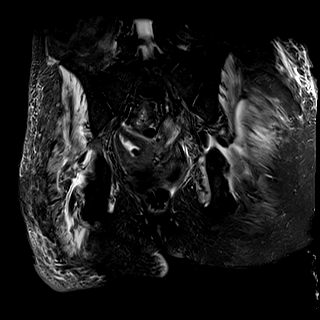
[im 21/41]
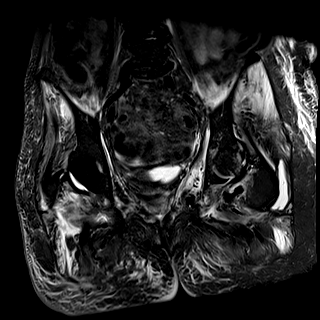
[im 26/41]
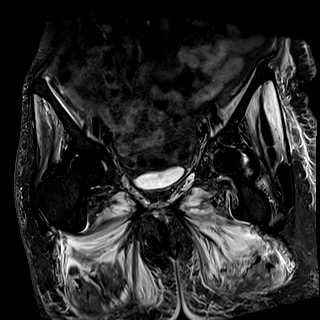
[im 31/41]
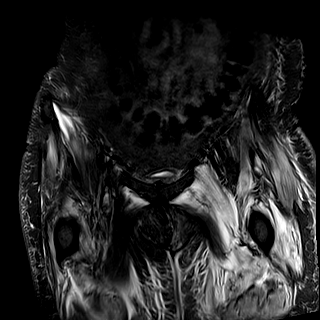
[im 36/41]
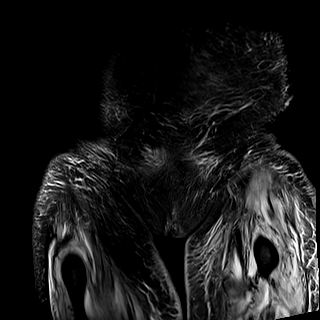
[im 41/41]
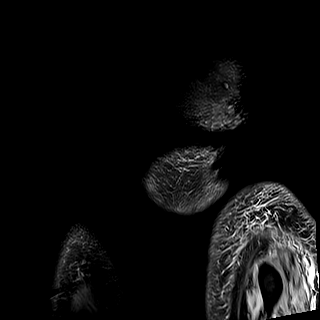

[Series 7: T1 · coronal · 4.0mm · 1.25mm/px · 9 of 41 slices shown (1 of 2)]
[im 1/41]
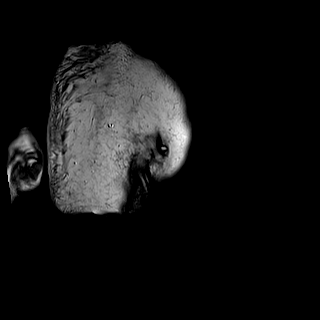
[im 6/41]
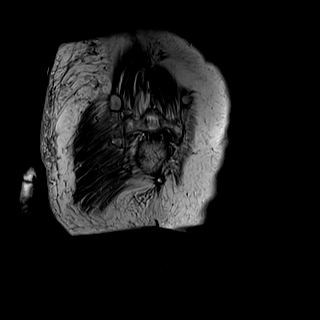
[im 11/41]
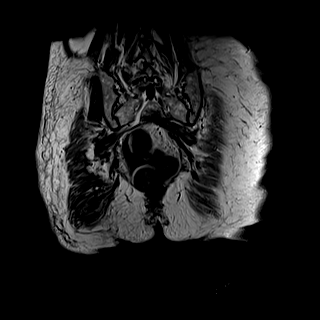
[im 16/41]
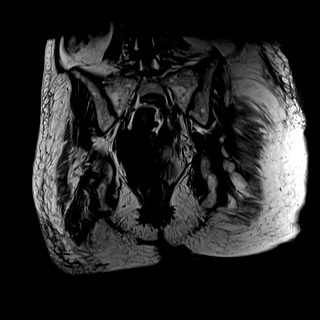
[im 21/41]
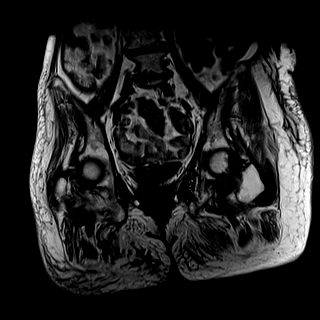
[im 26/41]
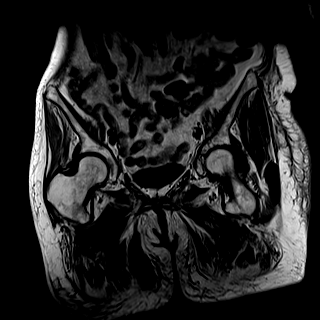
[im 31/41]
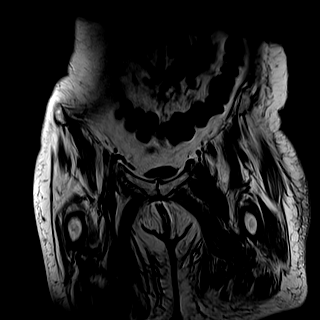
[im 36/41]
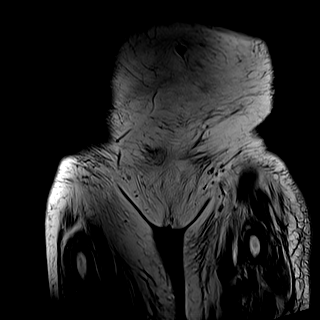
[im 41/41]
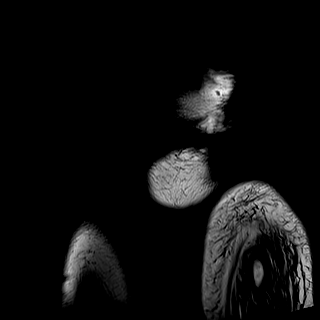

[Series 8: T2 fat-sat · axial · 4.5mm · 0.74mm/px · z∈[-149,+81]mm · 8 of 43 slices shown]
[im 1/43]
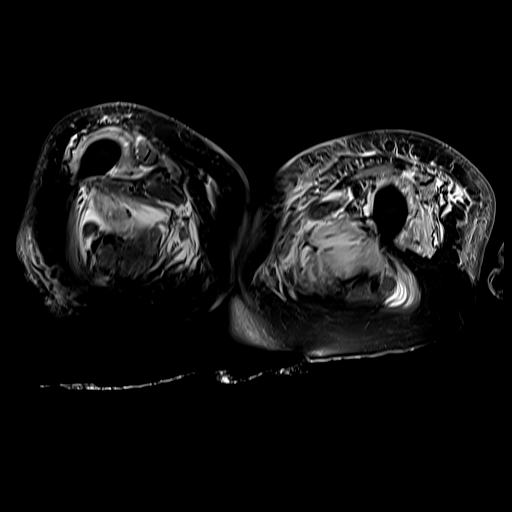
[im 5/43]
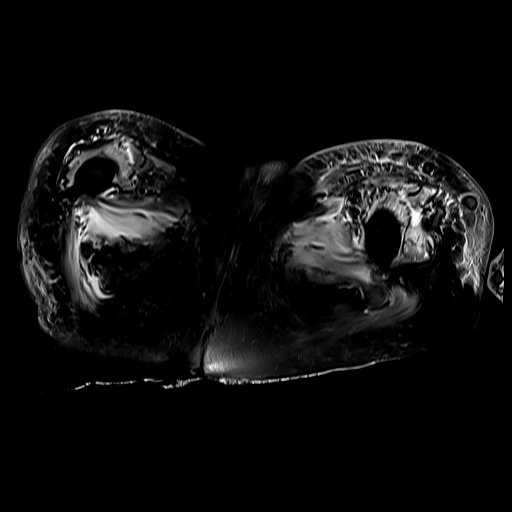
[im 15/43]
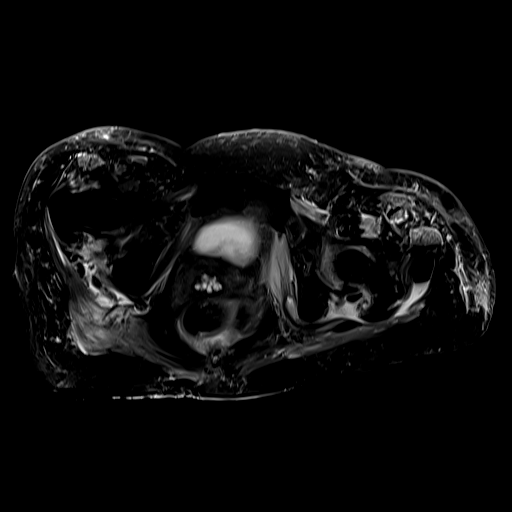
[im 19/43]
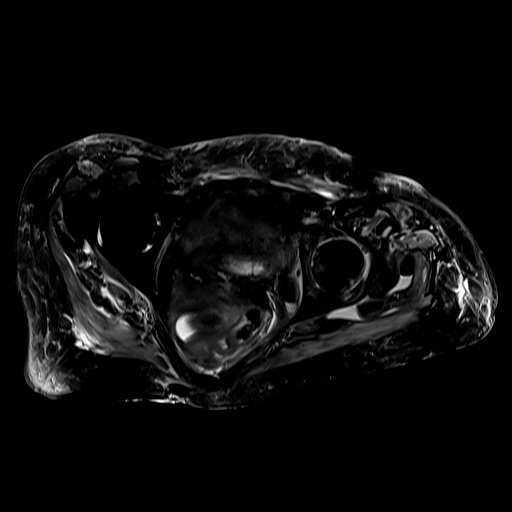
[im 24/43]
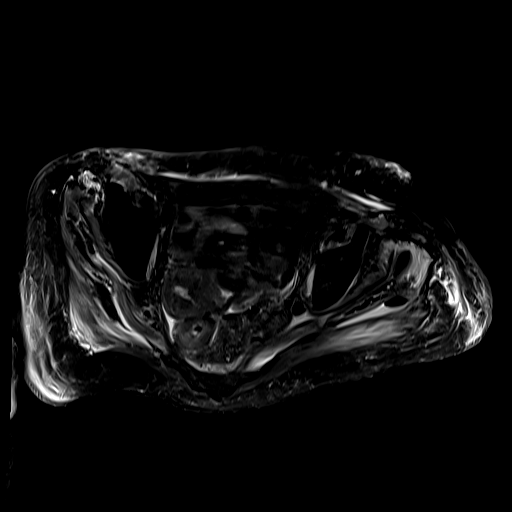
[im 29/43]
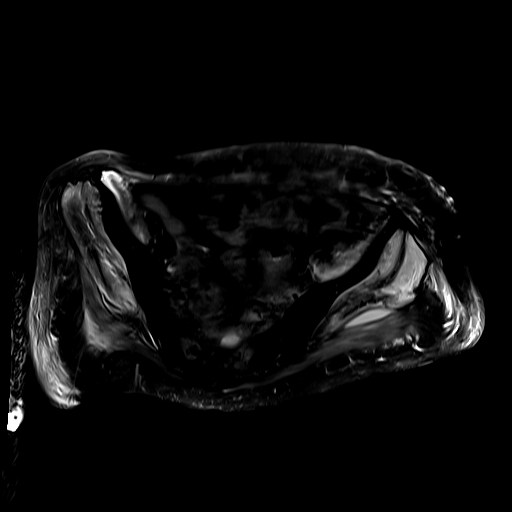
[im 38/43]
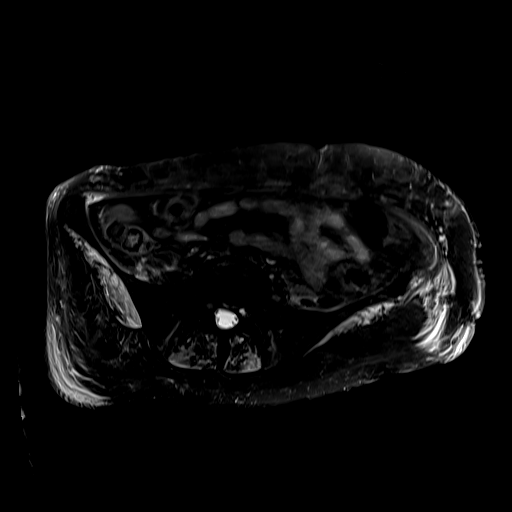
[im 43/43]
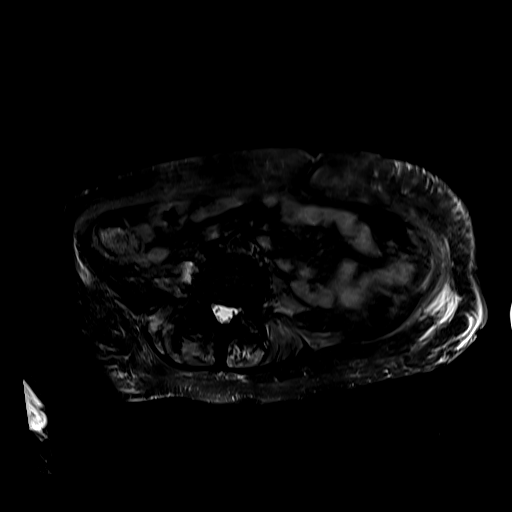

[Series 9: T1 · axial · 4.5mm · 0.74mm/px · z∈[-127,+53]mm · 3 of 43 slices shown (2 of 2)]
[im 5/43]
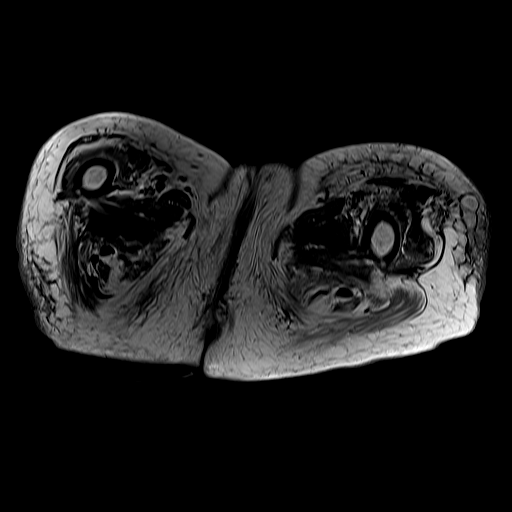
[im 24/43]
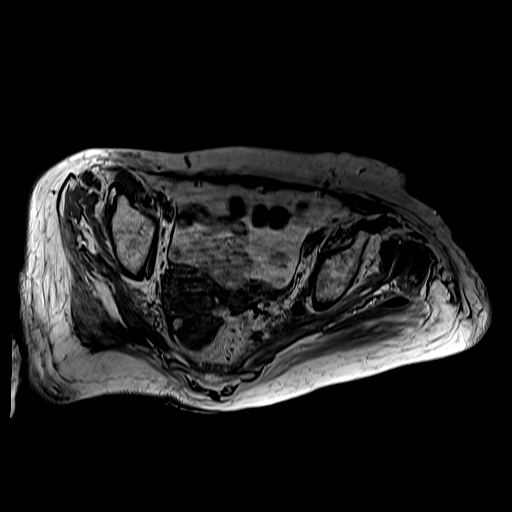
[im 38/43]
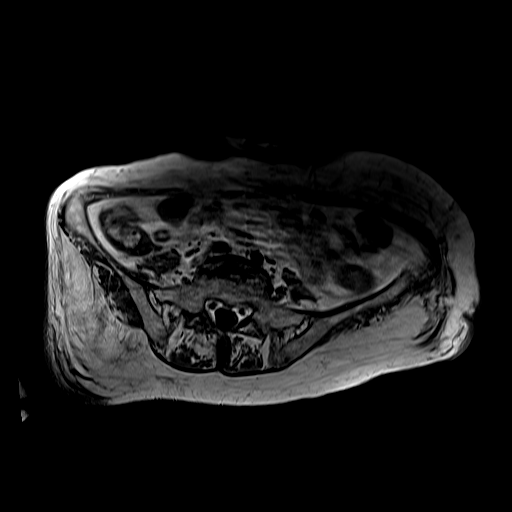

[29 of 48 positions shown; findings below may reference images not displayed]

FINDINGS: Urinary Tract: The visualized distal ureters and bladder appear
unremarkable.

Bowel: No bowel wall thickening, distention or surrounding
inflammation identified within the pelvis.

Vascular/Lymphatic: No enlarged pelvic lymph nodes identified. No
significant vascular findings.

Reproductive: Small nabothian cysts are present. There is a T1/T2
dark intramural uterine fibroid seen in the posterior fundus.

Other: A small amount of free fluid is seen within the pelvis.

Musculoskeletal: Overlying the posterior right inferior coccyx there
is a decubitus ulcer measuring 2.4 cm in transverse dimension. No
sinus tract or loculated fluid collection is noted. No area of
cortical destruction or periosteal reaction is seen. There is
moderate right and moderate to advanced left hip osteoarthritis with
superior joint space loss. There is reactive marrow seen at the
femoroacetabular joint at the left hip. There is diffusely increased
signal seen throughout the muscles surrounding the pelvis with mild
fatty atrophy. There is also subcutaneous edema seen around the
posterior pelvis. The visualized portions of the tendons appear to
be grossly intact.
IMPRESSION: 1. Posterior right inferior decubitus ulcer overlying the coccyx. No
loculated fluid collection or evidence of osteomyelitis.
2. Diffuse increased signal seen throughout the muscles, which could
be due to myositis or acute denervation atrophy.
3. Moderate to advanced left hip osteoarthritis with reactive
marrow.
4. Small deep pelvic ascites.
5. Intramural uterine fibroids.

## 2019-04-24 MED ORDER — COLLAGENASE 250 UNIT/GM EX OINT: TOPICAL_OINTMENT | Freq: Two times a day (BID) | CUTANEOUS | Status: DC

## 2019-04-24 MED ORDER — ENOXAPARIN SODIUM 40 MG/0.4ML ~~LOC~~ SOLN
40.0000 mg | SUBCUTANEOUS | Status: DC
  Administered 2019-04-24: 40 mg via SUBCUTANEOUS
  Filled 2019-04-24: qty 0.4

## 2019-04-24 MED ORDER — SODIUM CHLORIDE 0.9 % IV BOLUS
1000.0000 mL | Freq: Once | INTRAVENOUS | Status: AC
  Administered 2019-04-24: 1000 mL via INTRAVENOUS

## 2019-04-24 MED ORDER — ENSURE ENLIVE PO LIQD: 237.0000 mL | Freq: Three times a day (TID) | ORAL | Status: DC

## 2019-04-24 MED ORDER — ENSURE ENLIVE PO LIQD
237.0000 mL | Freq: Two times a day (BID) | ORAL | Status: DC
  Administered 2019-04-25 (×2): 237 mL via ORAL

## 2019-04-24 MED ORDER — COLLAGENASE 250 UNIT/GM EX OINT
TOPICAL_OINTMENT | Freq: Every day | CUTANEOUS | Status: DC
  Filled 2019-04-24: qty 30

## 2019-04-24 NOTE — Progress Notes (Addendum)
PROGRESS NOTE    Miranda Ryan  [id number] DOB: [birth year] DOA: 04/23/2019 PCP: [facility]   Brief Narrative:  Miranda Ryan is a 74 y.o. female with medical history significant of hyperlipidemia, diabetes mellitus, COPD, stroke, GERD, depression, anxiety, severe mitral valve stenosis, dementia, bipolar, tobacco abuse, CKD-3, recent admission due to septic shock secondary to Enterococcus faecalis bacteremia, who presents with diarrhea and fever.  Completed course of Unasyn on 04/19/2019.  Also found to be hyperkalemic, AKI and worsening of of her sacral wounds.  Subjective: Nonverbal, just nodding her head.  She was able to tell me her name only.  She nodded no for any pain.  Assessment & Plan:   Principal Problem:   Hyperkalemia Active Problems:   Bacteremia   Stroke (HCC)   HLD (hyperlipidemia)   COPD (chronic obstructive pulmonary disease) (HCC)   GERD (gastroesophageal reflux disease)   Depression   CKD (chronic kidney disease), stage IIIa   Diarrhea   Sacral decubitus ulcer, stage III (HCC)   Normocytic anemia   Sepsis (HCC)  Hyperkalemia:  Potassium improved to 5.2 this morning from 6.1 with intervention. -Continue to monitor and repeat potassium levels in afternoon. -We can repeat Lokelma if needed.  Recent Aerococcus faecalis bacteremia and suspected sepsis. Leukocytosis improving but continues to have lactic acidosis.  Remain afebrile while in the hospital but there are reports of being febrile up to 101 at home. Her sacral wound appears worse and reaching up to bone.  Pelvic x-ray without any significant sign of osteomyelitis but insufficient study for bone disease. No UA collected yet.  Blood culture remain negative. There was some concern about C. difficile due to diarrhea-no BM since in hospital. She was started on ceftriaxone empirically. -Give her another 1 L of bolus as lactic acid remains at 2.4. -Repeat lactic acid in 3-hour after  bolus. -Continue ceftriaxone for now. -Try collecting UA-might not give [place] much idea as patient is already on antibiotics. -Palliative care consult to discuss goals of care-apparently her daughter does not want to involve palliative, she was very upset with them when we involved them during previous hospitalization.  Sacral decubitus ulcer, stage IV ([place]): Worsening of her sacral decubitus ulcer which is now reaching to bone now.  Concern for osteomyelitis. X-ray was suboptimal for the studies. -We will get MRI. -Reconsulted surgery again to see if she needs more debridement. -Wound care was also consulted-appreciate their help.  CKD (chronic kidney disease), stage IIIa:  Creatinine improved to 1.19 today with fluids.  Baseline between 1.1-1.3. -Continue to monitor renal function. -Avoid nephrotoxins.  History of stroke with poor functional status.  Patient is basically bed bound and completely dependent for her ADLs.  Try calling her daughter [name] to get some baseline as patient was nonverbal.  She was extremely rude, not even letting me complete any sentences and keep blaming that we are responsible for her condition and she wants to take her mother to [facility]. -Continue aspirin and Lipitor.  COPD (chronic obstructive pulmonary disease) (HCC): stable -prn albuterol  GERD (gastroesophageal reflux disease) -Protonix  Depression:  -Continue home medications  Normocytic anemia: Hemoglobin stable around 7.8. -Continue to monitor.  Objective: Vitals:   04/23/19 1730 04/23/19 1906 04/23/19 2149 04/24/19 0427  BP: (!) 157/65 (!) 157/65 (!) 153/71 136/67  Pulse: 90 90 94 93  Resp: (!) 21 (!) 21 18 16   Temp: 98.7 F (37.1 C) 98.7 F (37.1 C) (!) 97.3 F (36.3 C) 98.4 F (  36.9 C)  TempSrc:      SpO2: 97%  100% 93%  Weight:  52.6 kg    Height:  5\' 3"  (1.6 m)      Intake/Output Summary (Last 24 hours) at 04/24/2019 0741 Last data filed at 04/24/2019 [id number] Gross per 24 hour   Intake 854 ml  Output 200 ml  Net 654 ml   Filed Weights   04/23/19 1156 04/23/19 1906  Weight: 52.6 kg 52.6 kg    Examination:  General exam: Chronically ill-appearing elderly lady, mostly nonverbal, appears calm and comfortable  Respiratory system: Clear to auscultation. Respiratory effort normal. Cardiovascular system: S1 & S2 heard, RRR. No JVD, murmurs, rubs, gallops or clicks. Gastrointestinal system: Soft, nontender, nondistended, bowel sounds positive. Central nervous system: Alert but nonverbal, only tells me her name. Extremities: No edema, no cyanosis, pulses intact and symmetrical. Skin: Sacral, buttock and heel wounds. Psychiatry: Judgement and insight appear normal. Mood & affect appropriate.    DVT prophylaxis: Lovenox Code Status: DNR Family Communication: Called her daughter [name] to get some update but she was extremely rude and just keep blaming [place] for her condition.  She was not even letting me complete any sentence.  I was trying to explain that today is my very first day of seeing her but she continue shouting.  I hang up on her as I will not tolerate this type of behavior and will not communicate with her again.  Disposition Plan: Pending improvement.  Consultants:   Surgery  Procedures:  Antimicrobials:  Ceftriaxone  Data Reviewed: I have personally reviewed following labs and imaging studies  CBC: Recent Labs  Lab 04/23/19 1247 04/24/19 0617  WBC 10.7* 8.8  NEUTROABS 8.6*  --   HGB 7.8* 7.8*  HCT 26.1* 26.6*  MCV 92.2 93.0  PLT 246 [id number]   Basic Metabolic Panel: Recent Labs  Lab 04/23/19 1247 04/24/19 0617  NA 141 140  K 6.1* 5.2*  CL 106 108  CO2 25 22  GLUCOSE 100* 81  BUN 23 22  CREATININE 1.39* 1.19*  CALCIUM 8.0* 7.6*   GFR: Estimated Creatinine Clearance: 34.3 mL/min (A) (by C-G formula based on SCr of 1.19 mg/dL (H)). Liver Function Tests: Recent Labs  Lab 04/23/19 1247  AST 23  ALT 16  ALKPHOS 117  BILITOT 0.6   PROT 6.0*  ALBUMIN 1.9*   No results for input(s): LIPASE, AMYLASE in the last 168 hours. No results for input(s): AMMONIA in the last 168 hours. Coagulation Profile: No results for input(s): INR, PROTIME in the last 168 hours. Cardiac Enzymes: No results for input(s): CKTOTAL, CKMB, CKMBINDEX, TROPONINI in the last 168 hours. BNP (last 3 results) No results for input(s): PROBNP in the last 8760 hours. HbA1C: No results for input(s): HGBA1C in the last 72 hours. CBG: Recent Labs  Lab 04/23/19 1735 04/23/19 2144  GLUCAP 84 98   Lipid Profile: No results for input(s): CHOL, HDL, LDLCALC, TRIG, CHOLHDL, LDLDIRECT in the last 72 hours. Thyroid Function Tests: No results for input(s): TSH, T4TOTAL, FREET4, T3FREE, THYROIDAB in the last 72 hours. Anemia Panel: No results for input(s): VITAMINB12, FOLATE, FERRITIN, TIBC, IRON, RETICCTPCT in the last 72 hours. Sepsis Labs: Recent Labs  Lab 04/23/19 1247  LATICACIDVEN 2.6*    Recent Results (from the past 240 hour(s))  Blood culture (routine x 2)     Status: None (Preliminary result)   Collection Time: 04/23/19  2:20 PM   Specimen: BLOOD  Result Value Ref Range Status  Specimen Description BLOOD BLOOD RIGHT HAND  Final   Special Requests   Final    BOTTLES DRAWN AEROBIC ONLY Blood Culture adequate volume   Culture   Final    NO GROWTH < 24 HOURS Performed at [facility], [address] [place], [place] [address]    Report Status PENDING  Incomplete  SARS CORONAVIRUS 2 (TAT 6-24 HRS) Nasopharyngeal Nasopharyngeal Swab     Status: None   Collection Time: 04/23/19  3:14 PM   Specimen: Nasopharyngeal Swab  Result Value Ref Range Status   SARS Coronavirus 2 NEGATIVE NEGATIVE Final    Comment: (NOTE) SARS-CoV-2 target nucleic acids are NOT DETECTED. The SARS-CoV-2 RNA is generally detectable in upper and lower respiratory specimens during the acute phase of infection. Negative results do not preclude SARS-CoV-2  infection, do not rule out co-infections with other pathogens, and should not be used as the sole basis for treatment or other patient management decisions. Negative results must be combined with clinical observations, patient history, and epidemiological information. The expected result is Negative. Fact Sheet for Patients: SugarRoll.be Fact Sheet for Healthcare Providers: https://www.woods-mathews.com/ This test is not yet approved or cleared by the [place] FDA and  has been authorized for detection and/or diagnosis of SARS-CoV-2 by FDA under an Emergency Use Authorization (EUA). This EUA will remain  in effect (meaning this test can be used) for the duration of the COVID-19 declaration under Section 56 4(b)(1) of the Act, 21 U.S.C. section 360bbb-3(b)(1), unless the authorization is terminated or revoked sooner. Performed at [facility], [place] [address], [place], [place] [address]   Blood culture (routine x 2)     Status: None (Preliminary result)   Collection Time: 04/23/19  5:44 PM   Specimen: BLOOD  Result Value Ref Range Status   Specimen Description BLOOD BLOOD LEFT HAND  Final   Special Requests   Final    BOTTLES DRAWN AEROBIC ONLY Blood Culture adequate volume   Culture   Final    NO GROWTH < 12 HOURS Performed at [facility], [address], [place], [place] [address]    Report Status PENDING  Incomplete     Radiology Studies: DG Chest Portable 1 View  Result Date: 04/23/2019 CLINICAL DATA:  Pt to ED by [facility] due to family concerns over wound. Pt recently hospitalized for sepsis and discharged to [facility]. Pt sent home on Sat after antibiotic therapy at [facility]. Pt's daughter also reports pt had diarrhea over the .*comment was truncated*fever, ams, eval for infiltrate EXAM: PORTABLE CHEST 1 VIEW COMPARISON:  04/03/2019 FINDINGS: Normal cardiac silhouette. Mild increased airspace disease lung bases. No  focal infiltrate. No pleural fluid. No pneumothorax. IMPRESSION: Subtle bibasilar airspace disease could represent pulmonary edema or viral pneumonia. Electronically Signed   By: Suzy Bouchard M.D.   On: 04/23/2019 12:53    Scheduled Meds: . acidophilus  1 capsule Oral TID  . ascorbic acid  500 mg Oral BID  . aspirin EC  81 mg Oral Daily  . atorvastatin  40 mg Oral q1800  . busPIRone  10 mg Oral BID  . collagenase   Topical BID  . enoxaparin (LOVENOX) injection  30 mg Subcutaneous Q24H  . insulin aspart  0-5 Units Subcutaneous QHS  . insulin aspart  0-9 Units Subcutaneous TID WC  . multivitamin with minerals  1 tablet Oral Daily  . nicotine  21 mg Transdermal Daily  . pantoprazole  40 mg Oral Daily  .  sodium zirconium cyclosilicate  10 g Oral Daily   Continuous Infusions: . sodium chloride 75 mL/hr at 04/24/19 0629  . cefTRIAXone (ROCEPHIN)  IV Stopped (04/23/19 1929)     LOS: 1 day   Time spent: 40 minutes  Lorella Nimrod, MD [facility]  If 7PM-7AM, please contact night-coverage [email or web address]  04/24/2019, 7:41 AM   This record has been created using Systems analyst. Errors have been sought and corrected,but may not always be located. Such creation errors do not reflect on the standard of care.

## 2019-04-24 NOTE — Consult Note (Addendum)
Subjective:   CC: Sacral decub ulcer  HPI:  Miranda Ryan is a 74 y.o. female who was consulted by Miranda Ryan for evaluation of above.  Patient was previously hospitalized for sepsis and at that time was thought to be from a infected sacral wound.  Bedside debridement was performed and culture sent.  Patient finished her round of antibiotics but came back to the hospital due to change in mental status along with diarrhea.  Reexamination of the wound showed concerns of worsening wound and possible reinfection.  HPI obtained from chart checking and discussing with daughter who is primary caretaker.  Patient is nonverbal and can only answer questions with simple nods of yes and no.   Past Medical History:  has a past medical history of Acute CVA (cerebrovascular accident) ([place]) (02/22/2019), Acute exacerbation of chronic obstructive pulmonary disease (COPD) ([place]) (04/20/2016), Anxiety, Bipolar disorder ([place]), Dementia ([place]), Depression, Diabetes mellitus, type II ([place]), Dysphagia (02/23/2019), Incontinence of urine, Low serum thyroid stimulating hormone (TSH) (02/23/2019), and Severe mitral valve stenosis (02/23/2019).  Past Surgical History:  has a past surgical history that includes Cervix surgery.  Family History: family history includes Heart attack in her father; Heart disease in her brother, brother, brother, and sister; Liver cancer in her mother.  Social History:  reports that she has been smoking cigarettes. She started smoking about 54 years ago. She has a 46.00 pack-year smoking history. She has never used smokeless tobacco. She reports previous alcohol use. She reports that she does not use drugs.  Current Medications:  Medications Prior to Admission  Medication Sig Dispense Refill  . acetaminophen (TYLENOL) 325 MG tablet Take 2 tablets (650 mg total) by mouth every 6 (six) hours as needed for mild pain (or Fever >/= 101).    Marland Kitchen acidophilus (RISAQUAD) CAPS capsule Take 1 capsule by mouth 3 (three)  times daily.    Marland Kitchen ascorbic acid (VITAMIN C) 500 MG tablet Take 1 tablet (500 mg total) by mouth 2 (two) times daily.    Marland Kitchen aspirin EC 81 MG EC tablet Take 1 tablet (81 mg total) by mouth daily.    Marland Kitchen atorvastatin (LIPITOR) 40 MG tablet Take 1 tablet (40 mg total) by mouth daily at 6 PM.    . busPIRone (BUSPAR) 10 MG tablet Take 10 mg by mouth 2 (two) times daily.    . collagenase (SANTYL) ointment Apply topically 2 (two) times daily. 15 g 0  . ibuprofen (ADVIL) 200 MG tablet Take 200 mg by mouth every 6 (six) hours as needed.    . linagliptin (TRADJENTA) 5 MG TABS tablet Take 5 mg by mouth daily.    . metFORMIN (GLUCOPHAGE) 500 MG tablet Take 1 tablet (500 mg total) by mouth 2 (two) times daily with a meal. 60 tablet 11  . Multiple Vitamin (MULTIVITAMIN WITH MINERALS) TABS tablet Take 1 tablet by mouth daily.    . pantoprazole (PROTONIX) 40 MG tablet Take 1 tablet (40 mg total) by mouth daily.    . polyvinyl alcohol (LIQUIFILM TEARS) 1.4 % ophthalmic solution Place 1 drop into both eyes 4 (four) times daily as needed for dry eyes. 15 mL 0  . potassium chloride SA (KLOR-CON) 20 MEQ tablet Take 2 tablets (40 mEq total) by mouth daily for 1 day. (Patient taking differently: Take 20 mEq by mouth 2 (two) times daily. ) 2 tablet 0  . Amino Acids-Protein Hydrolys (FEEDING SUPPLEMENT, PRO-STAT SUGAR FREE 64,) LIQD Take 30 mLs by mouth daily.    Marland Kitchen  zinc sulfate 220 (50 Zn) MG capsule Take 220 mg by mouth daily.      Allergies:  Allergies  Allergen Reactions  . Demerol [Meperidine] Other (See Comments)    Pt states she "turned green"     ROS:  Unable to obtain secondary to patient mentation.   Objective:     BP 132/72 (BP Location: Right Arm)   Pulse 68   Temp 98.7 F (37.1 C) (Oral)   Resp 18   Ht 5\' 3"  (1.6 m)   Wt 52.6 kg   SpO2 98%   BMI 20.55 kg/m   Constitutional :  alert, appears stated age and no distress  Lymphatics/Throat:  no asymmetry, masses, or scars  Respiratory:  clear  to auscultation bilaterally  Cardiovascular:  regular rate and rhythm  Gastrointestinal: soft, non-tender; bowel sounds normal; no masses,  no organomegaly.  Musculoskeletal: Steady gait and movement  Skin: Cool and moist.  Stage IV sacral decubitus ulcer with obviously palpable sacrum at base.  Surrounding skin discoloration but no obvious purulent drainage and or smell.  Please see PIC below for further details.  Psychiatric: Normal affect, non-agitated, not confused         LABS:  CMP Latest Ref Rng & Units 04/24/2019 04/23/2019 04/12/2019  Glucose 70 - 99 mg/dL 81 100(H) 189(H)  BUN 8 - 23 mg/dL 22 23 20   Creatinine 0.44 - 1.00 mg/dL 1.19(H) 1.39(H) 0.92  Sodium 135 - 145 mmol/L 140 141 139  Potassium 3.5 - 5.1 mmol/L 5.2(H) 6.1(H) 3.5  Chloride 98 - 111 mmol/L 108 106 97(L)  CO2 22 - 32 mmol/L 22 25 34(H)  Calcium 8.9 - 10.3 mg/dL 7.6(L) 8.0(L) 7.5(L)  Total Protein 6.5 - 8.1 g/dL - 6.0(L) -  Total Bilirubin 0.3 - 1.2 mg/dL - 0.6 -  Alkaline Phos 38 - 126 U/L - 117 -  AST 15 - 41 U/L - 23 -  ALT 0 - 44 U/L - 16 -   CBC Latest Ref Rng & Units 04/24/2019 04/23/2019 04/11/2019  WBC 4.0 - 10.5 K/uL 8.8 10.7(H) 10.2  Hemoglobin 12.0 - 15.0 g/dL 7.8(L) 7.8(L) 8.0(L)  Hematocrit 36.0 - 46.0 % 26.6(L) 26.1(L) 25.4(L)  Platelets 150 - 400 K/uL 190 246 287    RADS: n/a  Assessment:     Stage IV sacral ulcer CVA Sepsis Acute kidney injury     Plan:     Had extensive discussion with daughter regarding plans moving forward.  I explained to her that her kidney functions are improving with fluid resuscitation.  I explained to her that her mother's sacral wound can be debrided in the OR but will likely require extensive wound care for many months if not years based on her current medical condition.  Alternative is to continue local wound care without debridement in the operating room.  With her current state of the wound and health, I believe the wound is not the major factor in her  presenting symptoms.  I explained to the daughter that the amount of wound care needed with or without formal debridement in the OR will be equivalent.  I also explained to her that taking her to the operating will will require temporarily pausing her DNR request.  We also discussed the pending MRI pelvis ordered which can indicate possible osteomyelitis, and if there are concerns for osteomyelitis debridement of the surrounding soft tissue itself will likely not cure or change the overall prognosis of her mother.  After discussing the above, the  daughter will like to hold off on any operative intervention until the MRI results come back.  If MRI shows obvious signs of osteomyelitis, she will like to hold off on any aggressive debridement in the operating room and try local wound care first.  If MRI is inconclusive or negative for concerns for osteomyelitis, will proceed with debridement in the OR of the soft tissue to definitively say the soft tissue infection is not the cause of her overall health status.  Plan was forwarded to the admitting hospitalist and she verbalized understanding as well.  Okay to continue baby aspirin and antibiotics in the meantime.

## 2019-04-24 NOTE — Progress Notes (Signed)
Initial Nutrition Assessment  DOCUMENTATION CODES:   Non-severe (moderate) malnutrition in context of chronic illness  INTERVENTION:   Ensure Enlive po BID, each supplement provides 350 kcal and 20 grams of protein  MVI daily   Vitamin C 500mg  po BID  NUTRITION DIAGNOSIS:   Moderate Malnutrition related to chronic illness(COPD, dementia) as evidenced by moderate fat depletion, moderate muscle depletion.  GOAL:   Patient will meet greater than or equal to 90% of their needs  MONITOR:   PO intake, Supplement acceptance, Labs, Weight trends, Skin, I & O's  REASON FOR ASSESSMENT:   Malnutrition Screening Tool    ASSESSMENT:   74 y.o. female with medical history significant of hyperlipidemia, diabetes mellitus, COPD, stroke, GERD, depression, anxiety, severe mitral valve stenosis, dementia, bipolar, tobacco abuse, CKD-3, recent admission due to septic shock secondary to bacteremia, who presents with diarrhea and fever.   RD familiar with this patient from recent previous admit. Pt with fairly good appetite and oral intake in hospital. Pt ate 100% of her breakfast today. RD will add supplements to help pt meet her estimated needs. Per chart, pt has lost 19lbs(14%) over the past 2 months; this is significant.    Medications reviewed and include: risaquad, vitamin C, aspirin, lovenox, insulin, MVI, nicotine, protonix, lokelma, NaCl @75ml /hr, ceftriaxone   Labs reviewed: K 5.2(H), creat 1.19(H) Hgb 7.8(L), Hct 26.6(L) cbgs- 84, 98, 63 x 24 hrs  Nutrition-Focused physical exam completed. Findings are moderate fat depletions, moderate muscle depletions, and no edema.   Diet Order:   Diet Order            DIET DYS 2 Room service appropriate? No; Fluid consistency: Thin  Diet effective now             EDUCATION NEEDS:   No education needs have been identified at this time  Skin:  Skin Assessment: Reviewed RN Assessment(Right hip with deep tissue pressure injury; 2X2cm,  Sacrum 5X5X1.2cm)  Last BM:  unknown  Height:   Ht Readings from Last 1 Encounters:  04/23/19 5\' 3"  (1.6 m)    Weight:   Wt Readings from Last 1 Encounters:  04/23/19 52.6 kg    Ideal Body Weight:  52.3 kg  BMI:  Body mass index is 20.55 kg/m.  Estimated Nutritional Needs:   Kcal:  1400-1600kcal/day  Protein:  70-80g/day  Fluid:  1.4-1.6L/day  Koleen Distance MS, RD, LDN Contact information available in Amion

## 2019-04-24 NOTE — Evaluation (Addendum)
Physical Therapy Evaluation Patient Details Name: Miranda Ryan MRN: [id number] DOB: [birth year] Today's Date: 04/24/2019   History of Present Illness  Pt is a 74 y.o. female with medical history significant of hyperlipidemia, diabetes mellitus, COPD, stroke, GERD, depression, anxiety, severe mitral valve stenosis, dementia, bipolar, tobacco abuse, CKD-3, recent admission due to septic shock secondary to bacteremia, who presents with diarrhea, fever, and hyperkalemia  Clinical Impression   Pt was able to follow basic one-step commands but ultimately was not able to participate with physical therapy very much. Pt was able to achieve upright sitting posture with UE assist but was unable to hold the position for more than a few minutes at a time and showed decreased safety awareness by beginning to fall to one side without any attempt to catch herself. Pt recently discharged home from SNF and per pt's daughter, pt was able to assist a little bit with bed mobility and transfers but the staff did most of the work. At home, pt was dependant for all ADL's and mobility however, pt was able to amb short distances with therapy in early January of 2021 during recent prior admission. Pt will benefit from HHPT services upon discharge to safely address deficits listed in patient problem list for decreased caregiver assistance and eventual return to recent PLOF that included limited ambulation.        Follow Up Recommendations Home health PT    Equipment Recommendations  None recommended by PT    Recommendations for Other Services       Precautions / Restrictions Precautions Precautions: Fall Restrictions Weight Bearing Restrictions: No      Mobility  Bed Mobility Overal bed mobility: Needs Assistance Bed Mobility: Supine to Sit;Sit to Supine;Rolling Rolling: Mod assist;Max assist(Able to help some by reaching for bed-side rail. Mod-max assist neccessary to achieve side-lying)   Supine to sit: Max  assist Sit to supine: Max assist   General bed mobility comments: Pt showed understanding of tasks by reaching for bed side rails when told to sit-up but ultimatley was unable to assist much with her LE or her trunk.  Transfers                 General transfer comment: Pt was unable to support trunk when trying to initate the transfer. Unsafe to attempt at this time  Ambulation/Gait                Stairs            Wheelchair Mobility    Modified Rankin (Stroke Patients Only)       Balance Overall balance assessment: Needs assistance   Sitting balance-Leahy Scale: Poor Sitting balance - Comments: Pt was able to st EOB without UE support but without warning, would occasionaly begin to fall to one side with no attempt to catch herself. Postural control: Left lateral lean                                   Pertinent Vitals/Pain Pain Assessment: (Pt did not answer pain questions)    Home Living Family/patient expects to be discharged to:: Private residence Living Arrangements: Children Available Help at Discharge: Family;Available 24 hours/day Type of Home: House Home Access: Stairs to enter Entrance Stairs-Rails: None Entrance Stairs-Number of Steps: 1 Home Layout: One level Home Equipment: Walker - 2 wheels;Cane - single point;Other (comment) Additional Comments: some hx from chart review, some hx from  daughter. Per daughter, pt has access to a [employer] fast streamer    Prior Function Level of Independence: Needs assistance   Gait / Transfers Assistance Needed: Per daughter: at [facility], pt would assist with transfers but staff did most of the work. After discharge from SNF, pt was dependent level for transfers  ADL's / Homemaking Assistance Needed: Per daughter: pt is fully dependent  Comments: Per daughter: When pt got home from SNF, pt was total assists for all transfers and was fully dependent for all ADL's     Hand Dominance         Extremity/Trunk Assessment   Upper Extremity Assessment Upper Extremity Assessment: Generalized weakness    Lower Extremity Assessment Lower Extremity Assessment: Generalized weakness       Communication   Communication: Other (comment)(Unclear, pt did not answer most questions)  Cognition Arousal/Alertness: Lethargic Behavior During Therapy: Flat affect Overall Cognitive Status: No family/caregiver present to determine baseline cognitive functioning                                 General Comments: Pt did not respond to most questions but showed understanding with basic one-step commands      General Comments      Exercises Total Joint Exercises Ankle Circles/Pumps: (Pt did not attempt despite verbal cueing and PROM demonstration) Other Exercises Other Exercises: EOB sitting balance Other Exercises: Rolling trailing   Assessment/Plan    PT Assessment Patient needs continued PT services  PT Problem List Decreased strength;Decreased mobility;Decreased safety awareness;Decreased range of motion;Decreased activity tolerance;Decreased cognition;Decreased skin integrity;Decreased balance;Decreased knowledge of use of DME       PT Treatment Interventions      PT Goals (Current goals can be found in the Care Plan section)       Frequency 2x/week   Barriers to discharge        Co-evaluation               AM-PAC PT "6 Clicks" Mobility  Outcome Measure Help needed turning from your back to your side while in a flat bed without using bedrails?: A Lot Help needed moving from lying on your back to sitting on the side of a flat bed without using bedrails?: A Lot Help needed moving to and from a bed to a chair (including a wheelchair)?: Total Help needed standing up from a chair using your arms (e.g., wheelchair or bedside chair)?: Total Help needed to walk in hospital room?: Total Help needed climbing 3-5 steps with a railing? : Total 6 Click Score:  8    End of Session Equipment Utilized During Treatment: Gait belt Activity Tolerance: Other (comment)(Pt limited for unclear reasons. Cognition, fatigue, and lethragy are all possibe in addition to pt being at her baseline which was difficult to assess given pt being a poor historiana nd family not present) Patient left: in bed;with call bell/phone within reach;with bed alarm set;with SCD's reapplied Nurse Communication: Mobility status PT Visit Diagnosis: Unsteadiness on feet (R26.81);Muscle weakness (generalized) (M62.81)    Time: 1000-1032 PT Time Calculation (min) (ACUTE ONLY): 32 min   Charges:              Annabelle Harman, SPT 04/24/19 1:34 PM

## 2019-04-24 NOTE — Clinical Social Work Note (Addendum)
Patient is active with [facility] for PT, OT, RN, and aide services. Will follow for discharge needs.  Dayton Scrape, [place]  2:51 pm Received call from Dr. Lysle Pearl. He spoke with patient's daughter who stated that [facility] has been unable to see her yet because she does not have an established PCP. CSW called and confirmed with [facility]. Patient had an appt with Dr. Ola Spurr while at [facility] but they cancelled it, not realizing this appt was to establish care. Once we have a better idea of discharge date, will call to see if we can get appt re-scheduled.  Dayton Scrape, [place]

## 2019-04-24 NOTE — Consult Note (Addendum)
[facility] Nurse Consult Note: Reason for Consult: Consult requested for sacrum wound. Wound type: Chronic stage 4 pressure injury; surgical team performed debridement during a previus admission on 2/10.  Wound has not improved in appearance.  Bilat heels had deep tissue pressure injuries during previous admission, these have resolved and healed.   Right hip with deep tissue pressure injury; 2X2cm dark red-purple.  Foam dressing to protet from further injury Pressure Injury POA: Yes Measurement: Sacrum 5X5X1.2cm, 95% black, 5% red; bone palpable with swab Drainage (amount, consistency, odor) mod amt tan drainage, no odor Periwound: intact skin surrounding Dressing procedure/placement/frequency:  Topical treatment orders provided for bedside nurse to perform to assist with enzymatic debridment: Apply Santyl then moist gauze to sacrum Q day, then cover with foam dressing.  (Change foam dressing Q 3 days or PRN soiling.)  Float heels to reduce pressure. Foam dressing to protect right hip. Please re-consult if further assistance is needed.  Thank-you,  Julien Girt MSN, [place], [place], [place], [place]

## 2019-04-25 LAB — CBC
HCT: 21.2 % — ABNORMAL LOW (ref 36.0–46.0)
Hemoglobin: 6 g/dL — ABNORMAL LOW (ref 12.0–15.0)
MCH: 26.7 pg (ref 26.0–34.0)
MCHC: 28.3 g/dL — ABNORMAL LOW (ref 30.0–36.0)
MCV: 94.2 fL (ref 80.0–100.0)
Platelets: 244 10*3/uL (ref 150–400)
RBC: 2.25 MIL/uL — ABNORMAL LOW (ref 3.87–5.11)
RDW: 23.4 % — ABNORMAL HIGH (ref 11.5–15.5)
WBC: 8.1 10*3/uL (ref 4.0–10.5)
nRBC: 0 % (ref 0.0–0.2)

## 2019-04-25 LAB — GLUCOSE, CAPILLARY
Glucose-Capillary: 102 mg/dL — ABNORMAL HIGH (ref 70–99)
Glucose-Capillary: 167 mg/dL — ABNORMAL HIGH (ref 70–99)
Glucose-Capillary: 229 mg/dL — ABNORMAL HIGH (ref 70–99)
Glucose-Capillary: 133 mg/dL — ABNORMAL HIGH (ref 70–99)

## 2019-04-25 LAB — ABO/RH: ABO/RH(D): A POS

## 2019-04-25 LAB — BASIC METABOLIC PANEL
Anion gap: 9 (ref 5–15)
BUN: 20 mg/dL (ref 8–23)
CO2: 20 mmol/L — ABNORMAL LOW (ref 22–32)
Calcium: 7.2 mg/dL — ABNORMAL LOW (ref 8.9–10.3)
Chloride: 108 mmol/L (ref 98–111)
Creatinine, Ser: 1.18 mg/dL — ABNORMAL HIGH (ref 0.44–1.00)
GFR calc Af Amer: 53 mL/min — ABNORMAL LOW (ref >60)
GFR calc non Af Amer: 45 mL/min — ABNORMAL LOW (ref >60)
Glucose, Bld: 114 mg/dL — ABNORMAL HIGH (ref 70–99)
Potassium: 3.6 mmol/L (ref 3.5–5.1)
Sodium: 137 mmol/L (ref 135–145)

## 2019-04-25 LAB — PREPARE RBC (CROSSMATCH)

## 2019-04-25 LAB — HEMOGLOBIN AND HEMATOCRIT, BLOOD
HCT: 30.8 % — ABNORMAL LOW (ref 36.0–46.0)
Hemoglobin: 9.2 g/dL — ABNORMAL LOW (ref 12.0–15.0)

## 2019-04-25 MED ORDER — TRAMADOL HCL 50 MG PO TABS
50.0000 mg | ORAL_TABLET | Freq: Four times a day (QID) | ORAL | Status: DC | PRN
  Administered 2019-04-25: 50 mg via ORAL
  Filled 2019-04-25: qty 1

## 2019-04-25 MED ORDER — SODIUM CHLORIDE 0.9 % IV SOLN: INTRAVENOUS | Status: DC

## 2019-04-25 MED ORDER — SODIUM CHLORIDE 0.9% IV SOLUTION: Freq: Once | INTRAVENOUS | Status: AC

## 2019-04-25 MED ORDER — METOPROLOL TARTRATE 25 MG PO TABS
25.0000 mg | ORAL_TABLET | Freq: Two times a day (BID) | ORAL | Status: DC
  Administered 2019-04-25 – 2019-04-27 (×5): 25 mg via ORAL
  Filled 2019-04-25 (×5): qty 1

## 2019-04-25 NOTE — H&P (View-Only) (Signed)
ADDENDUM:  Noted MRI results of no obvious osteomyelitis.  As previously discussed with daughter, will proceed with debridement so there is no concern of wound as major cause of her current symptoms.  I explained to daughter that even with debridement, patient will require extensive wound care and this will take months to heal, if it ever heals.  Daughter verbalized understanding and still wishes to proceed.  She also is agreeable to transfusion for the low hgb.    Debridement scheduled for tomorrow

## 2019-04-25 NOTE — Progress Notes (Addendum)
PROGRESS NOTE  Miranda Ryan [id number] DOB: 1945-04-20 DOA: 04/23/2019 PCP: [facility]  Brief History   Miranda Ryan is a 74 y.o. female with medical history significant of hyperlipidemia, diabetes mellitus, COPD, stroke, GERD, depression, anxiety, severe mitral valve stenosis, dementia, bipolar, tobacco abuse, CKD-3, recent admission due to septic shock secondary to Enterococcus faecalis bacteremia, who presents with diarrhea and fever.  Completed course of Unasyn on 04/19/2019.  Also found to be hyperkalemic, AKI and worsening of of her sacral wounds.  The patient has been admitted to a telemetry bed. Consults have been obtained from general surgery, infectious disease, and wound care. Pt will go for debridement of sacral wound tomorrow. She is receiving IV Ceftriaxone after being transitioned from Unasyn.  Consultants  . Wound care . Infectious disease . General surgery  Procedures  . Transfusion 1 unit PRBC's.  Antibiotics   . Anti-infectives (From admission, onward)   .  Marland Kitchen Start .   .   Zada Girt . Route . Frequency . Ordered . Stop  .  Marland Kitchen 04/23/19 1900 .  Marland Kitchen cefTRIAXone (ROCEPHIN) 1 g in sodium chloride 0.9 % 100 mL IVPB   .   . 1 g . 200 mL/hr over 30 Minutes . Intravenous . Every 24 hours . 04/23/19 1830 .     .   .  Subjective  The patient is resting comfortably. No new complaints.  Objective   Vitals:  Vitals:   04/25/19 1311 04/25/19 1341  BP: 95/73 115/74  Pulse: 89 97  Resp: 18 18  Temp: (!) 97.3 F (36.3 C) 97.6 F (36.4 C)  SpO2: 92% 93%   Exam:  Constitutional:  . The patient is awake, alert, and oriented x 3. No acute distress. Respiratory:  . No increased work of breathing. . No wheezes, rales, or rhonchi . No tactile fremitus Cardiovascular:  . Regular rate and rhythm . No murmurs, ectopy, or gallups. . No lateral PMI. No thrills. Abdomen:  . Abdomen is soft, non-tender, non-distended . No hernias, masses, or  organomegaly . Normoactive bowel sounds.  Musculoskeletal:  . No cyanosis, clubbing, or edema Skin:  . No rashes, lesions, ulcers . palpation of skin: no induration or nodules Neurologic:  . CN 2-12 intact . Sensation all 4 extremities intact  I have personally reviewed the following:   Today's Data  . Vitals, CBC, BMP  Micro Data  . Blood cultures x 2 - No growth x 3 days.  Imaging  . MRI Pelvis without contrast.  Cardiology Data  . EKG (04/25/19): Sinus tachycardia  Scheduled Meds: . acidophilus  1 capsule Oral TID  . ascorbic acid  500 mg Oral BID  . atorvastatin  40 mg Oral q1800  . busPIRone  10 mg Oral BID  . collagenase   Topical Daily  . feeding supplement (ENSURE ENLIVE)  237 mL Oral BID BM  . insulin aspart  0-5 Units Subcutaneous QHS  . insulin aspart  0-9 Units Subcutaneous TID WC  . metoprolol tartrate  25 mg Oral BID  . multivitamin with minerals  1 tablet Oral Daily  . nicotine  21 mg Transdermal Daily  . pantoprazole  40 mg Oral Daily   Continuous Infusions: . sodium chloride 75 mL/hr at 04/25/19 0635  . [START ON 04/26/2019] sodium chloride    . cefTRIAXone (ROCEPHIN)  IV Stopped (04/24/19 1849)    Principal Problem:   Hyperkalemia Active Problems:   Bacteremia   Stroke ([place])  HLD (hyperlipidemia)   COPD (chronic obstructive pulmonary disease) (HCC)   GERD (gastroesophageal reflux disease)   Depression   CKD (chronic kidney disease), stage IIIa   Diarrhea   Sacral decubitus ulcer, stage III (HCC)   Normocytic anemia   Sepsis (HCC)   LOS: 2 days   A & P  Principal Problem:   Hyperkalemia Active Problems:   Bacteremia   Stroke (HCC)   HLD (hyperlipidemia)   COPD (chronic obstructive pulmonary disease) (HCC)   GERD (gastroesophageal reflux disease)   Depression   CKD (chronic kidney disease), stage IIIa   Diarrhea   Sacral decubitus ulcer, stage III (HCC)   Normocytic anemia   Sepsis (HCC)   Hyperkalemia:  Potassium improved to  3.6this morning from 6.1 upon admission. Monitor.   Recent Aerococcus faecalis bacteremia and suspected sepsis: Pt continues to have sinus tachycardia, but no hypotension or tachypnea. Sinus tachycardia appears to have resolved with metoprolol. Leukocytosis is resolved. Continue lactic acidosis is likely due to anemia as opposed to sepsis. One unit of PRBC's has been ordered for her. Surveillance blood cultures remain negative. She is receiving IV ceftriaxone.  Sacral decubitus ulcer, stage IV ([place]): Worsening of her sacral decubitus ulcer which is now reaching to bone now.  Concern for osteomyelitis, however MRI does not demonstrate this. 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 Plan is for the patient to go for debridement with surgery tomorrow.    CKD (chronic kidney disease), stage IIIa:  Creatinine improved to 1.18 today with fluids. Continue to monitor renal function. Avoid nephrotoxins and hypotension. Monitor electrolytes, creatinine and volume status.   History of stroke with poor functional status:  Patient is basically bed bound and completely dependent for her ADLs.  Palliative care consult was placed to discuss goals of care. However, her daughter does not want to involve palliative, she was very upset with them when we involved them during previous hospitalization. Continue aspirin and Lipitor.   COPD (chronic obstructive pulmonary disease) ([place]): Stable. Continue as needed albuterol.   GERD (gastroesophageal reflux disease): Protonix   Depression: Continue home medications   Normocytic anemia: Hemoglobin dropped to 6.0 this morning. The patient is being transfused with one unit of PRBC's. Continue to monitor.  Moderate malnutrition: Nutrition consulted.  I have seen and examined this patient myself. I have spent 33 minutes in her evaluation and care.  DVT prophylaxis: Lovenox Code Status: DNR Family Communication: General Surgery communicated with the patient's daughter today regarding  planned procedure.  Disposition Plan: Pending clinical improvement  Darrell Leonhardt, DO [facility] Direct contact: see [email or web address]  7PM-7AM contact night coverage as above 04/25/2019, 2:29 PM  LOS: 2 days

## 2019-04-25 NOTE — Progress Notes (Signed)
Stat EKG was obtained and seen by provider and placed in patient's chart.

## 2019-04-25 NOTE — Progress Notes (Signed)
Nurse reports hemoglobin of 6.0 Called patient daughter to discuss risk/benefit of PRBC transfusion Daughter expressed she would prefer to wait to talk to care team regarding plan once MRI results are reported.  She is considering hospice/comfort care if osteomyelitis present on MRI.( I did not see MRI results while she was on the phone and findings not addressed). At this time she would not like transfusion initiated unless she would become hemodynamically unstable.  Patient without signs of active bleed and hemodynamically stable

## 2019-04-25 NOTE — Progress Notes (Signed)
ADDENDUM:  Noted MRI results of no obvious osteomyelitis.  As previously discussed with daughter, will proceed with debridement so there is no concern of wound as major cause of her current symptoms.  I explained to daughter that even with debridement, patient will require extensive wound care and this will take months to heal, if it ever heals.  Daughter verbalized understanding and still wishes to proceed.  She also is agreeable to transfusion for the low hgb.    Debridement scheduled for tomorrow

## 2019-04-25 NOTE — Progress Notes (Signed)
PT Cancellation Note  Patient Details Name: Miranda Ryan MRN: [id number] DOB: 09-08-1945   Cancelled Treatment:    Reason Eval/Treat Not Completed: Medical issues which prohibited therapy: Pt's Hgb 6.0 and trending down falling outside guidelines for participation with PT services.  Will attempt to see pt at a future date/time as medically appropriate.     Linus Salmons PT, DPT 04/25/19, 9:24 AM

## 2019-04-26 ENCOUNTER — Encounter
Admission: EM | Disposition: A | Discharge status: Home Health | Payer: Self-pay | Source: Home / Self Care | Attending: Internal Medicine

## 2019-04-26 ENCOUNTER — Inpatient Hospital Stay: Payer: Medicare Other | Admitting: Certified Registered Nurse Anesthetist

## 2019-04-26 ENCOUNTER — Encounter: Payer: Self-pay | Admitting: Internal Medicine

## 2019-04-26 HISTORY — PX: APPLICATION OF WOUND VAC: SHX5189

## 2019-04-26 HISTORY — PX: INCISION AND DRAINAGE OF WOUND: SHX1803

## 2019-04-26 LAB — BASIC METABOLIC PANEL
Anion gap: 8 (ref 5–15)
BUN: 24 mg/dL — ABNORMAL HIGH (ref 8–23)
CO2: 23 mmol/L (ref 22–32)
Calcium: 7.3 mg/dL — ABNORMAL LOW (ref 8.9–10.3)
Chloride: 104 mmol/L (ref 98–111)
Creatinine, Ser: 1.06 mg/dL — ABNORMAL HIGH (ref 0.44–1.00)
GFR calc Af Amer: 60 mL/min — ABNORMAL LOW (ref >60)
GFR calc non Af Amer: 52 mL/min — ABNORMAL LOW (ref >60)
Glucose, Bld: 139 mg/dL — ABNORMAL HIGH (ref 70–99)
Potassium: 3.8 mmol/L (ref 3.5–5.1)
Sodium: 135 mmol/L (ref 135–145)

## 2019-04-26 LAB — CBC WITH DIFFERENTIAL/PLATELET
Abs Immature Granulocytes: 0.11 10*3/uL — ABNORMAL HIGH (ref 0.00–0.07)
Basophils Absolute: 0 10*3/uL (ref 0.0–0.1)
Basophils Relative: 0 %
Eosinophils Absolute: 0.1 10*3/uL (ref 0.0–0.5)
Eosinophils Relative: 1 %
HCT: 26.2 % — ABNORMAL LOW (ref 36.0–46.0)
Hemoglobin: 8 g/dL — ABNORMAL LOW (ref 12.0–15.0)
Immature Granulocytes: 1 %
Lymphocytes Relative: 16 %
Lymphs Abs: 1.5 10*3/uL (ref 0.7–4.0)
MCH: 27.8 pg (ref 26.0–34.0)
MCHC: 30.5 g/dL (ref 30.0–36.0)
MCV: 91 fL (ref 80.0–100.0)
Monocytes Absolute: 0.6 10*3/uL (ref 0.1–1.0)
Monocytes Relative: 7 %
Neutro Abs: 7.2 10*3/uL (ref 1.7–7.7)
Neutrophils Relative %: 75 %
Platelets: 251 10*3/uL (ref 150–400)
RBC: 2.88 MIL/uL — ABNORMAL LOW (ref 3.87–5.11)
RDW: 20.2 % — ABNORMAL HIGH (ref 11.5–15.5)
Smear Review: NORMAL
WBC: 9.6 10*3/uL (ref 4.0–10.5)
nRBC: 0 % (ref 0.0–0.2)

## 2019-04-26 LAB — TYPE AND SCREEN
ABO/RH(D): A POS
Antibody Screen: NEGATIVE
Unit division: 0

## 2019-04-26 LAB — GLUCOSE, CAPILLARY
Glucose-Capillary: 131 mg/dL — ABNORMAL HIGH (ref 70–99)
Glucose-Capillary: 120 mg/dL — ABNORMAL HIGH (ref 70–99)
Glucose-Capillary: 113 mg/dL — ABNORMAL HIGH (ref 70–99)
Glucose-Capillary: 99 mg/dL (ref 70–99)
Glucose-Capillary: 199 mg/dL — ABNORMAL HIGH (ref 70–99)

## 2019-04-26 LAB — BPAM RBC
Blood Product Expiration Date: 202103302359
ISSUE DATE / TIME: 202103031317
Unit Type and Rh: 6200

## 2019-04-26 SURGERY — IRRIGATION AND DEBRIDEMENT WOUND
Anesthesia: Monitor Anesthesia Care

## 2019-04-26 MED ORDER — PROPOFOL 500 MG/50ML IV EMUL
INTRAVENOUS | Status: DC | PRN
  Administered 2019-04-26: 20 ug/kg/min via INTRAVENOUS

## 2019-04-26 MED ORDER — DEXMEDETOMIDINE HCL 200 MCG/2ML IV SOLN
INTRAVENOUS | Status: DC | PRN
  Administered 2019-04-26: 12 ug via INTRAVENOUS

## 2019-04-26 MED ORDER — FENTANYL CITRATE (PF) 100 MCG/2ML IJ SOLN
INTRAMUSCULAR | Status: AC
  Filled 2019-04-26: qty 2

## 2019-04-26 MED ORDER — SODIUM CHLORIDE 0.9 % IV SOLN
3.0000 g | Freq: Four times a day (QID) | INTRAVENOUS | Status: DC
  Administered 2019-04-26 – 2019-04-27 (×4): 3 g via INTRAVENOUS
  Filled 2019-04-26 (×4): qty 8
  Filled 2019-04-26 (×2): qty 3
  Filled 2019-04-26 (×3): qty 8

## 2019-04-26 MED ORDER — ACETAMINOPHEN 325 MG PO TABS: 325.0000 mg | ORAL_TABLET | ORAL | Status: DC | PRN

## 2019-04-26 MED ORDER — KETAMINE HCL 50 MG/ML IJ SOLN
INTRAMUSCULAR | Status: AC
  Filled 2019-04-26: qty 10

## 2019-04-26 MED ORDER — ACETAMINOPHEN 10 MG/ML IV SOLN
INTRAVENOUS | Status: AC
  Filled 2019-04-26: qty 100

## 2019-04-26 MED ORDER — FENTANYL CITRATE (PF) 100 MCG/2ML IJ SOLN: 25.0000 ug | INTRAMUSCULAR | Status: DC | PRN

## 2019-04-26 MED ORDER — ACETAMINOPHEN 160 MG/5ML PO SOLN
325.0000 mg | ORAL | Status: DC | PRN
  Filled 2019-04-26: qty 20.3

## 2019-04-26 MED ORDER — FENTANYL CITRATE (PF) 100 MCG/2ML IJ SOLN
INTRAMUSCULAR | Status: DC | PRN
  Administered 2019-04-26 (×2): 25 ug via INTRAVENOUS

## 2019-04-26 MED ORDER — LIDOCAINE HCL (PF) 2 % IJ SOLN
INTRAMUSCULAR | Status: AC
  Filled 2019-04-26: qty 5

## 2019-04-26 MED ORDER — ROCURONIUM BROMIDE 50 MG/5ML IV SOLN
INTRAVENOUS | Status: AC
  Filled 2019-04-26: qty 1

## 2019-04-26 MED ORDER — KETAMINE HCL 50 MG/ML IJ SOLN
INTRAMUSCULAR | Status: DC | PRN
  Administered 2019-04-26: 25 mg via INTRAMUSCULAR

## 2019-04-26 SURGICAL SUPPLY — 21 items
CANISTER SUCT 1200ML W/VALVE (MISCELLANEOUS) ×3 IMPLANT
CANISTER WOUND CARE 500ML ATS (WOUND CARE) ×2 IMPLANT
COVER WAND RF STERILE (DRAPES) ×3 IMPLANT
DRAPE LAPAROTOMY 100X77 ABD (DRAPES) ×3 IMPLANT
DRSG VAC ATS MED SENSATRAC (GAUZE/BANDAGES/DRESSINGS) ×3 IMPLANT
ELECT REM PT RETURN 9FT ADLT (ELECTROSURGICAL) ×3
ELECTRODE REM PT RTRN 9FT ADLT (ELECTROSURGICAL) ×1 IMPLANT
GLOVE BIOGEL PI IND STRL 6.5 (GLOVE) ×1 IMPLANT
GLOVE BIOGEL PI IND STRL 7.0 (GLOVE) ×1 IMPLANT
GLOVE BIOGEL PI INDICATOR 6.5 (GLOVE) ×2
GLOVE BIOGEL PI INDICATOR 7.0 (GLOVE) ×2
GLOVE SURG SYN 6.5 ES PF (GLOVE) ×3 IMPLANT
GLOVE SURG SYN 6.5 PF PI (GLOVE) ×1 IMPLANT
GOWN STRL REUS W/ TWL LRG LVL3 (GOWN DISPOSABLE) ×2 IMPLANT
GOWN STRL REUS W/TWL LRG LVL3 (GOWN DISPOSABLE) ×4
KIT TURNOVER KIT A (KITS) ×3 IMPLANT
PACK BASIN MINOR ARMC (MISCELLANEOUS) ×3 IMPLANT
SOL PREP PVP 2OZ (MISCELLANEOUS) ×3
SOLUTION PREP PVP 2OZ (MISCELLANEOUS) ×1 IMPLANT
SPONGE LAP 18X18 RF (DISPOSABLE) ×3 IMPLANT
SYR BULB IRRIG 60ML STRL (SYRINGE) ×2 IMPLANT

## 2019-04-26 NOTE — TOC Initial Note (Addendum)
Transition of Care [facility]) - Initial/Assessment Note    Patient Details  Name: Miranda Ryan MRN: [id number] Date of Birth: 24-Oct-1945  Transition of Care [facility]) CM/SW Contact:    Magnus Ivan, LCSW Phone Number: 04/26/2019, 2:48 PM  Clinical Narrative:                CSW completed readmission screen with patient's daughter [name]) via phone. [name] reported patient lives with [name] and Carmen's 75 y.o. daughter and husband. Patient has a hospital bed with a regular mattress, hoyer lift, and wheelchair in the home. [name] said she provides the majority of patient's care as her husband is a Administrator and out of town often. [name] provides transportation to appointments, but can only do this when her husband is home to help get patient in and out of the vehicle. Patient uses Holiday representative in [place] and [place] denied issues with obtaining patient's medications. [name] confirmed they have been trying to establish patient with Dr. Ola Spurr and [facility] for primary care. She reported she needs patient to have a PCP in place so that home health services can begin. She was previously set up with [facility] (after leaving [facility]), but they would not begin until patient was seen by a PCP. [name] was agreeable to [place] reaching out to [facility] about scheduling an appointment for post discharge. [name] reported she is open to any provider (as long as they are an MD) at [facility], but would need the appointment to be on a Monday (in the morning if possible). Informed [name] that we do have a Provider at the hospital who will sign Home Health orders short term as long as a PCP appointment is set up, [name] was ok with this. [name] stated she was not happy with [facility] and wants to use a different Home Health agency. CSW offered to provide list of agencies, [name] declined this list and stated she has no preference, just not [facility]. CSW inquired with Corene Cornea at [facility] on if they  can accept patient when she discharges from the hospital. CSW spoke with Equipment Representative Sunday Corn about patient needing a wound vac. Per Leroy Sea, patient will have to have a low air loss mattress prior to getting a wound vac. Brad said insurance prior approval for this mattress can take up to 10 days. CSW informed [name] and MD of this. Requested MD to place order for mattress. [name] had medical questions about patient and requested a call from the MD. CSW informed MD of request. CSW will continue to follow up.  3:45- Sent secure email to Dr. Doy Hutching to inquire if he would be willing to sign home health orders until patient can see a PCP.   Expected Discharge Plan: [place] Barriers to Discharge: Insurance Authorization, Continued Medical Work up   Patient Goals and CMS Choice     Choice offered to / list presented to : (Offered list to patient's daughter, she declined and said she has no preference.)  Expected Discharge Plan and Services Expected Discharge Plan: [place] Choice: [place] arrangements for the past 2 months: Single Family Home                 DME Arranged: Vac, Other see comment(low air loss mattress) DME Agency: [facility] Date DME Agency Contacted: 04/26/19 Time DME Agency Contacted: [phone] Representative spoke with at DME Agency: Sunday Corn  [place] Agency: [place] ([place]) Date [place]: 04/26/19 Time [place]: [id number] Representative spoke with at [place]: Floydene Flock  Prior Living Arrangements/Services Living arrangements for the past 2 months: [place] with:: Adult Children, Relatives Patient language and need for interpreter reviewed:: Yes Do you feel safe going back to the place where you live?: Yes      Need for Family Participation in Patient Care: Yes (Comment) Care giver support system in place?: Yes (comment) Current home  services: DME Criminal Activity/Legal Involvement Pertinent to Current Situation/Hospitalization: No - Comment as needed  Activities of Daily Living Home Assistive Devices/Equipment: [place] Hospital bed, Wheelchair ADL Screening (condition at time of admission) Patient's cognitive ability adequate to safely complete daily activities?: No Is the patient deaf or have difficulty hearing?: No Does the patient have difficulty seeing, even when wearing glasses/contacts?: No Does the patient have difficulty concentrating, remembering, or making decisions?: Yes Patient able to express need for assistance with ADLs?: Yes Does the patient have difficulty dressing or bathing?: Yes Independently performs ADLs?: No Communication: Independent Dressing (OT): Needs assistance Is this a change from baseline?: Pre-admission baseline Grooming: Needs assistance Is this a change from baseline?: Pre-admission baseline Feeding: Needs assistance Is this a change from baseline?: Pre-admission baseline Bathing: Needs assistance Is this a change from baseline?: Pre-admission baseline Toileting: Needs assistance Is this a change from baseline?: Pre-admission baseline In/Out Bed: Needs assistance Is this a change from baseline?: Pre-admission baseline Walks in Home: Dependent Is this a change from baseline?: Pre-admission baseline Does the patient have difficulty walking or climbing stairs?: Yes Weakness of Legs: Both Weakness of Arms/Hands: Both  Permission Sought/Granted         Permission granted to share info w AGENCY: Home Health agencies, [facility]        Emotional Assessment     Affect (typically observed): Unable to Assess   Alcohol / Substance Use: Not Applicable    Admission diagnosis:  Hyperkalemia [E87.5] Acute hyperkalemia [E87.5] AKI (acute kidney injury) ([place]) [N17.9] Patient Active Problem List   Diagnosis Date Noted  . Hyperkalemia 04/23/2019  . Stroke ([place])  04/23/2019  . HLD (hyperlipidemia) 04/23/2019  . COPD (chronic obstructive pulmonary disease) ([place]) 04/23/2019  . GERD (gastroesophageal reflux disease) 04/23/2019  . Depression 04/23/2019  . CKD (chronic kidney disease), stage IIIa 04/23/2019  . Diarrhea 04/23/2019  . Sacral decubitus ulcer, stage III ([place]) 04/23/2019  . Normocytic anemia 04/23/2019  . Sepsis ([place]) 04/23/2019  . Malnutrition of moderate degree 04/05/2019  . Goals of care, counseling/discussion   . Palliative care by specialist   . Encounter for hospice care discussion   . Pressure injury of skin 04/04/2019  . Bacteremia   . Septic shock ([place]) 04/03/2019  . Dysphagia 02/23/2019  . Low serum thyroid stimulating hormone (TSH) 02/23/2019  . Severe mitral valve stenosis 02/23/2019  . Acute CVA (cerebrovascular accident) ([place]) 02/22/2019  . Generalized weakness   . Suspected UTI   . Ischemic stroke diagnosed during current admission ([place]) 02/21/2019  . Acute exacerbation of chronic obstructive pulmonary disease (COPD) ([place]) 04/20/2016   PCP:  [place] [facility]:   [facility] [place], [place] - [address] [address] [place] [address] Phone: [phone] Fax: [phone]  [facility] [id number], [place] AT [facility] OF [address] [place] [address] Phone:  [phone] Fax: [phone]     Social Determinants of Health (SDOH) Interventions    Readmission Risk Interventions Readmission Risk Prevention Plan 04/26/2019  Transportation Screening Complete  PCP or Specialist Appt within 3-5 Days Complete  HRI or Home Care Consult Complete  Medication Review (RN Care Manager) Complete  Some recent data might be hidden

## 2019-04-26 NOTE — Anesthesia Preprocedure Evaluation (Signed)
Anesthesia Evaluation  Patient identified by MRN, date of birth, ID band Patient awake    Reviewed: Allergy & Precautions, H&P , NPO status , reviewed documented beta blocker date and time   Airway Mallampati: II  TM Distance: >3 FB Neck ROM: full    Dental  (+) Edentulous Upper, Edentulous Lower   Pulmonary COPD, Current Smoker and Patient abstained from smoking.,    Pulmonary exam normal        Cardiovascular + Past MI  Normal cardiovascular exam     Neuro/Psych PSYCHIATRIC DISORDERS Anxiety Depression Bipolar Disorder Dementia Bed ridden CVA, Residual Symptoms    GI/Hepatic GERD  Medicated and Controlled,  Endo/Other  diabetes  Renal/GU Renal disease     Musculoskeletal   Abdominal   Peds  Hematology  (+) Blood dyscrasia, anemia ,   Anesthesia Other Findings Past Medical History: 02/22/2019: Acute CVA (cerebrovascular accident) ([place]) 04/20/2016: Acute exacerbation of chronic obstructive pulmonary  disease (COPD) ([place]) No date: Anxiety No date: Bipolar disorder ([place]) No date: Dementia ([place]) No date: Depression No date: Diabetes mellitus, type II ([place]) 02/23/2019: Dysphagia No date: Incontinence of urine 02/23/2019: Low serum thyroid stimulating hormone (TSH) 02/23/2019: Severe mitral valve stenosis  Past Surgical History: No date: CERVIX SURGERY  BMI    Body Mass Index: 20.55 kg/m      Reproductive/Obstetrics                             Anesthesia Physical Anesthesia Plan  ASA: IV  Anesthesia Plan: MAC   Post-op Pain Management:    Induction: Intravenous  PONV Risk Score and Plan: Ondansetron, TIVA and Treatment may vary due to age or medical condition  Airway Management Planned: Nasal Cannula and Natural Airway  Additional Equipment:   Intra-op Plan:   Post-operative Plan:   Informed Consent: I have reviewed the patients History and Physical, chart, labs and  discussed the procedure including the risks, benefits and alternatives for the proposed anesthesia with the patient or authorized representative who has indicated his/her understanding and acceptance.     Dental Advisory Given  Plan Discussed with: CRNA  Anesthesia Plan Comments:         Anesthesia Quick Evaluation

## 2019-04-26 NOTE — Transfer of Care (Signed)
Immediate Anesthesia Transfer of Care Note  Patient: Miranda Ryan  Procedure(s) Performed: IRRIGATION AND DEBRIDEMENT WOUND (N/A ) APPLICATION OF WOUND VAC (N/A )  Patient Location: PACU  Anesthesia Type:General  Level of Consciousness: awake and confused  Airway & Oxygen Therapy: Patient Spontanous Breathing and Patient connected to face mask oxygen  Post-op Assessment: Report given to RN  Post vital signs: Reviewed and stable  Last Vitals:  Vitals Value Taken Time  BP 100/64   Temp    Pulse 72   Resp 18   SpO2 100     Last Pain:  Vitals:   04/26/19 1140  TempSrc: Temporal  PainSc:          Complications: No apparent anesthesia complications

## 2019-04-26 NOTE — Anesthesia Procedure Notes (Signed)
Performed by: Demetrius Charity, CRNA Pre-anesthesia Checklist: Patient identified, Emergency Drugs available, Suction available, Patient being monitored and Timeout performed Patient Re-evaluated:Patient Re-evaluated prior to induction Oxygen Delivery Method: Simple face mask

## 2019-04-26 NOTE — Progress Notes (Addendum)
PROGRESS NOTE  Miranda Ryan [id number] DOB: 1945-06-30 DOA: 04/23/2019 PCP: [facility]  Brief History   Miranda Ryan is a 74 y.o. female with medical history significant of hyperlipidemia, diabetes mellitus, COPD, stroke, GERD, depression, anxiety, severe mitral valve stenosis, dementia, bipolar, tobacco abuse, CKD-3, recent admission due to septic shock secondary to Enterococcus faecalis bacteremia, who presents with diarrhea and fever.  Completed course of Unasyn on 04/19/2019.  Also found to be hyperkalemic, AKI and worsening of of her sacral wounds.  The patient has been admitted to a telemetry bed. Consults have been obtained from general surgery, infectious disease, and wound care. Pt will go for debridement of sacral wound tomorrow. She is receiving IV Ceftriaxone after being transitioned from Unasyn. The patient went for operative debridement today. She has tolerated the procedure well. She was fitted with a wound vac.   Consultants  . Wound care . Infectious disease . General surgery  Procedures  . Transfusion 1 unit PRBC's.  Antibiotics   Anti-infectives (From admission, onward)   Start     Dose/Rate Route Frequency Ordered Stop   04/26/19 1200  Ampicillin-Sulbactam (UNASYN) 3 g in sodium chloride 0.9 % 100 mL IVPB     3 g 200 mL/hr over 30 Minutes Intravenous Every 6 hours 04/26/19 1026     04/23/19 1900  cefTRIAXone (ROCEPHIN) 1 g in sodium chloride 0.9 % 100 mL IVPB  Status:  Discontinued     1 g 200 mL/hr over 30 Minutes Intravenous Every 24 hours 04/23/19 1830 04/26/19 1026     Subjective  The patient is resting comfortably. No new complaints.  Objective   Vitals:  Vitals:   04/26/19 1352 04/26/19 1402  BP: 98/66 104/69  Pulse:    Resp: 13 13  Temp:    SpO2: 99%    Exam:  Constitutional:  . The patient is awake, alert, and oriented x 3. No acute distress. Respiratory:  . No increased work of breathing. . No wheezes, rales, or  rhonchi . No tactile fremitus Cardiovascular:  . Regular rate and rhythm . No murmurs, ectopy, or gallups. . No lateral PMI. No thrills. Abdomen:  . Abdomen is soft, non-tender, non-distended . No hernias, masses, or organomegaly . Normoactive bowel sounds.  Musculoskeletal:  . No cyanosis, clubbing, or edema Skin:  . No rashes, lesions, ulcers . palpation of skin: no induration or nodules Neurologic:  . CN 2-12 intact . Sensation all 4 extremities intact  I have personally reviewed the following:   Today's Data  . Vitals, CBC, BMP  Micro Data  . Blood cultures x 2 - No growth x 3 days.  Imaging  . MRI Pelvis without contrast.  Cardiology Data  . EKG (04/25/19): Sinus tachycardia  Scheduled Meds: . acidophilus  1 capsule Oral TID  . ascorbic acid  500 mg Oral BID  . atorvastatin  40 mg Oral q1800  . busPIRone  10 mg Oral BID  . collagenase   Topical Daily  . insulin aspart  0-5 Units Subcutaneous QHS  . insulin aspart  0-9 Units Subcutaneous TID WC  . metoprolol tartrate  25 mg Oral BID  . multivitamin with minerals  1 tablet Oral Daily  . nicotine  21 mg Transdermal Daily  . pantoprazole  40 mg Oral Daily   Continuous Infusions: . sodium chloride Stopped (04/26/19 1147)  . sodium chloride 300 mL/hr at 04/26/19 1231  . ampicillin-sulbactam (UNASYN) IV      Principal Problem:  Hyperkalemia Active Problems:   Bacteremia   Stroke (HCC)   HLD (hyperlipidemia)   COPD (chronic obstructive pulmonary disease) (HCC)   GERD (gastroesophageal reflux disease)   Depression   CKD (chronic kidney disease), stage IIIa   Diarrhea   Sacral decubitus ulcer, stage III (HCC)   Normocytic anemia   Sepsis (HCC)   LOS: 3 days   A & P  Principal Problem:   Hyperkalemia Active Problems:   Bacteremia   Stroke (HCC)   HLD (hyperlipidemia)   COPD (chronic obstructive pulmonary disease) (HCC)   GERD (gastroesophageal reflux disease)   Depression   CKD (chronic kidney  disease), stage IIIa   Diarrhea   Sacral decubitus ulcer, stage III (HCC)   Normocytic anemia   Sepsis (HCC)   Hyperkalemia:  Potassium improved to 3.8 this morning from 6.1 upon admission. Monitor.   Recent Aerococcus faecalis bacteremia and suspected sepsis: Pt continues to have sinus tachycardia, but no hypotension or tachypnea. Sinus tachycardia appears to have resolved with metoprolol. Leukocytosis is resolved. Continue lactic acidosis is likely due to anemia as opposed to sepsis. One unit of PRBC's has been ordered for her. Surveillance blood cultures remain negative. She is receiving IV ceftriaxone.  Sacral decubitus ulcer, stage IV ([place]): Worsening of her sacral decubitus ulcer which is now reaching to bone now.  Concern for osteomyelitis, however MRI does not demonstrate this. 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 Pt went for operative debridement today. She has tolerated the procedure well.   CKD (chronic kidney disease), stage IIIa:  Creatinine improved to 1.06 today. Avoid nephrotoxins and hypotension. Monitor electrolytes, creatinine and volume status.   History of stroke with poor functional status:  Patient is basically bed bound and completely dependent for her ADLs.  Palliative care consult was placed to discuss goals of care. However, her daughter does not want to involve palliative, she was very upset with them when we involved them during previous hospitalization. Continue aspirin and Lipitor.   COPD (chronic obstructive pulmonary disease) ([place]): Stable. Continue as needed albuterol.   GERD (gastroesophageal reflux disease): Protonix   Depression: Continue home medications   Normocytic anemia: Hemoglobin dropped to 6.0 this morning. The patient is being transfused with one unit of PRBC's. Continue to monitor.  Moderate malnutrition: Nutrition consulted.  I have seen and examined this patient myself. I have spent 31 minutes in her evaluation and care.  DVT prophylaxis:  Lovenox Code Status: DNR Family Communication: I have attempted to reach patient's daughter as requested. No answer. Will try again. Disposition Plan: Pending clinical improvement and surgical agreement with discharge.   Rhyder Bratz, DO [facility] Direct contact: see [email or web address]  7PM-7AM contact night coverage as above 04/26/2019, 3:00 PM  LOS: 2 days   ADDENDUM: I did reach Ms [name] yesterday afternoon late. She was informed that the patient was medically appropriate for discharge. She was also informed of issue with wound vac and low air loss mattress. Dr. Lysle Pearl had stated that the patient could be discharged home without vac and on wet to dry dressings. Ms Maurene Capes was uncomfortable with that. However, it seems that the patient  Can be discharged to home with wound vac as long as ordered for mattress is in. She will be discharged to home with wound vac on 04/27/2019. Ms Maurene Capes seemed quite distraught. A chaplain consult was placed to assist her with her stress and frustration. Ms Maurene Capes has also agreed to speak with palliative care. She acknowledges that the patient may no  longer wish to "fight" considering her poor quality of life and prognosis.

## 2019-04-26 NOTE — Op Note (Signed)
Preoperative diagnosis: Stage IV sacral decub ulcer Postoperative diagnosis: same  Procedure: Stage IV sacral decubitus ulcer debridement Anesthesia: MAC  Surgeon: Lysle Pearl  Wound Classification: Contaminated  Indications: Patient is a 74 y.o. female  presented with above.  See H&P for further details.  Specimen: None  Complications: None  Estimated Blood Loss: 15 mL  Findings:  1.  Necrotic tissue down to bone 2. Adequate hemostasis.   Description of procedure: The patient was placed in the left lateral position and MAC anesthesia was induced. The area was prepped and draped in the usual sterile fashion. A timeout was completed verifying correct patient, procedure, site, positioning, and implant(s) and/or special equipment prior to beginning this procedure.  Sharp debridement using a combination of scalpel and electrocautery was used to remove necrotic tissue within the sacral wound. Removal of necrotic tissue reached the sacral periosteum.  No extensive tunneling was noted and healthy bleeding tissue was noted at all surrounding wound edges by completion of the debridement.  Wound was then irrigated, hemostasis achieved with a combination of pressure and electrocautery.  Wound at end of procedure measuring 7 cm x 4-1/2 cm x 3 cm deep at its deepest location.  Wound VAC was then applied to the area with a good seal.  Patient was then awakened from anesthesia and transferred to PACU in stable condition.  At the end of the procedure instrument and sponge counts were correct.

## 2019-04-26 NOTE — Interval H&P Note (Signed)
History and Physical Interval Note:  04/26/2019 11:41 AM  [name]  has presented today for surgery, with the diagnosis of Sacral Ulcer.  The various methods of treatment have been discussed with the patient and family. After consideration of risks, benefits and other options for treatment, the patient has consented to  Procedure(s): IRRIGATION AND DEBRIDEMENT WOUND (N/A) APPLICATION OF WOUND VAC (N/A) as a surgical intervention.  The patient's history has been reviewed, patient examined, no change in status, stable for surgery.  I have reviewed the patient's chart and labs.  Questions were answered to the patient's satisfaction.    Risks include persistent infection, slow healing.  Alternatives include continuing local wound care.  Benefits include infection resolution.  Spoke with daughter POA and she verbalized understanding  Miranda Ryan Lysle Pearl

## 2019-04-26 NOTE — Care Management Important Message (Signed)
Important Message  Patient Details  Name: CAOILAINN QUICKLE MRN: [id number] Date of Birth: 11-08-1945   Medicare Important Message Given:  Yes     Dannette Barbara 04/26/2019, 1:36 PM

## 2019-04-27 LAB — BASIC METABOLIC PANEL
Anion gap: 9 (ref 5–15)
BUN: 22 mg/dL (ref 8–23)
CO2: 23 mmol/L (ref 22–32)
Calcium: 7.5 mg/dL — ABNORMAL LOW (ref 8.9–10.3)
Chloride: 104 mmol/L (ref 98–111)
Creatinine, Ser: 0.98 mg/dL (ref 0.44–1.00)
GFR calc Af Amer: >60 mL/min (ref >60)
GFR calc non Af Amer: 57 mL/min — ABNORMAL LOW (ref >60)
Glucose, Bld: 215 mg/dL — ABNORMAL HIGH (ref 70–99)
Potassium: 3.7 mmol/L (ref 3.5–5.1)
Sodium: 136 mmol/L (ref 135–145)

## 2019-04-27 LAB — CBC
HCT: 31.3 % — ABNORMAL LOW (ref 36.0–46.0)
Hemoglobin: 9.5 g/dL — ABNORMAL LOW (ref 12.0–15.0)
MCH: 28.4 pg (ref 26.0–34.0)
MCHC: 30.4 g/dL (ref 30.0–36.0)
MCV: 93.4 fL (ref 80.0–100.0)
Platelets: 255 10*3/uL (ref 150–400)
RBC: 3.35 MIL/uL — ABNORMAL LOW (ref 3.87–5.11)
RDW: 19.9 % — ABNORMAL HIGH (ref 11.5–15.5)
WBC: 10.6 10*3/uL — ABNORMAL HIGH (ref 4.0–10.5)
nRBC: 0 % (ref 0.0–0.2)

## 2019-04-27 LAB — GLUCOSE, CAPILLARY
Glucose-Capillary: 154 mg/dL — ABNORMAL HIGH (ref 70–99)
Glucose-Capillary: 165 mg/dL — ABNORMAL HIGH (ref 70–99)

## 2019-04-27 MED ORDER — POTASSIUM CHLORIDE CRYS ER 20 MEQ PO TBCR: 20.0000 meq | EXTENDED_RELEASE_TABLET | Freq: Every day | ORAL | 0 refills | Status: AC

## 2019-04-27 MED ORDER — ENSURE ENLIVE PO LIQD: 237.0000 mL | Freq: Two times a day (BID) | ORAL | Status: DC

## 2019-04-27 MED ORDER — NICOTINE 21 MG/24HR TD PT24: 21.0000 mg | MEDICATED_PATCH | Freq: Every day | TRANSDERMAL | 0 refills | Status: AC

## 2019-04-27 MED ORDER — TRAMADOL HCL 50 MG PO TABS: 50.0000 mg | ORAL_TABLET | Freq: Four times a day (QID) | ORAL | 0 refills | Status: AC | PRN

## 2019-04-27 MED ORDER — AMOXICILLIN-POT CLAVULANATE 875-125 MG PO TABS: 1.0000 | ORAL_TABLET | Freq: Two times a day (BID) | ORAL | 0 refills | Status: AC

## 2019-04-27 MED ORDER — METOPROLOL TARTRATE 25 MG PO TABS: 25.0000 mg | ORAL_TABLET | Freq: Two times a day (BID) | ORAL | 0 refills | Status: AC

## 2019-04-27 NOTE — Progress Notes (Signed)
   04/27/19 1500  Clinical Encounter Type  Visited With Family  Visit Type Follow-up  Referral From Nurse  Consult/Referral To Chaplain  Per OR Chaplain called daughter, [name].  [name] is feeling overwhelmed and feels as thought her mother is often released to soon from the hospital. [name] confirmed that patient has had multiple strokes and is immobile. Patient is scheduled for discharge later today.

## 2019-04-27 NOTE — Progress Notes (Signed)
New referral for [facility] community Palliative program to follow at home post discharge. Patient information given to referral. Patient will also be followed by [facility] Thank you for this referral.  Flo Shanks BSN, RN, [place] [phone]

## 2019-04-27 NOTE — Progress Notes (Addendum)
   04/27/19 1100  Clinical Encounter Type  Visited With Patient  Visit Type Follow-up;Spiritual support;Social support  Referral From Nurse  Consult/Referral To Lauretta Chester briefly visited with patient. Patient told Chaplain that she is going home today. Chaplain asked if she was excited and she said yes. Chaplain offered prayer and left.

## 2019-04-27 NOTE — TOC Transition Note (Signed)
Transition of Care [facility]) - CM/SW Discharge Note   Patient Details  Name: Miranda Ryan MRN: [id number] Date of Birth: Aug 17, 1945  [facility] of [facility]) CM/SW Contact:  Magnus Ivan, LCSW Phone Number: 04/27/2019, 3:12 PM   Clinical Narrative:   Patient to discharge home today. CSW set up [facility] EMS transport. Notified patient's daughter, [name], [facility] Rep. Brad, and [facility] Rep. Corene Cornea. Patient has wound vac and wound supplies in room to go home with her. Carmen requested night gown and bed sheet be sent home with patient, RN informed. No other needs identified at this time. CSW signing off.    Final next level of care: Home w Home Health Services Barriers to Discharge: Barriers Resolved   Patient Goals and CMS Choice     Choice offered to / list presented to : (Offered list to patient's daughter, she declined and said she has no preference.)  Discharge Placement                Patient to be transferred to facility by: Non emergent EMS Name of family member notified: [name] Patient and family notified of of transfer: 04/27/19  Discharge Plan and Services     Post Acute Care Choice: Home Health          DME Arranged: Vac, Specialty mattress DME Agency: [facility] Date DME Agency Contacted: 04/27/19 Time DME Agency Contacted: [phone] Representative spoke with at DME Agency: [place]: RN, OT, PT [facility] Agency: [place] ([place]) Date HH Agency Contacted: 04/27/19 Time [place]: 1447 Representative spoke with at [place]: [place] (SDOH) Interventions     Readmission Risk Interventions Readmission Risk Prevention Plan 04/26/2019  Transportation Screening Complete  PCP or Specialist Appt within 3-5 Days Complete  HRI or Home Care Consult Complete  Medication Review (RN Care Manager) Complete  Some recent data might be hidden

## 2019-04-27 NOTE — Discharge Summary (Signed)
Physician Discharge Summary  Miranda Ryan [id number] DOB: 22-Nov-1945 DOA: 04/23/2019  PCP: [facility] date: 04/23/2019 Discharge date: 04/27/2019  Recommendations for Outpatient Follow-up:  1. Follow up with PCP in 7-10 days 2. Wound care for Wound vac at home 3. Low air loss mattress for home to arrive soon 4. PT/OT/Wound care at home MWF 5. Palliative care consult as outpatient.  Follow-up Information    [facility] On 04/28/2019.   Why: Go to walk in clinic instead and state its a followup after discharge (septick shock). [place] [address] Contact information: [place] [address] [phone]          Discharge Diagnoses: Principal diagnosis is #1 1. Worsening infection of sacral wounds 2. Severe anemia 3. COPD 4. CKD IIIb 5. Debility due to history of stroke 6. Anxiety and depression 7. GERD  Discharge Condition: Fair  Disposition: Home with home health PT/OT  Diet recommendation: Heart healthy  Filed Weights   04/23/19 1156 04/23/19 1906  Weight: 52.6 kg 52.6 kg   History of present illness:  Miranda Ryan is a 74 y.o. female with medical history significant of hyperlipidemia, diabetes mellitus, COPD, stroke, GERD, depression, anxiety, severe mitral valve stenosis, dementia, bipolar, tobacco abuse, CKD-3, recent admission due to septic shock secondary to bacteremia, who presents with diarrhea and fever.  Per her daughter who is at the bedside, patient was recently hospitalized from 2/9-2/18 due to septic shock secondary to bacteremia Tree surgeon. Facael) from decubitus ulcer.pt continued on IVUnasyn till 2/25. She was discharged to [facility]. Pt was sent home on Sat after complete course of antibiotics.  Her daughter states that the patient developed diarrhea over the weekend.  Patient has had multiple loose stool bowel movement.  Daughter treated patient with Imodium with little  improvement.  No active nausea vomiting or abdominal pain.  Patient does not have chest pain, cough.  She has mild shortness of breath.  Patient has fever for 101 this morning.  In ED, her temperature is 97.7.  No symptoms of UTI or unilateral weakness.  In the ED the pt was found to have WBC 10.7, lactic acid 2.7, negative Covid Ag test, pending Covid PCR, renal function close to baseline, potassium 6.1, temperature 97.7, blood pressure 170/79, heart rate 100, RR 24, oxygen saturation 100% on room air.  Chest x-ray showed stable bilateral basilar airspace disease.  Patient is admitted to [facility] bed as inpatient.  Hospital Course:  The patient was admitted to a med/surg bed. The patient experienced an acute worsening of her chronic anemia due to chronic inflammation with a hemoglobin of 6. The patient was transfused with one unit PRBC's.  General surgery was consulted as was wound care. MRI of the pelvis was performed and did not demonstrate osteomyelitis of the sacrum. Dr Terrence Dupont took the patient to surgery to debride necrotic tissue from the wound. He observed that the periosteum was not affected by infection. The patient has had a wound vac placed. She will be discharged with wound care to care for the vac. Specialty mattress has been ordered, and will follow the patient home after discharge when arrangements with insurance have been completed.  Blood cultures were drawn on 04/23/2019. They have had no growth.  The patient will be discharged to home today in fair condition.  Today's assessment: S: The patient is resting comfortably. No new complaints. O: Vitals:  Vitals:   04/27/19 1136 04/27/19 1139  BP: (!) 112/99   Pulse: 64 81  Resp:    Temp:    SpO2: (!) 82% 97%   Constitutional:   The patient is awake, alert, and oriented x 3. No acute distress. Respiratory:   No increased work of breathing.  No wheezes, rales, or rhonchi  No tactile fremitus Cardiovascular:   Regular rate and  rhythm  No murmurs, ectopy, or gallups.  No lateral PMI. No thrills. Abdomen:   Abdomen is soft, non-tender, non-distended  No hernias, masses, or organomegaly  Normoactive bowel sounds.  Musculoskeletal:   No cyanosis, clubbing, or edema Skin:   No rashes, lesions, ulcers  palpation of skin: no induration or nodules Neurologic:   CN 2-12 intact  Sensation all 4 extremities intact Discharge Instructions  Discharge Instructions    Activity as tolerated - No restrictions   Complete by: As directed    Amb Referral to Palliative Care   Complete by: As directed    Call MD for:  persistant nausea and vomiting   Complete by: As directed    Call MD for:  redness, tenderness, or signs of infection (pain, swelling, redness, odor or green/yellow discharge around incision site)   Complete by: As directed    Call MD for:  severe uncontrolled pain   Complete by: As directed    Call MD for:  temperature >100.4   Complete by: As directed    Diet - low sodium heart healthy   Complete by: As directed    Discharge instructions   Complete by: As directed    Follow up with PCP in 7-10 days Wound care for Wound vac at home Low air loss mattress for home to arrive soon PT/OT/Wound care at home MWF Palliative care consult as outpatient. Chemistry to be drawn at PCP visit.   Increase activity slowly   Complete by: As directed      Allergies as of 04/27/2019      Reactions   Demerol [meperidine] Other (See Comments)   Pt states she "turned green"       Medication List    STOP taking these medications   ibuprofen 200 MG tablet Commonly known as: ADVIL     TAKE these medications   acetaminophen 325 MG tablet Commonly known as: TYLENOL Take 2 tablets (650 mg total) by mouth every 6 (six) hours as needed for mild pain (or Fever >/= 101).   acidophilus Caps capsule Take 1 capsule by mouth 3 (three) times daily.   amoxicillin-clavulanate 875-125 MG tablet Commonly known as:  Augmentin Take 1 tablet by mouth 2 (two) times daily for 10 days.   ascorbic acid 500 MG tablet Commonly known as: VITAMIN C Take 1 tablet (500 mg total) by mouth 2 (two) times daily.   aspirin 81 MG EC tablet Take 1 tablet (81 mg total) by mouth daily.   atorvastatin 40 MG tablet Commonly known as: LIPITOR Take 1 tablet (40 mg total) by mouth daily at 6 PM.   busPIRone 10 MG tablet Commonly known as: BUSPAR Take 10 mg by mouth 2 (two) times daily.   collagenase ointment Commonly known as: SANTYL Apply topically 2 (two) times daily.   feeding supplement (PRO-STAT SUGAR FREE 64) Liqd Take 30 mLs by mouth daily.   metFORMIN 500 MG tablet Commonly known as: Glucophage Take 1 tablet (500 mg total) by mouth 2 (two) times daily with a meal.   metoprolol tartrate 25 MG tablet Commonly known as: LOPRESSOR Take 1 tablet (25  mg total) by mouth 2 (two) times daily.   multivitamin with minerals Tabs tablet Take 1 tablet by mouth daily.   nicotine 21 mg/24hr patch Commonly known as: NICODERM CQ - dosed in mg/24 hours Place 1 patch (21 mg total) onto the skin daily.   pantoprazole 40 MG tablet Commonly known as: PROTONIX Take 1 tablet (40 mg total) by mouth daily.   polyvinyl alcohol 1.4 % ophthalmic solution Commonly known as: LIQUIFILM TEARS Place 1 drop into both eyes 4 (four) times daily as needed for dry eyes.   potassium chloride SA 20 MEQ tablet Commonly known as: KLOR-CON Take 1 tablet (20 mEq total) by mouth daily for 1 day. What changed: how much to take   Tradjenta 5 MG Tabs tablet Generic drug: linagliptin Take 5 mg by mouth daily.   traMADol 50 MG tablet Commonly known as: ULTRAM Take 1 tablet (50 mg total) by mouth every 6 (six) hours as needed for moderate pain.   zinc sulfate 220 (50 Zn) MG capsule Take 220 mg by mouth daily.            Durable Medical Equipment  (From admission, onward)         Start     Ordered   04/26/19 1422  For home  use only DME Other see comment  Once    Comments: Air loss mattress.  Question:  Length of Need  Answer:  Lifetime   04/26/19 1421         Allergies  Allergen Reactions  . Demerol [Meperidine] Other (See Comments)    Pt states she "turned green"     The results of significant diagnostics from this hospitalization (including imaging, microbiology, ancillary and laboratory) are listed below for reference.    Significant Diagnostic Studies: DG Pelvis 1-2 Views  Result Date: 04/24/2019 CLINICAL DATA:  Pressure ulcer posterior pelvis EXAM: PELVIS - 1-2 VIEW COMPARISON:  None. FINDINGS: No destructive osseous changes within limitation of overlying bowel gas and residual barium. Sacroiliac joints are obscured. IMPRESSION: No gross abnormality.  Suboptimal evaluation for osteomyelitis. Electronically Signed   By: Macy Mis M.D.   On: 04/24/2019 09:33   MR PELVIS WO CONTRAST  Result Date: 04/24/2019 CLINICAL DATA:  Question of osteomyelitis, sacral decubitus ulcer EXAM: MRI PELVIS WITHOUT CONTRAST TECHNIQUE: Multiplanar multisequence MR imaging of the pelvis was performed. No intravenous contrast was administered. COMPARISON:  None. FINDINGS: Urinary Tract: The visualized distal ureters and bladder appear unremarkable. Bowel: No bowel wall thickening, distention or surrounding inflammation identified within the pelvis. Vascular/Lymphatic: No enlarged pelvic lymph nodes identified. No significant vascular findings. Reproductive: Small nabothian cysts are present. There is a T1/T2 dark intramural uterine fibroid seen in the posterior fundus. Other: A small amount of free fluid is seen within the pelvis. Musculoskeletal: Overlying the posterior right inferior coccyx there is a decubitus ulcer measuring 2.4 cm in transverse dimension. No sinus tract or loculated fluid collection is noted. No area of cortical destruction or periosteal reaction is seen. There is moderate right and moderate to advanced  left hip osteoarthritis with superior joint space loss. There is reactive marrow seen at the femoroacetabular joint at the left hip. There is diffusely increased signal seen throughout the muscles surrounding the pelvis with mild fatty atrophy. There is also subcutaneous edema seen around the posterior pelvis. The visualized portions of the tendons appear to be grossly intact. IMPRESSION: 1. Posterior right inferior decubitus ulcer overlying the coccyx. No loculated fluid collection or evidence of  osteomyelitis. 2. Diffuse increased signal seen throughout the muscles, which could be due to myositis or acute denervation atrophy. 3. Moderate to advanced left hip osteoarthritis with reactive marrow. 4. Small deep pelvic ascites. 5. Intramural uterine fibroids. Electronically Signed   By: Prudencio Pair M.D.   On: 04/24/2019 23:24   DG Chest Portable 1 View  Result Date: 04/23/2019 CLINICAL DATA:  Pt to ED by [facility] due to family concerns over wound. Pt recently hospitalized for sepsis and discharged to [facility]. Pt sent home on Sat after antibiotic therapy at [facility]. Pt's daughter also reports pt had diarrhea over the .*comment was truncated*fever, ams, eval for infiltrate EXAM: PORTABLE CHEST 1 VIEW COMPARISON:  04/03/2019 FINDINGS: Normal cardiac silhouette. Mild increased airspace disease lung bases. No focal infiltrate. No pleural fluid. No pneumothorax. IMPRESSION: Subtle bibasilar airspace disease could represent pulmonary edema or viral pneumonia. Electronically Signed   By: Suzy Bouchard M.D.   On: 04/23/2019 12:53   DG Chest Port 1 View  Result Date: 04/03/2019 CLINICAL DATA:  Wound, weakness, COPD, dementia, diabetes mellitus EXAM: PORTABLE CHEST 1 VIEW COMPARISON:  Portable exam 1315 hours compared to 02/21/2019 FINDINGS: Normal heart size, mediastinal contours, and pulmonary vascularity. Atherosclerotic calcification aorta. Skin folds project over RIGHT chest. Lungs clear. No pulmonary infiltrate,  pleural effusion or pneumothorax. Bones demineralized. IMPRESSION: No acute abnormalities. Electronically Signed   By: Lavonia Dana M.D.   On: 04/03/2019 13:27   ECHOCARDIOGRAM COMPLETE  Result Date: 04/05/2019    ECHOCARDIOGRAM REPORT   Patient Name:   Miranda Ryan Date of Exam: 04/05/2019 Medical Rec #:  [id number]      Height:       64.0 in Accession #:    [id number]     Weight:       114.0 lb Date of Birth:  1945/06/28       BSA:          1.54 m Patient Age:    44 years       BP:           79/54 mmHg Patient Gender: F              HR:           96 bpm. Exam Location:  [facility] Procedure: 2D Echo, Color Doppler and Cardiac Doppler Indications:     R78.81 Bacteremia  History:         Patient has prior history of Echocardiogram examinations, most                  recent 02/22/2019. COPD and Stroke; Risk Factors:Diabetes.  Sonographer:     Charmayne Sheer RDCS (AE) Referring Phys:  [id number] Tsosie Billing Diagnosing Phys: Ida Rogue MD  Sonographer Comments: Image acquisition challenging due to patient body habitus. IMPRESSIONS  1. Left ventricular ejection fraction, by estimation, is 55 to 60%. The left ventricle has normal function. The left ventrical has no regional wall motion abnormalities. There is mildly increased left ventricular hypertrophy. Left ventricular diastolic parameters are consistent with Grade I diastolic dysfunction (impaired relaxation).  2. Right ventricular systolic function is normal. The right ventricular size is normal.  3. The mitral valve was not well visualized. no evidence of mitral valve regurgitation. No evidence of mitral stenosis.  4. The aortic valve was not well visualized. Aortic valve regurgitation is not visualized. No aortic stenosis is present.  5. The inferior vena cava is normal in size with greater than 50% respiratory variability,  suggesting right atrial pressure of 3 mmHg.  6. Valves not well visualized. Challenging image study. FINDINGS  Left Ventricle: Left  ventricular ejection fraction, by estimation, is 55 to 60%. The left ventricle has normal function. The left ventricle has no regional wall motion abnormalities. The left ventricular internal cavity size was normal in size. There is  mildly increased left ventricular hypertrophy. Left ventricular diastolic parameters are consistent with Grade I diastolic dysfunction (impaired relaxation). Right Ventricle: The right ventricular size is normal. No increase in right ventricular wall thickness. Right ventricular systolic function is normal. Left Atrium: Left atrial size was normal in size. Right Atrium: Right atrial size was normal in size. Pericardium: There is no evidence of pericardial effusion. Mitral Valve: The mitral valve was not well visualized. Normal mobility of the mitral valve leaflets. Moderate mitral annular calcification. No evidence of mitral valve regurgitation. No evidence of mitral valve stenosis. MV peak gradient, 33.2 mmHg. The  mean mitral valve gradient is 10.0 mmHg. Tricuspid Valve: The tricuspid valve is normal in structure. Tricuspid valve regurgitation is not demonstrated. No evidence of tricuspid stenosis. Aortic Valve: The aortic valve was not well visualized. Aortic valve regurgitation is not visualized. No aortic stenosis is present. Aortic valve mean gradient measures 8.0 mmHg. Aortic valve peak gradient measures 13.0 mmHg. Aortic valve area, by VTI measures 2.47 cm. Pulmonic Valve: The pulmonic valve was normal in structure. Pulmonic valve regurgitation is not visualized. No evidence of pulmonic stenosis. Aorta: The aortic root is normal in size and structure. Venous: The inferior vena cava is normal in size with greater than 50% respiratory variability, suggesting right atrial pressure of 3 mmHg. The inferior vena cava and the hepatic vein show a normal flow pattern. IAS/Shunts: No atrial level shunt detected by color flow Doppler.  LEFT VENTRICLE PLAX 2D LVIDd:         3.47 cm   Diastology LVIDs:         2.24 cm  LV e' lateral:   8.70 cm/s LV PW:         0.99 cm  LV E/e' lateral: 14.5 LV IVS:        1.01 cm  LV e' medial:    5.11 cm/s LVOT diam:     1.80 cm  LV E/e' medial:  24.7 LV SV:         72.78 ml LV SV Index:   21.57 LVOT Area:     2.54 cm  LEFT ATRIUM           Index       RIGHT ATRIUM           Index LA diam:      2.70 cm 1.75 cm/m  RA Area:     10.50 cm LA Vol (A4C): 29.9 ml 19.38 ml/m RA Volume:   21.30 ml  13.83 ml/m  AORTIC VALVE                    PULMONIC VALVE AV Area (Vmax):    2.32 cm     PV Vmax:       1.55 m/s AV Area (Vmean):   2.21 cm     PV Vmean:      109.000 cm/s AV Area (VTI):     2.47 cm     PV VTI:        0.288 m AV Vmax:           180.00 cm/s  PV [place] grad:  9.6 mmHg AV Vmean:          129.000 cm/s PV Mean grad:  6.0 mmHg AV VTI:            0.295 m AV [place] Grad:      13.0 mmHg AV Mean Grad:      8.0 mmHg LVOT Vmax:         164.00 cm/s LVOT Vmean:        112.000 cm/s LVOT VTI:          0.286 m LVOT/AV VTI ratio: 0.97  AORTA Ao Root diam: 2.80 cm MITRAL VALVE MV Area (PHT): 2.99 cm              SHUNTS MV [place] grad:  33.2 mmHg             Systemic VTI:  0.29 m MV Mean grad:  10.0 mmHg             Systemic Diam: 1.80 cm MV Vmax:       2.88 m/s MV Vmean:      139.0 cm/s MV Decel Time: 254 msec MV E velocity: 126.00 cm/s 103 cm/s MV A velocity: 200.00 cm/s 70.3 cm/s MV E/A ratio:  0.63        1.5 Ida Rogue MD Electronically signed by Ida Rogue MD Signature Date/Time: 04/05/2019/6:34:24 PM    Final    [place] EKG SITE RITE  Result Date: 04/10/2019 If Site Rite image not attached, placement could not be confirmed due to current cardiac rhythm.   Microbiology: Recent Results (from the past 240 hour(s))  Blood culture (routine x 2)     Status: None (Preliminary result)   Collection Time: 04/23/19  2:20 PM   Specimen: BLOOD  Result Value Ref Range Status   Specimen Description BLOOD BLOOD RIGHT HAND  Final   Special Requests   Final    BOTTLES  DRAWN AEROBIC ONLY Blood Culture adequate volume   Culture   Final    NO GROWTH 4 DAYS Performed at [facility], [address], [place], [place] [address]    Report Status PENDING  Incomplete  SARS CORONAVIRUS 2 (TAT 6-24 HRS) Nasopharyngeal Nasopharyngeal Swab     Status: None   Collection Time: 04/23/19  3:14 PM   Specimen: Nasopharyngeal Swab  Result Value Ref Range Status   SARS Coronavirus 2 NEGATIVE NEGATIVE Final    Comment: (NOTE) SARS-CoV-2 target nucleic acids are NOT DETECTED. The SARS-CoV-2 RNA is generally detectable in upper and lower respiratory specimens during the acute phase of infection. Negative results do not preclude SARS-CoV-2 infection, do not rule out co-infections with other pathogens, and should not be used as the sole basis for treatment or other patient management decisions. Negative results must be combined with clinical observations, patient history, and epidemiological information. The expected result is Negative. Fact Sheet for Patients: SugarRoll.be Fact Sheet for Healthcare Providers: https://www.woods-mathews.com/ This test is not yet approved or cleared by the [place] FDA and  has been authorized for detection and/or diagnosis of SARS-CoV-2 by FDA under an Emergency Use Authorization (EUA). This EUA will remain  in effect (meaning this test can be used) for the duration of the COVID-19 declaration under Section 56 4(b)(1) of the Act, 21 U.S.C. section 360bbb-3(b)(1), unless the authorization is terminated or revoked sooner. Performed at [facility], [place] [address] [place], [place] [address]   Blood culture (routine x 2)     Status: None (Preliminary result)  Collection Time: 04/23/19  5:44 PM   Specimen: BLOOD  Result Value Ref Range Status   Specimen Description BLOOD BLOOD LEFT HAND  Final   Special Requests   Final    BOTTLES DRAWN AEROBIC ONLY Blood Culture adequate  volume   Culture   Final    NO GROWTH 4 DAYS Performed at [facility], [address] [place], [place] [address]    Report Status PENDING  Incomplete     Labs: Basic Metabolic Panel: Recent Labs  Lab 04/23/19 1247 04/23/19 1247 04/24/19 0617 04/24/19 1640 04/25/19 0353 04/26/19 0606 04/27/19 0944  NA 141  --  140  --  137 135 136  K 6.1*   < > 5.2* 4.3 3.6 3.8 3.7  CL 106  --  108  --  108 104 104  CO2 25  --  22  --  20* 23 23  GLUCOSE 100*  --  81  --  114* 139* 215*  BUN 23  --  22  --  20 24* 22  CREATININE 1.39*  --  1.19*  --  1.18* 1.06* 0.98  CALCIUM 8.0*  --  7.6*  --  7.2* 7.3* 7.5*   < > = values in this interval not displayed.   Liver Function Tests: Recent Labs  Lab 04/23/19 1247  AST 23  ALT 16  ALKPHOS 117  BILITOT 0.6  PROT 6.0*  ALBUMIN 1.9*   No results for input(s): LIPASE, AMYLASE in the last 168 hours. No results for input(s): AMMONIA in the last 168 hours. CBC: Recent Labs  Lab 04/23/19 1247 04/23/19 1247 04/24/19 0617 04/25/19 0353 04/25/19 1751 04/26/19 0606 04/27/19 0944  WBC 10.7*  --  8.8 8.1  --  9.6 10.6*  NEUTROABS 8.6*  --   --   --   --  7.2  --   HGB 7.8*   < > 7.8* 6.0* 9.2* 8.0* 9.5*  HCT 26.1*   < > 26.6* 21.2* 30.8* 26.2* 31.3*  MCV 92.2  --  93.0 94.2  --  91.0 93.4  PLT 246  --  190 244  --  251 255   < > = values in this interval not displayed.   Cardiac Enzymes: No results for input(s): CKTOTAL, CKMB, CKMBINDEX, TROPONINI in the last 168 hours. BNP: BNP (last 3 results) No results for input(s): BNP in the last 8760 hours.  ProBNP (last 3 results) No results for input(s): PROBNP in the last 8760 hours.  CBG: Recent Labs  Lab 04/26/19 1312 04/26/19 1635 04/26/19 2256 04/27/19 0731 04/27/19 1133  GLUCAP 113* 99 199* 154* 165*    Principal Problem:   Hyperkalemia Active Problems:   Bacteremia   Stroke (HCC)   HLD (hyperlipidemia)   COPD (chronic obstructive pulmonary disease) (HCC)    GERD (gastroesophageal reflux disease)   Depression   CKD (chronic kidney disease), stage IIIa   Diarrhea   Sacral decubitus ulcer, stage III (HCC)   Normocytic anemia   Sepsis ([place])   Time coordinating discharge: 38 minutes.  Signed:        Setareh Rom, DO [facility]  04/27/2019, 2:46 PM

## 2019-04-27 NOTE — TOC Progression Note (Addendum)
Transition of Care [facility]) - Progression Note    Patient Details  Name: Miranda Ryan MRN: [id number] Date of Birth: April 17, 1945  Transition of Care [facility]) CM/SW Okay, LCSW Phone Number: 04/27/2019, 9:50 AM  Clinical Narrative:     8:10- Call from daughter, Miranda Ryan. Miranda Ryan is upset, saying she was told yesterday afternoon that patient would be switched to wet to dry wound dressings and sent home today. Miranda Ryan stated she does not feel she has what she needs for patient to return home. Miranda Ryan reported she is not physically able to get patient into the house and her husband is not home today. Reassurance was provided. Informed Miranda Ryan that CSW was told plan was for patient to discharge home with wound vac, order for low air flow mattress, and home health. Miranda Ryan that [place] will follow up with Providers to confirm plan.   Spoke with MDs who reported the plan is still for patient to discharge home with wound vac, home health, and low air flow mattress.   9:50- [facility] in [place] to schedule PCP appointment for patient. Next available appointment on a Monday is 3/22 at 11:15 am with Dr. Ola Spurr. Appointment scheduled for patient.  Confirmed with Equipment Rep Miranda Ryan that patient's wound vac will be delivered today and mattress will be ordered, but may take up to 10 days to arrive to the home.   11:00- Confirmed with Miranda Ryan at [facility] that they can accept patient for Home Health (PT, RN, OT).   12:45: CSW called and spoke with patient's daughter, Miranda Ryan with update. Miranda Ryan was agreeable to patient returning home with wound vac and using the hospital bed mattress she already has until the new one is delivered to the home. Informed Miranda Ryan that [place] with [facility] will be reaching out to her about starting home health services. CSW was also informed of order for Outpatient Palliative through [facility]. Miranda Ryan was agreeable to trying this service as well. Informed her  Representative Miranda Ryan may be reaching out to her about this. Informed Miranda Ryan of PCP appointment that was made with [facility]. Miranda Ryan was satisfied with these plans and said she would feel comfortable with patient coming home today as long as EMS can transport patient home and get patient into bed at home. Miranda Ryan said if EMS cannot transport, "I know it won't be covered by insurance, but I would pay for her to stay another night at the hospital, send me a bill" until her husband is home tomorrow to help get patient into bed. CSW updated MD and RN. Will follow up with EMS after wound vac is delivered to the room and placed.       Expected Discharge Plan: [place] Barriers to Discharge: Insurance Authorization, Continued Medical Work up  Expected Discharge Plan and Services Expected Discharge Plan: [place] arrangements for the past 2 months: Single Family Home Expected Discharge Date: 04/27/19               DME Arranged: Harlin Heys, Other see comment(low air loss mattress) DME Agency: [facility] Date DME Agency Contacted: 04/26/19 Time DME Agency Contacted: [id number] Representative spoke with at DME Agency: Miranda Ryan: [place] ([place]) Date [place]: 04/26/19 Time [place]: [place] Representative spoke with at [place]: Miranda Ryan   Social Determinants of Health (SDOH) Interventions    Readmission  Risk Interventions Readmission Risk Prevention Plan 04/26/2019  Transportation Screening Complete  PCP or Specialist Appt within 3-5 Days Complete  HRI or Home Care Consult Complete  Medication Review (RN Care Manager) Complete  Some recent data might be hidden

## 2019-04-27 NOTE — Progress Notes (Signed)
Subjective:  CC: Miranda Ryan is a 74 y.o. female  Hospital stay day 4, 1 Day Post-Op sacral wound debridement  HPI: No issues overnight.  No complaint of pain in sacral area.  Most lucent since initial exam.  ROS:  General: Denies weight loss, weight gain, fatigue, fevers, chills, and night sweats. Heart: Denies chest pain, palpitations, racing heart, irregular heartbeat, leg pain or swelling, and decreased activity tolerance. Respiratory: Denies breathing difficulty, shortness of breath, wheezing, cough, and sputum. GI: Denies change in appetite, heartburn, nausea, vomiting, constipation, diarrhea, and blood in stool. GU: Denies difficulty urinating, pain with urinating, urgency, frequency, blood in urine.   Objective:   Temp:  [97.4 F (36.3 C)-98.7 F (37.1 C)] 97.7 F (36.5 C) (03/05 0542) Pulse Rate:  [46-104] 61 (03/05 0542) Resp:  [13-22] 18 (03/05 0542) BP: (93-108)/(65-90) 106/78 (03/05 0542) SpO2:  [89 %-100 %] 96 % (03/05 0542)     Height: 5\' 3"  (160 cm) Weight: 52.6 kg BMI (Calculated): 20.55   Intake/Output this shift:   Intake/Output Summary (Last 24 hours) at 04/27/2019 0839 Last data filed at 04/27/2019 [id number] Gross per 24 hour  Intake 220 ml  Output 510 ml  Net -290 ml    Constitutional :  alert, cooperative, appears stated age and no distress  Respiratory:  clear to auscultation bilaterally  Cardiovascular:  regular rate and rhythm     Skin: Cool and moist. Wound vac intact, no leak.  Canister with minimal blood.  Psychiatric: Normal affect, non-agitated, not confused       LABS:  CMP Latest Ref Rng & Units 04/26/2019 04/25/2019 04/24/2019  Glucose 70 - 99 mg/dL 139(H) 114(H) -  BUN 8 - 23 mg/dL 24(H) 20 -  Creatinine 0.44 - 1.00 mg/dL 1.06(H) 1.18(H) -  Sodium 135 - 145 mmol/L 135 137 -  Potassium 3.5 - 5.1 mmol/L 3.8 3.6 4.3  Chloride 98 - 111 mmol/L 104 108 -  CO2 22 - 32 mmol/L 23 20(L) -  Calcium 8.9 - 10.3 mg/dL 7.3(L) 7.2(L) -  Total Protein 6.5 -  8.1 g/dL - - -  Total Bilirubin 0.3 - 1.2 mg/dL - - -  Alkaline Phos 38 - 126 U/L - - -  AST 15 - 41 U/L - - -  ALT 0 - 44 U/L - - -   CBC Latest Ref Rng & Units 04/26/2019 04/25/2019 04/25/2019  WBC 4.0 - 10.5 K/uL 9.6 - 8.1  Hemoglobin 12.0 - 15.0 g/dL 8.0(L) 9.2(L) 6.0(L)  Hematocrit 36.0 - 46.0 % 26.2(L) 30.8(L) 21.2(L)  Platelets 150 - 400 K/uL 251 - 244    RADS: CLINICAL DATA:  Question of osteomyelitis, sacral decubitus ulcer  EXAM: MRI PELVIS WITHOUT CONTRAST  TECHNIQUE: Multiplanar multisequence MR imaging of the pelvis was performed. No intravenous contrast was administered.  COMPARISON:  None.  FINDINGS: Urinary Tract: The visualized distal ureters and bladder appear unremarkable.  Bowel: No bowel wall thickening, distention or surrounding inflammation identified within the pelvis.  Vascular/Lymphatic: No enlarged pelvic lymph nodes identified. No significant vascular findings.  Reproductive: Small nabothian cysts are present. There is a T1/T2 dark intramural uterine fibroid seen in the posterior fundus.  Other: A small amount of free fluid is seen within the pelvis.  Musculoskeletal: Overlying the posterior right inferior coccyx there is a decubitus ulcer measuring 2.4 cm in transverse dimension. No sinus tract or loculated fluid collection is noted. No area of cortical destruction or periosteal reaction is seen. There is moderate right and  moderate to advanced left hip osteoarthritis with superior joint space loss. There is reactive marrow seen at the femoroacetabular joint at the left hip. There is diffusely increased signal seen throughout the muscles surrounding the pelvis with mild fatty atrophy. There is also subcutaneous edema seen around the posterior pelvis. The visualized portions of the tendons appear to be grossly intact.  IMPRESSION: 1. Posterior right inferior decubitus ulcer overlying the coccyx. No loculated fluid collection or  evidence of osteomyelitis. 2. Diffuse increased signal seen throughout the muscles, which could be due to myositis or acute denervation atrophy. 3. Moderate to advanced left hip osteoarthritis with reactive marrow. 4. Small deep pelvic ascites. 5. Intramural uterine fibroids.   Electronically Signed   By: Prudencio Pair M.D.   On: 04/24/2019 23:24  Assessment:   Sacral wound debridement-  Doing well with wound vac in place.  Plan is d/c with wound vac to home with home health, while waiting for specialized mattress to be delivered.  Plan discussed with home health agency, SW, and admitting physician and all verbalized understanding.  Ok to d/c from wound care standpoint, if deemed medically stable by admitting provider.  Further medical workup for her anemia, and other chronic issues per primary

## 2019-04-27 NOTE — Clinical Social Work Note (Signed)
[facility] in [place] and [facility] only have an NP that is taking new patients. [place] has one MD that is taking new patients but she is booked out until beginning of April.  [place] Scrape, [place]

## 2019-04-27 NOTE — Progress Notes (Signed)
   04/27/19 1100  Clinical Encounter Type  Visited With Patient  Visit Type Follow-up;Spiritual support;Social support  Referral From Nurse  Consult/Referral To Chaplain  Chaplain visited with patient in response to OR, but daughter was not with patient. Chaplain talked to patient. Chaplain also offered empathy and prayer.

## 2019-04-27 NOTE — Progress Notes (Signed)
PT Cancellation Note  Patient Details Name: Miranda Ryan MRN: [id number] DOB: 1945/02/26   Cancelled Treatment:    Reason Eval/Treat Not Completed: Other (comment)   Pt stated she was excited to be returning home today.  She declined interventions stating "I just want to go home."     Chesley Noon 04/27/2019, 10:47 AM

## 2019-04-28 LAB — CULTURE, BLOOD (ROUTINE X 2)
Culture: NO GROWTH
Culture: NO GROWTH
Special Requests: ADEQUATE
Special Requests: ADEQUATE

## 2019-04-28 NOTE — Anesthesia Postprocedure Evaluation (Signed)
Anesthesia Post Note  Patient: Miranda Ryan  Procedure(s) Performed: IRRIGATION AND DEBRIDEMENT WOUND (N/A ) APPLICATION OF WOUND VAC (N/A )  Anesthesia Post Evaluation   Last Vitals:  Vitals:   04/27/19 1136 04/27/19 1139  BP: (!) 112/99   Pulse: 64 81  Resp:    Temp:    SpO2: (!) 82% 97%    Last Pain:  Vitals:   04/27/19 1441  TempSrc:   PainSc: 0-No pain                 Alphonsus Sias

## 2019-04-28 NOTE — Anesthesia Postprocedure Evaluation (Signed)
Anesthesia Post Note  Patient: Miranda Ryan  Procedure(s) Performed: IRRIGATION AND DEBRIDEMENT WOUND (N/A ) APPLICATION OF WOUND VAC (N/A )  Patient location during evaluation: PACU Anesthesia Type: MAC Level of consciousness: awake and alert Pain management: pain level controlled Vital Signs Assessment: post-procedure vital signs reviewed and stable Respiratory status: spontaneous breathing, nonlabored ventilation, respiratory function stable and patient connected to nasal cannula oxygen Cardiovascular status: stable and blood pressure returned to baseline Postop Assessment: no apparent nausea or vomiting Anesthetic complications: no     Last Vitals:  Vitals:   04/27/19 1136 04/27/19 1139  BP: (!) 112/99   Pulse: 64 81  Resp:    Temp:    SpO2: (!) 82% 97%    Last Pain:  Vitals:   04/27/19 1441  TempSrc:   PainSc: 0-No pain                 Alphonsus Sias

## 2019-05-10 ENCOUNTER — Emergency Department: Payer: Medicare Other

## 2019-05-10 ENCOUNTER — Telehealth: Payer: Self-pay

## 2019-05-10 ENCOUNTER — Emergency Department
Admission: EM | Admit: 2019-05-10 | Discharge: 2019-05-11 | Disposition: A | Discharge status: Other Acute Inpatient Hospital | Payer: Medicare Other | Attending: Emergency Medicine | Admitting: Emergency Medicine

## 2019-05-10 ENCOUNTER — Other Ambulatory Visit: Payer: Self-pay

## 2019-05-10 ENCOUNTER — Other Ambulatory Visit: Payer: Medicare Other | Admitting: Adult Health Nurse Practitioner

## 2019-05-10 DIAGNOSIS — F0390 Unspecified dementia without behavioral disturbance: Secondary | ICD-10-CM | POA: Diagnosis not present

## 2019-05-10 DIAGNOSIS — N179 Acute kidney failure, unspecified: Secondary | ICD-10-CM

## 2019-05-10 DIAGNOSIS — Z515 Encounter for palliative care: Secondary | ICD-10-CM

## 2019-05-10 DIAGNOSIS — Z79899 Other long term (current) drug therapy: Secondary | ICD-10-CM | POA: Insufficient documentation

## 2019-05-10 DIAGNOSIS — Z20822 Contact with and (suspected) exposure to covid-19: Secondary | ICD-10-CM | POA: Insufficient documentation

## 2019-05-10 DIAGNOSIS — M726 Necrotizing fasciitis: Secondary | ICD-10-CM | POA: Diagnosis not present

## 2019-05-10 DIAGNOSIS — E119 Type 2 diabetes mellitus without complications: Secondary | ICD-10-CM | POA: Insufficient documentation

## 2019-05-10 DIAGNOSIS — G9340 Encephalopathy, unspecified: Secondary | ICD-10-CM | POA: Diagnosis not present

## 2019-05-10 DIAGNOSIS — R319 Hematuria, unspecified: Secondary | ICD-10-CM | POA: Insufficient documentation

## 2019-05-10 DIAGNOSIS — M009 Pyogenic arthritis, unspecified: Secondary | ICD-10-CM

## 2019-05-10 DIAGNOSIS — I639 Cerebral infarction, unspecified: Secondary | ICD-10-CM

## 2019-05-10 DIAGNOSIS — R5383 Other fatigue: Secondary | ICD-10-CM | POA: Diagnosis present

## 2019-05-10 LAB — CBC WITH DIFFERENTIAL/PLATELET
Abs Immature Granulocytes: 0.26 10*3/uL — ABNORMAL HIGH (ref 0.00–0.07)
Basophils Absolute: 0.1 10*3/uL (ref 0.0–0.1)
Basophils Relative: 0 %
Eosinophils Absolute: 0 10*3/uL (ref 0.0–0.5)
Eosinophils Relative: 0 %
HCT: 34.8 % — ABNORMAL LOW (ref 36.0–46.0)
Hemoglobin: 10.5 g/dL — ABNORMAL LOW (ref 12.0–15.0)
Immature Granulocytes: 1 %
Lymphocytes Relative: 6 %
Lymphs Abs: 1.9 10*3/uL (ref 0.7–4.0)
MCH: 28.2 pg (ref 26.0–34.0)
MCHC: 30.2 g/dL (ref 30.0–36.0)
MCV: 93.5 fL (ref 80.0–100.0)
Monocytes Absolute: 1.3 10*3/uL — ABNORMAL HIGH (ref 0.1–1.0)
Monocytes Relative: 4 %
Neutro Abs: 28 10*3/uL — ABNORMAL HIGH (ref 1.7–7.7)
Neutrophils Relative %: 89 %
Platelets: 474 10*3/uL — ABNORMAL HIGH (ref 150–400)
RBC: 3.72 MIL/uL — ABNORMAL LOW (ref 3.87–5.11)
RDW: 20.6 % — ABNORMAL HIGH (ref 11.5–15.5)
Smear Review: NORMAL
WBC: 31.5 10*3/uL — ABNORMAL HIGH (ref 4.0–10.5)
nRBC: 0 % (ref 0.0–0.2)

## 2019-05-10 LAB — COMPREHENSIVE METABOLIC PANEL
ALT: 35 U/L (ref 0–44)
AST: 31 U/L (ref 15–41)
Albumin: 1.4 g/dL — ABNORMAL LOW (ref 3.5–5.0)
Alkaline Phosphatase: 581 U/L — ABNORMAL HIGH (ref 38–126)
Anion gap: 12 (ref 5–15)
BUN: 39 mg/dL — ABNORMAL HIGH (ref 8–23)
CO2: 20 mmol/L — ABNORMAL LOW (ref 22–32)
Calcium: 8.1 mg/dL — ABNORMAL LOW (ref 8.9–10.3)
Chloride: 106 mmol/L (ref 98–111)
Creatinine, Ser: 2.02 mg/dL — ABNORMAL HIGH (ref 0.44–1.00)
GFR calc Af Amer: 27 mL/min — ABNORMAL LOW (ref >60)
GFR calc non Af Amer: 24 mL/min — ABNORMAL LOW (ref >60)
Glucose, Bld: 91 mg/dL (ref 70–99)
Potassium: 5.9 mmol/L — ABNORMAL HIGH (ref 3.5–5.1)
Sodium: 138 mmol/L (ref 135–145)
Total Bilirubin: 0.5 mg/dL (ref 0.3–1.2)
Total Protein: 5.9 g/dL — ABNORMAL LOW (ref 6.5–8.1)

## 2019-05-10 LAB — URINALYSIS, COMPLETE (UACMP) WITH MICROSCOPIC
Bacteria, UA: NONE SEEN
Bilirubin Urine: NEGATIVE
Glucose, UA: NEGATIVE mg/dL
Hgb urine dipstick: NEGATIVE
Ketones, ur: NEGATIVE mg/dL
Leukocytes,Ua: NEGATIVE
Nitrite: NEGATIVE
Protein, ur: 100 mg/dL — AB
RBC / HPF: >50 RBC/hpf — ABNORMAL HIGH (ref 0–5)
Specific Gravity, Urine: 1.023 (ref 1.005–1.030)
Squamous Epithelial / HPF: NONE SEEN (ref 0–5)
pH: 5 (ref 5.0–8.0)

## 2019-05-10 LAB — LIPASE, BLOOD: Lipase: 14 U/L (ref 11–51)

## 2019-05-10 LAB — URINE DRUG SCREEN, QUALITATIVE (ARMC ONLY)
Amphetamines, Ur Screen: NOT DETECTED
Barbiturates, Ur Screen: NOT DETECTED
Benzodiazepine, Ur Scrn: NOT DETECTED
Cannabinoid 50 Ng, Ur ~~LOC~~: NOT DETECTED
Cocaine Metabolite,Ur ~~LOC~~: NOT DETECTED
MDMA (Ecstasy)Ur Screen: NOT DETECTED
Methadone Scn, Ur: NOT DETECTED
Opiate, Ur Screen: NOT DETECTED
Phencyclidine (PCP) Ur S: NOT DETECTED
Tricyclic, Ur Screen: NOT DETECTED

## 2019-05-10 LAB — PROCALCITONIN: Procalcitonin: 112.57 ng/mL

## 2019-05-10 LAB — AMMONIA: Ammonia: 28 umol/L (ref 9–35)

## 2019-05-10 LAB — LACTIC ACID, PLASMA: Lactic Acid, Venous: 2.3 mmol/L (ref 0.5–1.9)

## 2019-05-10 LAB — PROTIME-INR
INR: 1 (ref 0.8–1.2)
Prothrombin Time: 13.1 seconds (ref 11.4–15.2)

## 2019-05-10 LAB — TROPONIN I (HIGH SENSITIVITY): Troponin I (High Sensitivity): 22 ng/L — ABNORMAL HIGH (ref <18)

## 2019-05-10 IMAGING — DX DG CHEST 1V
1 series · 1 of 1 positions shown · non-contrast
Comparison: [DATE], CT [DATE]

CLINICAL DATA: PICC line placement

EXAM:
CHEST  1 VIEW

[chest ap]
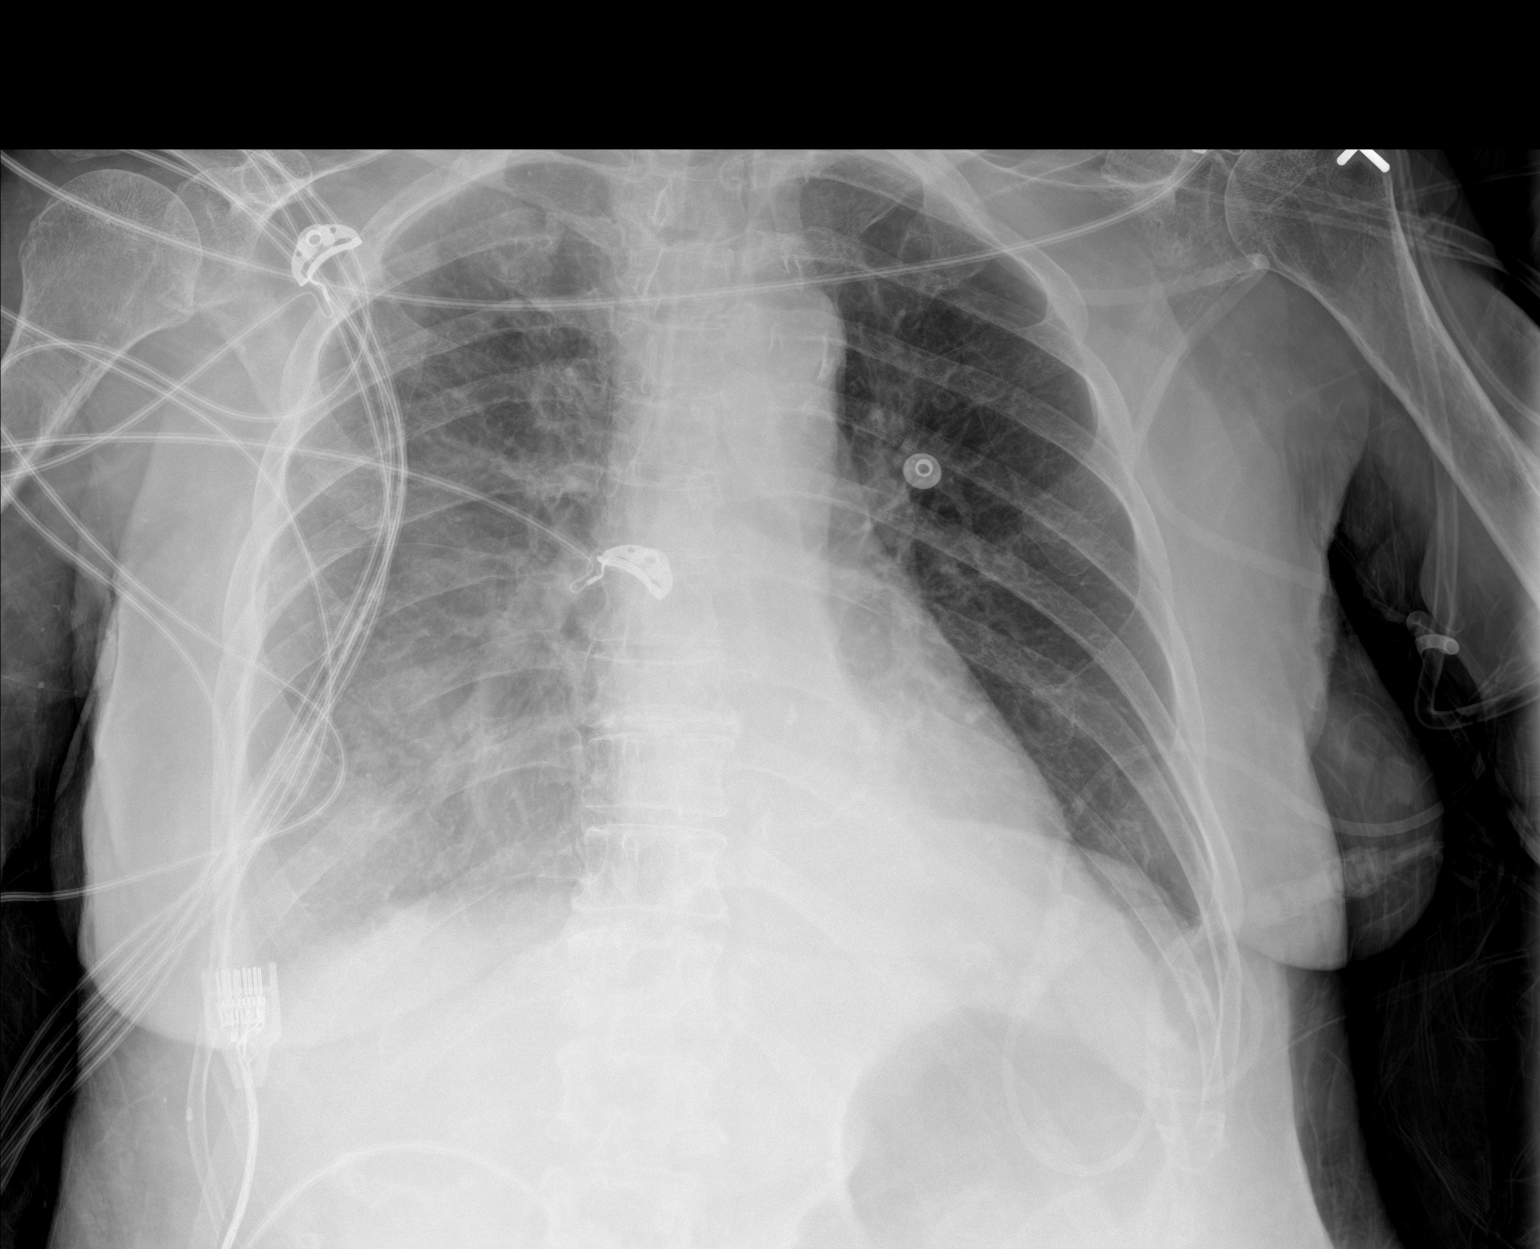

[1 of 1 positions shown; findings below may reference images not displayed]

FINDINGS: Interim diffuse hazy opacity of the right thorax likely due to
layering pleural effusion. Small left-sided pleural effusion. No
central venous catheter is evident on the current image. Stable
cardiomediastinal silhouette. Aortic atherosclerosis.
IMPRESSION: 1. No definitive central venous catheter identified on the current
image
2. Interim diffuse hazy opacity of the right thorax felt secondary
to layering moderate pleural effusion noted on the CT.
3. Small left-sided pleural effusion.

## 2019-05-10 IMAGING — CR DG CHEST 1V
1 series · 1 of 1 positions shown · non-contrast
Comparison: Radiograph [DATE]

CLINICAL DATA: Aphasia. Generalized weakness. Basilar crackles.

EXAM:
CHEST  1 VIEW

[dg chest 1 view]
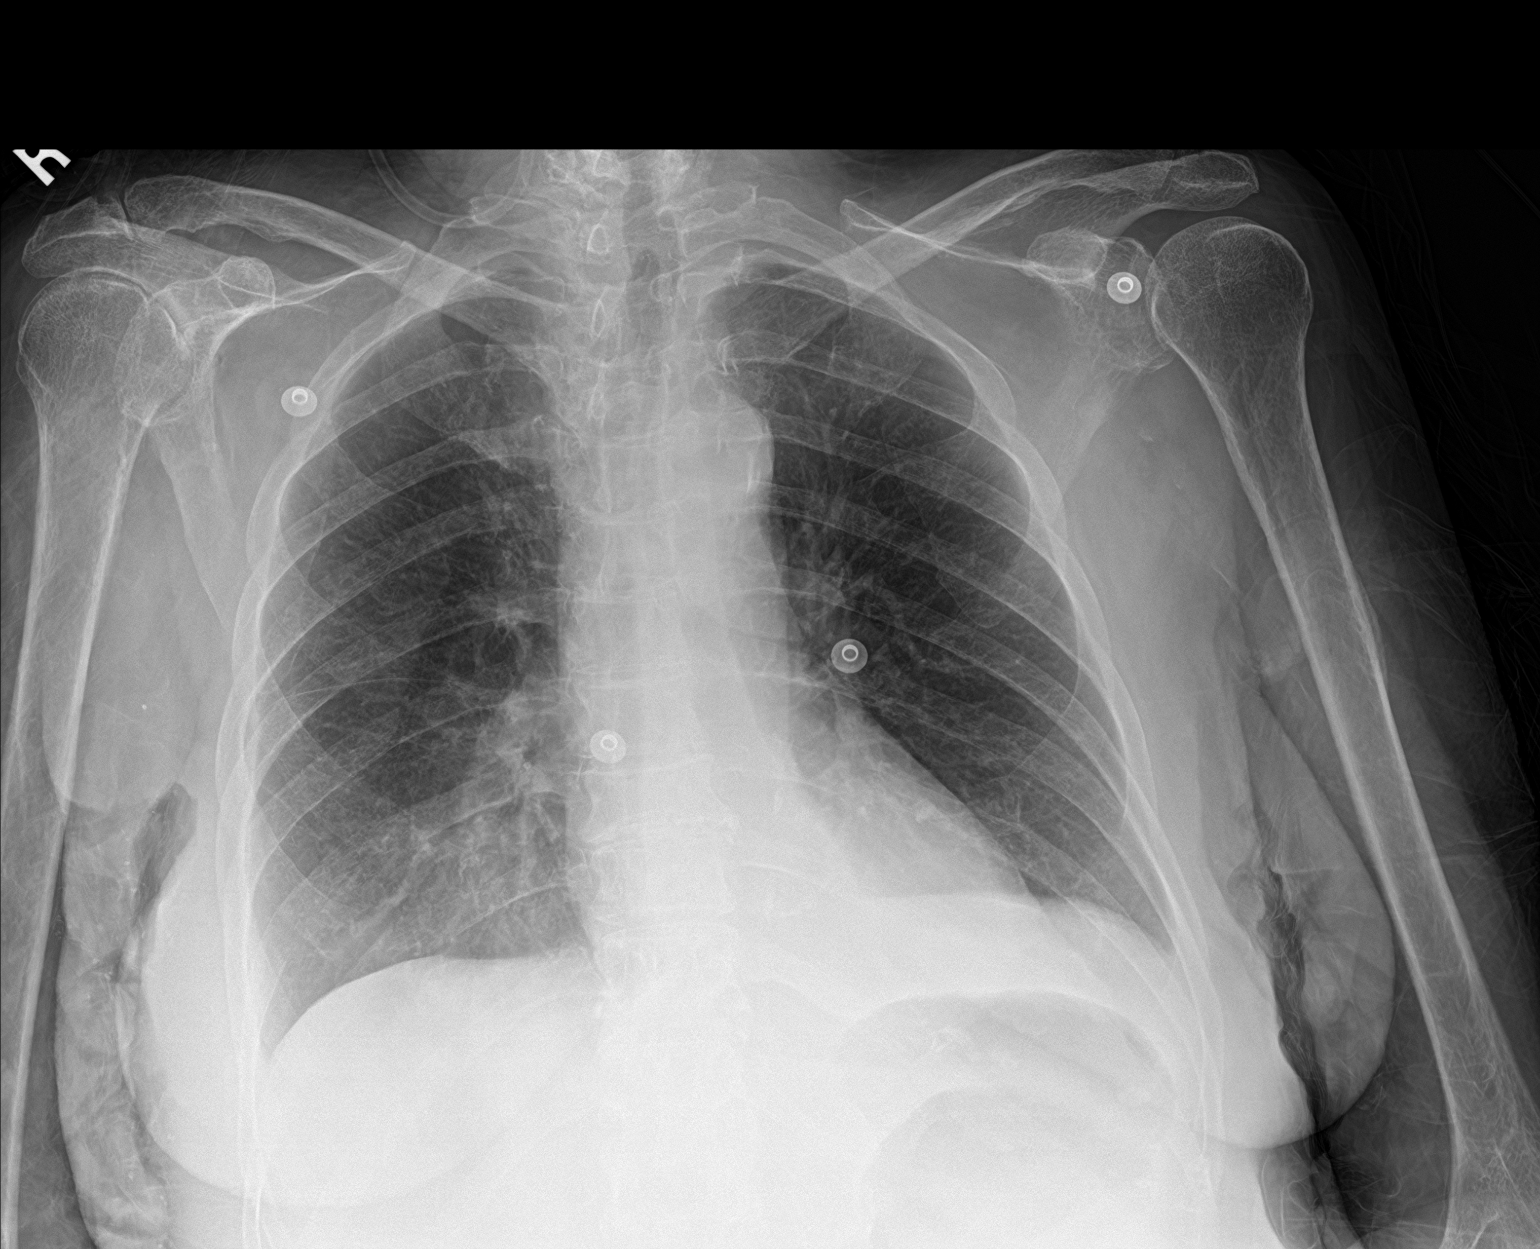

[1 of 1 positions shown; findings below may reference images not displayed]

FINDINGS: Improved patchy bibasilar opacities from prior exam. Minimal
residual subsegmental atelectasis in the right. Small left pleural
effusion that is new. Heart is normal in size with normal
mediastinal contours. Aortic atherosclerosis. No pulmonary edema or
pneumothorax. No acute osseous abnormalities are seen.
IMPRESSION: New small left pleural effusion. Improved patchy bibasilar opacities
from prior exam.

## 2019-05-10 IMAGING — CT CT RENAL STONE PROTOCOL
3 of 4 series · 7 of 46 positions shown, 13 images · non-contrast
Comparison: MRI [DATE]

CLINICAL DATA: Hematuria

EXAM:
CT ABDOMEN AND PELVIS WITHOUT CONTRAST
TECHNIQUE: Multidetector CT imaging of the abdomen and pelvis was performed
following the standard protocol without IV contrast.

[Series 4: lung bases · axial · 0.79mm/px · z∈[-454,-414]mm · 3 of 17 slices shown, 7 images]
[im 5/17  soft-tissue]
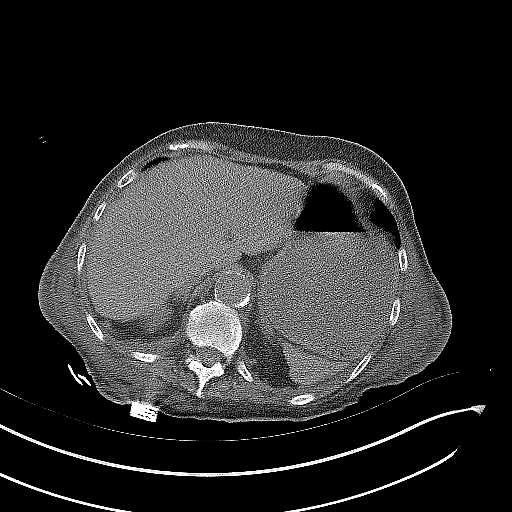
[im 5/17  lung]
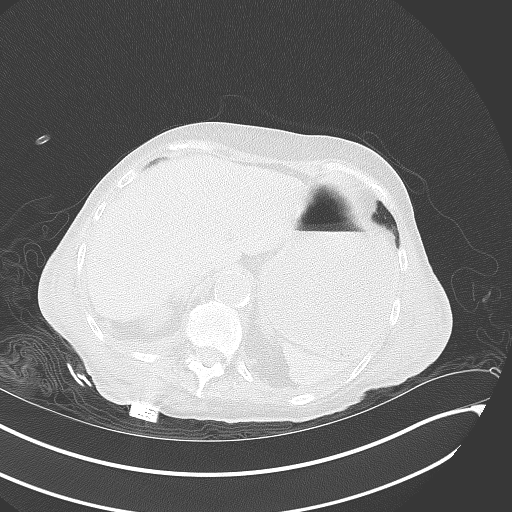
[im 5/17  bone]
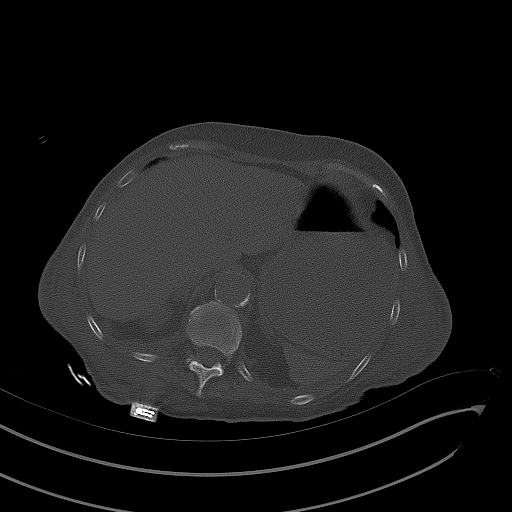
[im 9/17  soft-tissue]
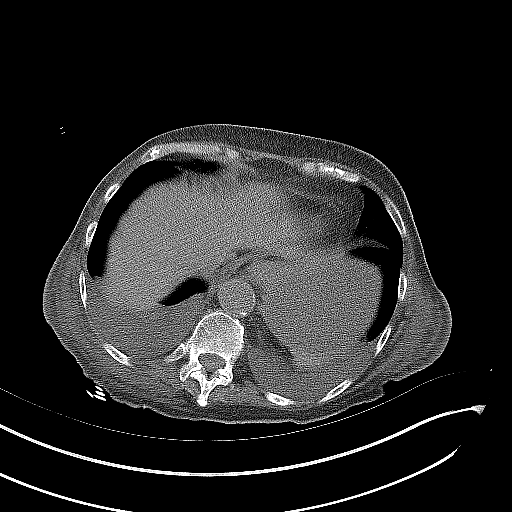
[im 9/17  lung]
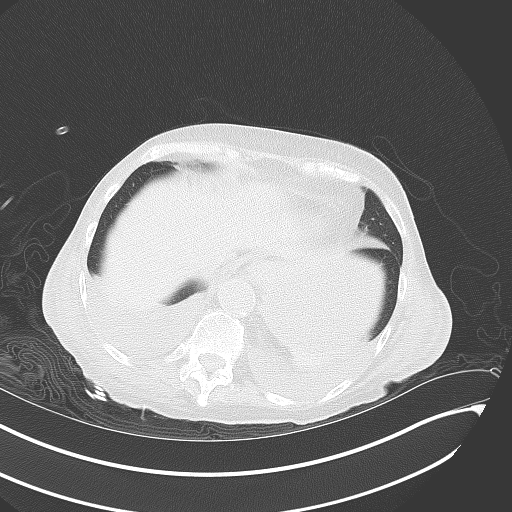
[im 13/17  soft-tissue]
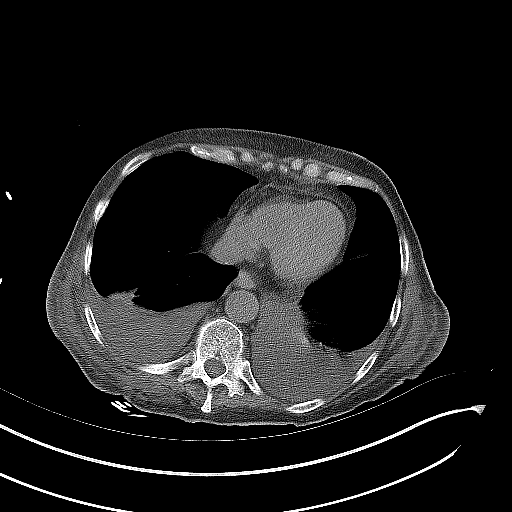
[im 13/17  lung]
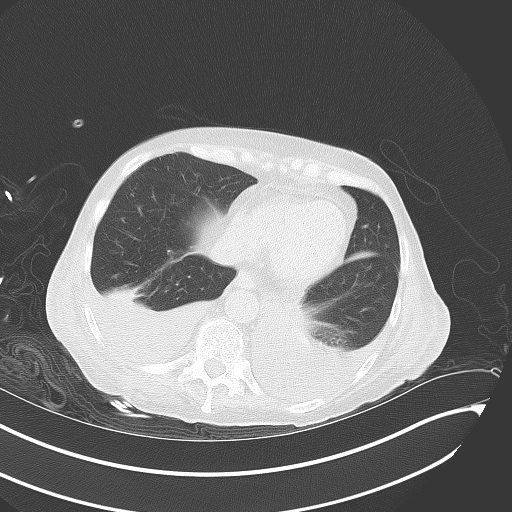

[Series 5: coronal · coronal · 0.68mm/px · 3 of 114 slices shown, 4 images]
[im 38/114  soft-tissue]
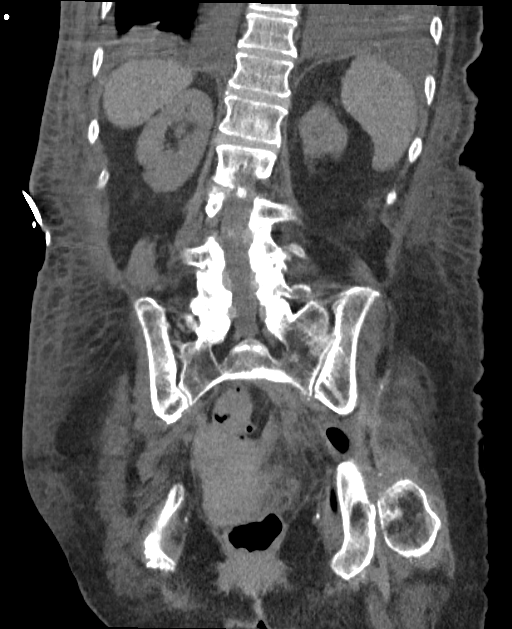
[im 51/114  soft-tissue]
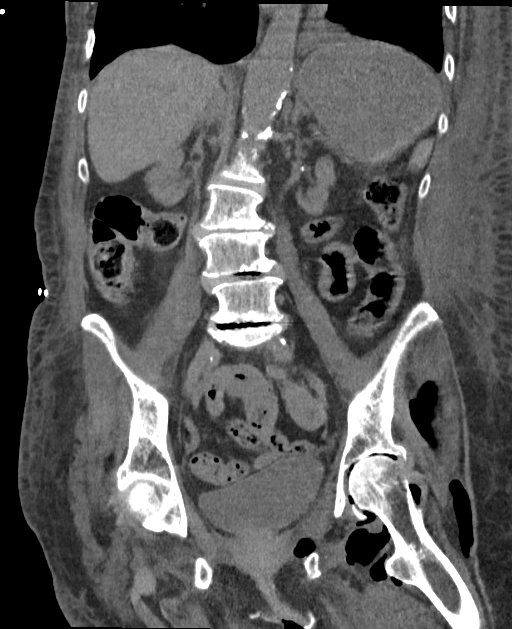
[im 51/114  bone]
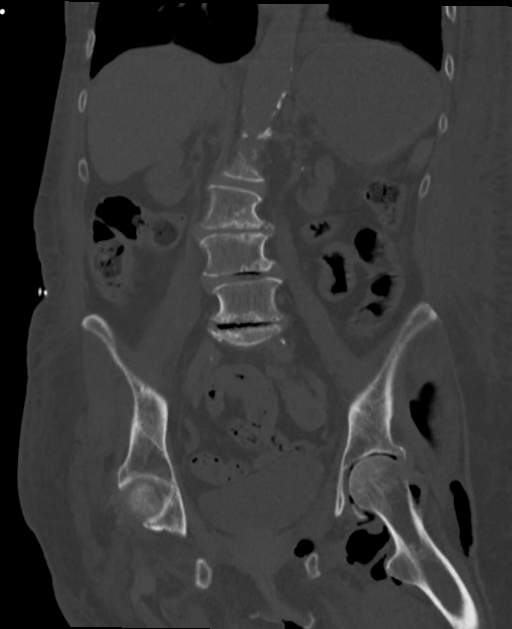
[im 63/114  soft-tissue]
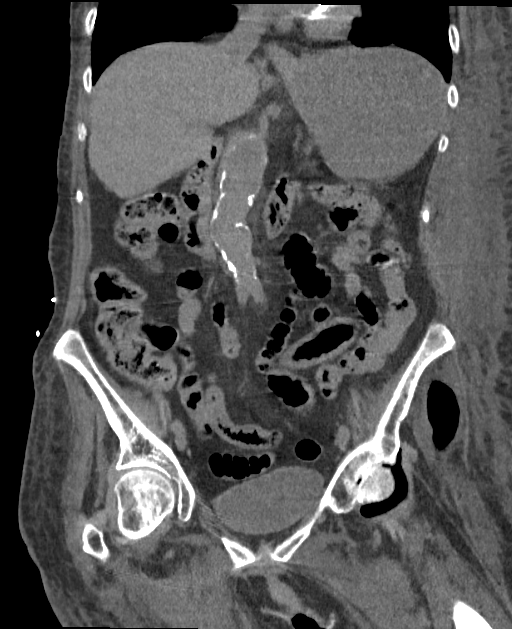

[Series 6: sagittal · sagittal · 0.52mm/px · 1 of 176 slices shown, 2 images]
[im 59/176  soft-tissue]
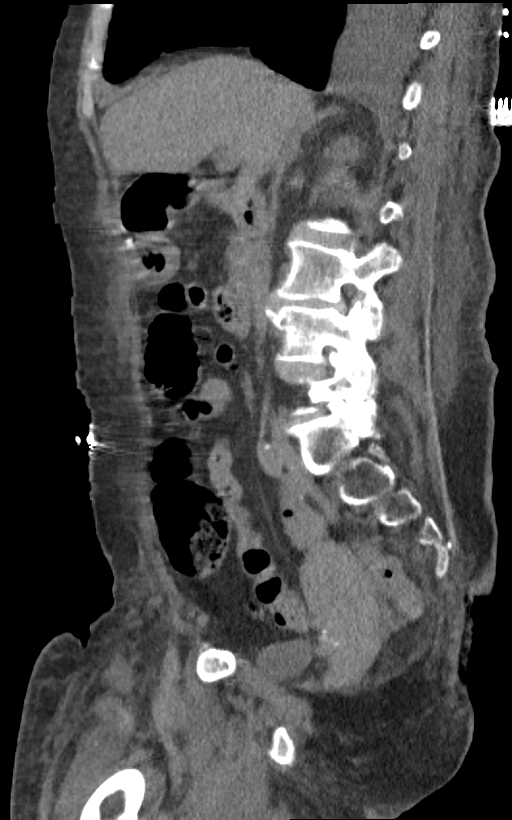
[im 59/176  bone]
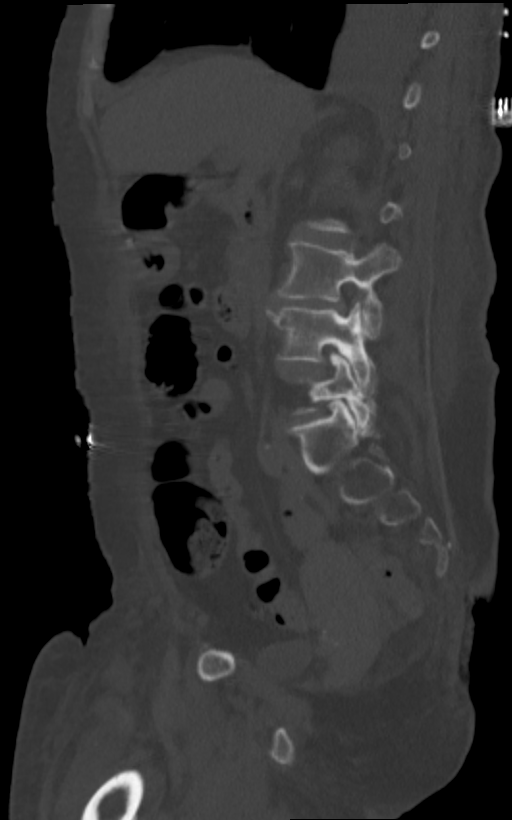

[7 of 46 positions shown; findings below may reference images not displayed]

FINDINGS: Lower chest: lung bases demonstrate moderate pleural effusions.
Dependent atelectasis at the bases. Normal heart size. Mitral
calcification.

Hepatobiliary: No focal hepatic abnormality. Increased density at
the fundus and periphery of the gallbladder possibly due to stones
or small sludge. No biliary dilatation

Pancreas: Marked fatty atrophy.  No inflammatory change

Spleen: Normal in size without focal abnormality.

Adrenals/Urinary Tract: Adrenal glands are normal. Atrophic left
kidney. Punctate stone in the lower pole of the left kidney. Small
stone in the left renal pelvis. Negative for hydronephrosis. The
bladder is unremarkable

Stomach/Bowel: The stomach is nonenlarged. No dilated small bowel.
No bowel wall thickening. Negative appendix.

Vascular/Lymphatic: Advanced aortic atherosclerosis. Diffuse
enlargement of the aorta with focal dilatation up to 3.3 cm at the
level of the renal arteries. No suspicious adenopathy.

Reproductive: Uterus and bilateral adnexa are unremarkable.

Other: Negative for free air or free fluid. Generalized edema within
the subcutaneous soft tissues. Fluid within the left flank region.

Musculoskeletal: Deep right sacral decubitus ulcer down to the
sacrococcygeal region. No gross bony destruction in this region. Gas
within the left hip joint with small effusion. Gas within the soft
tissues about the left hip. Intramuscular gas within the obturator
internus and gluteus muscles on the left.
IMPRESSION: 1. No CT evidence for acute intra-abdominal or intrapelvic
abnormality. Negative for hydronephrosis or ureteral stone. Small
stones in the atrophic left kidney.
2. Abnormal gas within the left hip joint and surrounding soft
tissues with intramuscular gas also present, in the absence of
recent instrumentation, findings raise concern for gas forming
infection of the left hip.
3. Deep right para median sacral decubitus ulcer without adjacent
bony changes of the sacrococcygeal region.
4. Possible sludge or stones in the gallbladder
5. Proximal abdominal aortic dilatation up to 3.3 cm. Recommend
followup by ultrasound in 3 years. This recommendation follows ACR
consensus guidelines: White Paper of the ACR Incidental Findings

## 2019-05-10 IMAGING — CT CT HEAD W/O CM
3 series · 16 of 47 positions shown, 19 images · non-contrast
Comparison: [DATE], [DATE]

CLINICAL DATA: Altered mental status

EXAM:
CT HEAD WITHOUT CONTRAST
TECHNIQUE: Contiguous axial images were obtained from the base of the skull
through the vertex without intravenous contrast.

[Series 3: head wo · axial · 0.42mm/px · z∈[-112,+13]mm · 10 of 30 slices shown, 13 images]
[im 3/30  brain]
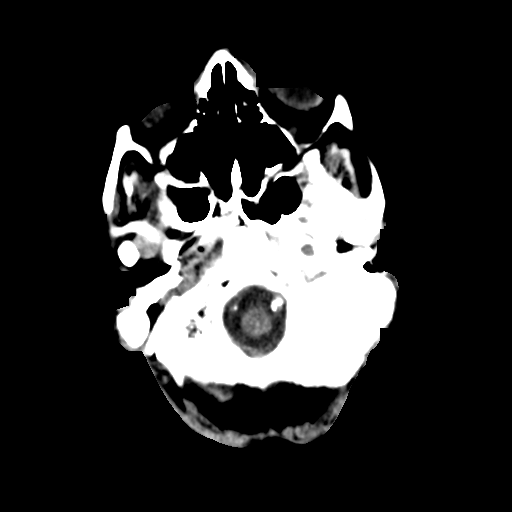
[im 3/30  bone]
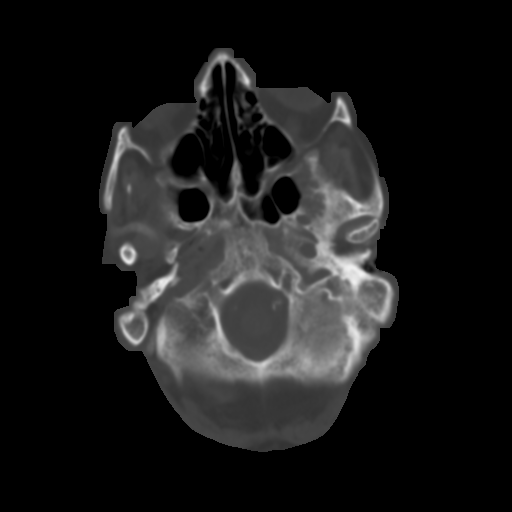
[im 6/30  brain]
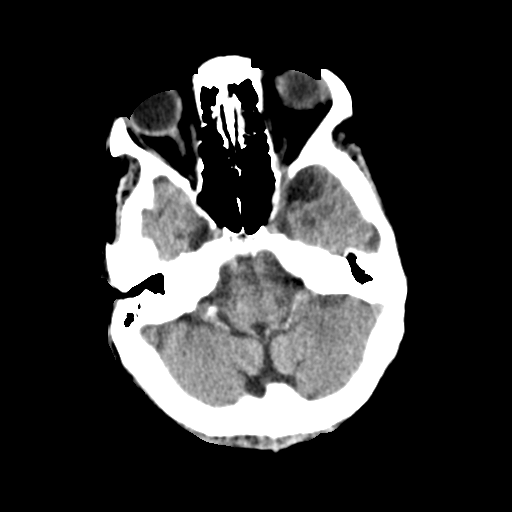
[im 9/30  brain]
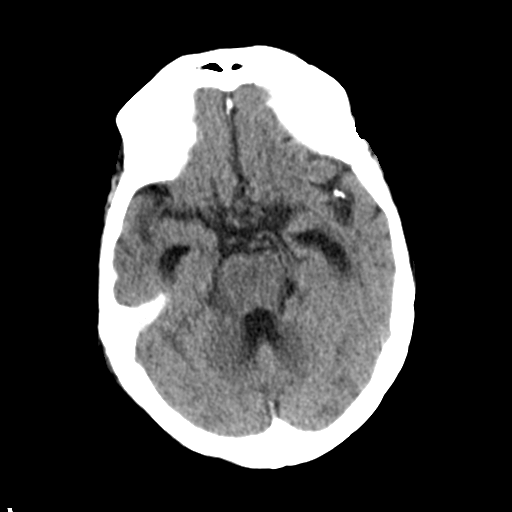
[im 11/30  brain]
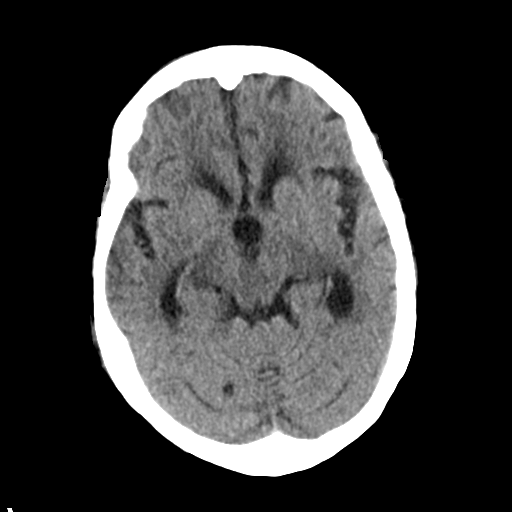
[im 14/30  brain]
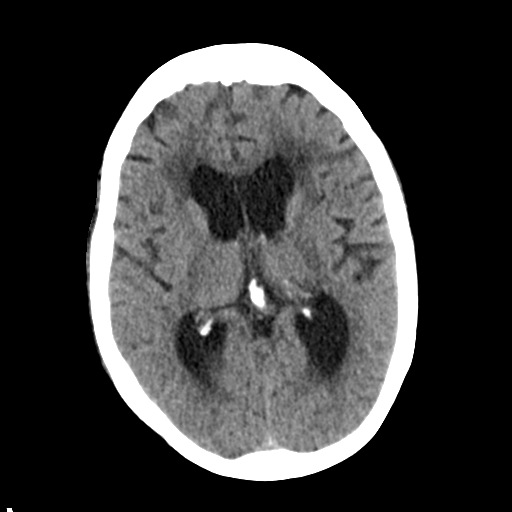
[im 14/30  bone]
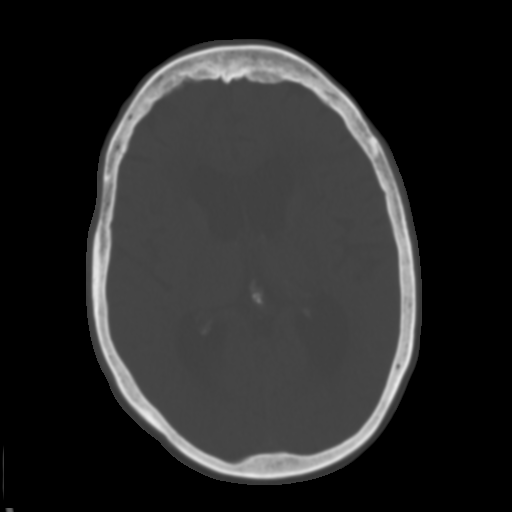
[im 17/30  brain]
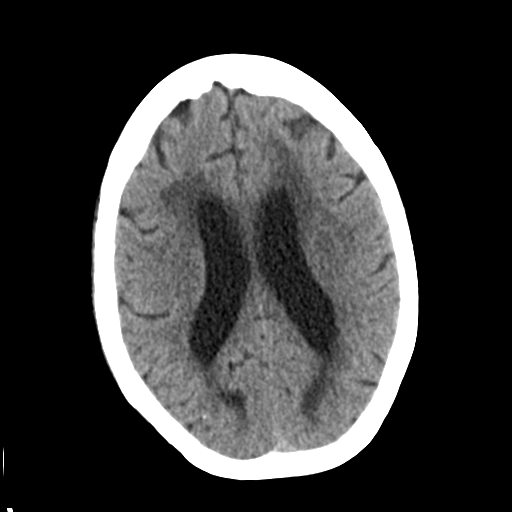
[im 20/30  brain]
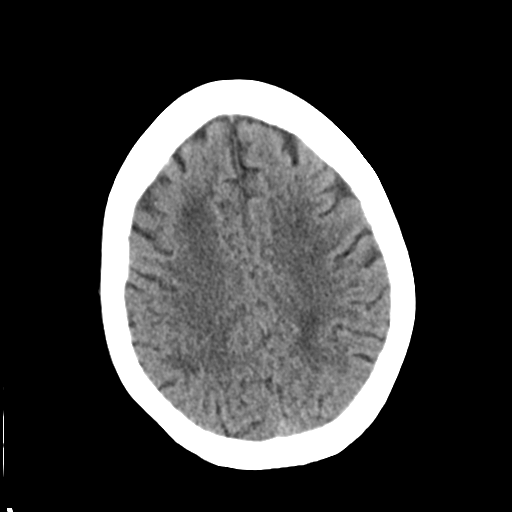
[im 23/30  brain]
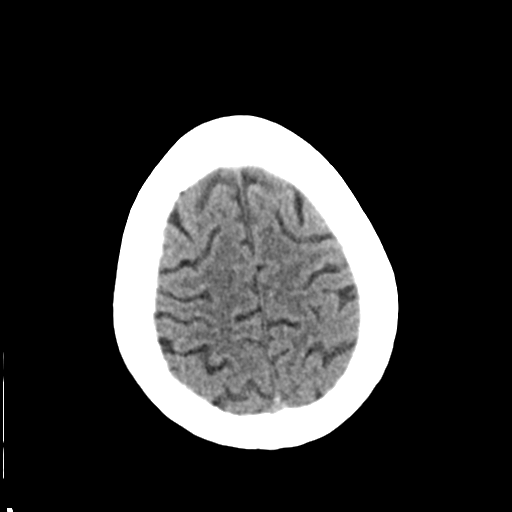
[im 25/30  brain]
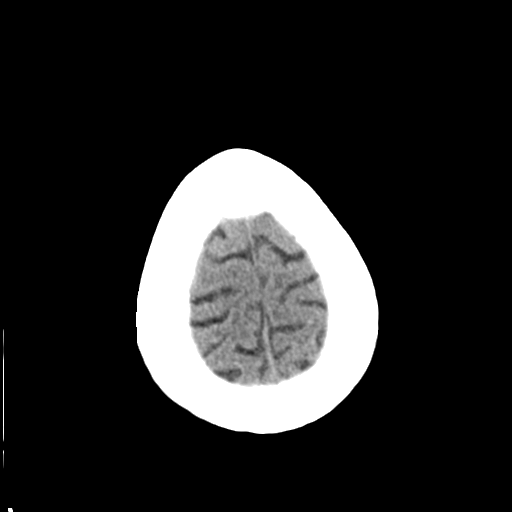
[im 25/30  bone]
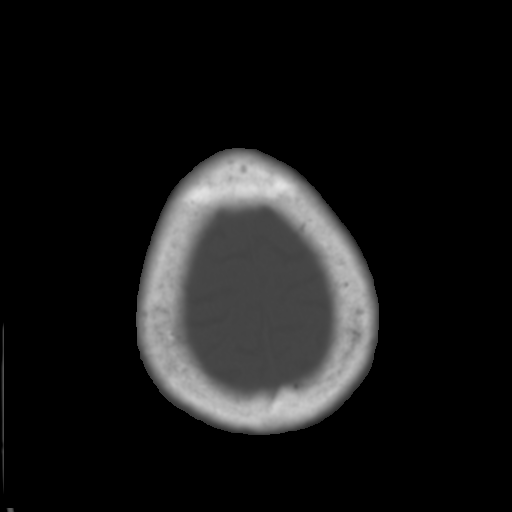
[im 28/30  brain]
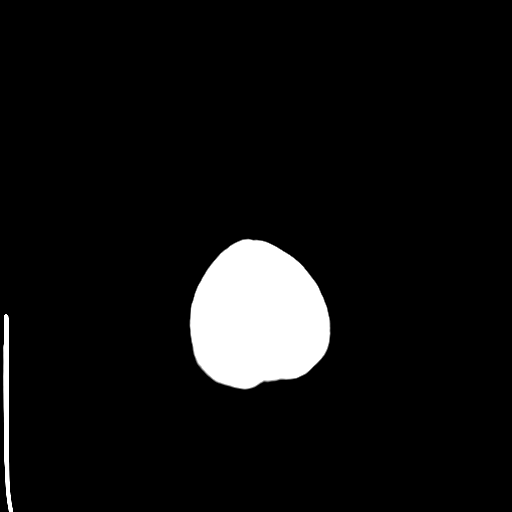

[Series 4: coronal soft tissue · coronal · 0.32mm/px · 3 of 66 slices shown]
[im 22/66  brain]
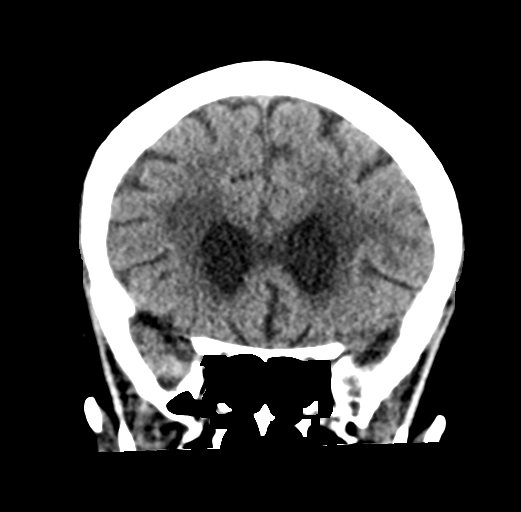
[im 29/66  brain]
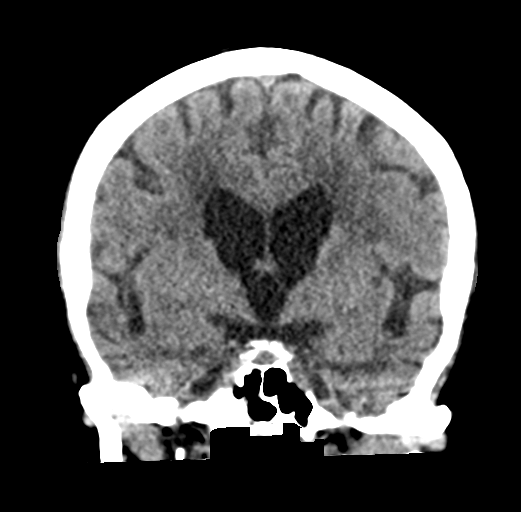
[im 37/66  brain]
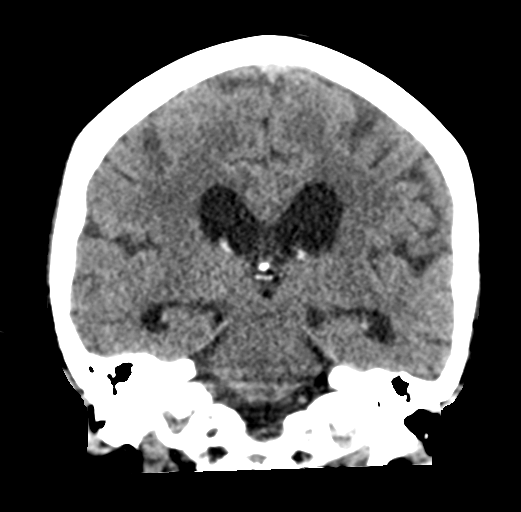

[Series 5: sagittal soft tissue · sagittal · 0.33mm/px · 3 of 49 slices shown]
[im 17/49  brain]
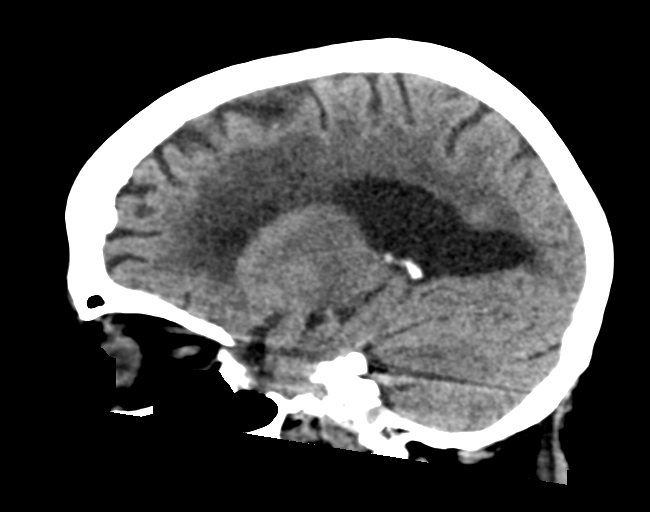
[im 25/49  brain]
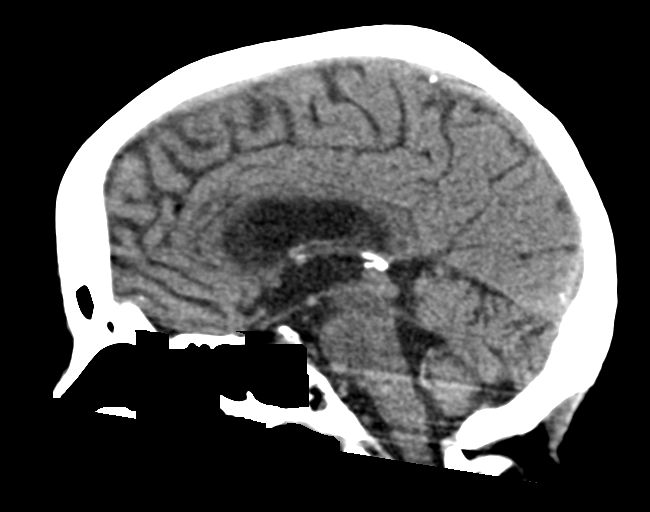
[im 33/49  brain]
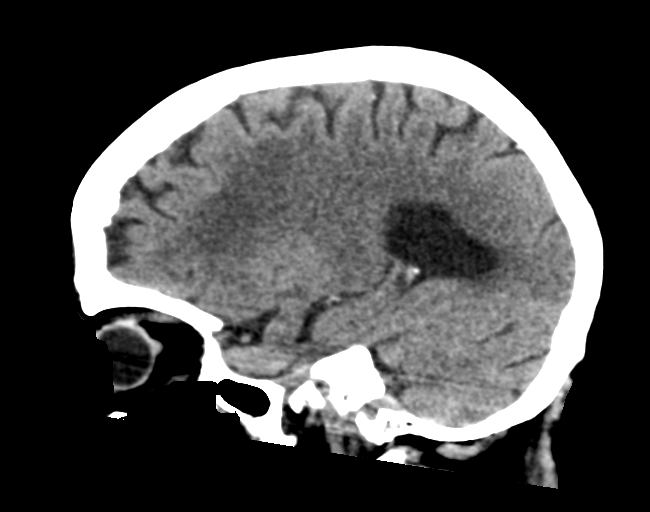

[16 of 47 positions shown; findings below may reference images not displayed]

FINDINGS: Brain: No evidence of acute infarction, hemorrhage, hydrocephalus,
extra-axial collection or mass lesion/mass effect. Extensive
low-density changes within the periventricular and subcortical white
matter compatible with chronic microvascular ischemic change.
Progressive low-density changes in the left deep cerebral white
matter at site of previously seen acute infarcts on [DATE].
Moderate diffuse cerebral volume loss.

Vascular: Atherosclerotic calcifications involving the large vessels
of the skull base. No unexpected hyperdense vessel.

Skull: Normal. Negative for fracture or focal lesion.

Sinuses/Orbits: No acute finding.

Other: None.
IMPRESSION: 1.  No acute intracranial findings.

2. Advanced chronic microvascular ischemic change and cerebral
volume loss.

## 2019-05-10 MED ORDER — SODIUM CHLORIDE 0.9% FLUSH: 10.0000 mL | INTRAVENOUS | Status: DC | PRN

## 2019-05-10 MED ORDER — SODIUM CHLORIDE 0.9% FLUSH: 10.0000 mL | Freq: Two times a day (BID) | INTRAVENOUS | Status: DC

## 2019-05-10 MED ORDER — VANCOMYCIN HCL IN DEXTROSE 1-5 GM/200ML-% IV SOLN
1000.0000 mg | Freq: Once | INTRAVENOUS | Status: AC
  Administered 2019-05-11: 02:00:00 1000 mg via INTRAVENOUS
  Filled 2019-05-10: qty 200

## 2019-05-10 MED ORDER — SODIUM CHLORIDE 0.9 % IV BOLUS
1000.0000 mL | Freq: Once | INTRAVENOUS | Status: AC
  Administered 2019-05-10: 1000 mL via INTRAVENOUS

## 2019-05-10 MED ORDER — PIPERACILLIN-TAZOBACTAM 3.375 G IVPB 30 MIN
3.3750 g | Freq: Once | INTRAVENOUS | Status: AC
  Administered 2019-05-11: 3.375 g via INTRAVENOUS
  Filled 2019-05-10: qty 50

## 2019-05-10 MED ORDER — CLINDAMYCIN PHOSPHATE 600 MG/50ML IV SOLN
600.0000 mg | Freq: Once | INTRAVENOUS | Status: AC
  Administered 2019-05-11: 600 mg via INTRAVENOUS
  Filled 2019-05-10 (×2): qty 50

## 2019-05-10 NOTE — ED Provider Notes (Addendum)
[facility] Emergency Department Provider Note  ____________________________________________  Time seen: Approximately 8:07 PM  I have reviewed the triage vital signs and the nursing notes.   HISTORY  Chief Complaint Fatigue    Level 5 Caveat: Portions of the History and Physical including HPI and review of systems are unable to be completely obtained due to patient being a poor historian   HPI Miranda Ryan is a 74 y.o. female with a history of stroke bipolar disorder dementia diabetes  who comes the ED due to very low energy level, not talking.  Apparently this is all changed since yesterday afternoon according to the daughter.  Not able to stand or get out of bed.  Daughter thought that she noticed some right facial drooping at home this morning.  Patient has also had difficulty swallowing since yesterday.  Daughter is unsure of onset of symptoms, but last known well was yesterday midday sometime.  Symptoms are constant, no aggravating or alleviating factors.     Past Medical History:  Diagnosis Date  . Acute CVA (cerebrovascular accident) ([place]) 02/22/2019  . Acute exacerbation of chronic obstructive pulmonary disease (COPD) ([place]) 04/20/2016  . Anxiety   . Bipolar disorder ([place])   . Dementia ([place])   . Depression   . Diabetes mellitus, type II ([place])   . Dysphagia 02/23/2019  . Incontinence of urine   . Low serum thyroid stimulating hormone (TSH) 02/23/2019  . Severe mitral valve stenosis 02/23/2019     Patient Active Problem List   Diagnosis Date Noted  . Hyperkalemia 04/23/2019  . Stroke ([place]) 04/23/2019  . HLD (hyperlipidemia) 04/23/2019  . COPD (chronic obstructive pulmonary disease) ([place]) 04/23/2019  . GERD (gastroesophageal reflux disease) 04/23/2019  . Depression 04/23/2019  . CKD (chronic kidney disease), stage IIIa 04/23/2019  . Diarrhea 04/23/2019  . Sacral decubitus ulcer, stage III ([place]) 04/23/2019  . Normocytic anemia 04/23/2019  .  Sepsis ([place]) 04/23/2019  . Malnutrition of moderate degree 04/05/2019  . Goals of care, counseling/discussion   . Palliative care by specialist   . Encounter for hospice care discussion   . Pressure injury of skin 04/04/2019  . Bacteremia   . Septic shock ([place]) 04/03/2019  . Dysphagia 02/23/2019  . Low serum thyroid stimulating hormone (TSH) 02/23/2019  . Severe mitral valve stenosis 02/23/2019  . Acute CVA (cerebrovascular accident) ([place]) 02/22/2019  . Generalized weakness   . Suspected UTI   . Ischemic stroke diagnosed during current admission ([place]) 02/21/2019  . Acute exacerbation of chronic obstructive pulmonary disease (COPD) ([place]) 04/20/2016     Past Surgical History:  Procedure Laterality Date  . APPLICATION OF WOUND VAC N/A 04/26/2019   Procedure: APPLICATION OF WOUND VAC;  Surgeon: Benjamine Sprague, DO;  Location: [facility] ORS;  Service: General;  Laterality: N/A;  . CERVIX SURGERY    . INCISION AND DRAINAGE OF WOUND N/A 04/26/2019   Procedure: IRRIGATION AND DEBRIDEMENT WOUND;  Surgeon: Benjamine Sprague, DO;  Location: [facility] ORS;  Service: General;  Laterality: N/A;     Prior to Admission medications   Medication Sig Start Date End Date Taking? Authorizing Provider  acetaminophen (TYLENOL) 325 MG tablet Take 2 tablets (650 mg total) by mouth every 6 (six) hours as needed for mild pain (or Fever >/= 101). 04/12/19  Yes Nolberto Hanlon, MD  acidophilus (RISAQUAD) CAPS capsule Take 1 capsule by mouth 3 (three) times daily. 04/12/19  Yes Nolberto Hanlon, MD  Amino Acids-Protein Hydrolys (FEEDING SUPPLEMENT, PRO-STAT SUGAR FREE 64,) LIQD  Take 30 mLs by mouth daily.   Yes [provider]  atorvastatin (LIPITOR) 40 MG tablet Take 1 tablet (40 mg total) by mouth daily at 6 PM. 02/24/19  Yes Samuella Cota, MD  busPIRone (BUSPAR) 10 MG tablet Take 10 mg by mouth 2 (two) times daily.   Yes [provider]  linagliptin (TRADJENTA) 5 MG TABS tablet Take 5 mg by mouth daily.   Yes  [provider]  metFORMIN (GLUCOPHAGE) 500 MG tablet Take 1 tablet (500 mg total) by mouth 2 (two) times daily with a meal. 02/24/19 02/24/20 Yes Samuella Cota, MD  metoprolol tartrate (LOPRESSOR) 25 MG tablet Take 1 tablet (25 mg total) by mouth 2 (two) times daily. 04/27/19  Yes Swayze, Ava, DO  Multiple Vitamin (MULTIVITAMIN WITH MINERALS) TABS tablet Take 1 tablet by mouth daily. 04/13/19  Yes Nolberto Hanlon, MD  nicotine (NICODERM CQ - DOSED IN MG/24 HOURS) 21 mg/24hr patch Place 1 patch (21 mg total) onto the skin daily. 04/27/19  Yes Swayze, Ava, DO  pantoprazole (PROTONIX) 40 MG tablet Take 1 tablet (40 mg total) by mouth daily. 04/13/19  Yes Nolberto Hanlon, MD  polyvinyl alcohol (LIQUIFILM TEARS) 1.4 % ophthalmic solution Place 1 drop into both eyes 4 (four) times daily as needed for dry eyes. 04/12/19  Yes Nolberto Hanlon, MD  traMADol (ULTRAM) 50 MG tablet Take 1 tablet (50 mg total) by mouth every 6 (six) hours as needed for moderate pain. 04/27/19  Yes Swayze, Ava, DO  zinc sulfate 220 (50 Zn) MG capsule Take 220 mg by mouth daily.   Yes [provider]  ascorbic acid (VITAMIN C) 500 MG tablet Take 1 tablet (500 mg total) by mouth 2 (two) times daily. 04/12/19   Nolberto Hanlon, MD  aspirin EC 81 MG EC tablet Take 1 tablet (81 mg total) by mouth daily. 02/25/19   Samuella Cota, MD  collagenase (SANTYL) ointment Apply topically 2 (two) times daily. 04/12/19   Nolberto Hanlon, MD  potassium chloride SA (KLOR-CON) 20 MEQ tablet Take 1 tablet (20 mEq total) by mouth daily for 1 day. 04/27/19 04/28/19  Swayze, Ava, DO     Allergies Demerol [meperidine]   Family History  Problem Relation Age of Onset  . Liver cancer Mother   . Heart attack Father   . Heart disease Sister   . Heart disease Brother   . Heart disease Brother   . Heart disease Brother     Social History Social History   Tobacco Use  . Smoking status: Current Every Day Smoker    Packs/day: 1.00    Years: 46.00     Pack years: 46.00    Types: Cigarettes    Start date: 09/16/1964  . Smokeless tobacco: Never Used  Substance Use Topics  . Alcohol use: Not Currently    Alcohol/week: 0.0 standard drinks    Comment: occasionally  . Drug use: No    Review of Systems Level 5 Caveat: Portions of the History and Physical including HPI and review of systems are unable to be completely obtained due to patient being a poor historian   Constitutional:   No known fever.  ENT:   No rhinorrhea. Cardiovascular:   No chest pain or syncope. Respiratory:   No dyspnea or cough. Gastrointestinal:   Negative for abdominal pain, vomiting and diarrhea.  Musculoskeletal:   Negative for focal pain or swelling ____________________________________________   PHYSICAL EXAM:  VITAL SIGNS: ED Triage Vitals  Enc Vitals  Group     BP 05/10/19 1518 95/76     Pulse Rate 05/10/19 1518 (!) 52     Resp 05/10/19 1518 16     Temp 05/10/19 1518 97.6 F (36.4 C)     Temp Source 05/10/19 1518 Oral     SpO2 05/10/19 1518 100 %     Weight 05/10/19 1537 115 lb (52.2 kg)     Height 05/10/19 1537 5\' 3"  (1.6 m)     Head Circumference --      Peak Flow --      Pain Score --      Pain Loc --      Pain Edu? --      Excl. in [place]? --     Vital signs reviewed, nursing assessments reviewed.   Constitutional: Awake, not alert, not oriented. Non-toxic appearance. Eyes:   Conjunctivae are normal. EOMI. PERRL. ENT      Head:   Normocephalic and atraumatic.      Nose:   No congestion/rhinnorhea.       Mouth/Throat:   Dry mucous membranes, no pharyngeal erythema. No peritonsillar mass.       Neck:   No meningismus. Full ROM. Hematological/Lymphatic/Immunilogical:   No cervical lymphadenopathy. Cardiovascular:   RRR. Symmetric bilateral radial and DP pulses.  No murmurs. Cap refill less than 2 seconds. Respiratory:   Normal respiratory effort without tachypnea/retractions.  Left base crackles, otherwise normal lung  exam.  Gastrointestinal:   Soft with mild suprapubic tenderness. Non distended. There is no CVA tenderness.  No rebound, rigidity, or guarding. Musculoskeletal:   Normal range of motion in all extremities. No joint effusions.  No lower extremity tenderness.  No edema. Neurologic:   No verbal communication.  Nods head yes and no to simple questioning.  Able to follow simple commands.  Motor grossly intact.   Skin:    Skin is warm, dry and intact. No rash noted.  No petechiae, purpura, or bullae.  ____________________________________________    LABS (pertinent positives/negatives) (all labs ordered are listed, but only abnormal results are displayed) Labs Reviewed  URINALYSIS, COMPLETE (UACMP) WITH MICROSCOPIC - Abnormal; Notable for the following components:      Result Value   Color, Urine AMBER (*)    APPearance TURBID (*)    Protein, ur 100 (*)    RBC / HPF >50 (*)    All other components within normal limits  CBC WITH DIFFERENTIAL/PLATELET - Abnormal; Notable for the following components:   WBC 31.5 (*)    RBC 3.72 (*)    Hemoglobin 10.5 (*)    HCT 34.8 (*)    RDW 20.6 (*)    Platelets 474 (*)    Neutro Abs 28.0 (*)    Monocytes Absolute 1.3 (*)    Abs Immature Granulocytes 0.26 (*)    All other components within normal limits  COMPREHENSIVE METABOLIC PANEL - Abnormal; Notable for the following components:   Potassium 5.9 (*)    CO2 20 (*)    BUN 39 (*)    Creatinine, Ser 2.02 (*)    Calcium 8.1 (*)    Total Protein 5.9 (*)    Albumin 1.4 (*)    Alkaline Phosphatase 581 (*)    GFR calc non Af Amer 24 (*)    GFR calc Af Amer 27 (*)    All other components within normal limits  LACTIC ACID, PLASMA - Abnormal; Notable for the following components:   Lactic Acid, Venous 2.3 (*)  All other components within normal limits  TROPONIN I (HIGH SENSITIVITY) - Abnormal; Notable for the following components:   Troponin I (High Sensitivity) 22 (*)    All other components  within normal limits  URINE CULTURE  RESPIRATORY PANEL BY RT PCR (FLU A&B, COVID)  CULTURE, BLOOD (ROUTINE X 2)  CULTURE, BLOOD (ROUTINE X 2)  URINE DRUG SCREEN, QUALITATIVE ([facility] ONLY)  LIPASE, BLOOD  AMMONIA  PROTIME-INR  PROCALCITONIN  LACTIC ACID, PLASMA  TROPONIN I (HIGH SENSITIVITY)   ____________________________________________   EKG  Interpreted by me Sinus rhythm rate of 68, normal axis and intervals.  Normal QRS ST segments and T waves.  ____________________________________________    [id number]  DG Chest 1 View  Result Date: 05/10/2019 CLINICAL DATA:  PICC line placement EXAM: CHEST  1 VIEW COMPARISON:  05/10/2019, CT 05/10/2019 FINDINGS: Interim diffuse hazy opacity of the right thorax likely due to layering pleural effusion. Small left-sided pleural effusion. No central venous catheter is evident on the current image. Stable cardiomediastinal silhouette. Aortic atherosclerosis. IMPRESSION: 1. No definitive central venous catheter identified on the current image 2. Interim diffuse hazy opacity of the right thorax felt secondary to layering moderate pleural effusion noted on the CT. 3. Small left-sided pleural effusion. Electronically Signed   By: Donavan Foil M.D.   On: 05/10/2019 23:11   DG Chest 1 View  Result Date: 05/10/2019 CLINICAL DATA:  Aphasia. Generalized weakness. Basilar crackles. EXAM: CHEST  1 VIEW COMPARISON:  Radiograph 04/23/2019 FINDINGS: Improved patchy bibasilar opacities from prior exam. Minimal residual subsegmental atelectasis in the right. Small left pleural effusion that is new. Heart is normal in size with normal mediastinal contours. Aortic atherosclerosis. No pulmonary edema or pneumothorax. No acute osseous abnormalities are seen. IMPRESSION: New small left pleural effusion. Improved patchy bibasilar opacities from prior exam. Electronically Signed   By: Keith Rake M.D.   On: 05/10/2019 16:11   CT Head Wo Contrast  Result Date:  05/10/2019 CLINICAL DATA:  Altered mental status EXAM: CT HEAD WITHOUT CONTRAST TECHNIQUE: Contiguous axial images were obtained from the base of the skull through the vertex without intravenous contrast. COMPARISON:  01/30/2019, 02/21/2019 FINDINGS: Brain: No evidence of acute infarction, hemorrhage, hydrocephalus, extra-axial collection or mass lesion/mass effect. Extensive low-density changes within the periventricular and subcortical white matter compatible with chronic microvascular ischemic change. Progressive low-density changes in the left deep cerebral white matter at site of previously seen acute infarcts on 02/21/2019. Moderate diffuse cerebral volume loss. Vascular: Atherosclerotic calcifications involving the large vessels of the skull base. No unexpected hyperdense vessel. Skull: Normal. Negative for fracture or focal lesion. Sinuses/Orbits: No acute finding. Other: None. IMPRESSION: 1.  No acute intracranial findings. 2. Advanced chronic microvascular ischemic change and cerebral volume loss. Electronically Signed   By: Davina Poke D.O.   On: 05/10/2019 16:16   CT Renal Stone Study  Result Date: 05/10/2019 CLINICAL DATA:  Hematuria EXAM: CT ABDOMEN AND PELVIS WITHOUT CONTRAST TECHNIQUE: Multidetector CT imaging of the abdomen and pelvis was performed following the standard protocol without IV contrast. COMPARISON:  MRI 04/24/2019 FINDINGS: Lower chest: lung bases demonstrate moderate pleural effusions. Dependent atelectasis at the bases. Normal heart size. Mitral calcification. Hepatobiliary: No focal hepatic abnormality. Increased density at the fundus and periphery of the gallbladder possibly due to stones or small sludge. No biliary dilatation Pancreas: Marked fatty atrophy.  No inflammatory change Spleen: Normal in size without focal abnormality. Adrenals/Urinary Tract: Adrenal glands are normal. Atrophic left kidney. Punctate stone in the  lower pole of the left kidney. Small stone in the  left renal pelvis. Negative for hydronephrosis. The bladder is unremarkable Stomach/Bowel: The stomach is nonenlarged. No dilated small bowel. No bowel wall thickening. Negative appendix. Vascular/Lymphatic: Advanced aortic atherosclerosis. Diffuse enlargement of the aorta with focal dilatation up to 3.3 cm at the level of the renal arteries. No suspicious adenopathy. Reproductive: Uterus and bilateral adnexa are unremarkable. Other: Negative for free air or free fluid. Generalized edema within the subcutaneous soft tissues. Fluid within the left flank region. Musculoskeletal: Deep right sacral decubitus ulcer down to the sacrococcygeal region. No gross bony destruction in this region. Gas within the left hip joint with small effusion. Gas within the soft tissues about the left hip. Intramuscular gas within the obturator internus and gluteus muscles on the left. IMPRESSION: 1. No CT evidence for acute intra-abdominal or intrapelvic abnormality. Negative for hydronephrosis or ureteral stone. Small stones in the atrophic left kidney. 2. Abnormal gas within the left hip joint and surrounding soft tissues with intramuscular gas also present, in the absence of recent instrumentation, findings raise concern for gas forming infection of the left hip. 3. Deep right para median sacral decubitus ulcer without adjacent bony changes of the sacrococcygeal region. 4. Possible sludge or stones in the gallbladder 5. 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 Recommend followup by ultrasound in 3 years. This recommendation follows ACR consensus guidelines: White Paper of the ACR Incidental Findings Committee II on Vascular Findings. [employer] Radiol 2013; 10:789-794 Electronically Signed   By: Donavan Foil M.D.   On: 05/10/2019 21:49    ____________________________________________   PROCEDURES Angiocath insertion  Date/Time: 05/10/2019 8:12 PM Performed by: Carrie Mew, MD Authorized by: Carrie Mew,  MD  Consent: The procedure was performed in an emergent situation. Preparation: Patient was prepped and draped in the usual sterile fashion. Local anesthesia used: no  Anesthesia: Local anesthesia used: no  Sedation: Patient sedated: no  Patient tolerance: patient tolerated the procedure well with no immediate complications Comments:      .Critical Care Performed by: Carrie Mew, MD Authorized by: Carrie Mew, MD   Critical care provider statement:    Critical care time (minutes):  45   Critical care time was exclusive of:  Separately billable procedures and treating other patients   Critical care was necessary to treat or prevent imminent or life-threatening deterioration of the following conditions:  Renal failure and sepsis   Critical care was time spent personally by me on the following activities:  Development of treatment plan with patient or surrogate, discussions with consultants, evaluation of patient's response to treatment, examination of patient, obtaining history from patient or surrogate, ordering and performing treatments and interventions, ordering and review of laboratory studies, ordering and review of radiographic studies, pulse oximetry, re-evaluation of patient's condition and review of old charts    ____________________________________________  DIFFERENTIAL DIAGNOSIS   Stroke, intracranial hemorrhage, dehydration, UTI, pneumonia, delirium, electrolyte abnormality, progressive dementia  CLINICAL IMPRESSION / ASSESSMENT AND PLAN / ED COURSE  Medications ordered in the ED: Medications  sodium chloride flush (NS) 0.9 % injection 10-40 mL (has no administration in time range)  sodium chloride flush (NS) 0.9 % injection 10-40 mL (has no administration in time range)  piperacillin-tazobactam (ZOSYN) IVPB 3.375 g (has no administration in time range)  vancomycin (VANCOCIN) IVPB 1000 mg/200 mL premix (has no administration in time range)   clindamycin (CLEOCIN) IVPB 600 mg (has no administration in time range)  sodium chloride  0.9 % bolus 1,000 mL (0 mLs Intravenous Stopped 05/10/19 2313)    Pertinent labs & imaging results that were available during my care of the patient were reviewed by me and considered in my medical decision making (see chart for details).   Miranda Ryan was evaluated in Emergency Department on 05/11/2019 for the symptoms described in the history of present illness. She was evaluated in the context of the global COVID-19 pandemic, which necessitated consideration that the patient might be at risk for infection with the SARS-CoV-2 virus that causes COVID-19. Institutional protocols and algorithms that pertain to the evaluation of patients at risk for COVID-19 are in a state of rapid change based on information released by regulatory bodies including the CDC and federal and state organizations. These policies and algorithms were followed during the patient's care in the ED.   Patient presents with decreased energy level, decreased verbal communication.  Observations at home suspicious for possible stroke although in the ED, cranial nerves appear intact without focal neurologic findings on exam.  CT scan of the head obtained, negative for acute bleeding or infarct.  Awaiting labs for further assessment.  Will give IV fluids for hydration given clinically apparent dehydration on exam.  Vital signs are essentially normal, she is not septic.  Clinical Course as of May 10 5  Thu May 10, 2019  [id number] Due to multiple failed attempts at lab draw by nursing, lab and ID consult team, even after placing a PICC line, I used bedside ultrasound to obtain labs through a straight stick.  Tolerated well, no apparent complications.   [PS]  2012 Lactate is elevated at 2.3 which I think is due to dehydration.  Not septic.  Lactic acid, plasma(!!) [PS]  2012 Troponin is baseline chronic mild elevation.  Creatinine of 2.0 represents  AKI compared to baseline of 0.9-1.0 for creatinine.   [PS]  2151 Lab just resulted the white blood cell count of 31,000.  Vital signs are unremarkable and not consistent with sepsis.  Urinalysis is not consistent with sepsis, negative for bacteria.  No evidence of pneumonia on chest x-ray.  No evidence of skin or soft tissue infection.  She does have AKI which I think is due to dehydration.  Awaiting CT scan result.   [PS]  2240 Case discussed with surgery Dr. Peyton Najjar who agrees with MRI pelvis for further assessment, IV antibiotics.  Case also discussed with Dr. Mack Guise of orthopedics, given the vagueness of clinical exam, absence of fever, agrees with MRI but would not proceed with emergent exploratory surgery.  I will start vancomycin, Zosyn, clindamycin, MRI ordered, plan to admit.   [PS]  2321 Follow up discussion with Dr. Mack Guise - he recommends transfer to tertiary care center for possible nec fas in pelvis due to limitations of this facility's capabilities. Procal markedly elevated at 112.   [PS]  Fri May 11, 2019  0006 Case discussed with [facility] transfer center who advised they have no surgical beds.  Case discussed with [facility] transfer center, waiting to hear back.   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 Dictation errors may occur despite best attempts at proofreading.   Carrie Mew, MD 05/10/19 2257    Carrie Mew, MD 05/11/19 [phone]

## 2019-05-10 NOTE — Consult Note (Signed)
ORTHOPAEDIC CONSULTATION  REQUESTING PHYSICIAN: Carrie Mew, MD  Chief Complaint:  Gas around left hip and pelvis  HPI:   Called by Dr. Joni Fears from the [facility] ED regarding Miranda Ryan who is a 74 y.o. female with a history of CVA (12/20), COPD, DM type II and dementia who presents to ED with increasing lethargy. Patient has been hospitalized twice at [facility] since February.  She was hospitalized from 2/9-2/18 due to septic shock secondary to bacteremia Tree surgeon. Facael) from decubitus ulcer.The patient continued on IVUnasyn till 2/25.  Patient was readmitted on 3/1 and was treated by Dr. Lysle Pearl, from general surgery for a worsening sacral ulcer with surgical debridement and wound vac placement.  Today the patient returns to the ER with increased lethargy.  WBC is 31.5.  Lactice acid is 2.3.  Creatinine 2.02.  Troponin 22.  A CT Renal Stone Study was ordered for hematuria and found to have abnormal gas within the left hip and surrounding soft tissues.  Orthopaedics was called regarding the gas in the left hip.  Patient is reported by Dr. Joni Fears not to have any wounds over the left hip or cutaneous findings such as significant swelling or erythema.   He explains the pain does not demonstrate significant pain with movement of the left hip.  Past Medical History:  Diagnosis Date  . Acute CVA (cerebrovascular accident) ([place]) 02/22/2019  . Acute exacerbation of chronic obstructive pulmonary disease (COPD) ([place]) 04/20/2016  . Anxiety   . Bipolar disorder ([place])   . Dementia ([place])   . Depression   . Diabetes mellitus, type II ([place])   . Dysphagia 02/23/2019  . Incontinence of urine   . Low serum thyroid stimulating hormone (TSH) 02/23/2019  . Severe mitral valve stenosis 02/23/2019   Past Surgical History:  Procedure Laterality Date  . APPLICATION OF WOUND VAC N/A 04/26/2019   Procedure: APPLICATION OF WOUND VAC;  Surgeon: Benjamine Sprague, DO;  Location: [facility] ORS;  Service: General;  Laterality: N/A;   . CERVIX SURGERY    . INCISION AND DRAINAGE OF WOUND N/A 04/26/2019   Procedure: IRRIGATION AND DEBRIDEMENT WOUND;  Surgeon: Benjamine Sprague, DO;  Location: [facility] ORS;  Service: General;  Laterality: N/A;   Social History   Socioeconomic History  . Marital status: Widowed    Spouse name: Not on file  . Number of children: Not on file  . Years of education: Not on file  . Highest education level: Not on file  Occupational History  . Not on file  Tobacco Use  . Smoking status: Current Every Day Smoker    Packs/day: 1.00    Years: 46.00    Pack years: 46.00    Types: Cigarettes    Start date: 09/16/1964  . Smokeless tobacco: Never Used  Substance and Sexual Activity  . Alcohol use: Not Currently    Alcohol/week: 0.0 standard drinks    Comment: occasionally  . Drug use: No  . Sexual activity: Not Currently  Other Topics Concern  . Not on file  Social History Narrative  . Not on file   Social Determinants of Health   Financial Resource Strain:   . Difficulty of Paying Living Expenses:   Food Insecurity:   . Worried About Charity fundraiser in the Last Year:   . Arboriculturist in the Last Year:   Transportation Needs:   . Film/video editor (Medical):   Marland Kitchen Lack of Transportation (Non-Medical):   Physical Activity:   .  Days of Exercise per Week:   . Minutes of Exercise per Session:   Stress:   . Feeling of Stress :   Social Connections:   . Frequency of Communication with Friends and Family:   . Frequency of Social Gatherings with Friends and Family:   . Attends Religious Services:   . Active Member of Clubs or Organizations:   . Attends Archivist Meetings:   Marland Kitchen Marital Status:    Family History  Problem Relation Age of Onset  . Liver cancer Mother   . Heart attack Father   . Heart disease Sister   . Heart disease Brother   . Heart disease Brother   . Heart disease Brother    Allergies  Allergen Reactions  . Demerol [Meperidine] Other (See  Comments)    Pt states she "turned green"    Prior to Admission medications   Medication Sig Start Date End Date Taking? Authorizing Provider  acetaminophen (TYLENOL) 325 MG tablet Take 2 tablets (650 mg total) by mouth every 6 (six) hours as needed for mild pain (or Fever >/= 101). 04/12/19  Yes Nolberto Hanlon, MD  acidophilus (RISAQUAD) CAPS capsule Take 1 capsule by mouth 3 (three) times daily. 04/12/19  Yes Nolberto Hanlon, MD  Amino Acids-Protein Hydrolys (FEEDING SUPPLEMENT, PRO-STAT SUGAR FREE 64,) LIQD Take 30 mLs by mouth daily.   Yes [provider]  atorvastatin (LIPITOR) 40 MG tablet Take 1 tablet (40 mg total) by mouth daily at 6 PM. 02/24/19  Yes Samuella Cota, MD  busPIRone (BUSPAR) 10 MG tablet Take 10 mg by mouth 2 (two) times daily.   Yes [provider]  linagliptin (TRADJENTA) 5 MG TABS tablet Take 5 mg by mouth daily.   Yes [provider]  metFORMIN (GLUCOPHAGE) 500 MG tablet Take 1 tablet (500 mg total) by mouth 2 (two) times daily with a meal. 02/24/19 02/24/20 Yes Samuella Cota, MD  metoprolol tartrate (LOPRESSOR) 25 MG tablet Take 1 tablet (25 mg total) by mouth 2 (two) times daily. 04/27/19  Yes Swayze, Ava, DO  Multiple Vitamin (MULTIVITAMIN WITH MINERALS) TABS tablet Take 1 tablet by mouth daily. 04/13/19  Yes Nolberto Hanlon, MD  nicotine (NICODERM CQ - DOSED IN MG/24 HOURS) 21 mg/24hr patch Place 1 patch (21 mg total) onto the skin daily. 04/27/19  Yes Swayze, Ava, DO  pantoprazole (PROTONIX) 40 MG tablet Take 1 tablet (40 mg total) by mouth daily. 04/13/19  Yes Nolberto Hanlon, MD  polyvinyl alcohol (LIQUIFILM TEARS) 1.4 % ophthalmic solution Place 1 drop into both eyes 4 (four) times daily as needed for dry eyes. 04/12/19  Yes Nolberto Hanlon, MD  traMADol (ULTRAM) 50 MG tablet Take 1 tablet (50 mg total) by mouth every 6 (six) hours as needed for moderate pain. 04/27/19  Yes Swayze, Ava, DO  zinc sulfate 220 (50 Zn) MG capsule Take 220 mg by mouth daily.    Yes [provider]  ascorbic acid (VITAMIN C) 500 MG tablet Take 1 tablet (500 mg total) by mouth 2 (two) times daily. 04/12/19   Nolberto Hanlon, MD  aspirin EC 81 MG EC tablet Take 1 tablet (81 mg total) by mouth daily. 02/25/19   Samuella Cota, MD  collagenase (SANTYL) ointment Apply topically 2 (two) times daily. 04/12/19   Nolberto Hanlon, MD  potassium chloride SA (KLOR-CON) 20 MEQ tablet Take 1 tablet (20 mEq total) by mouth daily for 1 day. 04/27/19 04/28/19  Swayze, Ava, DO   DG  Chest 1 View  Result Date: 05/10/2019 CLINICAL DATA:  PICC line placement EXAM: CHEST  1 VIEW COMPARISON:  05/10/2019, CT 05/10/2019 FINDINGS: Interim diffuse hazy opacity of the right thorax likely due to layering pleural effusion. Small left-sided pleural effusion. No central venous catheter is evident on the current image. Stable cardiomediastinal silhouette. Aortic atherosclerosis. IMPRESSION: 1. No definitive central venous catheter identified on the current image 2. Interim diffuse hazy opacity of the right thorax felt secondary to layering moderate pleural effusion noted on the CT. 3. Small left-sided pleural effusion. Electronically Signed   By: Donavan Foil M.D.   On: 05/10/2019 23:11   DG Chest 1 View  Result Date: 05/10/2019 CLINICAL DATA:  Aphasia. Generalized weakness. Basilar crackles. EXAM: CHEST  1 VIEW COMPARISON:  Radiograph 04/23/2019 FINDINGS: Improved patchy bibasilar opacities from prior exam. Minimal residual subsegmental atelectasis in the right. Small left pleural effusion that is new. Heart is normal in size with normal mediastinal contours. Aortic atherosclerosis. No pulmonary edema or pneumothorax. No acute osseous abnormalities are seen. IMPRESSION: New small left pleural effusion. Improved patchy bibasilar opacities from prior exam. Electronically Signed   By: Keith Rake M.D.   On: 05/10/2019 16:11   CT Head Wo Contrast  Result Date: 05/10/2019 CLINICAL DATA:  Altered mental  status EXAM: CT HEAD WITHOUT CONTRAST TECHNIQUE: Contiguous axial images were obtained from the base of the skull through the vertex without intravenous contrast. COMPARISON:  01/30/2019, 02/21/2019 FINDINGS: Brain: No evidence of acute infarction, hemorrhage, hydrocephalus, extra-axial collection or mass lesion/mass effect. Extensive low-density changes within the periventricular and subcortical white matter compatible with chronic microvascular ischemic change. Progressive low-density changes in the left deep cerebral white matter at site of previously seen acute infarcts on 02/21/2019. Moderate diffuse cerebral volume loss. Vascular: Atherosclerotic calcifications involving the large vessels of the skull base. No unexpected hyperdense vessel. Skull: Normal. Negative for fracture or focal lesion. Sinuses/Orbits: No acute finding. Other: None. IMPRESSION: 1.  No acute intracranial findings. 2. Advanced chronic microvascular ischemic change and cerebral volume loss. Electronically Signed   By: Davina Poke D.O.   On: 05/10/2019 16:16   CT Renal Stone Study  Result Date: 05/10/2019 CLINICAL DATA:  Hematuria EXAM: CT ABDOMEN AND PELVIS WITHOUT CONTRAST TECHNIQUE: Multidetector CT imaging of the abdomen and pelvis was performed following the standard protocol without IV contrast. COMPARISON:  MRI 04/24/2019 FINDINGS: Lower chest: lung bases demonstrate moderate pleural effusions. Dependent atelectasis at the bases. Normal heart size. Mitral calcification. Hepatobiliary: No focal hepatic abnormality. Increased density at the fundus and periphery of the gallbladder possibly due to stones or small sludge. No biliary dilatation Pancreas: Marked fatty atrophy.  No inflammatory change Spleen: Normal in size without focal abnormality. Adrenals/Urinary Tract: Adrenal glands are normal. Atrophic left kidney. Punctate stone in the lower pole of the left kidney. Small stone in the left renal pelvis. Negative for  hydronephrosis. The bladder is unremarkable Stomach/Bowel: The stomach is nonenlarged. No dilated small bowel. No bowel wall thickening. Negative appendix. Vascular/Lymphatic: Advanced aortic atherosclerosis. Diffuse enlargement of the aorta with focal dilatation up to 3.3 cm at the level of the renal arteries. No suspicious adenopathy. Reproductive: Uterus and bilateral adnexa are unremarkable. Other: Negative for free air or free fluid. Generalized edema within the subcutaneous soft tissues. Fluid within the left flank region. Musculoskeletal: Deep right sacral decubitus ulcer down to the sacrococcygeal region. No gross bony destruction in this region. Gas within the left hip joint with small effusion. Gas within the  soft tissues about the left hip. Intramuscular gas within the obturator internus and gluteus muscles on the left. IMPRESSION: 1. No CT evidence for acute intra-abdominal or intrapelvic abnormality. Negative for hydronephrosis or ureteral stone. Small stones in the atrophic left kidney. 2. Abnormal gas within the left hip joint and surrounding soft tissues with intramuscular gas also present, in the absence of recent instrumentation, findings raise concern for gas forming infection of the left hip. 3. Deep right para median sacral decubitus ulcer without adjacent bony changes of the sacrococcygeal region. 4. Possible sludge or stones in the gallbladder 5. 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 Recommend followup by ultrasound in 3 years. This recommendation follows ACR consensus guidelines: White Paper of the ACR Incidental Findings Committee II on Vascular Findings. Natasha [employer] Coll Radiol 2013; 10:789-794 Electronically Signed   By: Donavan Foil M.D.   On: 05/10/2019 21:49    Positive ROS: All other systems have been reviewed and were otherwise negative with the exception of those mentioned in the HPI and as above.  Physical Exam: See HPI for Dr. Jerene Canny report of exam  findings.  Assessment: Gas in the left hip and surrounding soft tissues concerning for gas producing infection  Plan: I have reviewed the patient's CT scan.  There is gas in the soft tissue around the hip, inside the hip joint as well as appearing along the inside of the left pelvis.  Patient has no hardware or history of surgery of or recent injury to the left hip.   The patient has had surgical debridement of a sacral ulcer two weeks ago.   The air seen on the CT scan today may represent a spreading gas producing infection from the patients sacral ulcer site.  If there is a possibility this may be a gas producing infection spreading to the patient's pelvis and hip, such as necrotizing fasciitis, I recommend the patient be transferred to a tertiary medical center for further evaluation and definitive care.  I have discussed this recommendation with Dr. Joni Fears who agrees and will contact the local tertiary medical centers for transfer.    Th[place]ton [place], MD    05/10/2019 11:20 PM

## 2019-05-10 NOTE — Telephone Encounter (Signed)
Entry for 05/09/19: Received message to call patient's daughter. Spoke with daughter who requested to have a visit scheduled with Palliative Care NP. Patient is bed bound, has a sacral wound requiring a wound vac. Patient continues to have pain. Visit scheduled with Amy NP for 05/10/19. Verbal consent obtained for Palliative care

## 2019-05-10 NOTE — ED Triage Notes (Signed)
Pt arrives from home via [facility] with complaint of lethargy since yesterday. Home health said sleep it off, but today she was not better.  Per family pt is usually mildly talkative, but today pt has not been. Pt has history of strokes, but a negative stroke screen per EMS. Pt is nonverbal on arrival

## 2019-05-10 NOTE — Progress Notes (Signed)
[facility] Consult Note Telephone: [phone]  Fax: [phone]  PATIENT NAME: Miranda Ryan DOB: 08/27/1945 MRN: [id number]  PRIMARY CARE PROVIDER:   [place]  REFERRING PROVIDER:  Referral upon hospital discharge  RESPONSIBLE PARTY:   Aurelio Jew, daughter [phone]     RECOMMENDATIONS and PLAN:  1.  Advanced care planning.  Patient is a DNR  2.  Dementia/CVA.  Patient is bedbound requiring total care.  Daughter states that she does require to be fed at times.  She is incontinent of B&B.  She is usually able to have a conversation and follow commands.  She has sacral wound requiring wound vac.  Was in hospital 3/1-04/27/19 for wound infection.  Patient receiving home health PT/OT/RN for wound care.  Daughter states that she would have days that patient would eat well and other days in which she would hardly eat anything.   Daughter states that yesterday morning she was at baseline but when RN came to do wound care she was more quiet and drowsy.   Today patient is nonengaging and stares off into space.  She will respond when her name is called and she will follow commands.  She does have right facial drooping, slurred mumbling speech, when asked to hold arms out she halfway raised her right arm but did not raise her left arm.  She does have left sided weakness related to previous CVA.  Was unable to get a BP reading, her HR started at 54 and jumped up to 110 and settled in the 80s.  Her O2 83% on RA. EMS called.  Daughter wanted her taken to hospital and she was taken to [facility].    Had lengthy discussion with daughter about her mother's decline and strong recommendation for hospice.  Answered daughter's questions about hospice.  She did say that hospice has been advised previously.  She is interested in placement for her mother in hospice home if appropriate.    I spent 90 minutes providing this consultation,  from 2:00 to 3:30  including time with patient/family, chart review, provider coordination, and documentation. More than 50% of the time in this consultation was spent coordinating communication.   HISTORY OF PRESENT ILLNESS:  Miranda Ryan is a 74 y.o. year old female with multiple medical problems including dementia, COPD, CKD stage 3, protein calorie malnutrition, h/o stoke. Palliative Care was asked to help address goals of care.   CODE STATUS: DNR  PPS: 20% HOSPICE ELIGIBILITY/DIAGNOSIS: TBD  PHYSICAL EXAM:  Unable to assess BP; HR was 54 jumped up to 110 and settled in the 80s.  O2 83% on RA General: frail appearing, thin Cardiovascular: regular rate and rhythm Pulmonary: clear ant fields Extremities: tace edema to feet, no joint deformities Skin: has sacral wound with wound vac in place Neurological: Patient has right facial droop, slurred mumbling speech; will follow commands and will look at provider when her name is called.  She stares off in space and is nonengaging   PAST MEDICAL HISTORY:  Past Medical History:  Diagnosis Date  . Acute CVA (cerebrovascular accident) ([place]) 02/22/2019  . Acute exacerbation of chronic obstructive pulmonary disease (COPD) ([place]) 04/20/2016  . Anxiety   . Bipolar disorder ([place])   . Dementia ([place])   . Depression   . Diabetes mellitus, type II ([place])   . Dysphagia 02/23/2019  . Incontinence of urine   . Low serum thyroid stimulating hormone (TSH) 02/23/2019  . Severe mitral  valve stenosis 02/23/2019    SOCIAL HX:  Social History   Tobacco Use  . Smoking status: Current Every Day Smoker    Packs/day: 1.00    Years: 46.00    Pack years: 46.00    Types: Cigarettes    Start date: 09/16/1964  . Smokeless tobacco: Never Used  Substance Use Topics  . Alcohol use: Not Currently    Alcohol/week: 0.0 standard drinks    Comment: occasionally    ALLERGIES:  Allergies  Allergen Reactions  . Demerol [Meperidine] Other (See Comments)    Pt states she "turned green"       PERTINENT MEDICATIONS:  Facility-Administered Encounter Medications as of 05/10/2019  Medication  . [COMPLETED] sodium chloride 0.9 % bolus 1,000 mL  . sodium chloride flush (NS) 0.9 % injection 10-40 mL  . sodium chloride flush (NS) 0.9 % injection 10-40 mL   Outpatient Encounter Medications as of 05/10/2019  Medication Sig  . acetaminophen (TYLENOL) 325 MG tablet Take 2 tablets (650 mg total) by mouth every 6 (six) hours as needed for mild pain (or Fever >/= 101).  Marland Kitchen acidophilus (RISAQUAD) CAPS capsule Take 1 capsule by mouth 3 (three) times daily.  . Amino Acids-Protein Hydrolys (FEEDING SUPPLEMENT, PRO-STAT SUGAR FREE 64,) LIQD Take 30 mLs by mouth daily.  Marland Kitchen ascorbic acid (VITAMIN C) 500 MG tablet Take 1 tablet (500 mg total) by mouth 2 (two) times daily.  Marland Kitchen aspirin EC 81 MG EC tablet Take 1 tablet (81 mg total) by mouth daily.  Marland Kitchen atorvastatin (LIPITOR) 40 MG tablet Take 1 tablet (40 mg total) by mouth daily at 6 PM.  . busPIRone (BUSPAR) 10 MG tablet Take 10 mg by mouth 2 (two) times daily.  . collagenase (SANTYL) ointment Apply topically 2 (two) times daily.  Marland Kitchen linagliptin (TRADJENTA) 5 MG TABS tablet Take 5 mg by mouth daily.  . metFORMIN (GLUCOPHAGE) 500 MG tablet Take 1 tablet (500 mg total) by mouth 2 (two) times daily with a meal.  . metoprolol tartrate (LOPRESSOR) 25 MG tablet Take 1 tablet (25 mg total) by mouth 2 (two) times daily.  . Multiple Vitamin (MULTIVITAMIN WITH MINERALS) TABS tablet Take 1 tablet by mouth daily.  . nicotine (NICODERM CQ - DOSED IN MG/24 HOURS) 21 mg/24hr patch Place 1 patch (21 mg total) onto the skin daily.  . pantoprazole (PROTONIX) 40 MG tablet Take 1 tablet (40 mg total) by mouth daily.  . polyvinyl alcohol (LIQUIFILM TEARS) 1.4 % ophthalmic solution Place 1 drop into both eyes 4 (four) times daily as needed for dry eyes.  . potassium chloride SA (KLOR-CON) 20 MEQ tablet Take 1 tablet (20 mEq total) by mouth daily for 1 day.  . traMADol  (ULTRAM) 50 MG tablet Take 1 tablet (50 mg total) by mouth every 6 (six) hours as needed for moderate pain.  Marland Kitchen zinc sulfate 220 (50 Zn) MG capsule Take 220 mg by mouth daily.     Regnald Bowens Jenetta Downer, NP

## 2019-05-11 LAB — URINE CULTURE: Culture: <10000 — AB

## 2019-05-11 LAB — TROPONIN I (HIGH SENSITIVITY): Troponin I (High Sensitivity): 25 ng/L — ABNORMAL HIGH (ref <18)

## 2019-05-11 LAB — RESPIRATORY PANEL BY RT PCR (FLU A&B, COVID)
Influenza A by PCR: NEGATIVE
Influenza B by PCR: NEGATIVE
SARS Coronavirus 2 by RT PCR: NEGATIVE

## 2019-05-11 LAB — LACTIC ACID, PLASMA: Lactic Acid, Venous: 2.2 mmol/L (ref 0.5–1.9)

## 2019-05-11 IMAGING — MR MR PELVIS W/O CM
4 series · 23 of 48 positions shown · non-contrast
Comparison: [DATE], [DATE]

CLINICAL DATA: Sacral cubitus ulcer

EXAM:
MRI PELVIS WITHOUT CONTRAST
TECHNIQUE: Multiplanar multisequence MR imaging of the pelvis was performed. No
intravenous contrast was administered.

[Series 4: T1 · coronal · 4.0mm · 0.85mm/px · 9 of 40 slices shown (1 of 2)]
[im 1/40]
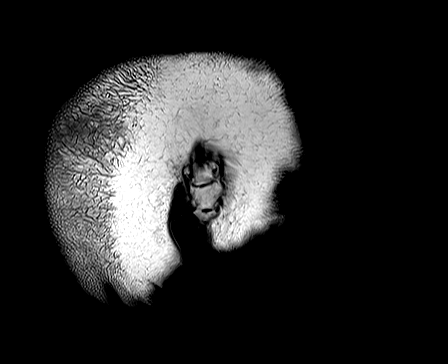
[im 7/40]
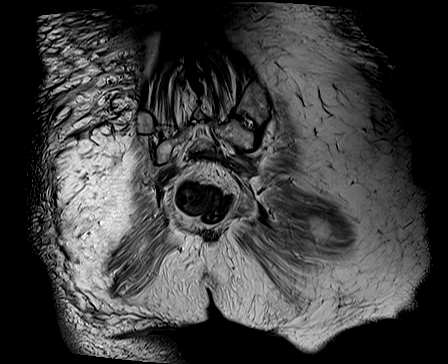
[im 14/40]
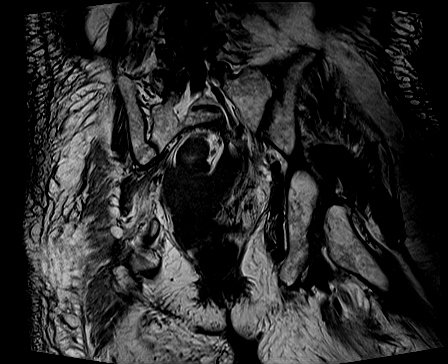
[im 17/40]
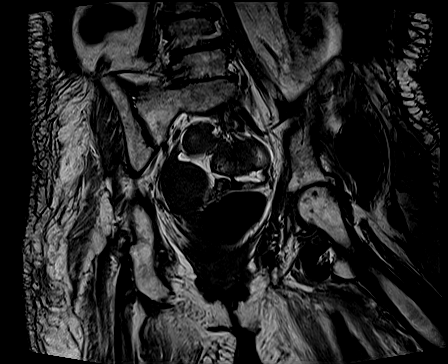
[im 20/40]
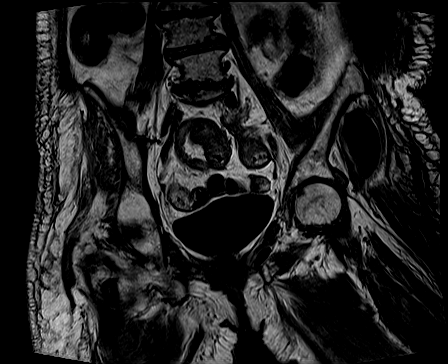
[im 23/40]
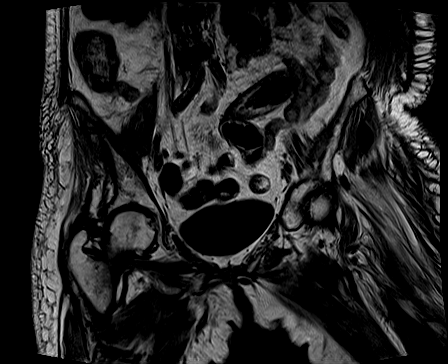
[im 27/40]
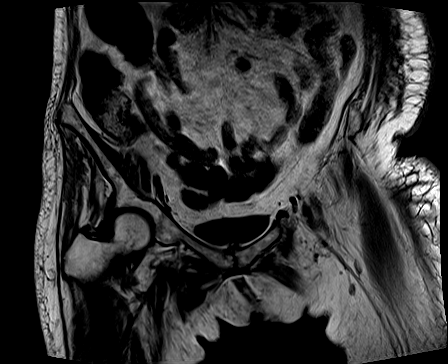
[im 33/40]
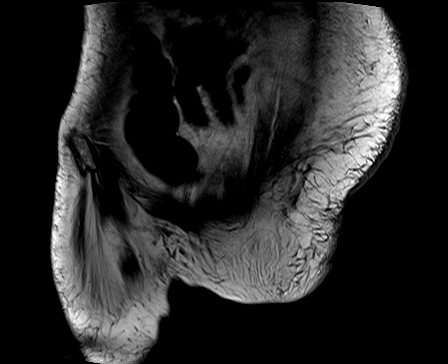
[im 40/40]
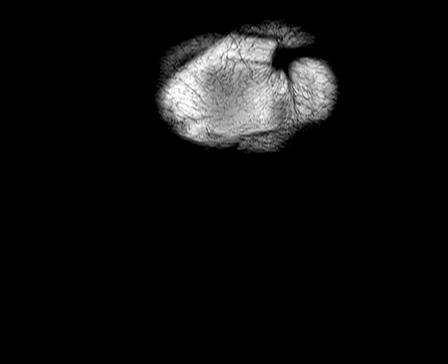

[Series 5: T1 · axial · 4.0mm · 0.74mm/px · z∈[-122,+32]mm · 8 of 36 slices shown (2 of 2)]
[im 1/36]
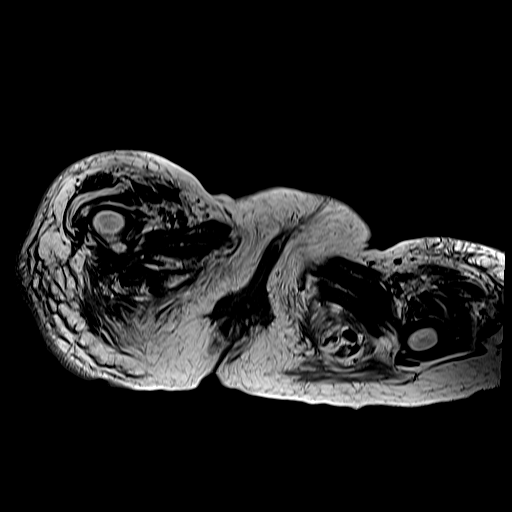
[im 4/36]
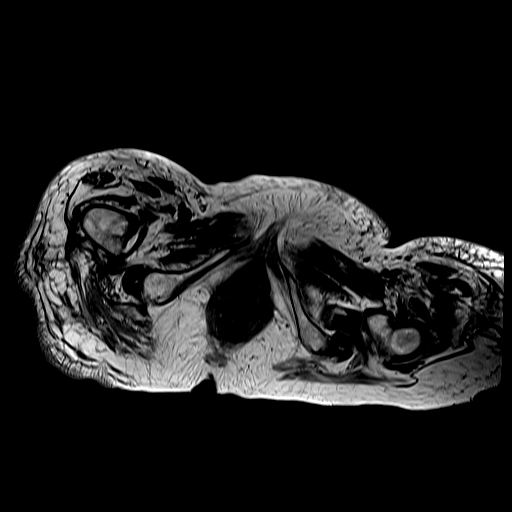
[im 8/36]
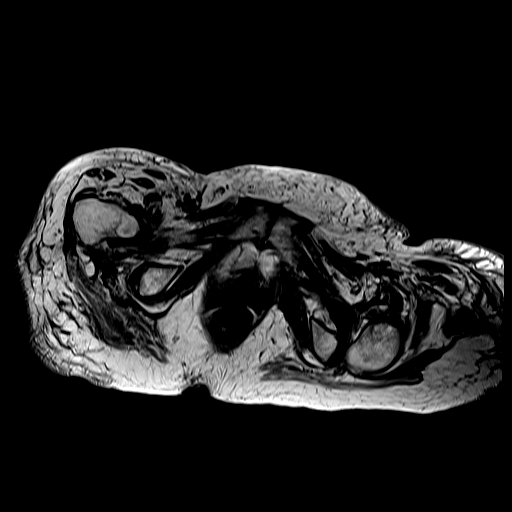
[im 11/36]
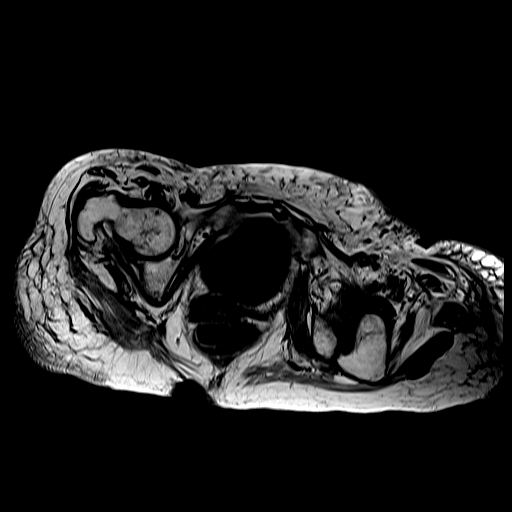
[im 15/36]
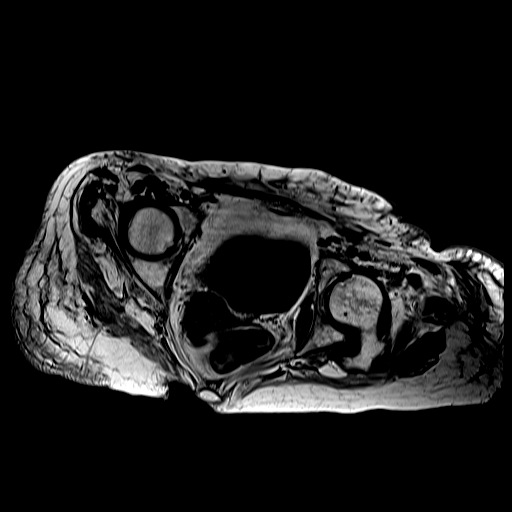
[im 18/36]
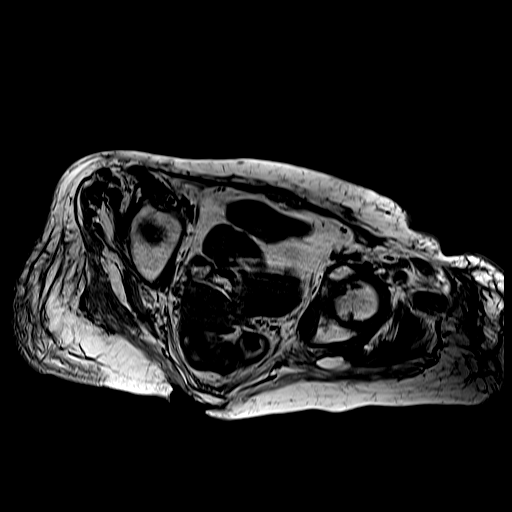
[im 22/36]
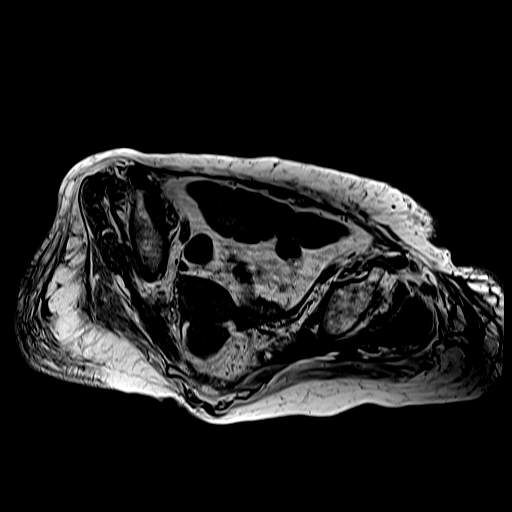
[im 32/36]
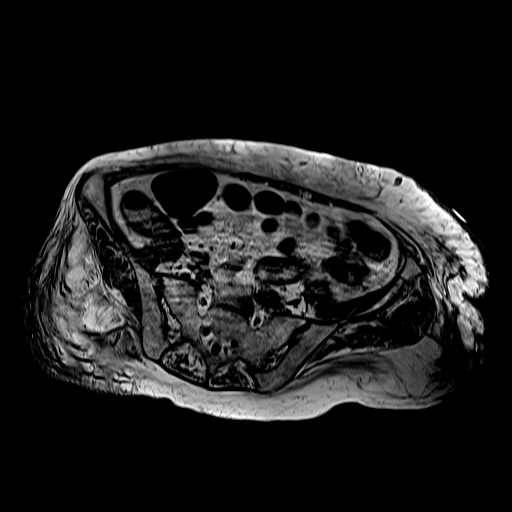

[Series 6: T2 fat-sat · axial · 4.0mm · 0.74mm/px · z∈[-98,+32]mm · 3 of 40 slices shown]
[im 7/40]
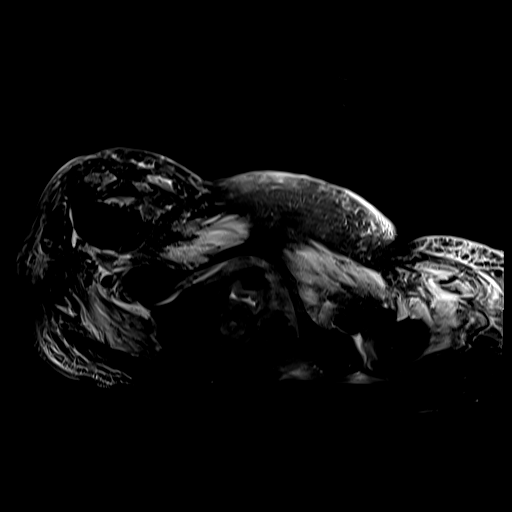
[im 20/40]
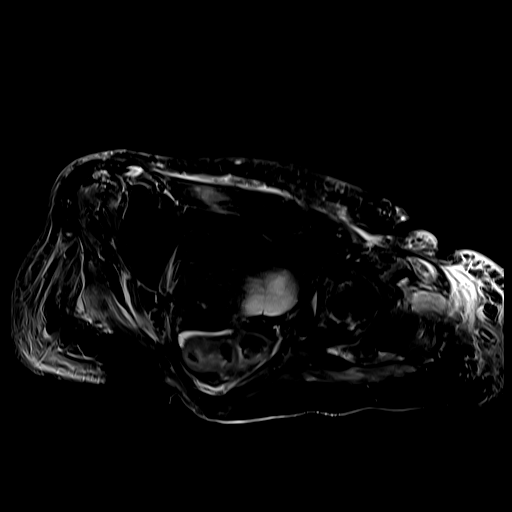
[im 33/40]
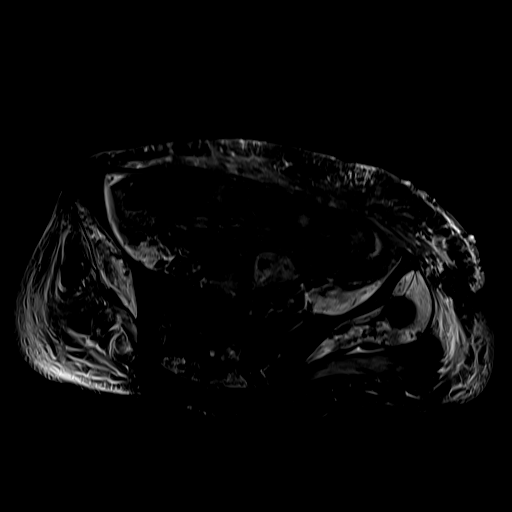

[Series 7: STIR · coronal · 4.0mm · 1.25mm/px · 3 of 35 slices shown]
[im 4/35]
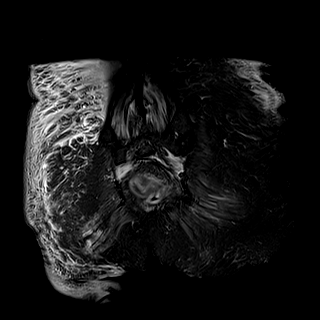
[im 18/35]
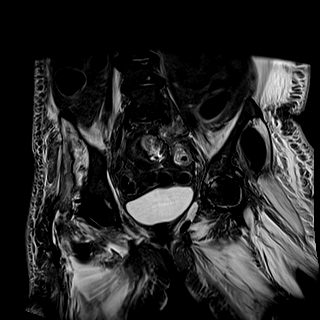
[im 31/35]
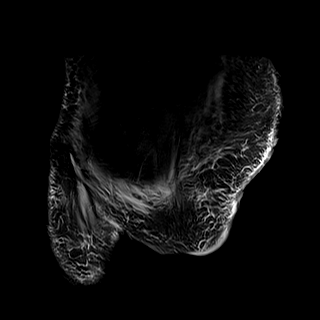

[23 of 48 positions shown; findings below may reference images not displayed]

FINDINGS: Extensive complex fluid and gas containing collection about the left
hip, as seen on recent CT. Intramuscular collections with
involvement of the left gluteal, piriformis, obturator internus and
externus muscles. Complex left joint effusion containing gas
compatible with septic arthritis. Marrow edema with associated low
T1 signal changes along the anteromedial aspect of the left femoral
head and opposing medial acetabulum compatible with osteomyelitis
(series 5, image 19). The area of signal abnormality is small with
preservation of fatty marrow signal within the remainder of the left
femoral head, neck, and visualized proximal femur. No SI joint
effusion.

Small amount of intrapelvic free fluid including a small gas
containing collection in the presacral space at approximately S2.
This appears contiguous with the larger gas and air containing
collection extending along the left piriform is muscle and into the
gluteal soft tissues (series 5, images 9-12; series 7, images 7-9).

Sacral decubitus ulcer along the right side of the distal sacrum
with the ulcer base closely approximating the cortex of the distal
sacrum. Mild periosteal edema (series 6, images 23-24) without
underlying bone marrow edema. There is preservation of the fatty T1
marrow signal. Evaluation of the sacrum is slightly limited as
patient was unable to tolerate any sagittal sequences.

Marked diffuse myositis throughout the bilateral pelvic musculature
and visualized proximal thigh musculature. Diffuse anasarca.
IMPRESSION: 1. Left hip septic arthritis with acute osteomyelitis involving a
small portion of the left femoral head and medial acetabulum.
2. Extensive complex intramuscular fluid and gas containing
collections with involvement of the left gluteal, piriformis, and
externus muscles. Collections extend into the presacral space of the
pelvis. Findings are compatible with aggressive gas-forming
infection.
3. Sacral decubitus ulcer along the right side of the distal sacrum
with the ulcer base closely approximating the cortex of the distal
sacrum. Mild periosteal edema without underlying bone marrow edema
is favored to represent reactive changes without definite
osteomyelitis.

These findings are in agreement with the preliminary report provided
by Dr. JUAMPII at [DATE] a.m. as documented in PACS. The emergent
results were called by telephone at the time of initial
interpretation by Dr. JUAMPII at [DATE] a.m. on [DATE] to Dr. JUAMPII,
who acknowledged the results.

## 2019-05-11 MED ORDER — CHLORHEXIDINE GLUCONATE 0.12 % MT SOLN
15.00 | OROMUCOSAL | Status: DC
Start: ? — End: 2019-05-11

## 2019-05-11 MED ORDER — NOREPINEPHRINE-SODIUM CHLORIDE 16-0.9 MG/250ML-% IV SOLN
0.00 | INTRAVENOUS | Status: DC
Start: ? — End: 2019-05-11

## 2019-05-11 MED ORDER — CHLORHEXIDINE GLUCONATE 0.12 % MT SOLN
15.00 | OROMUCOSAL | Status: DC
Start: 2019-05-16 — End: 2019-05-11

## 2019-05-11 MED ORDER — GENERIC EXTERNAL MEDICATION
Status: DC
Start: ? — End: 2019-05-11

## 2019-05-11 MED ORDER — SODIUM CHLORIDE FLUSH 0.9 % IV SOLN
20.00 | INTRAVENOUS | Status: DC
Start: 2019-05-16 — End: 2019-05-11

## 2019-05-11 MED ORDER — HEPARIN SODIUM (PORCINE) 5000 UNIT/ML IJ SOLN
5000.00 | INTRAMUSCULAR | Status: DC
Start: 2019-05-15 — End: 2019-05-11

## 2019-05-11 MED ORDER — SODIUM CHLORIDE 0.9 % IV SOLN
250.00 | INTRAVENOUS | Status: DC
Start: 2019-05-11 — End: 2019-05-11

## 2019-05-11 MED ORDER — BUSPIRONE HCL 5 MG PO TABS
10.00 | ORAL_TABLET | ORAL | Status: DC
Start: 2019-05-16 — End: 2019-05-11

## 2019-05-11 MED ORDER — ATORVASTATIN CALCIUM 40 MG PO TABS
40.00 | ORAL_TABLET | ORAL | Status: DC
Start: 2019-05-15 — End: 2019-05-11

## 2019-05-11 MED ORDER — GENERIC EXTERNAL MEDICATION
3.38 | Status: DC
Start: 2019-05-11 — End: 2019-05-11

## 2019-05-11 MED ORDER — MUPIROCIN 2 % EX OINT
TOPICAL_OINTMENT | CUTANEOUS | Status: DC
Start: 2019-05-16 — End: 2019-05-11

## 2019-05-11 MED ORDER — PANTOPRAZOLE SODIUM 40 MG PO TBEC
40.00 | DELAYED_RELEASE_TABLET | ORAL | Status: DC
Start: 2019-05-13 — End: 2019-05-11

## 2019-05-11 MED ORDER — ASPIRIN 81 MG PO TBEC
81.00 | DELAYED_RELEASE_TABLET | ORAL | Status: DC
Start: 2019-05-13 — End: 2019-05-11

## 2019-05-11 MED ORDER — SODIUM CHLORIDE FLUSH 0.9 % IV SOLN
20.00 | INTRAVENOUS | Status: DC
Start: ? — End: 2019-05-11

## 2019-05-11 MED ORDER — GENERIC EXTERNAL MEDICATION
20.00 | Status: DC
Start: 2019-05-11 — End: 2019-05-11

## 2019-05-11 MED ORDER — PROPOFOL 100 MG/10ML IV EMUL
0.00 | INTRAVENOUS | Status: DC
Start: ? — End: 2019-05-11

## 2019-05-11 NOTE — Consult Note (Signed)
Called by Dr. Beather Arbour the ER physician to inform me that attempts to transfer the patient to [facility] and [facility] in [place] were unsuccessful as they were full and could not accept the patient.  Dr. Beather Arbour requested that I evaluate the patient for emergency surgery given the evidence of gas in the left hip joint and surrounding soft tissue which appears to extend into the pelvis.    I examined the patient in room 4 in the ER.  Patient has a VAC dressing over her sacral ulcer.  There is no skin breakdown directly over the left hip.  There is no significant swelling over the left hip.  There is no erythema or ecchymosis seen.  Her thigh compartments are soft and compressible.  Distally she has palpable pedal pulses.  Patient did not exhibit any tenderness to palpation around the left hip or hip pain with with passive hip movement.    I spoke with the patient's daughter by phone tonight.  I explained to her that her mother has a serious infection.  She understands that her mother has a gas producing infection in the left hip and surrounding soft tissues and appears to extend into the pelvis by the CT and MRI images.  We discussed surgical debridement.  After my discussion with the patient's daughter, Dr. Beather Arbour informed me that the patient was excepted at [facility] in [place].  Dr. Beather Arbour has also informed the daughter of her mother's acceptance at [facility] and the patient's daughter has agreed to transfer her mother to [facility] for definitive management.  I discussed this case with Dr. Beather Arbour and Dr. Windell Moment the general surgeon on-call who also has been consulted on this patient.  We are all in agreement that transfer to a tertiary [facility] for management of this patient's infection is in the best interest of the patient.

## 2019-05-11 NOTE — ED Provider Notes (Signed)
-----------------------------------------   12:54 AM on 05/11/2019 -----------------------------------------  [facility] called back and they have no beds. Since both closest tertiary care facilities are at capacity, will discuss with orthopedic surgery, general surgery and hospitalist services to determine disposition.  Patient currently in MRI.  ----------------------------------------- 1:23 AM on 05/11/2019 -----------------------------------------  Have spoken with radiology regarding MRI pelvis which demonstrates necrotizing fasciitis, septic arthritis and gas in the left joint.  Spoke with Dr. Peyton Najjar from general surgery who requests I call Dr. Mack Guise from orthopedic surgery given involvement of the left hip joint.   ----------------------------------------- 1:30 AM on 05/11/2019 -----------------------------------------  Discussed case with Dr. Mack Guise who will evaluate the patient in the ED now.  He does still feel this case is too complex to stay at our community facility but acknowledges that [facility] could not take the patient due to capacity.  ----------------------------------------- 1:59 AM on 05/11/2019 -----------------------------------------  Have called patient's daughter [name] [phone] to update her on patient's serious condition and need for surgical intervention.  We discussed that [facility] and [facility] are full.  We discussed that Dr. Mack Guise is coming in to evaluate the patient and is willing to take her to the OR but due to the complex nature of her disease, would still recommend transfer to tertiary care facility.  Daughter has asked me to contact [facility].  I have contacted transfer center and am awaiting a callback.  ----------------------------------------- 2:14 AM on 05/11/2019 -----------------------------------------  Dr. Peyton Najjar evaluated patient at bedside.  Patient with AMS and does not seem to understand the  complexity of her situation. Daughter tells me she makes all of patient's medical decisions.  ----------------------------------------- 2:27 AM on 05/11/2019 -----------------------------------------  Patient accepted by Dr. Sabra Heck with [facility].  Dr. Mack Guise has evaluated patient at bedside.  He has also spoken with the daughter to give her more detail about the complexities of the patient's case.  I have just updated the daughter who understands that patient will be transferred to [facility].  ----------------------------------------- 4:27 AM on 05/11/2019 -----------------------------------------  [facility] flight crew at bedside to transport patient. Daughter notified by nurse.       Paulette Blanch, MD 05/11/19 [phone]

## 2019-05-11 NOTE — ED Notes (Signed)
Pt unable to sign d/t mental status and dementia hx.  Daughter, POA aware of transfer but is not here to physically sign.

## 2019-05-11 NOTE — ED Notes (Signed)
Pt transported to MRI 

## 2019-05-11 NOTE — Consult Note (Addendum)
SURGICAL CONSULTATION NOTE   HISTORY OF PRESENT ILLNESS (HPI):  74 y.o. female presented to [facility] ED for evaluation of altered mental status.  Patient unable to give history due to altered mental status.  Patient follow her gaze and not her head to simple question but cannot answer more complex question like the person illness.  History taken from ED physician.  Patient has been feeling weak and not talkative since yesterday.  Recent history of multiple surgical wound infections.  She was discharged 2 weeks ago.  She had cleansing and debridement.    At the ED she had labs that shows leukocytosis of 31,000.  This is an increase from 10,000 from 2 weeks ago.  This led to CT scan of the abdominal pelvis that shows gas around the left hip.  This led to pelvis MRI showing extensive edematous changes of the left hip with multiple foci of soft tissue gas along the obturator internus, external and abductor compartment as well as a quadratus femoris and gluteal muscle bellies.  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  As per radiologist highly concerning of aggressive septic arthritis of the left hip.  Surgery is consulted by Dr. 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  PAST MEDICAL HISTORY (PMH):  Past Medical History:  Diagnosis Date  . Acute CVA (cerebrovascular accident) ([place]) 02/22/2019  . Acute exacerbation of chronic obstructive pulmonary disease (COPD) ([place]) 04/20/2016  . Anxiety   . Bipolar disorder ([place])   . Dementia ([place])   . Depression   . Diabetes mellitus, type II ([place])   . Dysphagia 02/23/2019  . Incontinence of urine   . Low serum thyroid stimulating hormone (TSH) 02/23/2019  . Severe mitral valve stenosis 02/23/2019     PAST SURGICAL HISTORY [facility]):  Past Surgical History:  Procedure Laterality Date  . APPLICATION OF WOUND  VAC N/A 04/26/2019   Procedure: APPLICATION OF WOUND VAC;  Surgeon: Benjamine Sprague, DO;  Location: [facility] ORS;  Service: General;  Laterality: N/A;  . CERVIX SURGERY    . INCISION AND DRAINAGE OF WOUND N/A 04/26/2019   Procedure: IRRIGATION AND DEBRIDEMENT WOUND;  Surgeon: Benjamine Sprague, DO;  Location: [facility] ORS;  Service: General;  Laterality: N/A;     MEDICATIONS:  Prior to Admission medications   Medication Sig Start Date End Date Taking? Authorizing Provider  acetaminophen (TYLENOL) 325 MG tablet Take 2 tablets (650 mg total) by mouth every 6 (six) hours as needed for mild pain (or Fever >/= 101). 04/12/19  Yes Nolberto Hanlon, MD  acidophilus (RISAQUAD) CAPS capsule Take 1 capsule by mouth 3 (three) times daily. 04/12/19  Yes Nolberto Hanlon, MD  Amino Acids-Protein Hydrolys (FEEDING SUPPLEMENT, PRO-STAT SUGAR FREE 64,) LIQD Take 30 mLs by mouth daily.   Yes [provider]  atorvastatin (LIPITOR) 40 MG tablet Take 1 tablet (40 mg total) by mouth daily at 6 PM. 02/24/19  Yes Samuella Cota, MD  busPIRone (BUSPAR) 10 MG tablet Take 10 mg by mouth 2 (two) times daily.   Yes [provider]  linagliptin (TRADJENTA) 5 MG TABS tablet Take 5 mg by mouth daily.   Yes [provider]  metFORMIN (GLUCOPHAGE) 500 MG tablet Take 1 tablet (500 mg total) by mouth 2 (two) times daily with a meal. 02/24/19 02/24/20 Yes Samuella Cota, MD  metoprolol tartrate (LOPRESSOR) 25 MG  tablet Take 1 tablet (25 mg total) by mouth 2 (two) times daily. 04/27/19  Yes Swayze, Ava, DO  Multiple Vitamin (MULTIVITAMIN WITH MINERALS) TABS tablet Take 1 tablet by mouth daily. 04/13/19  Yes Nolberto Hanlon, MD  nicotine (NICODERM CQ - DOSED IN MG/24 HOURS) 21 mg/24hr patch Place 1 patch (21 mg total) onto the skin daily. 04/27/19  Yes Swayze, Ava, DO  pantoprazole (PROTONIX) 40 MG tablet Take 1 tablet (40 mg total) by mouth daily. 04/13/19  Yes Nolberto Hanlon, MD  polyvinyl alcohol (LIQUIFILM TEARS) 1.4 % ophthalmic solution  Place 1 drop into both eyes 4 (four) times daily as needed for dry eyes. 04/12/19  Yes Nolberto Hanlon, MD  traMADol (ULTRAM) 50 MG tablet Take 1 tablet (50 mg total) by mouth every 6 (six) hours as needed for moderate pain. 04/27/19  Yes Swayze, Ava, DO  zinc sulfate 220 (50 Zn) MG capsule Take 220 mg by mouth daily.   Yes [provider]  ascorbic acid (VITAMIN C) 500 MG tablet Take 1 tablet (500 mg total) by mouth 2 (two) times daily. 04/12/19   Nolberto Hanlon, MD  aspirin EC 81 MG EC tablet Take 1 tablet (81 mg total) by mouth daily. 02/25/19   Samuella Cota, MD  collagenase (SANTYL) ointment Apply topically 2 (two) times daily. 04/12/19   Nolberto Hanlon, MD  potassium chloride SA (KLOR-CON) 20 MEQ tablet Take 1 tablet (20 mEq total) by mouth daily for 1 day. 04/27/19 04/28/19  Swayze, Ava, DO     ALLERGIES:  Allergies  Allergen Reactions  . Demerol [Meperidine] Other (See Comments)    Pt states she "turned green"      SOCIAL HISTORY:  Social History   Socioeconomic History  . Marital status: Widowed    Spouse name: Not on file  . Number of children: Not on file  . Years of education: Not on file  . Highest education level: Not on file  Occupational History  . Not on file  Tobacco Use  . Smoking status: Current Every Day Smoker    Packs/day: 1.00    Years: 46.00    Pack years: 46.00    Types: Cigarettes    Start date: 09/16/1964  . Smokeless tobacco: Never Used  Substance and Sexual Activity  . Alcohol use: Not Currently    Alcohol/week: 0.0 standard drinks    Comment: occasionally  . Drug use: No  . Sexual activity: Not Currently  Other Topics Concern  . Not on file  Social History Narrative  . Not on file   Social Determinants of Health   Financial Resource Strain:   . Difficulty of Paying Living Expenses:   Food Insecurity:   . Worried About Charity fundraiser in the Last Year:   . Arboriculturist in the Last Year:   Transportation Needs:   . Lexicographer (Medical):   Marland Kitchen Lack of Transportation (Non-Medical):   Physical Activity:   . Days of Exercise per Week:   . Minutes of Exercise per Session:   Stress:   . Feeling of Stress :   Social Connections:   . Frequency of Communication with Friends and Family:   . Frequency of Social Gatherings with Friends and Family:   . Attends Religious Services:   . Active Member of Clubs or Organizations:   . Attends Archivist Meetings:   Marland Kitchen Marital Status:   Intimate Partner Violence:   . Fear of Current or  Ex-Partner:   . Emotionally Abused:   Marland Kitchen Physically Abused:   . Sexually Abused:       FAMILY HISTORY:  Family History  Problem Relation Age of Onset  . Liver cancer Mother   . Heart attack Father   . Heart disease Sister   . Heart disease Brother   . Heart disease Brother   . Heart disease Brother      REVIEW OF SYSTEMS:  Unable to complete review of system due to patient altered mental status.  As per history patient positive for altered mental status, weakness.  As per history from daughter patient without any nausea or vomiting.  VITAL SIGNS:  Temp:  [97.6 F (36.4 C)] 97.6 F (36.4 C) (03/18 1538) Pulse Rate:  [52-76] 76 (03/19 0036) Resp:  [16-17] 17 (03/19 0036) BP: (95)/(76) 95/76 (03/18 1518) SpO2:  [95 %-100 %] 95 % (03/19 0036) Weight:  [52.2 kg] 52.2 kg (03/18 1537)     Height: 5\' 3"  (160 cm) Weight: 52.2 kg BMI (Calculated): 20.38   INTAKE/OUTPUT:  This shift: Total I/O In: 1000 [IV Piggyback:1000] Out: -   Last 2 shifts: @IOLAST2SHIFTS @   PHYSICAL EXAM:  Constitutional:  --Generalized weakness, not oriented any time or place. Eyes:  -- Pupils equally round and reactive to light  -- No scleral icterus  Ear, nose, and throat:  -- No jugular venous distension  Pulmonary:  -- Bibasilar crackles  -- Equal breath sounds bilaterally -- Breathing non-labored at rest Cardiovascular:  -- S1, S2 present  -- No pericardial  rubs Gastrointestinal:  -- Abdomen soft, nontender, non-distended, no guarding or rebound tenderness -- No abdominal masses appreciated, pulsatile or otherwise  Musculoskeletal and Integumentary:  -- Wounds: Sacral ulcer with adequate wound VAC placed.  No purulent output from wound VAC -- Extremities: Bilateral lower extremity edema  Neurologic:  -- Motor function: Generalized weakness -- Sensation: Unable to reliable assess due to patient altered mental status   Labs:  CBC Latest Ref Rng & Units 05/10/2019 04/27/2019 04/26/2019  WBC 4.0 - 10.5 K/uL 31.5(H) 10.6(H) 9.6  Hemoglobin 12.0 - 15.0 g/dL 10.5(L) 9.5(L) 8.0(L)  Hematocrit 36.0 - 46.0 % 34.8(L) 31.3(L) 26.2(L)  Platelets 150 - 400 K/uL 474(H) 255 251   CMP Latest Ref Rng & Units 05/10/2019 04/27/2019 04/26/2019  Glucose 70 - 99 mg/dL 91 215(H) 139(H)  BUN 8 - 23 mg/dL 39(H) 22 24(H)  Creatinine 0.44 - 1.00 mg/dL 2.02(H) 0.98 1.06(H)  Sodium 135 - 145 mmol/L 138 136 135  Potassium 3.5 - 5.1 mmol/L 5.9(H) 3.7 3.8  Chloride 98 - 111 mmol/L 106 104 104  CO2 22 - 32 mmol/L 20(L) 23 23  Calcium 8.9 - 10.3 mg/dL 8.1(L) 7.5(L) 7.3(L)  Total Protein 6.5 - 8.1 g/dL 5.9(L) - -  Total Bilirubin 0.3 - 1.2 mg/dL 0.5 - -  Alkaline Phos 38 - 126 U/L 581(H) - -  AST 15 - 41 U/L 31 - -  ALT 0 - 44 U/L 35 - -    Imaging studies:  I personally evaluated the CT scan of the abdominal pelvis and MRI.  I was able to identify the large amount of gas in the left hip.  This is concerning of septic arthritis.  Assessment/Plan:  74 y.o. female with left hip septic arthritis with necrotizing fasciitis, complicated by pertinent comorbidities including COPD, chronic kidney disease stage III, history of stroke, anxiety, depression, reflux, history of anemia.  Patient with septic arthritis and necrotizing fasciitis.  MRI shows  gas of the left hip as described above.  I discussed the case with ED physician and orthopedic surgeon.  With current diagnosis patient  will need a very extensive surgery with exploration of the left hip and possible pelvis with debridement of the soft tissue.  Patient is very high risk for surgery.  From orthopedic surgery standpoint and general surgery standpoint patient will benefit of tertiary care due to the complexity of the surgery and postop care.  ED physician initially contacted [facility] which did not have any bed availability.  ED physician contacted daughter and she requested to be transferred to [place] accepted the case.  I think that this is the patient best alternative for her care.  Arnold Long, MD

## 2019-05-11 NOTE — ED Notes (Signed)
Pt is difficult stick and has had multiple IVs that have been pulled out and stick attempts prior.  First blood culture drawn from PICC line per Dr. Joni Fears request, lab called to draw second set.  Lab at bedside.

## 2019-05-13 MED ORDER — GENERIC EXTERNAL MEDICATION
Status: DC
Start: ? — End: 2019-05-13

## 2019-05-13 MED ORDER — GENERIC EXTERNAL MEDICATION
1.00 | Status: DC
Start: 2019-05-13 — End: 2019-05-13

## 2019-05-13 MED ORDER — SODIUM BICARBONATE 650 MG PO TABS
1300.00 | ORAL_TABLET | ORAL | Status: DC
Start: 2019-05-16 — End: 2019-05-13

## 2019-05-13 MED ORDER — DEXTROSE 10 % IV SOLN
125.00 | INTRAVENOUS | Status: DC
Start: ? — End: 2019-05-13

## 2019-05-13 MED ORDER — HYDROCORTISONE NA SUCCINATE PF 100 MG IJ SOLR
50.00 | INTRAMUSCULAR | Status: DC
Start: 2019-05-15 — End: 2019-05-13

## 2019-05-13 MED ORDER — FENTANYL CITRATE (PF) 50 MCG/ML IJ SOLN
25.00 | INTRAMUSCULAR | Status: DC
Start: ? — End: 2019-05-13

## 2019-05-13 MED ORDER — GLUCOSE 40 % PO GEL
15.00 | ORAL | Status: DC
Start: ? — End: 2019-05-13

## 2019-05-13 MED ORDER — GENERIC EXTERNAL MEDICATION
8.00 | Status: DC
Start: 2019-05-13 — End: 2019-05-13

## 2019-05-15 MED ORDER — METRONIDAZOLE 500 MG PO TABS
500.00 | ORAL_TABLET | ORAL | Status: DC
Start: 2019-05-15 — End: 2019-05-15

## 2019-05-15 MED ORDER — ASPIRIN 81 MG PO CHEW
81.00 | CHEWABLE_TABLET | ORAL | Status: DC
Start: 2019-05-15 — End: 2019-05-15

## 2019-05-15 MED ORDER — GENERIC EXTERNAL MEDICATION
100.00 | Status: DC
Start: 2019-05-16 — End: 2019-05-15

## 2019-05-15 MED ORDER — GENERIC EXTERNAL MEDICATION
Status: DC
Start: ? — End: 2019-05-15

## 2019-05-15 MED ORDER — GENERIC EXTERNAL MEDICATION
1.00 | Status: DC
Start: 2019-05-15 — End: 2019-05-15

## 2019-05-15 MED ORDER — GENERIC EXTERNAL MEDICATION
40.00 | Status: DC
Start: 2019-05-16 — End: 2019-05-15

## 2019-05-15 MED ORDER — GENERIC EXTERNAL MEDICATION
10.00 | Status: DC
Start: 2019-05-15 — End: 2019-05-15

## 2019-05-16 LAB — CULTURE, BLOOD (ROUTINE X 2)
Culture: NO GROWTH
Culture: NO GROWTH
Special Requests: ADEQUATE

## 2019-05-17 MED ORDER — POLYETHYLENE GLYCOL 3350 17 GM/SCOOP PO POWD
17.00 | ORAL | Status: DC
Start: ? — End: 2019-05-17

## 2019-05-17 MED ORDER — GENERIC EXTERNAL MEDICATION
Status: DC
Start: ? — End: 2019-05-17

## 2019-05-17 MED ORDER — CARBOXYMETHYLCELLULOSE SODIUM 0.5 % OP SOLN
1.00 | OPHTHALMIC | Status: DC
Start: ? — End: 2019-05-17

## 2019-05-17 MED ORDER — GLYCOPYRROLATE 0.4 MG/2ML IJ SOLN
0.20 | INTRAMUSCULAR | Status: DC
Start: ? — End: 2019-05-17

## 2019-05-17 MED ORDER — GENERIC EXTERNAL MEDICATION
25.00 | Status: DC
Start: ? — End: 2019-05-17

## 2019-05-17 MED ORDER — GENERIC EXTERNAL MEDICATION
Status: DC
Start: 2019-05-16 — End: 2019-05-17

## 2019-05-17 MED ORDER — FENTANYL CITRATE (PF) 50 MCG/ML IJ SOLN
0.00 | INTRAMUSCULAR | Status: DC
Start: ? — End: 2019-05-17

## 2019-05-17 MED ORDER — BISACODYL 10 MG RE SUPP
10.00 | RECTAL | Status: DC
Start: ? — End: 2019-05-17

## 2019-05-17 MED ORDER — COLLAGENASE 250 UNIT/GM EX OINT
TOPICAL_OINTMENT | CUTANEOUS | Status: DC
Start: 2019-05-16 — End: 2019-05-17

## 2019-05-17 MED ORDER — LORAZEPAM 2 MG/ML IJ SOLN
1.00 | INTRAMUSCULAR | Status: DC
Start: ? — End: 2019-05-17

## 2019-05-24 DEATH — deceased
